# Patient Record
Sex: Male | Born: 1940 | Race: White | Hispanic: No | Marital: Married | Smoking: Never smoker
Health system: Southern US, Community
[De-identification: ages and names within clinical notes are randomized; demographics above are authoritative.]

## PROBLEM LIST (undated history)

## (undated) DIAGNOSIS — R079 Chest pain, unspecified: Secondary | ICD-10-CM

## (undated) DIAGNOSIS — N183 Chronic kidney disease, stage 3 unspecified: Secondary | ICD-10-CM

## (undated) DIAGNOSIS — J986 Disorders of diaphragm: Secondary | ICD-10-CM

## (undated) DIAGNOSIS — M25559 Pain in unspecified hip: Secondary | ICD-10-CM

## (undated) DIAGNOSIS — M4800 Spinal stenosis, site unspecified: Secondary | ICD-10-CM

## (undated) DIAGNOSIS — I219 Acute myocardial infarction, unspecified: Secondary | ICD-10-CM

## (undated) DIAGNOSIS — I340 Nonrheumatic mitral (valve) insufficiency: Secondary | ICD-10-CM

## (undated) DIAGNOSIS — Z7952 Long term (current) use of systemic steroids: Secondary | ICD-10-CM

## (undated) DIAGNOSIS — E119 Type 2 diabetes mellitus without complications: Secondary | ICD-10-CM

## (undated) DIAGNOSIS — M069 Rheumatoid arthritis, unspecified: Secondary | ICD-10-CM

## (undated) DIAGNOSIS — G8929 Other chronic pain: Secondary | ICD-10-CM

## (undated) DIAGNOSIS — I2510 Atherosclerotic heart disease of native coronary artery without angina pectoris: Secondary | ICD-10-CM

## (undated) DIAGNOSIS — I482 Chronic atrial fibrillation, unspecified: Secondary | ICD-10-CM

## (undated) DIAGNOSIS — J984 Other disorders of lung: Secondary | ICD-10-CM

## (undated) DIAGNOSIS — I25810 Atherosclerosis of coronary artery bypass graft(s) without angina pectoris: Secondary | ICD-10-CM

## (undated) DIAGNOSIS — T460X1A Poisoning by cardiac-stimulant glycosides and drugs of similar action, accidental (unintentional), initial encounter: Secondary | ICD-10-CM

## (undated) DIAGNOSIS — E039 Hypothyroidism, unspecified: Secondary | ICD-10-CM

## (undated) DIAGNOSIS — I447 Left bundle-branch block, unspecified: Secondary | ICD-10-CM

## (undated) DIAGNOSIS — I255 Ischemic cardiomyopathy: Secondary | ICD-10-CM

## (undated) DIAGNOSIS — I5042 Chronic combined systolic (congestive) and diastolic (congestive) heart failure: Secondary | ICD-10-CM

## (undated) DIAGNOSIS — E785 Hyperlipidemia, unspecified: Secondary | ICD-10-CM

## (undated) DIAGNOSIS — E875 Hyperkalemia: Secondary | ICD-10-CM

## (undated) DIAGNOSIS — J869 Pyothorax without fistula: Secondary | ICD-10-CM

## (undated) DIAGNOSIS — K219 Gastro-esophageal reflux disease without esophagitis: Secondary | ICD-10-CM

## (undated) HISTORY — DX: Atherosclerotic heart disease of native coronary artery without angina pectoris: I25.10

## (undated) HISTORY — DX: Other disorders of lung: J98.4

## (undated) HISTORY — DX: Disorders of diaphragm: J98.6

## (undated) HISTORY — DX: Hyperlipidemia, unspecified: E78.5

## (undated) HISTORY — DX: Chronic combined systolic (congestive) and diastolic (congestive) heart failure: I50.42

## (undated) HISTORY — PX: KNEE ARTHROSCOPY: SHX127

## (undated) HISTORY — DX: Nonrheumatic mitral (valve) insufficiency: I34.0

## (undated) HISTORY — DX: Pyothorax without fistula: J86.9

## (undated) HISTORY — DX: Poisoning by cardiac-stimulant glycosides and drugs of similar action, accidental (unintentional), initial encounter: T46.0X1A

## (undated) HISTORY — PX: PATELLA FRACTURE SURGERY: SHX735

## (undated) HISTORY — DX: Atherosclerosis of coronary artery bypass graft(s) without angina pectoris: I25.810

---

## 1989-08-14 HISTORY — PX: CATARACT EXTRACTION W/ INTRAOCULAR LENS  IMPLANT, BILATERAL: SHX1307

## 1989-08-14 HISTORY — PX: CARPAL TUNNEL RELEASE: SHX101

## 1992-12-14 DIAGNOSIS — J984 Other disorders of lung: Secondary | ICD-10-CM

## 1992-12-14 HISTORY — DX: Other disorders of lung: J98.4

## 1992-12-14 HISTORY — PX: LUNG SURGERY: SHX703

## 1994-07-23 ENCOUNTER — Encounter: Payer: Self-pay | Admitting: Pulmonary Disease

## 1996-12-14 DIAGNOSIS — I255 Ischemic cardiomyopathy: Secondary | ICD-10-CM

## 1996-12-14 HISTORY — DX: Ischemic cardiomyopathy: I25.5

## 1997-05-14 HISTORY — PX: CORONARY ARTERY BYPASS GRAFT: SHX141

## 1998-09-16 ENCOUNTER — Observation Stay (HOSPITAL_COMMUNITY): Admission: AD | Admit: 1998-09-16 | Discharge: 1998-09-17 | Payer: Self-pay | Admitting: Cardiology

## 1998-10-09 ENCOUNTER — Ambulatory Visit (HOSPITAL_COMMUNITY): Admission: RE | Admit: 1998-10-09 | Discharge: 1998-10-10 | Payer: Self-pay | Admitting: Cardiology

## 1999-02-28 ENCOUNTER — Ambulatory Visit (HOSPITAL_COMMUNITY): Admission: RE | Admit: 1999-02-28 | Discharge: 1999-02-28 | Payer: Self-pay | Admitting: Cardiology

## 1999-08-15 HISTORY — PX: TOTAL KNEE ARTHROPLASTY: SHX125

## 1999-10-29 ENCOUNTER — Ambulatory Visit (HOSPITAL_COMMUNITY): Admission: RE | Admit: 1999-10-29 | Discharge: 1999-10-29 | Payer: Self-pay

## 1999-11-03 ENCOUNTER — Ambulatory Visit (HOSPITAL_COMMUNITY): Admission: RE | Admit: 1999-11-03 | Discharge: 1999-11-03 | Payer: Self-pay

## 2001-12-26 ENCOUNTER — Encounter: Payer: Self-pay | Admitting: Specialist

## 2001-12-26 ENCOUNTER — Ambulatory Visit (HOSPITAL_COMMUNITY): Admission: RE | Admit: 2001-12-26 | Discharge: 2001-12-27 | Payer: Self-pay | Admitting: Specialist

## 2002-01-10 ENCOUNTER — Inpatient Hospital Stay (HOSPITAL_COMMUNITY): Admission: RE | Admit: 2002-01-10 | Discharge: 2002-01-14 | Payer: Self-pay | Admitting: Specialist

## 2003-06-07 ENCOUNTER — Encounter (INDEPENDENT_AMBULATORY_CARE_PROVIDER_SITE_OTHER): Payer: Self-pay | Admitting: *Deleted

## 2003-06-07 ENCOUNTER — Ambulatory Visit (HOSPITAL_COMMUNITY): Admission: RE | Admit: 2003-06-07 | Discharge: 2003-06-07 | Payer: Self-pay | Admitting: *Deleted

## 2004-03-18 ENCOUNTER — Ambulatory Visit (HOSPITAL_COMMUNITY): Admission: RE | Admit: 2004-03-18 | Discharge: 2004-03-18 | Payer: Self-pay | Admitting: Cardiology

## 2004-12-04 ENCOUNTER — Ambulatory Visit (HOSPITAL_COMMUNITY): Admission: RE | Admit: 2004-12-04 | Discharge: 2004-12-04 | Payer: Self-pay | Admitting: Cardiology

## 2007-07-27 ENCOUNTER — Ambulatory Visit (HOSPITAL_COMMUNITY): Admission: RE | Admit: 2007-07-27 | Discharge: 2007-07-27 | Payer: Self-pay | Admitting: *Deleted

## 2007-08-03 ENCOUNTER — Ambulatory Visit (HOSPITAL_COMMUNITY): Admission: RE | Admit: 2007-08-03 | Discharge: 2007-08-04 | Payer: Self-pay | Admitting: *Deleted

## 2008-12-03 ENCOUNTER — Inpatient Hospital Stay (HOSPITAL_COMMUNITY): Admission: EM | Admit: 2008-12-03 | Discharge: 2008-12-06 | Payer: Self-pay | Admitting: Emergency Medicine

## 2008-12-05 HISTORY — PX: CARDIAC CATHETERIZATION: SHX172

## 2009-04-26 ENCOUNTER — Encounter: Admission: RE | Admit: 2009-04-26 | Discharge: 2009-04-26 | Payer: Self-pay | Admitting: Cardiovascular Disease

## 2009-04-30 ENCOUNTER — Ambulatory Visit (HOSPITAL_COMMUNITY): Admission: RE | Admit: 2009-04-30 | Discharge: 2009-04-30 | Payer: Self-pay | Admitting: Cardiovascular Disease

## 2009-04-30 HISTORY — PX: CARDIAC CATHETERIZATION: SHX172

## 2009-07-08 ENCOUNTER — Encounter: Payer: Self-pay | Admitting: Pulmonary Disease

## 2009-08-21 ENCOUNTER — Inpatient Hospital Stay (HOSPITAL_COMMUNITY): Admission: AD | Admit: 2009-08-21 | Discharge: 2009-08-24 | Payer: Self-pay | Admitting: Cardiovascular Disease

## 2010-01-21 ENCOUNTER — Encounter: Admission: RE | Admit: 2010-01-21 | Discharge: 2010-01-21 | Payer: Self-pay | Admitting: Cardiovascular Disease

## 2010-01-27 ENCOUNTER — Encounter: Payer: Self-pay | Admitting: Pulmonary Disease

## 2010-01-30 ENCOUNTER — Ambulatory Visit (HOSPITAL_COMMUNITY): Admission: RE | Admit: 2010-01-30 | Discharge: 2010-01-30 | Payer: Self-pay | Admitting: Cardiovascular Disease

## 2010-01-30 ENCOUNTER — Encounter: Payer: Self-pay | Admitting: Pulmonary Disease

## 2010-02-11 ENCOUNTER — Encounter: Payer: Self-pay | Admitting: Pulmonary Disease

## 2010-02-19 DIAGNOSIS — I25719 Atherosclerosis of autologous vein coronary artery bypass graft(s) with unspecified angina pectoris: Secondary | ICD-10-CM | POA: Insufficient documentation

## 2010-02-19 DIAGNOSIS — I4821 Permanent atrial fibrillation: Secondary | ICD-10-CM

## 2010-02-19 DIAGNOSIS — I10 Essential (primary) hypertension: Secondary | ICD-10-CM

## 2010-02-19 DIAGNOSIS — E785 Hyperlipidemia, unspecified: Secondary | ICD-10-CM

## 2010-02-20 ENCOUNTER — Ambulatory Visit: Payer: Self-pay | Admitting: Pulmonary Disease

## 2010-02-20 DIAGNOSIS — R0602 Shortness of breath: Secondary | ICD-10-CM

## 2010-02-20 DIAGNOSIS — I219 Acute myocardial infarction, unspecified: Secondary | ICD-10-CM | POA: Insufficient documentation

## 2010-02-20 LAB — CONVERTED CEMR LAB: Pro B Natriuretic peptide (BNP): 455 pg/mL — ABNORMAL HIGH (ref 0.0–100.0)

## 2010-02-21 ENCOUNTER — Ambulatory Visit: Payer: Self-pay | Admitting: Cardiology

## 2010-02-27 ENCOUNTER — Encounter: Payer: Self-pay | Admitting: Pulmonary Disease

## 2010-03-14 ENCOUNTER — Telehealth (INDEPENDENT_AMBULATORY_CARE_PROVIDER_SITE_OTHER): Payer: Self-pay | Admitting: *Deleted

## 2010-03-28 ENCOUNTER — Inpatient Hospital Stay (HOSPITAL_COMMUNITY): Admission: RE | Admit: 2010-03-28 | Discharge: 2010-04-02 | Payer: Self-pay | Admitting: Cardiovascular Disease

## 2010-03-28 HISTORY — PX: CARDIAC CATHETERIZATION: SHX172

## 2010-04-01 HISTORY — PX: CARDIAC CATHETERIZATION: SHX172

## 2010-04-11 ENCOUNTER — Ambulatory Visit: Payer: Self-pay | Admitting: Pulmonary Disease

## 2010-05-28 ENCOUNTER — Inpatient Hospital Stay (HOSPITAL_COMMUNITY): Admission: EM | Admit: 2010-05-28 | Discharge: 2010-05-30 | Payer: Self-pay | Admitting: Emergency Medicine

## 2010-09-08 IMAGING — CR DG CHEST 1V PORT
1 series · 1 of 1 positions shown · non-contrast
Comparison: 08/03/2007.

CLINICAL DATA: Chest pain.  Shortness of breath.

PORTABLE CHEST - 1 VIEW

[AP]
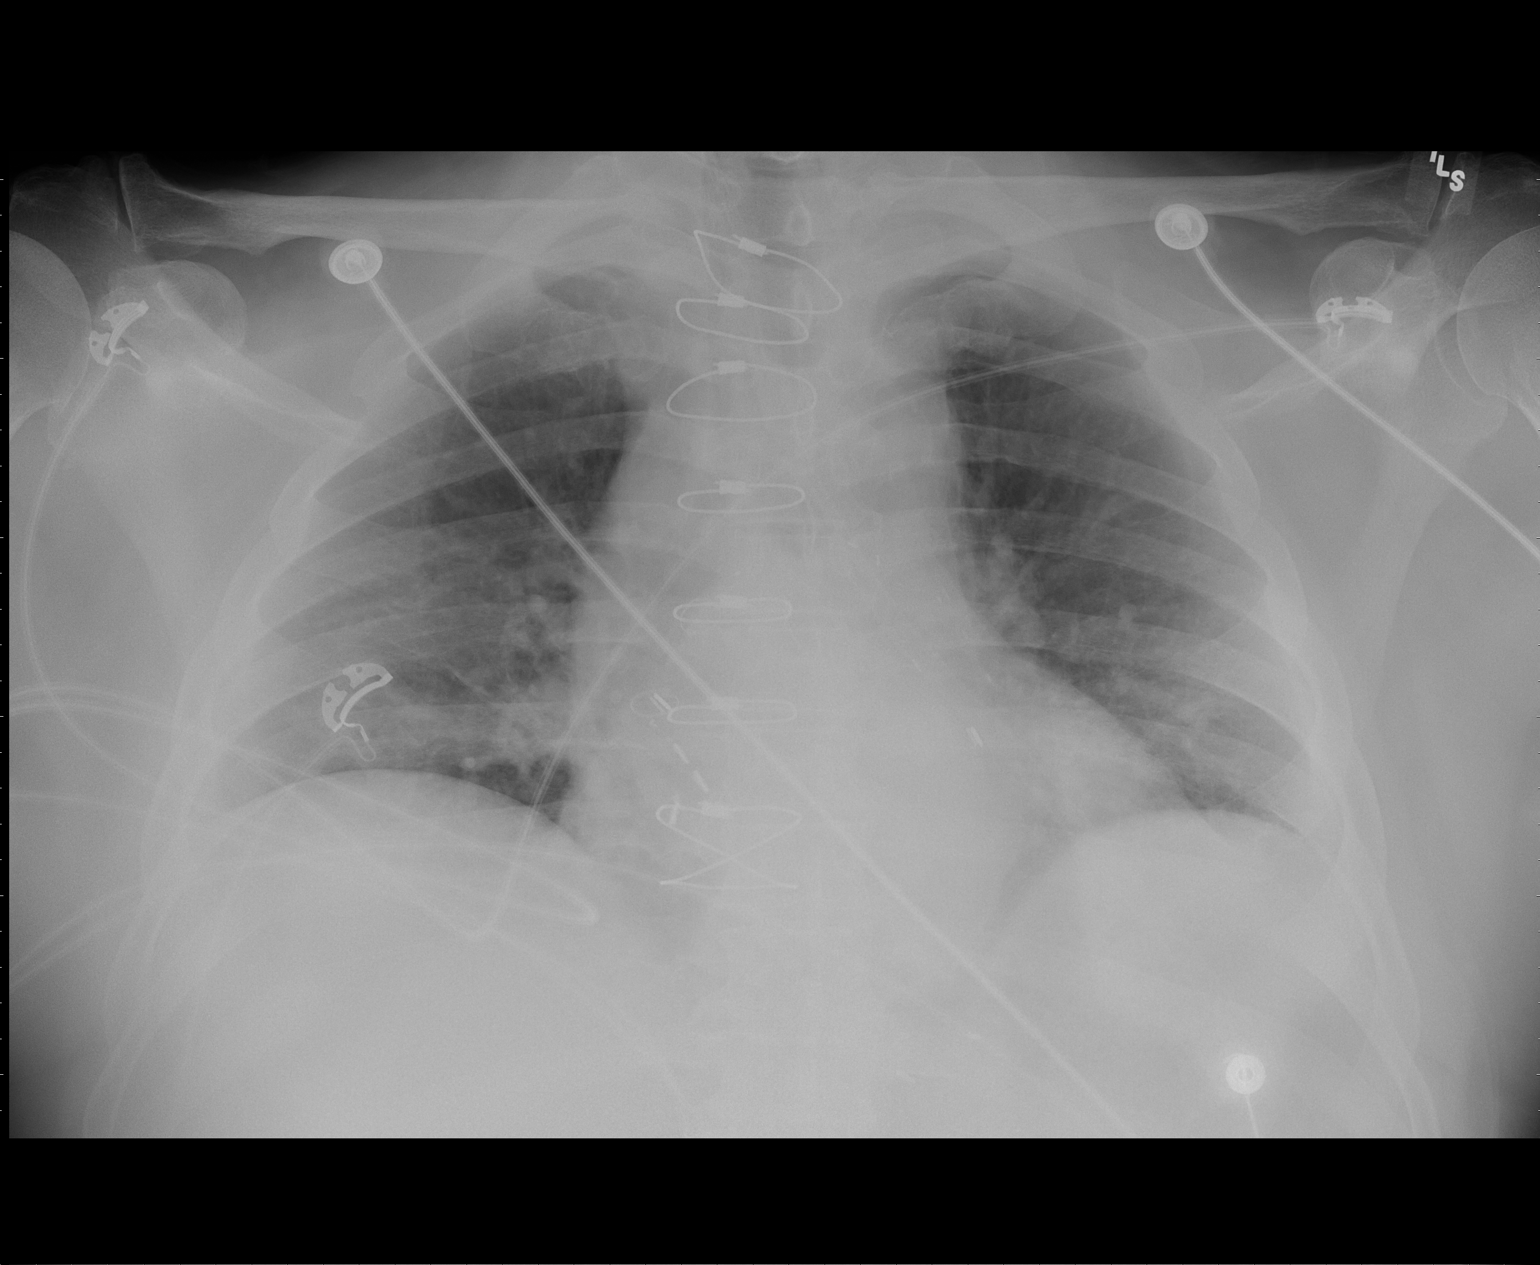

[1 of 1 positions shown; findings below may reference images not displayed]

FINDINGS: Lung volumes are low.  This exaggerates the heart size.
Mild cardiomegaly is present.  The patient is status post median
sternotomy for CABG.  Mild pulmonary vascular congestion is seen
without definite edema.  There is mild bibasilar atelectasis.
IMPRESSION: 1.  Low lung volumes.
2.  Cardiomegaly and mild bibasilar atelectasis.
3.  Pulmonary vascular congestion without frank edema.

## 2010-11-21 ENCOUNTER — Encounter: Payer: Self-pay | Admitting: Internal Medicine

## 2010-12-03 ENCOUNTER — Ambulatory Visit: Payer: Self-pay | Admitting: Internal Medicine

## 2010-12-03 ENCOUNTER — Encounter: Payer: Self-pay | Admitting: Internal Medicine

## 2011-01-15 NOTE — Progress Notes (Signed)
Summary: talk to nurse  Phone Note Call from Patient   Caller: Patient Call For: clance Summary of Call: dr clance have not called to discuss action that  he will be taking since test have been completed.  Initial call taken by: Rickard Patience,  March 14, 2010 9:44 AM  Follow-up for Phone Call        pt recently saw [facility] 02-20-2010.  Pt instructions state: 1)  will check your oxygen level overnight 2)  will check scan of your chest looking for "scar tissue" 3)  will check bloodwork today 4)  I will arrange followup once results are available.  ONO was done and is scanned into pt's chart. pt aware of results of ONO.  Pt also had CT chest 02-27-2010 and KC documented.... "have spoken with pt about ct.  no evidence for isld or pf he has tiny 3mm nodule in LUL, but is low risk will speak with Dr. [name] about possible right heart cath."  pt calling wanting to know what KC's next plans are.  Please advise.  Thanks. Aundra Millet Reynolds LPN  March 14, 1609 10:00 AM   Additional Follow-up for Phone Call Additional follow up Details #1::        i have been trying to get in touch with Dr. [name], and have not been successful.  will try again, and if no success will discuss with another cardiologist in that office Additional Follow-up by: Barbaraann Share MD,  March 14, 2010 5:31 PM    Additional Follow-up for Phone Call Additional follow up Details #2::    Spoke with pt and made aware of KC's response.  Pt verbalized understanding. Follow-up by: Vernie Murders,  March 14, 2010 5:39 PM   Appended Document: talk to nurse spoke with dr. [name].  He will call pt to arrange right heart cath. discussed with pt.

## 2011-01-15 NOTE — Miscellaneous (Signed)
Summary: ONO ok  Clinical Lists Changes  ONO on room air shows low sat of 85%, and he spent less than 2 min  less than or equal to sat of 88%. therefore, does not need nocturnal oxygen.  Appended Document: ONO ok please let pt know that oxygen level at night is ok.  Appended Document: ONO ok called and spoke with pt.  pt aware of ONO results.

## 2011-01-15 NOTE — Assessment & Plan Note (Signed)
Summary: consult for dyspnea   Copy to:  Nanetta Batty Primary Provider/Referring Provider:  Phylliss Bob  CC:  Pulmonary Consult.  History of Present Illness: The pt is a 70y/o male who I have been asked to see for dyspnea.  He has a h/o nonspecific pleuritis in 1994 with nondiagnostic vats biopsy, and was treated for possible autoimmune disease.  He is still taking methotrexate at this time.  He has been referred for evaluation of sob that occurs at exertion or sob, and has worsened over the past one year.  He describes a one block or less doe at a moderate pace, and will get winded bringing groceries in from the car.  He feels that he cannot get a deep breath, but denies any pleuritic chest pain.  He denies any cough or congestion.  His recent cxr shows poor depth inspiration, with chronically elevated left hemidiaphragm.  HIs pfts last month show no signficant obstruction, severe restriction, and a mild decrease in dlco.  He has had an echo last month with normal EF, mildly dilated LA, mild MR, and RV estimated pressure of 30-38mm.  His cath last year showed stable chronic CAD.  He has been on amiodarone in the past, and his medication was stopped the beginning of this month.  He is unable to tell when his rhythm is normal or abnormal.  He has noted worsening LE edema over the past 2 weeks, but his weight is neutral for over one year.    Preventive Screening-Counseling & Management  Alcohol-Tobacco     Smoking Status: never  Current Medications (verified): 1)  Prednisone 5 Mg Tabs (Prednisone) .... Take 2 Tabs By Mouth Daily 2)  Furosemide 40 Mg Tabs (Furosemide) .... Take 1 Tablet By Mouth Once A Day 3)  Klor-Con M20 20 Meq Cr-Tabs (Potassium Chloride Crys Cr) .... Take 1 Tablet By Mouth Once A Day 4)  Isosorbide Mononitrate Cr 60 Mg Xr24h-Tab (Isosorbide Mononitrate) .... Take 1 Tablet By Mouth Two Times A Day 5)  Methotrexate 2.5 Mg Tabs (Methotrexate Sodium) .... Take 6 Tabs By Mouth Each  Week 6)  Synthroid 150 Mcg Tabs (Levothyroxine Sodium) .... Take 1 Tablet By Mouth Once A Day 7)  Plavix 75 Mg Tabs (Clopidogrel Bisulfate) .... Take 1 Tablet By Mouth Once A Day 8)  Simvastatin 20 Mg Tabs (Simvastatin) .... Take 1 Tablet By Mouth Once A Day 9)  Metoprolol Tartrate 25 Mg Tabs (Metoprolol Tartrate) .... Take 1 Tablet By Mouth Two Times A Day 10)  Cyclobenzaprine Hcl 10 Mg Tabs (Cyclobenzaprine Hcl) .... Take 1 Tablet By Mouth Once A Day 11)  Aspirin Low Dose 81 Mg Tabs (Aspirin) .... Take 1 Tablet By Mouth Once A Day 12)  Folic Acid 400 Mcg Tabs (Folic Acid) .... Take 1 Tablet By Mouth Once A Day 13)  Fish Oil 1000 Mg Caps (Omega-3 Fatty Acids) .... Take 6 Tabs By Mouth Daily 14)  Ranexa 500 Mg Xr12h-Tab (Ranolazine) .... Take 1 Tablet By Mouth Two Times A Day 15)  Coumadin 10 Mg Tabs (Warfarin Sodium) .... Take As Directed 16)  Diltiazem Hcl Er Beads 180 Mg Xr24h-Cap (Diltiazem Hcl Er Beads) .... Take 1 Tablet By Mouth Once A Day 17)  Calcium 600 Mg Tabs (Calcium) .... Take 1 Tablet By Mouth Once A Day  Allergies (verified): 1)  ! Lisinopril 2)  ! Biaxin  Past History:  Past Medical History: H/o nonspecific pleuritis with negative vats biopsy  MYOCARDIAL INFARCTION (ICD-410.90) HYPERTENSION (ICD-401.9) DYSLIPIDEMIA (ICD-272.4)  CORONARY ARTERY DISEASE (ICD-414.00) ATRIAL FIBRILLATION, HX OF (ICD-V12.59) ISCHEMIC HEART DISEASE (ICD-414.9)  ?RA--sees [facility].  Past Surgical History: bypass grafting 1998 lung surgery 1994 B knee surgery  B carpal tunnel surgery B cataract removal   Family History: Reviewed history and no changes required. heart disease: father (CHF), mother, brother, sister rheumatism: son, mother, brother cancer: brother (unsure what kind), son (skin)   Social History: Reviewed history from 02/19/2010 and no changes required. pt is married. Patient never smoked.  pt is a retired Curator.  Smoking Status:  never  Vital Signs:  Patient  profile:   70 year old male Height:      72 inches Weight:      232.50 pounds BMI:     31.65 O2 Sat:      96 % on Room air Temp:     97.5 degrees F oral Pulse rate:   95 / minute BP sitting:   124 / 72  (right arm) Cuff size:   large  Vitals Entered By: Arman Filter LPN (February 20, 2010 10:47 AM)  O2 Flow:  Room air  Serial Vital Signs/Assessments:  Comments: 11:41 AM Ambulatory Pulse Oximetry  Resting; HR__70___    02 Sat__94%ra___  Lap1 (185 feet)   HR_123____   02 Sat___95%ra__ Lap2 (185 feet)   HR__126___   02 Sat___94%ra__    Lap3 (185 feet)   HR_____   02 Sat_____  ___Test Completed without Difficulty _X__Test Stopped due to: pt c/o increased sob.   Aundra Millet Reynolds LPN  February 20, 2010 11:59 AM    By: Arman Filter LPN   CC: Pulmonary Consult Comments Medications reviewed with patient Arman Filter LPN  February 20, 2010 10:48 AM    Physical Exam  General:  ow male in nad Eyes:  PERRLA and EOMI.   Nose:  patent without discharge Mouth:  mild elongation of soft palate and uvula Neck:  no jvd, tmg, LN Lungs:  decreased bs left base, bibasilar crackles. no wheezing or rhonchi Heart:  rrr, no mrg Abdomen:  soft and nontender, bs+ Extremities:  minimal ankle edema, no cyanosis pulses intact distally Neurologic:  alert and oriented, moves all 4.   Impression & Recommendations:  Problem # 1:  DYSPNEA (ICD-786.05) The pt has multiple possible etiologies for his dypsnea.  He has known CAD and afib, he has been on amiodarone recently, he has known autoimmune disease that may be causing inflammatory lung disease, he has evidence for elevated pulmonary artery pressures that may be related to autoimmune disease or from left sided pressure overload, he is overweight and deconditioned, and finally has underlying thyroid disease with control issues in the past.  His cxr recently shows no obvious ISLD, but I think he needs a HRCT to evaluate for occult fibrosis and  alveolitis.  He does not desaturate today with brisk walking, but does get tachycardic.  His pfts show primarily restriction, and his cxr has always shown small lung volumes/shallow depth of inspiration (some of which is postop in the case of the left hemidiaphragm).  Will check sed rate, BNP, ct chest, and overnight oximetry.  He may need right heart cath is nothing is found to evaluate right and left sided pressures.  Medications Added to Medication List This Visit: 1)  Furosemide 40 Mg Tabs (Furosemide) .... Take 1 tablet by mouth once a day 2)  Calcium 600 Mg Tabs (Calcium) .... Take 1 tablet by mouth once a day  Other Orders: Consultation Level IV (65784) DME  Referral (DME) Radiology Referral (Radiology) TLB-BNP (B-Natriuretic Peptide) (83880-BNPR) TLB-Sedimentation Rate (ESR) (85652-ESR)  Patient Instructions: 1)  will check your oxygen level overnight 2)  will check scan of your chest looking for "scar tissue" 3)  will check bloodwork today 4)  I will arrange followup once results are available.

## 2011-01-15 NOTE — Letter (Signed)
Summary: [facility]   Imported By: Lester [place] 03/06/2010 10:53:18  _____________________________________________________________________  External Attachment:    Type:   Image     Comment:   External Document

## 2011-01-15 NOTE — Assessment & Plan Note (Signed)
Summary: ov to discuss test results/mg   Copy to:  Nanetta Batty Primary Provider/Referring Provider:  Shary Decamp [facility])  CC:  Pt is here to discuss test results.  .  History of Present Illness: The pt comes in today for discussion of his various test results, as part of a w/u for dyspnea.  He has known chronic left hemidiaphragm elevation that I suspect is due to paralysis from prior surgeries, with restriction on his pfts.  He has had right heart cath which showed PCWP 18, very mild pulmonary hypertension with PVR less than one wood unit (therefore pulmonary venous rather than arterial), and a left heart cath which showed a decline in the pt's EF in one year from 50% to 35%.  The pt has also had a HRCT with no evidence for ISLD or other abnormality.  I have gone over the above studies in detail with the pt and his wife.  Current Medications (verified): 1)  Prednisone 5 Mg Tabs (Prednisone) .... Take 2 Tabs By Mouth Daily 2)  Furosemide 20 Mg Tabs (Furosemide) .... Take 1 Tablet By Mouth Once A Day 3)  Isosorbide Mononitrate Cr 60 Mg Xr24h-Tab (Isosorbide Mononitrate) .... Take 1 Tablet By Mouth Once A Day 4)  Methotrexate 2.5 Mg Tabs (Methotrexate Sodium) .... Take 6 Tabs By Mouth Each Week 5)  Synthroid 150 Mcg Tabs (Levothyroxine Sodium) .... Take 1 Tablet By Mouth Once A Day 6)  Plavix 75 Mg Tabs (Clopidogrel Bisulfate) .... Take 1 Tablet By Mouth Once A Day 7)  Simvastatin 20 Mg Tabs (Simvastatin) .... Take 1 Tablet By Mouth Once A Day 8)  Metoprolol Tartrate 25 Mg Tabs (Metoprolol Tartrate) .... Take 1 Tablet By Mouth Two Times A Day 9)  Cyclobenzaprine Hcl 10 Mg Tabs (Cyclobenzaprine Hcl) .... Take 1 Tablet By Mouth Once A Day 10)  Aspirin Low Dose 81 Mg Tabs (Aspirin) .... Take 1 Tablet By Mouth Once A Day 11)  Folic Acid 400 Mcg Tabs (Folic Acid) .... Take 1 Tablet By Mouth Once A Day 12)  Fish Oil 1000 Mg Caps (Omega-3 Fatty Acids) .... Take 3  Tabs By Mouth  Daily 13)  Ranexa 500 Mg Xr12h-Tab (Ranolazine) .... Take 1 Tablet By Mouth Two Times A Day 14)  Coumadin 10 Mg Tabs (Warfarin Sodium) .... Take As Directed 15)  Diltiazem Hcl Er Beads 180 Mg Xr24h-Cap (Diltiazem Hcl Er Beads) .... Take 1 Tablet By Mouth Once A Day 16)  Calcium 600 Mg Tabs (Calcium) .... Take 1 Tablet By Mouth Once A Day  Allergies (verified): 1)  ! Lisinopril 2)  ! Biaxin  Vital Signs:  Patient profile:   70 year old male Height:      72 inches Weight:      232.38 pounds BMI:     31.63 O2 Sat:      97 % on Room air Pulse rate:   81 / minute BP sitting:   106 / 62  (left arm) Cuff size:   regular  Vitals Entered By: Arman Filter LPN (April 11, 2010 11:47 AM)  O2 Flow:  Room air CC: Pt is here to discuss test results.   Comments Medications reviewed with patient Arman Filter LPN  April 11, 2010 11:47 AM    Physical Exam  General:  30 male in nad   Impression & Recommendations:  Problem # 1:  DYSPNEA (ICD-786.05)  I think the pt's dyspnea is due to his obesity, deconditioning, dysfunctional left hemidiaphragm, new cardiomyopathy,  and mildly elevated left sided filling pressures.  He does not have any evidence of airways disease or intrinsic lung disease.  He is also concerned about his thyroid medications, and thinks this is contributing as well.  He will need to discuss all of this with his primary md.  I have nothing to offer him for treatment from a pulmonary standpoint.  Time spent with pt today was .  Medications Added to Medication List This Visit: 1)  Furosemide 20 Mg Tabs (Furosemide) .... Take 1 tablet by mouth once a day 2)  Isosorbide Mononitrate Cr 60 Mg Xr24h-tab (Isosorbide mononitrate) .... Take 1 tablet by mouth once a day 3)  Fish Oil 1000 Mg Caps (Omega-3 fatty acids) .... Take 3  tabs by mouth daily  Other Orders: Est. Patient Level IV (40981)  Patient Instructions: 1)  will send a note to your primary md and Dr. [name]  outlining my findings. 2)  work on weight loss and conditioning. 3)  would get an apptm with your primary md to discuss your thyroid medicine, and cardiac issues.

## 2011-01-15 NOTE — Procedures (Signed)
Summary: [facility]  [facility]   Imported By: Lester [place] 03/04/2010 10:37:48  _____________________________________________________________________  External Attachment:    Type:   Image     Comment:   External Document

## 2011-01-15 NOTE — Letter (Signed)
Summary: [facility]  [facility]   Imported By: Lester [place] 03/06/2010 10:55:57  _____________________________________________________________________  External Attachment:    Type:   Image     Comment:   External Document

## 2011-01-15 NOTE — Assessment & Plan Note (Signed)
Summary: nep/afib/eval for ablation   Visit Type:  Initial Consult Referring Provider:  Nanetta Batty Primary Provider:  Shary Decamp [facility])   History of Present Illness: [name] is a pleasant 70 yo WM with a h/o CAD s/p CABG, rheumatoid arthritis/ chronic lung disease, and persistent atrial fibrillation and atrial flutter who presents today for EP consultation.  He reports initially being diagnosed with atrial fibrillation 2009 after presenting with afib and RVR.  He was placed on amiodarone but continued to have atrial fibrillation.  He has not been previously cardioverted.  The patient has difficulty telling when he is in sinus or afib but does feel that he has a "harder time breathing" when in afib. He has a longtime history of chronic lung disease.   He has a h/o nonspecific pleuritis in 1994 with nondiagnostic vats biopsy, and was treated for possible autoimmune disease.  He is still taking methotrexate and prednisone at this time.  He has been followed by Dr Shelle Iron.  He has chronic diaphragmatic paralysis.  His SOB is not felt to be related to amiodarone therapy.  He describes a one block or less doe at a moderate pace, and will get winded bringing groceries in from the car.  He feels that he cannot get a deep breath, but denies any pleuritic chest pain.  He denies any cough or congestion.  His recent cxr shows poor depth inspiration, with chronically elevated left hemidiaphragm.  HIs pfts last month show no signficant obstruction, severe restriction, and a mild decrease in dlco.  He has had an echo last month with normal EF, mildly dilated LA, mild Daniel, and RV estimated pressure of 30-54mm.  His cath last year showed stable chronic CAD.     Current Medications (verified): 1)  Prednisone 5 Mg Tabs (Prednisone) .... Take 2 Tabs By Mouth Daily 2)  Furosemide 20 Mg Tabs (Furosemide) .... Take 1 Tablet By Mouth Once A Day 3)  Isosorbide Mononitrate Cr 60 Mg Xr24h-Tab (Isosorbide  Mononitrate) .... Take 1 Tablet By Mouth Once A Day 4)  Methotrexate 2.5 Mg Tabs (Methotrexate Sodium) .... Take 6 Tabs By Mouth Each Week 5)  Levothroid 175 Mcg Tabs (Levothyroxine Sodium) .... Once Daily 6)  Plavix 75 Mg Tabs (Clopidogrel Bisulfate) .... Take 1 Tablet By Mouth Once A Day 7)  Simvastatin 20 Mg Tabs (Simvastatin) .... Take 1 Tablet By Mouth Once A Day 8)  Metoprolol Tartrate 25 Mg Tabs (Metoprolol Tartrate) .... Take 1 Tablet By Mouth Two Times A Day 9)  Cyclobenzaprine Hcl 10 Mg Tabs (Cyclobenzaprine Hcl) .... Take 1 Tablet By Mouth Once A Day 10)  Aspirin Low Dose 81 Mg Tabs (Aspirin) .... Take 1 Tablet By Mouth Once A Day 11)  Folic Acid 400 Mcg Tabs (Folic Acid) .... Take 1 Tablet By Mouth Once A Day 12)  Fish Oil 1000 Mg Caps (Omega-3 Fatty Acids) .... Take 3  Tabs By Mouth Daily 13)  Ranexa 500 Mg Xr12h-Tab (Ranolazine) .... Take 1 Tablet By Mouth Two Times A Day 14)  Coumadin 10 Mg Tabs (Warfarin Sodium) .... Take As Directed 15)  Diltiazem Hcl Er Beads 240 Mg Xr24h-Cap (Diltiazem Hcl Er Beads) .... Take One Capsule By Mouth Daily 16)  Calcium 600 Mg Tabs (Calcium) .... Take 1 Tablet By Mouth Once A Day 17)  Lipitor 20 Mg Tabs (Atorvastatin Calcium) .... Take 1/2  Tablet By Mouth Daily. 18)  Amiodarone Hcl 200 Mg Tabs (Amiodarone Hcl) .... Take One Tablet By Mouth  Daily 19)  Pantoprazole Sodium 40 Mg Tbec (Pantoprazole Sodium) .... Once Daily 20)  Nitrostat 0.4 Mg Subl (Nitroglycerin) .[name].. 1 Tablet Under Tongue At Onset of Chest Pain; You May Repeat Every 5 Minutes For Up To 3 Doses.  Allergies: 1)  ! Lisinopril 2)  ! 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 heart disease: father (CHF), mother, brother, sister rheumatism: son, mother, brother cancer: brother (unsure what kind), son (skin)   Social History: pt is married and lives in [place]. Patient never smoked.  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    Review of Systems       All systems are reviewed and negative except as listed in the HPI.   Vital Signs:  Patient profile:   70 year old male Height:      72 inches Weight:      232 pounds BMI:     31.58 O2 Sat:      93 % Pulse rate:   85 / minute BP sitting:   120 / 70  (left arm)  Vitals Entered By: Laurance Flatten CMA (December 03, 2010 8:39 AM)  Physical Exam  General:  elderly, NAD Head:  normocephalic and atraumatic Eyes:  PERRLA/EOM intact; conjunctiva and lids normal. Mouth:  Teeth, gums and palate normal. Oral mucosa normal. Neck:  supple, JVP 8cm Lungs:  decreased bs left base, bibasilar crackles. no wheezing or rhonchi Heart:  iRRR, no m/r/g Abdomen:  Bowel sounds positive; abdomen soft and non-tender without masses, organomegaly, or hernias noted. No hepatosplenomegaly. Msk:  walks slowly with a cane Extremities:  No clubbing or cyanosis.  +1 BLE edema Neurologic:  Alert and oriented x 3. Skin:  Intact without lesions or rashes. Psych:  Normal affect.   Echocardiogram  Procedure date:  01/27/2010  Findings:      Interpretation Summary:  A complete two dementional transthoracic echo was performed. There was mild concentric left ventricular hypertrophy. The LA is mildly Dilated. There is mild mitrial annular calcification. There is mild mitrial regurgitation. There is mild tricuspid regurgitation. Right ventricular  systolic pressure is elevated a 30-87mmHg. Trace aortic regurgitation. There is aortic root sclerosis/calcification. Mild aortic root dilatation. Pericardial fat pad noted  Left ventricular systolic function is borderline reduced. EF=50-55%.  Judene Companion, MD, [facility]  Nuclear Study  Procedure date:  04/24/2009  Findings:      There is amoderate perfusion defect due to infarct/scarwith mild to moderate periinfarct ischemia seen in the basal inferolateral, Mild inferolateral and Apical lateral region(s). Abnormal pharmacologic nuclear stress test. Abnormal myocardial  perfusion imaging.  Wall motion abnormalities: Global left ventricular systolic function is moderately reduced. The post-stress ejection fraction is 44%. EKG is negative for ischemia. No ECG changes. Abnormal Myocardial Perfusion Study. Compared to the previous study, there is no significant change. Abnormal myocardial perfusion imaging. This is  a low risk scan. Clinical correlation recommended.  Ritta Slot, MD  Cardiac Cath  Procedure date:  04/02/2010  Findings:      Impression: Daniel. [name] anatomy is  essentially unchanged from his cath a year ago. I do not think it explains his progressive dyspnea nor do his  right pressures performed at the end of last week. plans will be for continued medical therapy.  Nanetta Batty, MD      EKG  Procedure date:  12/03/2010  Findings:      afib, V rate 78, LAD LVH  Impression & Recommendations:  Problem # 1:  ATRIAL FIBRILLATION, HX OF (ICD-V12.59) The patient has persistent atrial fibrillation refractory to medical therapy with amiodarone.  The cause for his dypsnea is chronic and unclear but not likely afib.  He is unaware that he is in afib today.  He is appropriately anticoagulated with coumadin. Therapeutic strategies for afib including medicine and ablation were discussed in detail with the patient today. Risk, benefits, and alternatives to EP study and  radiofrequency ablation for afib were also discussed in detail today. Given his underlying lung disease and chronic steroid use which are likely his triggers for afib, his anticipated success rate from ablation is likely 50-60%.  The patient is very clear that he wishes to decline ablation at this time. I would therefore recommend rate control longterm. I have increased metoprolol to 50mg  two times a day today.  Continue coumadin longterm. I would consider stopping amiodarone given that he does not appear to be receiving benefit from this medicine.  I will defer this decision to Dr [name].  Problem # 2:  DYSPNEA (ICD-786.05) multifactoral I would discontinue amiodarone longterm  Patient Instructions: 1)  Your physician recommends that you schedule a follow-up appointment as needed 2)  Your physician has recommended you make the following change in your medication: increase Metoprolol to 25mg  two times a day

## 2011-01-21 NOTE — Letter (Signed)
Summary: [facility] 2010-2011  [facility] 2010-2011   Imported By: Marylou Mccoy 01/13/2011 11:42:43  _____________________________________________________________________  External Attachment:    Type:   Image     Comment:   External Document

## 2011-03-01 LAB — CARDIAC PANEL(CRET KIN+CKTOT+MB+TROPI)
CK, MB: 7 ng/mL (ref 0.3–4.0)
Total CK: 62 U/L (ref 7–232)
Troponin I: 0.66 ng/mL (ref 0.00–0.06)
Troponin I: 0.88 ng/mL (ref 0.00–0.06)

## 2011-03-01 LAB — COMPREHENSIVE METABOLIC PANEL
Alkaline Phosphatase: 47 U/L (ref 39–117)
BUN: 14 mg/dL (ref 6–23)
GFR calc non Af Amer: >60 mL/min (ref >60)
Glucose, Bld: 217 mg/dL — ABNORMAL HIGH (ref 70–99)
Potassium: 4.1 mEq/L (ref 3.5–5.1)
Total Protein: 6 g/dL (ref 6.0–8.3)

## 2011-03-01 LAB — DIFFERENTIAL
Basophils Absolute: 0 10*3/uL (ref 0.0–0.1)
Basophils Relative: 0 % (ref 0–1)
Monocytes Relative: 7 % (ref 3–12)
Neutro Abs: 8.2 10*3/uL — ABNORMAL HIGH (ref 1.7–7.7)
Neutrophils Relative %: 83 % — ABNORMAL HIGH (ref 43–77)

## 2011-03-01 LAB — LIPID PANEL
Cholesterol: 100 mg/dL (ref 0–200)
HDL: 29 mg/dL — ABNORMAL LOW (ref >39)
LDL Cholesterol: 26 mg/dL (ref 0–99)
Total CHOL/HDL Ratio: 3.2 RATIO
VLDL: 38 mg/dL (ref 0–40)

## 2011-03-01 LAB — HEMOGLOBIN A1C
Hgb A1c MFr Bld: 7.6 % — ABNORMAL HIGH (ref <5.7)
Mean Plasma Glucose: 171 mg/dL — ABNORMAL HIGH (ref <117)

## 2011-03-01 LAB — BASIC METABOLIC PANEL
BUN: 9 mg/dL (ref 6–23)
Chloride: 104 mEq/L (ref 96–112)
Chloride: 107 mEq/L (ref 96–112)
Creatinine, Ser: 1.04 mg/dL (ref 0.4–1.5)
GFR calc Af Amer: >60 mL/min (ref >60)
GFR calc non Af Amer: >60 mL/min (ref >60)
GFR calc non Af Amer: >60 mL/min (ref >60)
Potassium: 4.9 mEq/L (ref 3.5–5.1)
Sodium: 139 mEq/L (ref 135–145)

## 2011-03-01 LAB — CBC
HCT: 42.6 % (ref 39.0–52.0)
HCT: 42.7 % (ref 39.0–52.0)
Hemoglobin: 14.2 g/dL (ref 13.0–17.0)
Hemoglobin: 14.3 g/dL (ref 13.0–17.0)
MCHC: 33.7 g/dL (ref 30.0–36.0)
MCV: 106.5 fL — ABNORMAL HIGH (ref 78.0–100.0)
MCV: 106.7 fL — ABNORMAL HIGH (ref 78.0–100.0)
Platelets: 130 10*3/uL — ABNORMAL LOW (ref 150–400)
Platelets: 145 10*3/uL — ABNORMAL LOW (ref 150–400)
RBC: 4 MIL/uL — ABNORMAL LOW (ref 4.22–5.81)
RDW: 17.5 % — ABNORMAL HIGH (ref 11.5–15.5)
RDW: 17.7 % — ABNORMAL HIGH (ref 11.5–15.5)
WBC: 10.5 10*3/uL (ref 4.0–10.5)
WBC: 8.6 10*3/uL (ref 4.0–10.5)

## 2011-03-01 LAB — T4, FREE: Free T4: 1.43 ng/dL (ref 0.80–1.80)

## 2011-03-01 LAB — CK TOTAL AND CKMB (NOT AT ARMC)
Relative Index: INVALID (ref 0.0–2.5)
Total CK: 42 U/L (ref 7–232)

## 2011-03-01 LAB — GLUCOSE, CAPILLARY
Glucose-Capillary: 166 mg/dL — ABNORMAL HIGH (ref 70–99)
Glucose-Capillary: 168 mg/dL — ABNORMAL HIGH (ref 70–99)

## 2011-03-01 LAB — PROTIME-INR
INR: 2.58 — ABNORMAL HIGH (ref 0.00–1.49)
INR: 3.17 — ABNORMAL HIGH (ref 0.00–1.49)
Prothrombin Time: 27.5 seconds — ABNORMAL HIGH (ref 11.6–15.2)
Prothrombin Time: 28.6 seconds — ABNORMAL HIGH (ref 11.6–15.2)
Prothrombin Time: 32.3 seconds — ABNORMAL HIGH (ref 11.6–15.2)

## 2011-03-01 LAB — TROPONIN I: Troponin I: 0.03 ng/mL (ref 0.00–0.06)

## 2011-03-01 LAB — POCT CARDIAC MARKERS
CKMB, poc: <1 ng/mL — ABNORMAL LOW (ref 1.0–8.0)
Troponin i, poc: <0.05 ng/mL (ref 0.00–0.09)

## 2011-03-01 LAB — MAGNESIUM: Magnesium: 2.2 mg/dL (ref 1.5–2.5)

## 2011-03-01 LAB — BRAIN NATRIURETIC PEPTIDE: Pro B Natriuretic peptide (BNP): 379 pg/mL — ABNORMAL HIGH (ref 0.0–100.0)

## 2011-03-03 LAB — GLUCOSE, CAPILLARY
Glucose-Capillary: 141 mg/dL — ABNORMAL HIGH (ref 70–99)
Glucose-Capillary: 153 mg/dL — ABNORMAL HIGH (ref 70–99)
Glucose-Capillary: 163 mg/dL — ABNORMAL HIGH (ref 70–99)
Glucose-Capillary: 176 mg/dL — ABNORMAL HIGH (ref 70–99)
Glucose-Capillary: 179 mg/dL — ABNORMAL HIGH (ref 70–99)
Glucose-Capillary: 180 mg/dL — ABNORMAL HIGH (ref 70–99)
Glucose-Capillary: 187 mg/dL — ABNORMAL HIGH (ref 70–99)
Glucose-Capillary: 188 mg/dL — ABNORMAL HIGH (ref 70–99)
Glucose-Capillary: 198 mg/dL — ABNORMAL HIGH (ref 70–99)
Glucose-Capillary: 198 mg/dL — ABNORMAL HIGH (ref 70–99)

## 2011-03-03 LAB — HEPARIN LEVEL (UNFRACTIONATED)
Heparin Unfractionated: 0.24 IU/mL — ABNORMAL LOW (ref 0.30–0.70)
Heparin Unfractionated: 0.35 IU/mL (ref 0.30–0.70)
Heparin Unfractionated: 0.4 IU/mL (ref 0.30–0.70)

## 2011-03-03 LAB — PROTIME-INR
INR: 1.07 (ref 0.00–1.49)
INR: 1.07 (ref 0.00–1.49)
INR: 1.16 (ref 0.00–1.49)
Prothrombin Time: 13.8 seconds (ref 11.6–15.2)
Prothrombin Time: 13.8 seconds (ref 11.6–15.2)
Prothrombin Time: 14.7 seconds (ref 11.6–15.2)

## 2011-03-03 LAB — BASIC METABOLIC PANEL
BUN: 15 mg/dL (ref 6–23)
CO2: 25 mEq/L (ref 19–32)
CO2: 27 mEq/L (ref 19–32)
Calcium: 8.7 mg/dL (ref 8.4–10.5)
Chloride: 101 mEq/L (ref 96–112)
Chloride: 106 mEq/L (ref 96–112)
Creatinine, Ser: 1.03 mg/dL (ref 0.4–1.5)
Creatinine, Ser: 1.16 mg/dL (ref 0.4–1.5)
GFR calc Af Amer: >60 mL/min (ref >60)
GFR calc Af Amer: >60 mL/min (ref >60)
GFR calc non Af Amer: 58 mL/min — ABNORMAL LOW (ref >60)
GFR calc non Af Amer: >60 mL/min (ref >60)
Glucose, Bld: 148 mg/dL — ABNORMAL HIGH (ref 70–99)
Glucose, Bld: 160 mg/dL — ABNORMAL HIGH (ref 70–99)
Potassium: 4.1 mEq/L (ref 3.5–5.1)
Potassium: 4.6 mEq/L (ref 3.5–5.1)
Potassium: 5 mEq/L (ref 3.5–5.1)
Sodium: 135 mEq/L (ref 135–145)
Sodium: 136 mEq/L (ref 135–145)
Sodium: 138 mEq/L (ref 135–145)

## 2011-03-03 LAB — CBC
HCT: 37.1 % — ABNORMAL LOW (ref 39.0–52.0)
HCT: 38.3 % — ABNORMAL LOW (ref 39.0–52.0)
HCT: 39.8 % (ref 39.0–52.0)
Hemoglobin: 12.8 g/dL — ABNORMAL LOW (ref 13.0–17.0)
Hemoglobin: 13.5 g/dL (ref 13.0–17.0)
Hemoglobin: 13.8 g/dL (ref 13.0–17.0)
Hemoglobin: 13.9 g/dL (ref 13.0–17.0)
MCHC: 34.5 g/dL (ref 30.0–36.0)
MCHC: 34.5 g/dL (ref 30.0–36.0)
MCHC: 34.6 g/dL (ref 30.0–36.0)
MCV: 107.9 fL — ABNORMAL HIGH (ref 78.0–100.0)
Platelets: 144 10*3/uL — ABNORMAL LOW (ref 150–400)
RBC: 3.56 MIL/uL — ABNORMAL LOW (ref 4.22–5.81)
RBC: 3.66 MIL/uL — ABNORMAL LOW (ref 4.22–5.81)
RDW: 17.9 % — ABNORMAL HIGH (ref 11.5–15.5)
RDW: 18 % — ABNORMAL HIGH (ref 11.5–15.5)
RDW: 18.8 % — ABNORMAL HIGH (ref 11.5–15.5)
WBC: 10.8 10*3/uL — ABNORMAL HIGH (ref 4.0–10.5)
WBC: 7.4 10*3/uL (ref 4.0–10.5)

## 2011-03-03 LAB — POCT I-STAT 3, VENOUS BLOOD GAS (G3P V)
Acid-Base Excess: 1 mmol/L (ref 0.0–2.0)
Acid-Base Excess: 3 mmol/L — ABNORMAL HIGH (ref 0.0–2.0)
Bicarbonate: 27.4 mEq/L — ABNORMAL HIGH (ref 20.0–24.0)
Bicarbonate: 28.6 mEq/L — ABNORMAL HIGH (ref 20.0–24.0)

## 2011-03-03 LAB — BRAIN NATRIURETIC PEPTIDE: Pro B Natriuretic peptide (BNP): 106 pg/mL — ABNORMAL HIGH (ref 0.0–100.0)

## 2011-03-03 LAB — HEMOGLOBIN A1C
Hgb A1c MFr Bld: 8.5 % — ABNORMAL HIGH (ref <5.7)
Mean Plasma Glucose: 197 mg/dL — ABNORMAL HIGH (ref <117)

## 2011-03-20 LAB — PROTIME-INR
INR: 2 — ABNORMAL HIGH (ref 0.00–1.49)
INR: 3.4 — ABNORMAL HIGH (ref 0.00–1.49)
Prothrombin Time: 19 seconds — ABNORMAL HIGH (ref 11.6–15.2)
Prothrombin Time: 22.9 seconds — ABNORMAL HIGH (ref 11.6–15.2)

## 2011-03-20 LAB — DIFFERENTIAL
Basophils Relative: 0 % (ref 0–1)
Eosinophils Absolute: 0 10*3/uL (ref 0.0–0.7)
Eosinophils Absolute: 0.1 10*3/uL (ref 0.0–0.7)
Eosinophils Relative: 0 % (ref 0–5)
Eosinophils Relative: 1 % (ref 0–5)
Lymphocytes Relative: 8 % — ABNORMAL LOW (ref 12–46)
Lymphs Abs: 0.8 10*3/uL (ref 0.7–4.0)
Lymphs Abs: 0.9 10*3/uL (ref 0.7–4.0)
Monocytes Absolute: 0.8 10*3/uL (ref 0.1–1.0)
Neutrophils Relative %: 80 % — ABNORMAL HIGH (ref 43–77)

## 2011-03-20 LAB — CBC
HCT: 42.8 % (ref 39.0–52.0)
HCT: 45 % (ref 39.0–52.0)
HCT: 46.4 % (ref 39.0–52.0)
Hemoglobin: 14.3 g/dL (ref 13.0–17.0)
Hemoglobin: 15.1 g/dL (ref 13.0–17.0)
MCHC: 33.3 g/dL (ref 30.0–36.0)
MCHC: 33.5 g/dL (ref 30.0–36.0)
MCHC: 33.6 g/dL (ref 30.0–36.0)
MCHC: 33.6 g/dL (ref 30.0–36.0)
MCV: 106.3 fL — ABNORMAL HIGH (ref 78.0–100.0)
MCV: 107.1 fL — ABNORMAL HIGH (ref 78.0–100.0)
Platelets: 183 10*3/uL (ref 150–400)
Platelets: 183 10*3/uL (ref 150–400)
Platelets: 194 10*3/uL (ref 150–400)
RBC: 4.23 MIL/uL (ref 4.22–5.81)
RBC: 4.38 MIL/uL (ref 4.22–5.81)
RDW: 16.3 % — ABNORMAL HIGH (ref 11.5–15.5)
RDW: 16.6 % — ABNORMAL HIGH (ref 11.5–15.5)
RDW: 16.8 % — ABNORMAL HIGH (ref 11.5–15.5)
WBC: 10.2 10*3/uL (ref 4.0–10.5)
WBC: 8.2 10*3/uL (ref 4.0–10.5)

## 2011-03-20 LAB — COMPREHENSIVE METABOLIC PANEL
ALT: 28 U/L (ref 0–53)
AST: 23 U/L (ref 0–37)
Albumin: 4.2 g/dL (ref 3.5–5.2)
CO2: 29 mEq/L (ref 19–32)
Calcium: 9.2 mg/dL (ref 8.4–10.5)
Chloride: 98 mEq/L (ref 96–112)
Creatinine, Ser: 1.5 mg/dL (ref 0.4–1.5)
GFR calc Af Amer: 56 mL/min — ABNORMAL LOW (ref >60)
Sodium: 137 mEq/L (ref 135–145)

## 2011-03-20 LAB — BASIC METABOLIC PANEL
BUN: 18 mg/dL (ref 6–23)
CO2: 27 mEq/L (ref 19–32)
CO2: 32 mEq/L (ref 19–32)
Chloride: 98 mEq/L (ref 96–112)
Creatinine, Ser: 1.37 mg/dL (ref 0.4–1.5)
GFR calc Af Amer: >60 mL/min (ref >60)
Glucose, Bld: 167 mg/dL — ABNORMAL HIGH (ref 70–99)
Glucose, Bld: 172 mg/dL — ABNORMAL HIGH (ref 70–99)
Potassium: 4.2 mEq/L (ref 3.5–5.1)
Potassium: 4.8 mEq/L (ref 3.5–5.1)
Sodium: 138 mEq/L (ref 135–145)

## 2011-03-20 LAB — CARDIAC PANEL(CRET KIN+CKTOT+MB+TROPI)
CK, MB: 1.8 ng/mL (ref 0.3–4.0)
CK, MB: 2.3 ng/mL (ref 0.3–4.0)
Relative Index: INVALID (ref 0.0–2.5)
Relative Index: INVALID (ref 0.0–2.5)
Relative Index: INVALID (ref 0.0–2.5)
Total CK: 49 U/L (ref 7–232)
Total CK: 62 U/L (ref 7–232)
Total CK: 70 U/L (ref 7–232)
Troponin I: 0.05 ng/mL (ref 0.00–0.06)
Troponin I: 0.07 ng/mL — ABNORMAL HIGH (ref 0.00–0.06)

## 2011-03-20 LAB — URINALYSIS, MICROSCOPIC ONLY
Bilirubin Urine: NEGATIVE
Hgb urine dipstick: NEGATIVE
Nitrite: NEGATIVE
Protein, ur: NEGATIVE mg/dL
Specific Gravity, Urine: 1.007 (ref 1.005–1.030)
Urobilinogen, UA: 0.2 mg/dL (ref 0.0–1.0)

## 2011-03-20 LAB — GLUCOSE, CAPILLARY
Glucose-Capillary: 134 mg/dL — ABNORMAL HIGH (ref 70–99)
Glucose-Capillary: 151 mg/dL — ABNORMAL HIGH (ref 70–99)
Glucose-Capillary: 175 mg/dL — ABNORMAL HIGH (ref 70–99)
Glucose-Capillary: 188 mg/dL — ABNORMAL HIGH (ref 70–99)
Glucose-Capillary: 188 mg/dL — ABNORMAL HIGH (ref 70–99)

## 2011-03-20 LAB — HEPARIN LEVEL (UNFRACTIONATED): Heparin Unfractionated: 0.52 IU/mL (ref 0.30–0.70)

## 2011-03-24 LAB — GLUCOSE, CAPILLARY

## 2011-03-31 ENCOUNTER — Other Ambulatory Visit: Payer: Self-pay | Admitting: Cardiovascular Disease

## 2011-03-31 ENCOUNTER — Ambulatory Visit
Admission: RE | Admit: 2011-03-31 | Discharge: 2011-03-31 | Disposition: A | Discharge status: Home / Self Care | Payer: Medicare Other | Source: Ambulatory Visit | Attending: Cardiovascular Disease | Admitting: Cardiovascular Disease

## 2011-03-31 DIAGNOSIS — R0602 Shortness of breath: Secondary | ICD-10-CM

## 2011-04-03 ENCOUNTER — Observation Stay (HOSPITAL_COMMUNITY)
Admission: RE | Admit: 2011-04-03 | Discharge: 2011-04-04 | Disposition: A | Discharge status: Home / Self Care | Payer: Medicare Other | Source: Ambulatory Visit | Attending: Cardiovascular Disease | Admitting: Cardiovascular Disease

## 2011-04-03 DIAGNOSIS — Z79899 Other long term (current) drug therapy: Secondary | ICD-10-CM | POA: Insufficient documentation

## 2011-04-03 DIAGNOSIS — R079 Chest pain, unspecified: Principal | ICD-10-CM | POA: Insufficient documentation

## 2011-04-03 DIAGNOSIS — I2510 Atherosclerotic heart disease of native coronary artery without angina pectoris: Secondary | ICD-10-CM | POA: Insufficient documentation

## 2011-04-03 DIAGNOSIS — Z01812 Encounter for preprocedural laboratory examination: Secondary | ICD-10-CM | POA: Insufficient documentation

## 2011-04-03 DIAGNOSIS — I25810 Atherosclerosis of coronary artery bypass graft(s) without angina pectoris: Secondary | ICD-10-CM | POA: Insufficient documentation

## 2011-04-03 HISTORY — PX: CARDIAC CATHETERIZATION: SHX172

## 2011-04-03 LAB — PROTIME-INR
INR: 1.2 (ref 0.00–1.49)
Prothrombin Time: 15.4 seconds — ABNORMAL HIGH (ref 11.6–15.2)

## 2011-04-03 LAB — GLUCOSE, CAPILLARY: Glucose-Capillary: 164 mg/dL — ABNORMAL HIGH (ref 70–99)

## 2011-04-04 LAB — BASIC METABOLIC PANEL
Chloride: 103 mEq/L (ref 96–112)
Creatinine, Ser: 1.18 mg/dL (ref 0.4–1.5)
GFR calc Af Amer: >60 mL/min (ref >60)
Potassium: 4.9 mEq/L (ref 3.5–5.1)
Sodium: 137 mEq/L (ref 135–145)

## 2011-04-04 LAB — CBC
HCT: 38.1 % — ABNORMAL LOW (ref 39.0–52.0)
Hemoglobin: 12.5 g/dL — ABNORMAL LOW (ref 13.0–17.0)
MCH: 34.2 pg — ABNORMAL HIGH (ref 26.0–34.0)
MCV: 104.1 fL — ABNORMAL HIGH (ref 78.0–100.0)
RBC: 3.66 MIL/uL — ABNORMAL LOW (ref 4.22–5.81)
WBC: 8.4 10*3/uL (ref 4.0–10.5)

## 2011-04-04 LAB — PROTIME-INR: INR: 1.12 (ref 0.00–1.49)

## 2011-04-04 LAB — GLUCOSE, CAPILLARY: Glucose-Capillary: 141 mg/dL — ABNORMAL HIGH (ref 70–99)

## 2011-04-08 NOTE — Cardiovascular Report (Signed)
NAME:  [name], [name] NO.:  [id number]  MEDICAL RECORD NO.:  [id number]           PATIENT TYPE:  O  LOCATION:  6525                         FACILITY:  [facility]  PHYSICIAN:  Nanetta Batty, M.D.   DATE OF BIRTH:  November 05, 1941  DATE OF PROCEDURE:  04/03/2011 DATE OF DISCHARGE:                           CARDIAC CATHETERIZATION   [name] is a 70 year old mildly overweight married Caucasian male, father of 3, grandfather of 6 who I last saw in the office on March 31, 2011.  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  He had LIMA to his LAD and diagonal branch sequentially, vein to the first second marginal branches and sequential vein to acute marginal and PDA.  He has mild LV dysfunction with AFib, which is chronic.  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 Marcelyn Bruins follows.  He was last cathed a year ago and found to have patent LIMA, patent vein to an OM, high-grade mid AV groove circumflex and small vessel, and a patent vein to the PDA with high-grade insertion stenosis in the mid PDA lesion beyond graft insertion.  Over the last 6-8 weeks, he has felt fairly substernal chest pain and in the morning he was thought to be relieved with nitroglycerin radiating to his left arm.  An echo performed in the office on March 02, 2011, showed an EF of 45% to 50% without focal wall motion abnormalities.  A stress test showed inferolateral scar without ischemia.  He presents now for cardiac catheterization and rule out ischemic etiology, that is unchanged, he will be referred to EP for consideration of atrial fibrillation to restore sinus rhythm.  DESCRIPTION OF PROCEDURE:  The patient was brought to the Second Floor [facility] Cardiac Cath Lab in a postabsorptive state.  He was premedicated with p.o.  Valium.  His right groin was prepped and shaved in usual sterile fashion.  Xylocaine 1% was used for local anesthesia. A 5-French sheath was inserted into the right femoral artery using standard Seldinger technique.  5-French right and left Judkins diagnostic catheter as well as 5-French pigtail catheter and right bypass graft catheter were used for selective coronary angiography, left ventriculography, selective vein graft angiography, selective IMA angiography.  Visipaque dye was used for the entirety of the case. Retrograde aortic and left ventricular pullback pressures were recorded.  HEMODYNAMICS: 1. Aortic systolic pressure 107, diastolic pressure 63. 2. Left ventricular systolic pressure 109 and diastolic pressure 20.  SELECTIVE CORONARY ANGIOGRAPHY: 1. Left main normal. 2. LAD; occluded after the first diagonal branch, which itself had 99%     ostial stenosis. 3. Left circumflex; 95% stenosis in the midportion of the mid circ     with small continuation thereafter.  There is retrograde fill up     the OM graft with only mild disease in the OM prior to graft     insertion. 4. Right coronary artery; dominant with a 50% proximal eccentric     stenosis.  The  PDA had minimal flow and was subtotally occluded.     The continuation to the PLA branch is widely patent. 5. Vein to the PDA 50% to 60% proximal/ostial stenosis, 75% insertion     stenosis with 95% stenosis in the PDA beyond graft insertion. 6. Left ventriculography; RAO left ventriculogram was performed using     25 mL of Visipaque dye at 12 mL per second.  The overall LVEF was     estimated approximately 20% to 25% with moderate-to-severe global     hypokinesia, more prominent in the anterior wall.  IMPRESSION:  [name] anatomy is essentially unchanged from his cath exactly 1 year ago.  His LV function is somewhat depressed compared to his previous cath.  Continued medical therapy will be recommended.  ExoSeal was  used to seal his right groin and sheath was removed. Pressure was held in the groin to achieve hemostasis.  The patient left the lab in stable condition.  Heparin will be restarted in 6 hours and Coumadin per pharmacy.  He will discharged home once his INR is greater than 2.  I will see him back in the office.  At that time, I may refer him to electrophysiologist to consider potential AFib ablation.  Left lab in stable condition.     Nanetta Batty, M.D.     JB/MEDQ  D:  04/03/2011  T:  04/04/2011  Job:  [id number]  cc:   Second Floor [facility]  Electronically Signed by Nanetta Batty M.D. on 04/08/2011 04:13:27 PM

## 2011-04-13 ENCOUNTER — Other Ambulatory Visit: Payer: Self-pay | Admitting: Internal Medicine

## 2011-04-14 ENCOUNTER — Ambulatory Visit
Admission: RE | Admit: 2011-04-14 | Discharge: 2011-04-14 | Disposition: A | Discharge status: Home / Self Care | Payer: Medicare Other | Source: Ambulatory Visit | Attending: Internal Medicine | Admitting: Internal Medicine

## 2011-04-21 NOTE — Discharge Summary (Signed)
  [name], [name] NO.:  [id number]  MEDICAL RECORD NO.:  [id number]           PATIENT TYPE:  O  LOCATION:  6525                         FACILITY:  [facility]  PHYSICIAN:  [name], M.D.   DATE OF BIRTH:  July 09, 1941  DATE OF ADMISSION:  04/03/2011 DATE OF DISCHARGE:  04/04/2011                              DISCHARGE SUMMARY   DISCHARGE DIAGNOSES: 1. Unstable angina with outpatient Myoview showing scar without     ischemia, catheterization this admission revealing distal saphenous     vein graft disease with distal posterior descending artery disease     to be treated medically. 2. Cardiomyopathy with an ejection fraction of 20-25%. 3. Known coronary disease with coronary artery bypass grafting in     1998. 4. 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 5. Chronic atrial fibrillation.  HOSPITAL COURSE:  The patient is a 70 year old male followed by Dr. [name] who had bypass grafting in 1998.  He has had multiple catheterizations since.  He has moderate to severe LV dysfunction with chronic atrial fibrillation on Coumadin.  He saw Dr. [name] in the office for chest pain worrisome for angina.  Myoview was positive for scar.  He was set up for catheterization.  He was crossed from Coumadin to Lovenox as an outpatient.  Catheterization done April 03, 2011 revealed patent LIMA to the LAD and diagonal, patent SVG to the OM with a 95% unprotected circumflex, 50-60% proximal narrowing in the SVG to the PDA with 75% stenosis at the insertion of the SVG to the PDA and distal PDA disease treated medically.  He tolerated procedure well and will follow up with Dr. [name] as an outpatient.  Please see med rec for complete discharge.  He will continue his Lovenox to Coumadin crossover as an outpatient as previously instructed.  LABORATORY DATA:  White count 8.4, hemoglobin 12.5, hematocrit 38.1, platelets 186,000,  INR 1.12.  Sodium 137, potassium 4.9, BUN 12, creatinine 1.18.  DISPOSITION:  The patient discharged in stable condition.     Abelino Derrick, P.A.   ______________________________ [name], M.D.   Lenard Lance  D:  04/04/2011  T:  04/04/2011  Job:  [id number]  cc:   [name], M.D.  Electronically Signed by Corine Shelter P.A. on 04/17/2011 05:31:02 PM Electronically Signed by [name] M.D. on 04/21/2011 07:38:59 AM

## 2011-04-28 NOTE — Cardiovascular Report (Signed)
NAME:  CHA, Daniel NO.:  [id number]   MEDICAL RECORD NO.:  [id number]          PATIENT TYPE:  OIB   LOCATION:  2899                         FACILITY:  [facility]   PHYSICIAN:  Nanetta Batty, M.D.   DATE OF BIRTH:  1941/08/12   DATE OF PROCEDURE:  DATE OF DISCHARGE:  04/30/2009                            CARDIAC CATHETERIZATION   Mr. Shatto is a 70 year old mildly overweight, married white male with  history of CAD, status post bypass grafting in June 1998.  Cathed in  December 2009 revealing the occluded vein graft from OM-1 to OM-2 and  high-grade lesion in the native right which I stented.  His other  problems include diabetes and hyperlipidemia.  He has had progressive  unstable angina and presents now for diagnostic coronary arteriography  to define his anatomy and rule out progression of disease.   DESCRIPTION OF PROCEDURE:  The patient brought to the second floor [facility] Cardiac Cath Lab in postabsorptive state.  He was premedicated with  p.o. Valium.  His right groin was prepped and shaved in the usual  sterile fashion.  Xylocaine 1% was used for local anesthesia.  A 5-  French sheath was inserted into the right femoral artery using standard  Seldinger technique.  A 5-French right-left Judkins diagnostic catheter  as well as a 5-French pigtail catheter and IM catheter were used for  selective IMA angiography and left ventriculography.  Visipaque dye was  used for entirety of the case.  Retrograde aortic, left ventricular, and  pullback pressures were recorded.   HEMODYNAMICS:  1. Aortic systolic pressure 117, diastolic pressure 77.  2. Left ventricular systolic 118, end-diastolic pressure 10.   SELECTIVE CORONARY ANGIOGRAPHY:  1. Left main normal.  2. LAD; LAD had 95% stenosis in its midportion going into the first      diagonal branch.  3. Left circumflex; left circumflex gave off a small first marginal      branch, moderate size second marginal  branch with competitive flow      and an occluded third marginal branch.  4. Right coronary artery; 40% proximal stenosis followed by patent      stents in the distal vessel with mild competitive flow with the      PDA.  5. Vein graft to the acute marginal and PDA, patent with a long 60%      tubular stenosis in the aorta down approximately 30 mm.  A 60%      insertion stenosis into the PDA.  PDA itself had a 95% stenosis in      its distal third.  6. Vein graft to OM-2 widely patent with an occlusion of the      continuation to OM-3.  7. LIMA to LAD diagonal branch sequentially widely patent.   LEFT VENTRICULOGRAPHY:  RAO left ventriculogram was performed using 25  mL of Visipaque dye at 12 mL per second.  The overall LVEF estimated  approximately 50% with mild global hypokinesia.   IMPRESSION:  Mr. Spicher has anatomy pretty much unchanged from his  previous cath 6 months ago.  I am  not sure the etiology of acceleration  of symptoms.  It is certainly possible that it could be the acute  marginal PDA vein  graft to the aorta since there was some damping.  At this point, I would  prefer to continue to treat him medically.  It is also given the fact  that he has a distal PDA lesion which would be difficult to intervene  upon.  He will be discharged to home later today as an outpatient.  I  will see him back in the office in 1 week for followup.      Nanetta Batty, M.D.  Electronically Signed     JB/MEDQ  D:  04/30/2009  T:  05/01/2009  Job:  [id number]   cc:   [facility]  Gloriajean Dell. Andrey Campanile, M.D.

## 2011-04-28 NOTE — Cardiovascular Report (Signed)
NAME:  [name], [name] NO.:  [id number]   MEDICAL RECORD NO.:  [id number]          PATIENT TYPE:  INP   LOCATION:  2903                         FACILITY:  [facility]   PHYSICIAN:  Nanetta Batty, M.D.   DATE OF BIRTH:  08/06/1941   DATE OF PROCEDURE:  DATE OF DISCHARGE:                            CARDIAC CATHETERIZATION   Mr. Fonder is a 70 year old white male with history of CAD status post  remote coronary artery bypass grafting approximately 11 years ago.  His  other problems include hypertension, hyperlipidemia, mixed connective  tissue disease, hypothyroidism.  He had an Endeavor drug-eluting stent  placed by Dr. Lenise Herald in his distal right crossing his PDA on  August 2008.  He was started on Coumadin a month ago by Dr. Aleen Campi  because of PAF.  He has had unstable angina over the last few months,  worse the day of admission where he presented to the ER and sinus  tachycardia with chest pain.  He was bolus with IV heparin and his pain  ultimately improved.  He had minimal inferior ST-segment elevation.  He  received 5 mg of IV Lopressor with decrease in his heart rate and  improvement in his EKG.  He was also given 5 mg of Vitamin K and his INR  drifted down to 1.6.  He ruled out for myocardial infarction.  He  presents now for diagnostic coronary arteriography to define his anatomy  rule out ischemic etiology.   Dr. Garen Lah was the primary operator for the diagnostic portion of the  case and Dr. [name] assistant/proctor.   The patient was brought to the Second Floor [facility] Cardiac Cath Lab  in the postabsorptive state.  He was premedicated with p.o. Valium, IV  Versed, and fentanyl.  His right groin was prepped and shaved in the  usual sterile fashion.  Xylocaine 1% was used for local anesthesia.  A 6-  French sheath was inserted into the right femoral artery using standard  Seldinger technique.  A 6-French right and left Judkins diagnostic  catheter as well as 6-French pigtail catheter were used for selective  cholangiography, left ventriculography, selective vein graft  angiography, selective IMA angiography.  Visipaque dye was used for the  entirety of the case.  Retrograde aortic, left ventricular blood  pressures were recorded.   HEMODYNAMIC RESULTS:  Aortic systolic pressure 111 and diastolic  pressure 66.  Left ventricle systolic pressure 112 and diastolic pressure 18.   SELECTIVE CORONARY ANGIOGRAPHY:  1. Left main; left main had a 30% distal stenosis.  2. LAD; LAD had a 99% stenosis in the proximal third with competitive      flow distally.  3. Left circumflex; left circumflex gave off a first OM branch and had      a 90% stenosis proximally and competitive flow beyond that.  The      second OM branch appeared to be totally occluded.  4. Right coronary artery; it is a large dominant vessel with a patent      stent in the distal right straddling the PDA.  There was an 80%  ulcerated lesion just proximal to this.  5. Vein graft to the PDA; has had a 50-60% segmental ostial/proximal      stenosis.  The anastomosis of the hemilateral branch was intact.      The distal anastomosis had a 90% stenosis unchanged from prior      angiogram a year go.  The PDA beyond the graft had a 95% fairly      focal stenosis.  The PDA was a approximately 2 mm vessel.  6. Vein graft to the OM; widely patent.  The sequential vein graft to      the distal circ was occluded.  7. LIMA to LAD diagonal branch; the diagonal and LIMA and anastomoses      were widely patent.  The LAD had a 50% stenosis just beyond IMA      insertion.  8. Left ventriculography; RAO left angiogram was performed using 25 mL      of Visipaque dye at 12 mL per second.  The overall LVEF was      estimated approximately 45% with moderate-to-severe inferior      hypokinesia.   IMPRESSION:  Mr. Beaumier has high-grade distal right coronary artery  stenosis with an  ulcerated plaque just proximal to the previously-placed  stent.  We will proceed with percutaneous coronary intervention and  stenting using Angiomax and drug-eluting stent.   The 6-French sheath in the right femoral artery was exchanged over wire  for 7-French sheath.  The patient did receive four baby aspirin, 600 mg  of p.o. Plavix, 20 mg of Pepcid I.V.  The ACT was 421.   Using a 7-French JR4 guide catheter along with 0.014 x 190 Asahi soft  wire, 2.5 x 12 apex, predilatation was performed after the wire was  placed in the distal PLA branch through the previously-placed stent and  across the lesion.  A 3.0 x 23 Abbott XIENCE V drug-eluting stent was  then carefully positioned across the lesion and just overlapping the  previously-placed stent.  It was deployed at 16 atmospheres and  postdilated with a 3.25 x 20 Anton Ruiz Voyager at 15 atmospheres (3.3 mm)  resulting reduction of 80% ulcerated distal RCA stenosis to 0% residual  with excellent TIMI III flow, and no dissection.  Final completion,  angiogram did reveal that there was no retrograde fill-up the PDA graft,  which was not visible prior to intervention.   IMPRESSION:  Successful percutaneous coronary intervention stenting of  ulcerated distal right coronary artery lesion using a XIENCE V drug-  eluting stent with an Angiomax.  The patient tolerated the procedure  well.  There were no hemodynamic or electrocardiographic sequelae.  The  guidewire and catheter were removed.  The sheath was sewn securely in  place.  The patient left the lab in stable condition.  He will be gently  hydrated and his labs will be checked in the morning.  Sheath will be  removed in approximately 3 hours.  Heparin will be restarted in 4 hours.  The patient will receive his first dose of Coumadin tomorrow for his  paroxysmal atrial fibrillation.      Nanetta Batty, M.D.  Electronically Signed     JB/MEDQ  D:  12/05/2008  T:  12/06/2008  Job:   [id number]   cc:   Second Floor [place] Cardiac Cath Lab  [facility]  Antionette Char, MD

## 2011-04-28 NOTE — Discharge Summary (Signed)
[name], [name] NO.:  [id number]   MEDICAL RECORD NO.:  [id number]          PATIENT TYPE:  OIB   LOCATION:  6525                         FACILITY:  [facility]   PHYSICIAN:  Daniel Priestly, MD  DATE OF BIRTH:  January 01, 1941   DATE OF ADMISSION:  08/03/2007  DATE OF DISCHARGE:  08/04/2007                               DISCHARGE SUMMARY   DISCHARGE DIAGNOSES:  1. Recurrent angina.  2. Abnormal Myoview.  3. The patient underwent diagnostic cath July 27, 2007.  4. Due to increased stenosis of the saphenous vein graft to the right      coronary artery at the insertion, the patient was brought back      August 03, 2007, and underwent percutaneous transluminal coronary      angioplasty and stent to the saphenous vein graft to the right      coronary artery with an Endeavor non drug eluting stent.  5. Coronary disease with history of bypass grafting.  6. Diabetes mellitus type 2, diet controlled.  7. Hypertensive post cardiac cath.  Hold Lasix and Lisinopril for 24      hours.  Mild fluids were given with resolution.  8. Mixed connective tissue disorder.  On prednisone, followed by Dr.      Phylliss [name].  9. Hypothyroidism.  On replacement.   DISCHARGE CONDITION:  Improved.   PROCEDURE:  August 03, 2007, PTCA and stent deployment with a non drug  eluting stent to the saphenous vein graft to the distal RCA.   DISCHARGE MEDICATIONS:  1. Enteric-coated aspirin 81 mg daily.  2. Detrol 4 mg daily.  3. Folic acid 400 mg daily.  4. Fish oil 1000 mg six daily.  5. Tricor 145 mg daily.  6. Prednisone 5 mg two daily.  7. Gabapentin 300 mg six daily as before.  8. Lasix 40 mg one daily.  Hold on August 04, 2007.  Resume on August 05, 2007.  9. KCl 20 mEq daily.  10.Imdur 60 mg two daily.  11.Methotrexate 2.5 mg six weekly as before.  12.Synthroid 0.175 mg one daily.  13.Plavix 75 mg daily.  Continue, do not stop.  14.Vicodin 5/500 three daily as before.  15.Simvastatin 40 mg daily as before.  16.Lisinopril 10 mg daily, but hold on August 04, 2007, resume August 05, 2007.  17.Atenolol 25.  We actually have placed him on Toprol XL 25, that is      the only change in his medication list.   DISCHARGE INSTRUCTIONS:  1. Low sodium heart healthy diabetic diet.  2. Wash right groin cath site with soap and water.  Call with any      redness, swelling, or drainage.  3. Increase activity slowly.  4. May shower or bathe.  5. No lifting for 2 days.  6. No driving for 2 days.  7. Follow up with Dr. Aleen [name] or his nurse practitioner, [name], on August 23, 2007 at 9:30 a.m.  8. Stop atenolol.  Changed to Toprol XL.  9. Please walk  and exercise per instructions from cardiac rehab.  From      a heart standpoint, you need to walk to prevent further problems.   HISTORY OF PRESENT ILLNESS:  A 70 year old male patient who was seen in  Dr. Adelene [name] office secondary to chest pain, relieved with  nitroglycerin.  Occurs after meals or with exertion.  He underwent a  stress test and was positive for possible ischemia.  He then went to  diagnostic cath which was positive for disease with stenosis in the  saphenous vein graft to the RCA at the insertion site into the PDA.   He had other residual disease, and is currently on Imdur 120 mg daily.   The patient came in and had a stent placed to that as described  previously.  He only had one issue after the cath.  His pressure dropped  down into the 80s systolic.  He was given a fluid bolus and his  Lisinopril and Lasix were held the next day.  Otherwise, the patient was  stable, ambulated in the hall with cardiac rehab on August 04, 2007, and  right groin was stable.  He will be discharged home.  He will follow up  with Dr. Adelene [name] office.  They can adjust his Imdur depending on how  he feels post procedure.      [name]      Daniel Priestly, MD   Electronically Signed    LRI/MEDQ  D:  08/04/2007  T:  08/04/2007  Job:  [id number]   cc:   [name], M.D.  Daniel Char, MD  [name], M.D.

## 2011-04-28 NOTE — Cardiovascular Report (Signed)
[name], VANDERLOOP NO.:  [id number]   MEDICAL RECORD NO.:  [id number]          PATIENT TYPE:  OIB   LOCATION:  2899                         FACILITY:  [facility]   PHYSICIAN:  Richard A. Alanda Amass, M.D.DATE OF BIRTH:  [birth year]   DATE OF PROCEDURE:  07/27/2007  DATE OF DISCHARGE:  07/27/2007                            CARDIAC CATHETERIZATION   PROCEDURE:  Retrograde central aortic catheterization, selective  coronary angiography via Judkins' technique, saphenous vein graft  angiography, selective; selective LIMA angiogram, sub-selective left  subclavian angiogram, LV angiogram RAO, LAO projection, abdominal aortic  angiogram midstream PA projection.   PROCEDURE PERFORMED:  The patient was brought to the second floor CP Lab  room #2 in the postabsorptive state, after 5 mg Valium p.o. pre-  medication.  He was given 2 mg of IV Versed for sedation.  Heparin was  on hold.  The patient had been given aspirin prior to the procedure.  His Plavix had been held for 5 days prior to admission.  The right groin  was prepped, draped in the usual manner.  One percent Xylocaine was used  for local anesthesia, and the RCFA was entered with a single anterior  puncture, using an 18 thin-walled needle.  A 6-French short sidearm  sheath was inserted, without difficulty.  Catheter exchange with guide  wire was used throughout the procedure, and a 6-French 4-cm taper  Cordis, left and right coronary catheters, were used for selective  coronary angiography in multiple orthogonal projections.  Saphenous vein  graft angiography of the SVG to the right and the SVG to this CX were  done with a right coronary catheter.  Sub-selective left subclavian  injection was done by hand, and selective LIMA injection was done with a  6-French SciMed LIMA catheter.  A pigtail catheter 6-French was used for  LV angiogram in the RAO and LAO projection at 23 mL, 14 mL per second  and 20 mL, 12 mL per  second.  Pullback pressure CA was performed showing  no grade across the aortic valve.  Catheter was brought down above the  level of the renal arteries in the abdominal aorta, and abdominal aortic  angiogram was done at 20 mL, 20 mL per second.  Catheter with runoff to  the SFA profunda junctions bilaterally.  Catheter was removed.  Side-arm  sheath was flushed.  Cine angiograms were reviewed with Dr. Jenne Campus.  The patient was transferred to holding area for sheath removal and  pressure hemostasis.  He tolerated the procedure well and had good right  lower extremity pulses.   PRESSURES:  LV:  95/0; LVEDP 16-18 mmHg.   CA:  95/60 mmHg.   There is no gradient across the aortic valve on catheter pullback.   LV angiogram in the RAO projection demonstrated mild hypokinesis in the  mid-anterolateral wall and hypo-akinesis in the mid-inferior wall.  There was hypokinesis of the posterior apical segment.  There was no  significant MR present.  Estimated EF was approximately 45% to 50%.   The left subclavian had some irregularities but was widely patent.  The  left vertebral  was tortuous but widely patent with antegrade flow.   FLUOROSCOPY:  Fluoroscopy revealed 2+ calcification of the main left  proximal LAD circumflex and native right coronary artery.  The main left  coronary artery was large and had about 40% narrowing with good residual  lumen in the distal third.   The left anterior descending artery was totally occluded in the junction  of the proximal third, after two small diagonal branches and a moderate-  size septal perforator branch.  There was a lesion of about 80% to 90%  at the LAD, at the origin of the [facility] septal perforator  branch.  There was no antegrade flow to the LAD.   Circumflex artery gave a moderately small OM1.  There was then a large  OM-2 that bifurcated distally.  This was seen antegrade, and there was  retrograde filling of the graft to the  proximal third of the OM-2,  almost up to it's origin.  Beyond the OM-2 in the AV groove circ, there  were tandem 50% lesions, then an area of 80% eccentric stenosis in the  distal third before small distal branch.  This may have been the area of  the patient's remote PTA.   The native right coronary was a large vessel that was dominant.  There  was 40% irregularity and narrowing in the proximal third, 60% eccentric  narrowing at the junction of the proximal third, 65% narrowing before  the acute margin, and 40% narrowing after the acute margin.  There was a  complex tandem 75 to 80 and 80% or greater stenosis of the distal RCA,  before the PDA and PLA branch. Angiographically, I could not of  visualize any stents in the PLA or distal RCA.  There are two small PDA  branches, and there was some minimal antegrade filling of the small PDA  that was grafted.   SVG to the RCA was a thin graft with irregularities and about 40%  narrowing just after the ostia, but it filled quite well on selective  injection.  The graft itself had irregularities without high-grade  stenosis.  There was, however, a 95% stenosis just before the insertion  to the PDA.  The PDA was relatively smaller, though it did give off some  branches.  There was slow flow to the PDA.   There was a sequential branch to an RV marginal branch from the  midportion of the graft that was patent.   A sequential branch to a distal marginal - CX from the initial grafting  was not visualized.   The saphenous vein graft to the sequential saphenous vein graft to the  OM-2 was widely patent with an excellent anastomosis.  There were  irregularities of the OM-2, which is a large vessel but no significant  stenosis, and good flow antegrade and retrograde as outlined.  The  distal sequential branch from that graft was not visualized.   The LIMA was widely patent, tortuous with an excellent anastomosis to  the LAD, filling both the  diagonal and the LAD.  The diagonal trifurcate  was moderately large, and the LAD was a fairly large vessel that coursed  the apex of the __________ .  There were some irregularities but no high-  grade stenosis present.   Abdominal aortic angiogram and midstream PA projection revealed single  widely patent renal arteries bilaterally.  There was no infrarenal AAA  present, and there were tortuous iliacs, but they were widely patent  with no  significant iliac or hypogastric disease, and the SFA profunda  junctions, appeared normal bilaterally.   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  He has had remote  bilateral knee replacements.  He is status post remote CABG by Dr.  Andrey Campanile June 1998.  His last catheterizations, according to available  records, show a PTA to the OM branch of the circumflex and the distal  RCA on September 16, 1998 for recurrent angina.  He was re-capped early on  October 09, 1998 with restenosis of the PLA lesion, and this was stented  with a bare metal stent and a large vessel, with a good result.  He had  an occluded distal leg to the OM and an occlusion of the PLA branch of  the SVG to the RCA.   The patient has done quite well long-term from a cardiac standpoint  without angina, until the last 1-2 month, when he had developed typical  recurrent substernal chest pain, with minimal exertion and with  postprandial.  It has been relieved by nitroglycerin.  He did have a  prolonged episode about a week ago, requiring four nitroglycerin for  relief.  He had been on chronic Plavix therapy .  He has not been on  aspirin, presumably because of prednisone therapy.  He takes  methotrexate and prednisone for his MCTD, which is under fairly good  control, but his activity is limited.  He has obesity and hypothyroidism  on  therapy and hyperlipidemia.  Apparently, medical therapy been limited  by relative bradycardia and hypotension with beta blockers.   It appears that this patient's culprit lesion may be the complex,  somewhat disrupted-appearing the lesion without thrombus in the distal  RCA before the large PLA branch, which is widely patent.  It is  difficult to say where his angina is from, but this may be the culprit  area.  He does have a high-grade stenosis at the insertion of the SVG to  the PDA, but it is a thin PDA, and there is a marked bend and loop in  this area, and it does not appear to be very amendable to attempts at  PCI.  He has an excellent vein graft to the OM-2, which is a large  vessel and excellent sequential vein graft to the diagonal and LAD, with  fairly well-preserved LV function.    I have reviewed these angiograms with Dr. Jenne Campus.  We feel that the  patient should be started back on Plavix, continued on aspirin, treated  medically at present, then we would make plans to bring him back for PCI  in his distal native RCA.   CATHETERIZATION DIAGNOSES:  1. Arteriosclerotic heart disease  - recurrent angina, 1 to 2 months      with inferolateral ischemia on recent Cardiolite March 21, 2007.  2. Remote coronary artery bypass graft, Dr. Andrey Campanile June 1998.      a.     Patent LIMA sequential diagonal - LAD, excellent graft.      b.     Patent SVG to OM II with occluded distal sequential leg.      c.     Patent SVG to PDA with occluded leg of PLA and 95% stenosis       near insertion.      d.  LV dysfunction, mild EF 45% to 50% with wall motion       abnormalities.      e.     Adult onset diabetes mellitus, no medical therapy at       present.   DIAGNOSES:  Within the last year:  1. Exogenous obesity.  2. Mixed connective tissue disease in the care of Dr. Phylliss Bob, on      prednisone therapy chronic, and methotrexate.  3. Hypothyroidism on supplement.  4. Bilateral total knee  replacement.  5. Exogenous obesity.  6. Nonsmoker.   INDICATIONS:      Richard A. Alanda Amass, M.D.  Electronically Signed     RAW/MEDQ  D:  07/27/2007  T:  07/28/2007  Job:  [id number]   cc:   Record Room  Antionette Char, MD  Areatha Keas, M.D.  Darlin Priestly, MD  [facility] Lab  Richard A. Alanda Amass, M.D.

## 2011-04-28 NOTE — Cardiovascular Report (Signed)
[name], Daniel NO.:  [id number]   MEDICAL RECORD NO.:  [id number]          PATIENT TYPE:  OIB   LOCATION:  6525                         FACILITY:  [facility]   PHYSICIAN:  Darlin Priestly, MD  DATE OF BIRTH:  1941/08/11   DATE OF PROCEDURE:  08/03/2007  DATE OF DISCHARGE:                            CARDIAC CATHETERIZATION   PROCEDURES:  1. Coronary angiography.  2. Right coronary artery -- distal placement of intracoronary stent.   COMPLICATIONS:  None.   INDICATIONS:  [name] is a 70 year old male patient of Dr. Charolette Child with a history of known CAD, status post coronary artery bypass  surgery consisting of a LIMA sequentially to the LAD and diagonal,  sequential vein graft to OM-2, as well as a patent sequential vein graft  to obtuse marginal and PLA.  The patient also has a history of diabetes  as well as mixed connective tissue disease, on chronic methotrexate and  prednisone.  He recently underwent a stress Cardiolite suggesting  inferolateral wall ischemia with subsequent cardiac catheterization by  Dr. Alanda Amass revealing a high-grade distal RCA lesion.  He is now  brought for percutaneous intervention of the RCA.   DESCRIPTION OF OPERATION:  After giving informed written consent, the  patient was brought to the cardiac cath lab where the right groin was  shaved, prepped and draped in a sterile fashion.  EKG monitoring was  established.  Using a modified Seldinger technique, a #6-French arterial  sheath was inserted in the right femoral artery.  A 6-French JR-4  guiding catheter with side holes was coaxially engaged in the right  coronary ostium and angiogram was then performed.  Next, a 0.014 marker  wire was advanced down the guiding catheter and positioned in the distal  PLA without difficulty.  Measurements were made across the stenotic  lesion with the marker wire.  Next, an Endeavor 3 x 18-mm stent was then  positioned across the  distal RCA stenotic lesion.  This stent was then  deployed to 9 atmospheres for a total of approximately 15-20 seconds.  A  second inflation to 14 atmospheres was then performed for a total of 10  seconds.  Followup angiogram revealed excellent luminal gain with no  evidence of dissection or thrombus and TIMI-3 flow to the distal vessel.  IV Angiomax was used throughout the case.   Final orthogonal angiograms revealed less than 10% residual stenosis in  the distal RCA stenotic lesion with TIMI-3 flow to the distal vessel.  At this point, we elected to conclude the procedure.  All balloons,  wires and catheters were removed.  Hemostatic sheaths were sewn in  place.  The patient transferred back to the ward in stable condition.   CONCLUSION:  1. Successful placement of an Endeavor 3 x 18-mm stent in the distal      right coronary artery stenotic lesion.  2. Adjuvant use of Angiomax infusion.      Darlin Priestly, MD  Electronically Signed     RHM/MEDQ  D:  08/03/2007  T:  08/04/2007  Job:  [id number]  cc:   Antionette Char, MD  Areatha Keas, M.D.

## 2011-04-28 NOTE — Discharge Summary (Signed)
[name], [name] NO.:  [id number]   MEDICAL RECORD NO.:  [id number]          PATIENT TYPE:  INP   LOCATION:  2903                         FACILITY:  [facility]   PHYSICIAN:  Antionette Char, MD    DATE OF BIRTH:  08-Feb-1941   DATE OF ADMISSION:  12/03/2008  DATE OF DISCHARGE:  12/06/2008                               DISCHARGE SUMMARY   FINAL DIAGNOSES:  1. Unstable angina.  2. Coronary artery disease.  3. Hypertension.  4. Hypercholesterolemia.  5. Paroxysmal atrial fibrillation.   OPERATIONS AND PROCEDURES:  Cardiac cath on December 05, 2008, with  angioplasty of his distal right coronary artery.   REASON FOR ADMISSION:  This 71 year old male with a history of 3-vessel  coronary artery disease and is status post coronary artery bypass graft  surgery.  He has a history of progression of disease in his distal right  coronary artery requiring stenting approximately 18 months ago.  He now  returns with multiple episodes of chest pain relieved by nitroglycerin  and his enzymes were negative on admission.  Approximately 1 month ago,  he had an episode of sustained atrial fibrillation and was started on  Coumadin at that time.  He was presently on Coumadin which was stopped  at time of admission.   HOSPITAL COURSE:  After noting negative enzymes and no change on his  electrocardiogram and being in sinus rhythm, he was admitted to the  intensive care unit because of the severity in frequency of his episodes  of chest pain.  After his prothrombin time reached a safe level, he was  then scheduled for cardiac cath which was performed on December 05, 2008.  A cath, his previous stent in his distal right coronary artery  was patent, however, just proximal to the stent, there was a new lesion  of 80% stenosis which had an ulcerated appearance.  A new stent was then  deployed in this new site, and a successful opening was obtained.  He is  now stable post angioplasty  procedure and ready for discharge.  He has  remained in sinus rhythm throughout the hospitalization without further  evidence of atrial fibrillation.  Therefore, we will keep him off of  Coumadin and continue on the Plavix and aspirin.   DISPOSITION ON DISCHARGE:  ACTIVITY:  No heavy lifting for a week,  increase activity slowly otherwise.  DIET:  Heart healthy.   MEDICATIONS:  He is to discontinue the Coumadin.  1. Plavix 75 mg daily.  2. Aspirin 81 mg daily.  3. Prednisone 5 mg b.i.d.  4. Neurontin 600 mg t.i.d.  5. Lasix 40 mg daily.  6. Potassium chloride 20 mEq daily.  7. Isosorbide mononitrate 60 mg b.i.d.  8. Synthroid 175 mcg daily.  9. Simvastatin 40 mg nightly.  10.Lisinopril 10 mg daily.  11.Folic acid daily.  12.TriCor 145 mg daily.  13.Cyclobenzaprine 20 mg nightly.  14.Oxybutynin 10 mg nightly.  15.Metoprolol 25 mg daily.   Followup appointment in 1 week with Dr. Jason Fila in our office to check his  fasting blood sugar and in 2  weeks with Dr. Aleen Campi for an office  visit.   CONDITION ON DISCHARGE:  Improved and stable.      Antionette Char, MD  Electronically Signed     JRT/MEDQ  D:  12/06/2008  T:  12/06/2008  Job:  [id number]   cc:   [facility]

## 2011-05-01 NOTE — Discharge Summary (Signed)
[facility]  Patient:    SHMIEL, MORTON Visit Number: [id number] MRN: [id number]          Service Type: SUR Location: [id number] Attending Physician:  Erasmo Leventhal Dictated by:   Ralene Bathe, P.A. Admit Date:  01/10/2002 Discharge Date: 01/14/2002                             Discharge Summary  ADMISSION DIAGNOSES: 1. End-stage osteoarthritis of the left knee. 2. Rheumatoid arthritis. 3. Atherosclerotic coronary artery disease. 4. Hypothyroidism. 5. Questionable history of connective tissue disease in the lung.  DISCHARGE DIAGNOSES: 1. End-stage osteoarthritis of the left knee. 2. Rheumatoid arthritis. 3. Atherosclerotic coronary artery disease. 4. Hypothyroidism. 5. Questionable history of connective tissue disease in the lung. 6. Status post left total knee arthroplasty.  OPERATION: Left total knee arthroplasty.  SURGEON:  R. Valma Cava, M.D.  ASSISTANTJill Side P. Mahar, P.A.  BRIEF HISTORY:  Mr. Spiewak is a 70 year old man who has known end-stage osteoarthritis of the left knee and has failed conservative measures.  He is having significant pain effecting his activities of daily living and at this time, total knee arthroplasty is indicated.  The risks and benefits are discussed with the patient.  The patient wishes to proceed.  He was actually scheduled earlier, however his Plavix was not stopped in time, and therefore his surgery was rescheduled to this date.  HOSPITAL COURSE:  The patient was admitted and underwent the above-named procedure and tolerated this well.  All appropriate IV antibiotics and analgesics were utilized.  Postoperatively, the patient was placed back on subcutaneous Lovenox and Coumadin until therapeutic per Dr. Star Age recommendations.  The patient did extremely well.  Hemovac was left one additional day due to output.  However, he remained hemodynamically stable with only a mild  hemoglobin loss down to 10.  He was asymptomatic.  All in all, the patient began working with therapy.  A rehab consult was ordered.  He was weightbearing as tolerated on total knee replacement protocol, however was doing too well to go to inpatient rehab.  Therefore, outpatient physical therapy was arranged.  On day January 14, 2002, he had met all discharge goals, he was ambulating distances of over 200 feet.  He was doing well with range of motion and his independent exercises.  He was on Coumadin postoperatively and this is being followed by the pharmacy.  He was afebrile, his vital signs were stable, and his incision was clean and dry.  No hemarthrosis was noted and he was neurovascularly intact.  At this time, he was stable for discharge to home to follow up on an outpatient basis.  LABORATORY DATA: 1. Admission hemoglobin 14.6, postoperatively at 12.4, 10.2, and 9.9. 2. Protimes and INR was found in the chart on admission within normal limits.    Postoperatively followed on Coumadin. 3. Chemistries on admission were within normal limits.  Postoperatively, mild    elevated glucose at 144 and 194.  These were not fasting.  Blood type    O positive. 4. Chest x-ray from December 26, 2001 shows post-CABG, no active disease. 5. EKG shows old inferolateral infarct with no changes.  This is from an    outpatient reading from [facility].  CONDITION ON DISCHARGE:  Stable and improved.  DISCHARGE MEDICATIONS AND PLANS:  1. The patient has been discharged to home.  2. Prescriptions were written for Percocet  5/325, #40, 1-2 q.4-6h p.r.n.     pain.  3. Robaxin 500 mg, #30, 1 q.8h p.r.n. spasm, 1 refill.  4. Coumadin per protocol.  5. Trinsicon 1 b.i.d.  6. May be up as instructed with therapy.  Weightbearing as tolerated by     knee or exercises.  7. Daily dressing change p.r.n.  8. May shower at this time.  9. Home health PT R.N. arranged. 10. Resume home medications and home  diet, other than Plavix. 11. Follow up in two weeks.  Call for time. Dictated by:   Ralene Bathe, P.A. Attending Physician:  Erasmo Leventhal DD:  03/01/02 TD:  03/02/02 Job: [id number]

## 2011-05-01 NOTE — Op Note (Signed)
[place]. [facility]  Patient:    [name], [name] Visit Number: [id number] MRN: [id number]          Service Type: SUR Location: 5000 [id number] Attending Physician:  Erasmo Leventhal Dictated by:   Elvera Lennox Valma Cava, M.D. Proc. Date: 01/10/02 Admit Date:  01/10/2002   CC:         Aram Candela. Aleen Campi, M.D.   Operative Report  PREOPERATIVE DIAGNOSIS: Left knee end-stage osteoarthritis.  POSTOPERATIVE DIAGNOSIS: Left knee end-stage osteoarthritis.  OPERATION/PROCEDURE: Left total knee arthroplasty.  SURGEON: R. Valma Cava, M.D.  ASSISTANTJill Side P. Mahar, P.A.  ANESTHESIA: General, with postoperative femoral nerve block.  ESTIMATED BLOOD LOSS: Less than 50 cc.  DRAINS: Two medium Hemovac.  COMPLICATIONS: None.  TOURNIQUET TIME: One hour and 57 minutes at 400 mm Hg.  DISPOSITION: To PACU, stable.  OPERATIVE IMPLANTS: Osteonics components, all cemented; size 11 femur, size 11 tibia, 10 mm polyethylene tibial insert, and 28 mm polyethylene patella. Posterior stabilized.  DESCRIPTION OF PROCEDURE: The patient was counseled in the holding area. Correct size was identified.  IV was started and antibiotics were given. Taken to the operating room and placed in the supine position.  General anesthesia.  The left knee was examined.  There was 5 degree flexion contracture, flexing to 120 degrees.  A Foley catheter was placed utilizing sterile technique by the OR circulating nurse.  The left lower extremity was elevated, prepped with DuraPrep and draped in sterile fashion.  Exsanguinated with an Esmarch, tourniquet inflated to 400 mm Hg.  He had an old previous traumatic injury to her knee as a kid and there was a laceration scar distally, and I tried to avoid this as much as possible.  A longitudinal incision was made through the skin and subcutaneous tissue. Small skin flaps were developed and hemostasis was obtained.   Medial parapatellar arteriotomy was performed.  Medial soft tissue release down proximal and medial tibia at the appropriate level.  Patella was everted and the knee was then flexed.  He had marked end-stage osteoarthritic changes throughout the entire knee.  Cruciate ligaments were resected.  A starting hole was made in the distal femur and the canal was irrigated.  Intramedullary rod was gently placed.  A chosen 5 degree area was cut off the distal femur and a 10 mm cut off the distal femur.  The distal femur was cut appropriately. It was then found to be a size #11.  Rotational marks were made and the cutting block was applied to fit a size #11.  Medial and lateral menisci were removed under direct visualization and vessels were coagulated.  Tibial ends were resected.  Again, the posterior cruciate ligaments had been resected already.  Proximal tibia found to be a size #11.  A starting hole was made and step image utilized.  Canal was irrigated until effluent was clear. Intramedullary rod was gently placed.  Typical 10 mm cut based upon the lateral side.  This was done with 5 degree posterior slope after establishing correct rotation.  Posteromedial and posterolateral femoral osteophytes were removed under direct visualization.  These were quite large in size.  The femoral tract was prepared in standard fashion.  At this point with size 11 femur, size 11 tibia, with a 10 mm polyethylene insert, we had excellent range of motion with soft tissue balance and alignment.  In addition, tracking was excellent.  Rotation mark was made in the proximal tibia and then Keel performed in  standard fashion.  The patella was found to be a size #28 and it was reamed to a depth of 10 mm. Locking holes were made and excess bone was removed.  At this point utilizing Moder cement technique all components were cemented into place.  Size #11 femur, size #11 tibia.  A 10 mm polyethylene insert and 28 mm  polyethylene patella.  After cement had cured, put through range of motion and 10 mm polyethylene insert with excellent range of motion, soft tissue balance in flexion and extension.  Tower was removed.  Excess cement was removed and 10 mm insert placed.  Patellofemoral tracking was still too lateral but not subluxed and therefore a partial lateral release was performed to make sure of it, with nice hemostasis.  The knee was copiously irrigated. ______ was placed on exposed bony surfaces.  The knee was well balanced. Irrigated thoroughly.  Two Hemovac drains were placed.  Sequential closure of layers was done in synovium with Vicryl, arteriotomy Vicryl, subcu Vicryl. Due to the fact that this patient had been Plavix before surgery Dr. Aleen Campi wanted him on Lovenox and Coumadin as concerned about postoperative bleeding. Therefore a subcuticular suture was not utilized.  The skin was closed with staples.  Further anesthesia, 20 cc 0.50% Marcaine with epinephrine was placed into the knee joint.  A sterile compressive dressing was applied to the knee along with Ace wraps, ice pack and knee immobilizer.  After applying the dressing to the knee the tourniquet was deflated.  Had normal pulse and circulation in foot and ankle at the end of the case.  Given 1 g Ancef intravenously prior to tourniquet being inflated.  No complications.  Following this he had a femoral nerve block placed per anesthesia.  There were no complications.  Sponge and needle counts were correct.  He was gently awakened and taken from the operating room to the PACU in stable condition.  Due to increased surgical time and to help with entire surgical procedure Ms. Mahars assistance was needed throughout this case. Dictated by:   R. Valma Cava, M.D. Attending Physician:  Erasmo Leventhal DD:  01/10/02 TD:  01/11/02 Job: [id number]

## 2011-05-01 NOTE — Op Note (Signed)
   NAME:  RANDIE, TALLARICO                        ACCOUNT NO.:  [id number]   MEDICAL RECORD NO.:  [id number]                   PATIENT TYPE:  AMB   LOCATION:  ENDO                                 FACILITY:  [facility]   PHYSICIAN:  Georgiana Spinner, M.D.                 DATE OF BIRTH:  [birth year]   DATE OF PROCEDURE:  06/07/2003  DATE OF DISCHARGE:                                 OPERATIVE REPORT   PROCEDURE:  Colonoscopy.   ENDOSCOPIST:  Georgiana Spinner, M.D.   INDICATIONS:  Colon polyps.   ANESTHESIA:  Demerol 10, Versed 2 mg additionally.   DESCRIPTION OF PROCEDURE:  With the patient mildly sedated in the left  lateral decubitus position, the Olympus videoscopic coloscope was inserted  into the rectum, after a normal rectal examination; and passed under direct  vision into the cecum, identified by the ileocecal valve and appendiceal  orifice, both of which were photographed; however, the photographs were lost  due to a technical difficulty.   From this point, the colonoscope was slowly withdrawn taking circumferential  views of the entire colonic mucosa stopping first in the ascending colon,  about 3-4 polyps removed from the ileocecal valve whereupon all was seen,  photographed, and removed using snare cautery removed at a setting of 2200  on the Irby pulse generator.  There was a slight amount of tissue remaining  which had been eradicated using the hot biopsy forceps to burn it.  Tissue  was suctioned into the endoscope.   We then encountered another polyp.  It was small, it was in the descending  colon and it was removed, also using snare cautery technique at the setting  of 2000 blended current from the Irby Argon pulse generator. We then  withdrew all the way to the rectum which appeared normal on direct and  showed hemorrhoids on the retroflexed view.  The endoscope was straightened  and withdrawn.   The patient's vital signs and pulse oximetry remained stable.  The patient  tolerated the procedure well without apparent complications.   FINDINGS:  Polyps of ascending and descending colon await biopsy report.  The patient will call me for results and follow up with me as an outpatient.                                               Georgiana Spinner, M.D.    GMO/MEDQ  D:  06/07/2003  T:  06/07/2003  Job:  [id number]

## 2011-05-01 NOTE — Op Note (Signed)
   NAME:  [name], [name]                        ACCOUNT NO.:  [id number]   MEDICAL RECORD NO.:  [id number]                   PATIENT TYPE:  AMB   LOCATION:  ENDO                                 FACILITY:  [facility]   PHYSICIAN:  Georgiana Spinner, M.D.                 DATE OF BIRTH:  12-23-1940   DATE OF PROCEDURE:  DATE OF DISCHARGE:                                 OPERATIVE REPORT   PROCEDURE:  Upper endoscopy.   ENDOSCOPIST:  Georgiana Spinner, M.D.   INDICATIONS:  GERD.   ANESTHESIA:  Demerol 50, Versed 4 mg.   DESCRIPTION OF PROCEDURE:  With the patient mildly sedated in the left  lateral decubitus position, the Olympus videoscopic endoscope was inserted  into the mouth and passed under direct vision through the esophagus which  appeared normal, into the stomach, fundus, body antrum, duodenal bulb, and  second portion of the duodenum all appeared normal.   From this point the endoscope was slowly withdrawn taking circumferential  views of the duodenal mucosa.  The endoscope was then pulled back into the  stomach and placed in retroflexion to view the stomach from below.  The  endoscope was straightened and withdrawn taking circumferential views of the  remaining gastric and esophageal mucosa.  The patient's vital signs and  pulse oximetry remained stable.  The patient tolerated the procedure well  without apparent complications.   FINDINGS:  Unremarkable examination; proceed to colonoscopy                                               Georgiana Spinner, M.D.    GMO/MEDQ  D:  06/07/2003  T:  06/07/2003  Job:  [id number]

## 2011-05-01 NOTE — H&P (Signed)
[place]. [facility]  Patient:    [name], [name] Visit Number: [id number] MRN: [id number]          Service Type: SUR Location: 5000 [id number] Attending Physician:  Erasmo Leventhal Dictated by:   Ralene Bathe, P.A. Admit Date:  01/10/2002                           History and Physical  DATE OF BIRTH:  December 10, 1941  His surgery was originally scheduled for the 14th and rescheduled to January 10, 2002.  His H&P was performed on December 23, 2001.  CHIEF COMPLAINT:  Left knee pain.  HISTORY OF PRESENT ILLNESS:  Mr. Fancher is a 70 year old male that was referred to our practice by Dr. Lacretia Nicks. [facility]et for evaluation of his knee.  He has had chronic arthritis that has been progressive and he is having significant pain and deformity.  He has also been diagnosed with prominent rheumatoid arthritis.  His knee has been shown to have progressive bone-on-bone deformities, severely tender on the medial and lateral joint lines and painful range of motion.  X-rays have shown neutral alignment with bone-on-bone contact and large osteophyte formation with some loose bodies. With his findings, it is felt that he requires total knee arthroplasty at this time.  Risks and benefits were discussed with patient and he is in agreement and wishes to proceed.  Patient did have a right total knee back in 1998 and has done extremely well with that.  PAST MEDICAL HISTORY:  1. Rheumatoid arthritis.  2. Atherosclerotic coronary artery disease.  3. Hypothyroidism.  4. Questionable history of connective tissue disease in the lung.  PAST SURGICAL HISTORY:  1. Left knee arthroscopy.  2. Right total knee arthroplasty.  3. ORIF of right patella.  4. ORIF of right femur.  5. Bilateral carpal tunnel release.  6. Cataract surgery.  7. Coronary artery bypass grafting in 1996.  8. Two angioplasties since that time.   MEDICATIONS:  1. Folic acid 1 mg a day.  2.  Prednisone 5 mg two q.d.  3. Neurontin 300 mg two t.i.d.  4. Hydrocodone one t.i.d. p.r.n.  5. Synthroid 150 mcg q.d.  6. Methotrexate 2.5 mg one time per week.  7. Plavix 75 mg q.d., which he has stopped preoperatively.  8. Isosorbide 60 mg one b.i.d.  9. K-Dur 20 mEq one q.d. 10. Furosemide 40 mg q.d. 11. Zocor 40 mg q.d.  ALLERGIES:  BIAXIN.  SOCIAL HISTORY:  He lives with his wife in a two-story house with the ability to live downstairs, three steps into the usual entrance.  He is married.  Does not smoke.  Does not use alcohol.  He is currently retired.  FAMILY HISTORY:  Significant for father with CHF.  REVIEW OF SYSTEMS:  Patient denies any recent fevers, chills or night sweats. No bleeding tendencies.  CNS:  No blurred or double vision, seizures, headaches or paralysis.  RESPIRATORY:  No shortness of breath or productive cough.  CARDIOVASCULAR:  He does have occasional chest pain which he uses nitroglycerin for infrequently.  He did have a cardiac clearance preoperatively.  ABDOMEN:  No diarrhea, constipation, melena or bloody stools. GENITOURINARY:  He does have some nocturia, otherwise, no dysuria or hematuria.  MUSCULOSKELETAL:  As pertinent in present illness.  PHYSICAL EXAMINATION:  GENERAL:  Well-developed, well-nourished 70 year old male.  He walks with an antalgic gait.  VITAL SIGNS:  Blood pressure  is 150/108 today on exam.  HEENT:  Normocephalic.  Extraocular motions intact.  NECK:  Supple.  No lymphadenopathy.  No carotid bruits appreciated on exam.  CHEST:  Clear to auscultation bilaterally.  No rales or rhonchi.  HEART:  Regular rate and rhythm.  No murmurs, gallops or rubs.  ABDOMEN:  Positive bowel sounds.  Soft and nontender.  EXTREMITIES:  As above.  No pitting edema is noted.  He is neurovascularly intact in bilateral lower extremities with positive distal pulses.  IMPRESSION:  1. Left knee end-stage osteoarthritis/rheumatoid arthritis.  2.  Rheumatoid arthritis.  3. Atherosclerotic coronary artery disease.  4. Hypothyroidism.  5. Questionable history of connective tissue disease of the lungs.  6. Hyperlipidemia.  PLAN:  Patient is admitted for a left total knee arthroplasty.  He did stop his Plavix seven days preoperatively.  Will proceed as scheduled. Dictated by:   Ralene Bathe, P.A. Attending Physician:  Erasmo Leventhal DD:  01/05/02 TD:  01/05/02 Job: [id number]

## 2011-09-17 LAB — GLUCOSE, CAPILLARY
Glucose-Capillary: 142 mg/dL — ABNORMAL HIGH (ref 70–99)
Glucose-Capillary: 165 mg/dL — ABNORMAL HIGH (ref 70–99)
Glucose-Capillary: 201 mg/dL — ABNORMAL HIGH (ref 70–99)
Glucose-Capillary: 214 mg/dL — ABNORMAL HIGH (ref 70–99)
Glucose-Capillary: 220 mg/dL — ABNORMAL HIGH (ref 70–99)
Glucose-Capillary: 254 mg/dL — ABNORMAL HIGH (ref 70–99)

## 2011-09-17 LAB — PROTIME-INR
INR: 1.6 — ABNORMAL HIGH (ref 0.00–1.49)
INR: 2.8 — ABNORMAL HIGH (ref 0.00–1.49)
Prothrombin Time: 17.2 seconds — ABNORMAL HIGH (ref 11.6–15.2)
Prothrombin Time: 20.1 seconds — ABNORMAL HIGH (ref 11.6–15.2)
Prothrombin Time: 28.1 seconds — ABNORMAL HIGH (ref 11.6–15.2)
Prothrombin Time: 31.8 seconds — ABNORMAL HIGH (ref 11.6–15.2)

## 2011-09-17 LAB — BASIC METABOLIC PANEL
BUN: 12 mg/dL (ref 6–23)
CO2: 28 mEq/L (ref 19–32)
Calcium: 8.5 mg/dL (ref 8.4–10.5)
Calcium: 9.5 mg/dL (ref 8.4–10.5)
Chloride: 105 mEq/L (ref 96–112)
Chloride: 105 mEq/L (ref 96–112)
Creatinine, Ser: 1.36 mg/dL (ref 0.4–1.5)
Creatinine, Ser: 1.49 mg/dL (ref 0.4–1.5)
GFR calc Af Amer: 57 mL/min — ABNORMAL LOW (ref >60)
GFR calc Af Amer: >60 mL/min (ref >60)
GFR calc non Af Amer: 47 mL/min — ABNORMAL LOW (ref >60)
Potassium: 4.2 mEq/L (ref 3.5–5.1)
Sodium: 138 mEq/L (ref 135–145)

## 2011-09-17 LAB — APTT: aPTT: 30 seconds (ref 24–37)

## 2011-09-17 LAB — CBC
HCT: 40.9 % (ref 39.0–52.0)
HCT: 44 % (ref 39.0–52.0)
Hemoglobin: 13.5 g/dL (ref 13.0–17.0)
Hemoglobin: 14.4 g/dL (ref 13.0–17.0)
MCHC: 33 g/dL (ref 30.0–36.0)
MCV: 104.4 fL — ABNORMAL HIGH (ref 78.0–100.0)
MCV: 105.1 fL — ABNORMAL HIGH (ref 78.0–100.0)
Platelets: 133 10*3/uL — ABNORMAL LOW (ref 150–400)
Platelets: 138 10*3/uL — ABNORMAL LOW (ref 150–400)
Platelets: 149 10*3/uL — ABNORMAL LOW (ref 150–400)
Platelets: 176 10*3/uL (ref 150–400)
RBC: 3.98 MIL/uL — ABNORMAL LOW (ref 4.22–5.81)
RBC: 4.22 MIL/uL (ref 4.22–5.81)
RDW: 16.7 % — ABNORMAL HIGH (ref 11.5–15.5)
RDW: 16.8 % — ABNORMAL HIGH (ref 11.5–15.5)
WBC: 6.3 10*3/uL (ref 4.0–10.5)
WBC: 7.8 10*3/uL (ref 4.0–10.5)
WBC: 9.2 10*3/uL (ref 4.0–10.5)

## 2011-09-17 LAB — DIFFERENTIAL
Eosinophils Relative: 1 % (ref 0–5)
Lymphocytes Relative: 9 % — ABNORMAL LOW (ref 12–46)
Lymphs Abs: 0.8 10*3/uL (ref 0.7–4.0)
Monocytes Relative: 9 % (ref 3–12)

## 2011-09-17 LAB — COMPREHENSIVE METABOLIC PANEL
ALT: 38 U/L (ref 0–53)
AST: 22 U/L (ref 0–37)
Albumin: 3.3 g/dL — ABNORMAL LOW (ref 3.5–5.2)
CO2: 27 mEq/L (ref 19–32)
Calcium: 9.1 mg/dL (ref 8.4–10.5)
Chloride: 102 mEq/L (ref 96–112)
GFR calc Af Amer: >60 mL/min (ref >60)
GFR calc non Af Amer: 51 mL/min — ABNORMAL LOW (ref >60)
Sodium: 137 mEq/L (ref 135–145)

## 2011-09-17 LAB — URINALYSIS, ROUTINE W REFLEX MICROSCOPIC
Glucose, UA: >1000 mg/dL — AB
Hgb urine dipstick: NEGATIVE
Leukocytes, UA: NEGATIVE
Protein, ur: NEGATIVE mg/dL
pH: 6 (ref 5.0–8.0)

## 2011-09-17 LAB — POCT CARDIAC MARKERS
Myoglobin, poc: 259 ng/mL (ref 12–200)
Troponin i, poc: <0.05 ng/mL (ref 0.00–0.09)

## 2011-09-17 LAB — CARDIAC PANEL(CRET KIN+CKTOT+MB+TROPI)
CK, MB: 2.1 ng/mL (ref 0.3–4.0)
Relative Index: 3.6 — ABNORMAL HIGH (ref 0.0–2.5)
Relative Index: INVALID (ref 0.0–2.5)
Total CK: 123 U/L (ref 7–232)
Total CK: 82 U/L (ref 7–232)
Troponin I: 0.02 ng/mL (ref 0.00–0.06)
Troponin I: 0.06 ng/mL (ref 0.00–0.06)

## 2011-09-17 LAB — LIPID PANEL
Cholesterol: 105 mg/dL (ref 0–200)
HDL: 20 mg/dL — ABNORMAL LOW (ref >39)
Total CHOL/HDL Ratio: 5.3 RATIO
VLDL: 51 mg/dL — ABNORMAL HIGH (ref 0–40)

## 2011-09-17 LAB — HEMOGLOBIN A1C
Hgb A1c MFr Bld: 8.9 % — ABNORMAL HIGH (ref 4.6–6.1)
Mean Plasma Glucose: 209 mg/dL

## 2011-09-17 LAB — URINE MICROSCOPIC-ADD ON

## 2011-09-17 LAB — TROPONIN I: Troponin I: 0.04 ng/mL (ref 0.00–0.06)

## 2011-09-17 LAB — HEPARIN LEVEL (UNFRACTIONATED): Heparin Unfractionated: 0.21 IU/mL — ABNORMAL LOW (ref 0.30–0.70)

## 2011-09-25 LAB — PROTIME-INR
INR: 1
Prothrombin Time: 13

## 2011-09-25 LAB — BASIC METABOLIC PANEL
BUN: 17
CO2: 25
CO2: 29
Calcium: 9.6
Chloride: 106
Chloride: 106
Creatinine, Ser: 1.37
GFR calc Af Amer: >60
GFR calc Af Amer: >60
Glucose, Bld: 151 — ABNORMAL HIGH
Sodium: 139

## 2011-09-25 LAB — CBC
HCT: 39.5
Hemoglobin: 13.5
MCHC: 33.9
MCHC: 34.2
MCV: 105 — ABNORMAL HIGH
MCV: 105.1 — ABNORMAL HIGH
Platelets: 169
RBC: 3.77 — ABNORMAL LOW
RBC: 4.07 — ABNORMAL LOW
RDW: 15.8 — ABNORMAL HIGH
WBC: 8.5

## 2011-09-25 LAB — CK TOTAL AND CKMB (NOT AT ARMC): Total CK: 65

## 2011-09-28 LAB — CBC
HCT: 42.7
Hemoglobin: 14.5
MCHC: 33.9
RBC: 4.05 — ABNORMAL LOW

## 2011-09-28 LAB — BASIC METABOLIC PANEL
CO2: 26
Calcium: 9.6
Chloride: 102
GFR calc Af Amer: >60
Potassium: 4.2
Sodium: 138

## 2011-09-28 LAB — PROTIME-INR: INR: 1

## 2011-11-27 IMAGING — CT CT CHEST W/O CM
2 of 6 series · 8 of 36 positions shown, 10 images · non-contrast
Comparison: None

CLINICAL DATA: Interstitial lung disease

CT CHEST WITHOUT CONTRAST
TECHNIQUE: Multidetector CT imaging of the chest was performed
following the standard protocol without IV contrast.

[Series 6: high res lung 1x5 · axial · 0.79mm/px · z∈[-272,-62]mm · 5 of 33 slices shown, 7 images]
[im 6/33  mediastinal]
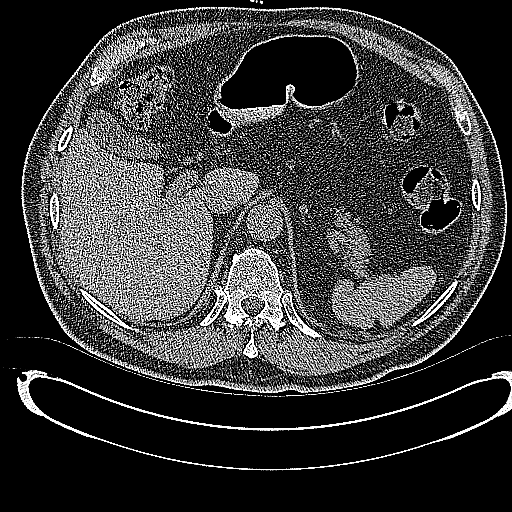
[im 6/33  lung]
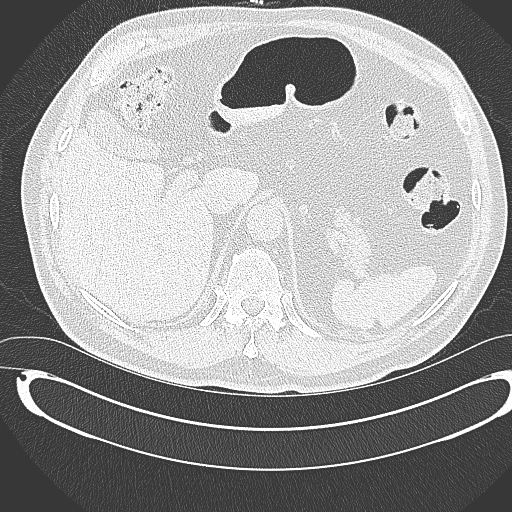
[im 11/33  lung]
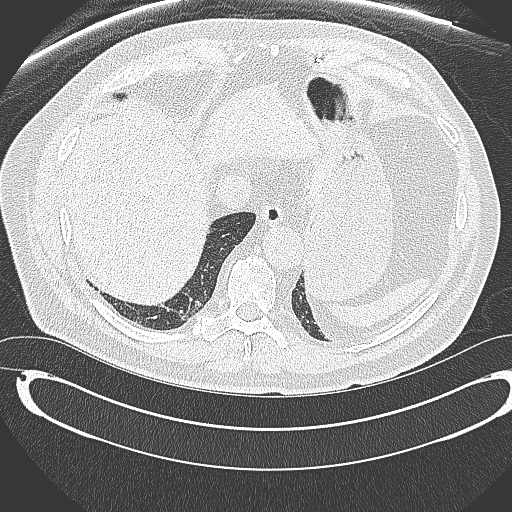
[im 17/33  lung]
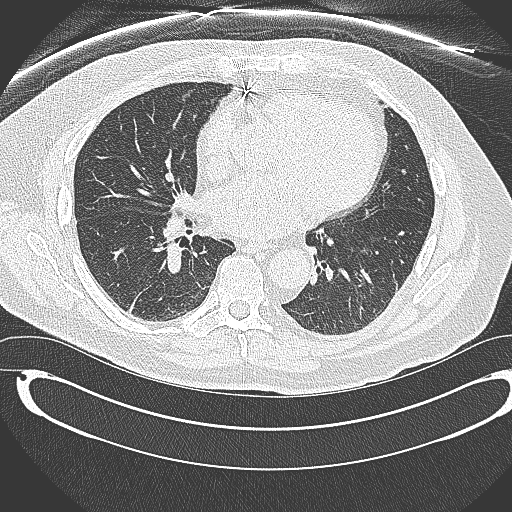
[im 22/33  lung]
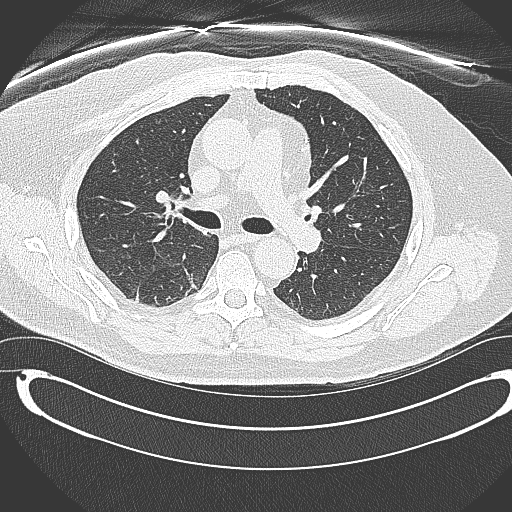
[im 27/33  mediastinal]
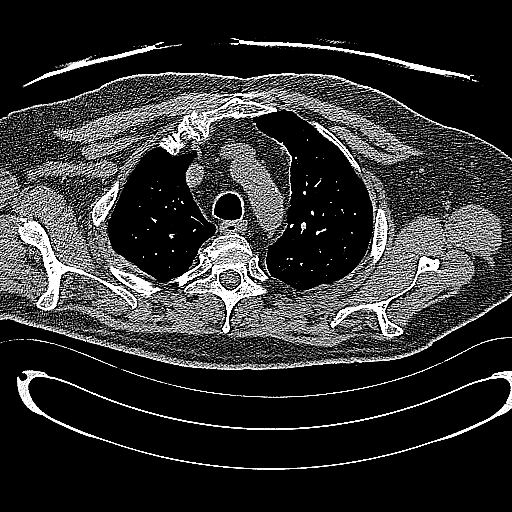
[im 27/33  lung]
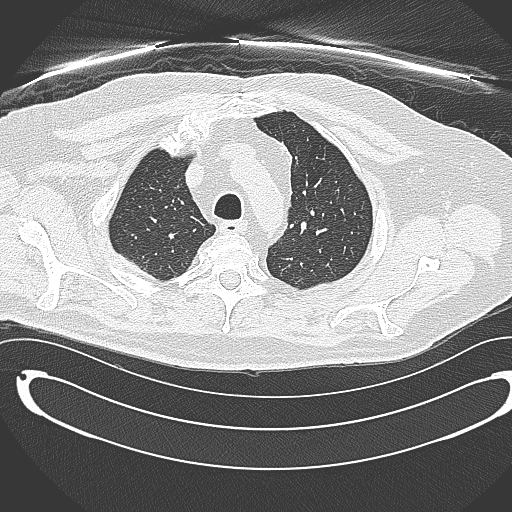

[Series 602: <mpr thick range> · coronal · 0.79mm/px · 3 of 134 slices shown]
[im 27/134  lung]
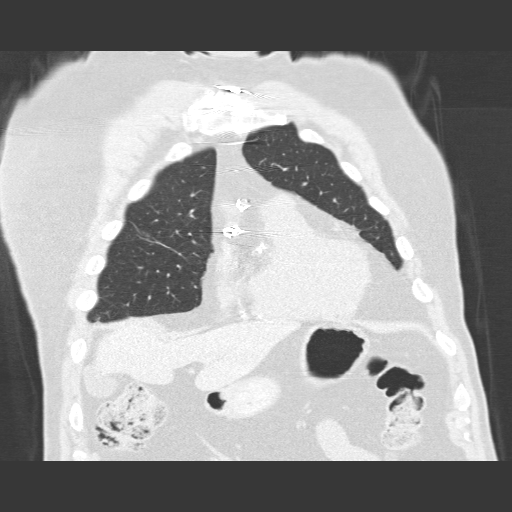
[im 54/134  lung]
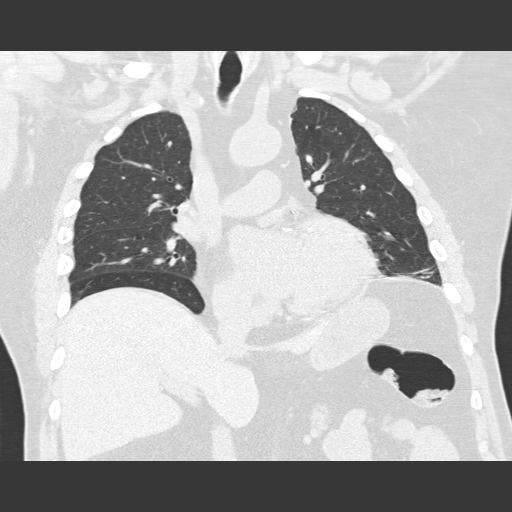
[im 80/134  lung]
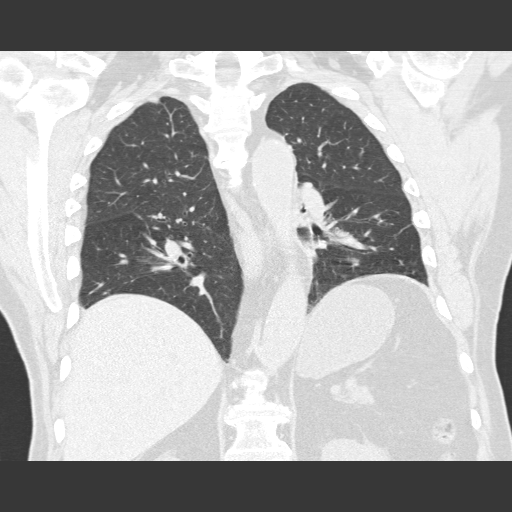

[8 of 36 positions shown; findings below may reference images not displayed]

FINDINGS: No enlarged axillary or supraclavicular lymph nodes.

There is no enlarged mediastinal or hilar lymph nodes.

No pericardial or pleural effusions.

Scar like densities are noted within both lung bases.

Within the left upper lobe there is a small nodule which measures
3.3 mm, image 14.

High resolution images were reviewed.  No bronchiectasis
identified.  There is no honeycombing identified.  On the
expiration views there are no significant areas of air trapping.

Review of the visualized osseous structures shows multilevel
spondylosis.
IMPRESSION: 1.  Bilateral lower lobe scarring.
2.  No evidence for bronchiectasis, honeycombing or air trapping.

## 2012-07-22 ENCOUNTER — Encounter: Payer: Self-pay | Admitting: Internal Medicine

## 2012-07-22 ENCOUNTER — Encounter: Payer: Self-pay | Admitting: *Deleted

## 2012-12-20 ENCOUNTER — Ambulatory Visit (INDEPENDENT_AMBULATORY_CARE_PROVIDER_SITE_OTHER): Payer: Medicare Other | Admitting: Pulmonary Disease

## 2012-12-20 ENCOUNTER — Encounter: Payer: Self-pay | Admitting: Pulmonary Disease

## 2012-12-20 VITALS — BP 108/72 | HR 72 | Temp 98.0°F | Ht 71.0 in | Wt 215.4 lb

## 2012-12-20 DIAGNOSIS — R0602 Shortness of breath: Secondary | ICD-10-CM

## 2012-12-20 NOTE — Assessment & Plan Note (Signed)
The patient has worsening dyspnea on exertion since his last visit with me in 2011.  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  He also had a known cardiomyopathy at that time.  The patient feels that his symptoms are much worse, however his lungs are fairly clear today.  I see mention in the record a history of rheumatoid lung disease, however there was no evidence for this by CT chest in 2011.  He has had a recent chest x-ray, and I will get the results of this to see if he will need a high-resolution CT to evaluate for interstitial lung disease.  If there is no evidence for interstitial disease, I suspect his biggest issue is his severe deconditioning and also his underlying cardiomyopathy.  The patient is extremely weak in the exam room today, and can barely get onto the exam table.  I think he would greatly benefit from cardiac rehabilitation, and perhaps his cardiologist can refer him.  I have discussed this with his wife.

## 2012-12-20 NOTE — Progress Notes (Signed)
  Subjective:    Patient ID: [name], male    DOB: 08/28/1941, 72 y.o.   MRN: [id number]  HPI The patient comes in today for an acute sick visit. He has not been seen since 2012, and at that time he was evaluated for dyspnea on exertion and felt to be multifactorial.  He had restriction on PFTs probably secondary to his paralyzed left hemidiaphragm, but also had a known cardiomyopathy.  He has a history of rheumatoid arthritis, but we have never documented any interstitial lung disease on imaging.  He comes in today where he is seen significant worsening of his breathing since the last visit.  He has had a catheterization in 2012 after the last visit which showed an EF of 20-25%.  He is clearly very deconditioned, and can barely get from his chair onto the exam table today.  He denies any cough, congestion, or purulent mucus.  His oxygen saturations are excellent at rest.  Review of Systems  Constitutional: Negative for fever and unexpected weight change.  HENT: Positive for rhinorrhea. Negative for ear pain, nosebleeds, congestion, sore throat, sneezing, trouble swallowing, dental problem, postnasal drip and sinus pressure.   Eyes: Negative for redness and itching.  Respiratory: Positive for shortness of breath. Negative for cough, chest tightness and wheezing.   Cardiovascular: Positive for palpitations ( A-fib). Negative for leg swelling.  Gastrointestinal: Negative for nausea and vomiting.  Genitourinary: Negative for dysuria.  Musculoskeletal: Negative for joint swelling.  Skin: Negative for rash.  Neurological: Negative for headaches.  Hematological: Does not bruise/bleed easily.  Psychiatric/Behavioral: Negative for dysphoric mood. The patient is not nervous/anxious.        Objective:   Physical Exam Well-developed male who appears very weak.  No acute distress Nose without purulence or discharge noted Oropharynx clear Neck without lymphadenopathy or thyromegaly Chest with  decreased breath sounds in the left base, otherwise totally clear. Cardiac exam with irregular rhythm, controlled ventricular response Lower extremities with mild ankle edema, no cyanosis Alert and oriented, moves all 4 extremities.       Assessment & Plan:

## 2012-12-20 NOTE — Patient Instructions (Addendum)
Will get your recent cxr from your [facility].  If there is a suggestion of scarring, will check scan of chest.  If there is not scarring, would recommend your cardiologist referring you to cardiac rehab at [place].  I will call and let you know after reviewing.

## 2012-12-21 ENCOUNTER — Other Ambulatory Visit (HOSPITAL_COMMUNITY): Payer: Self-pay | Admitting: Cardiovascular Disease

## 2012-12-21 DIAGNOSIS — I2510 Atherosclerotic heart disease of native coronary artery without angina pectoris: Secondary | ICD-10-CM

## 2013-01-11 ENCOUNTER — Emergency Department (HOSPITAL_COMMUNITY): Payer: Medicare Other

## 2013-01-11 ENCOUNTER — Encounter (HOSPITAL_COMMUNITY): Payer: Self-pay

## 2013-01-11 ENCOUNTER — Inpatient Hospital Stay (HOSPITAL_COMMUNITY)
Admission: EM | Admit: 2013-01-11 | Discharge: 2013-01-15 | DRG: 287 | Disposition: A | Discharge status: Home / Self Care | Payer: Medicare Other | Attending: Cardiovascular Disease | Admitting: Cardiovascular Disease

## 2013-01-11 DIAGNOSIS — I214 Non-ST elevation (NSTEMI) myocardial infarction: Secondary | ICD-10-CM | POA: Diagnosis present

## 2013-01-11 DIAGNOSIS — I25719 Atherosclerosis of autologous vein coronary artery bypass graft(s) with unspecified angina pectoris: Secondary | ICD-10-CM | POA: Diagnosis present

## 2013-01-11 DIAGNOSIS — J441 Chronic obstructive pulmonary disease with (acute) exacerbation: Secondary | ICD-10-CM | POA: Diagnosis present

## 2013-01-11 DIAGNOSIS — I2589 Other forms of chronic ischemic heart disease: Secondary | ICD-10-CM | POA: Diagnosis present

## 2013-01-11 DIAGNOSIS — I472 Ventricular tachycardia, unspecified: Secondary | ICD-10-CM | POA: Diagnosis not present

## 2013-01-11 DIAGNOSIS — I4891 Unspecified atrial fibrillation: Secondary | ICD-10-CM | POA: Diagnosis present

## 2013-01-11 DIAGNOSIS — R079 Chest pain, unspecified: Secondary | ICD-10-CM

## 2013-01-11 DIAGNOSIS — Z79899 Other long term (current) drug therapy: Secondary | ICD-10-CM

## 2013-01-11 DIAGNOSIS — T82897A Other specified complication of cardiac prosthetic devices, implants and grafts, initial encounter: Secondary | ICD-10-CM | POA: Diagnosis present

## 2013-01-11 DIAGNOSIS — I4821 Permanent atrial fibrillation: Secondary | ICD-10-CM | POA: Diagnosis present

## 2013-01-11 DIAGNOSIS — I252 Old myocardial infarction: Secondary | ICD-10-CM

## 2013-01-11 DIAGNOSIS — J209 Acute bronchitis, unspecified: Secondary | ICD-10-CM

## 2013-01-11 DIAGNOSIS — I219 Acute myocardial infarction, unspecified: Secondary | ICD-10-CM

## 2013-01-11 DIAGNOSIS — I25810 Atherosclerosis of coronary artery bypass graft(s) without angina pectoris: Secondary | ICD-10-CM | POA: Diagnosis present

## 2013-01-11 DIAGNOSIS — Z7902 Long term (current) use of antithrombotics/antiplatelets: Secondary | ICD-10-CM

## 2013-01-11 DIAGNOSIS — I10 Essential (primary) hypertension: Secondary | ICD-10-CM | POA: Diagnosis present

## 2013-01-11 DIAGNOSIS — Z7901 Long term (current) use of anticoagulants: Secondary | ICD-10-CM

## 2013-01-11 DIAGNOSIS — I2510 Atherosclerotic heart disease of native coronary artery without angina pectoris: Secondary | ICD-10-CM

## 2013-01-11 DIAGNOSIS — I200 Unstable angina: Secondary | ICD-10-CM | POA: Diagnosis present

## 2013-01-11 DIAGNOSIS — I259 Chronic ischemic heart disease, unspecified: Secondary | ICD-10-CM

## 2013-01-11 DIAGNOSIS — E119 Type 2 diabetes mellitus without complications: Secondary | ICD-10-CM | POA: Diagnosis present

## 2013-01-11 DIAGNOSIS — E785 Hyperlipidemia, unspecified: Secondary | ICD-10-CM | POA: Diagnosis present

## 2013-01-11 DIAGNOSIS — Z7982 Long term (current) use of aspirin: Secondary | ICD-10-CM

## 2013-01-11 DIAGNOSIS — IMO0002 Reserved for concepts with insufficient information to code with codable children: Secondary | ICD-10-CM

## 2013-01-11 DIAGNOSIS — Y849 Medical procedure, unspecified as the cause of abnormal reaction of the patient, or of later complication, without mention of misadventure at the time of the procedure: Secondary | ICD-10-CM | POA: Diagnosis present

## 2013-01-11 DIAGNOSIS — I4729 Other ventricular tachycardia: Secondary | ICD-10-CM | POA: Diagnosis not present

## 2013-01-11 DIAGNOSIS — I255 Ischemic cardiomyopathy: Secondary | ICD-10-CM

## 2013-01-11 HISTORY — PX: CARDIAC CATHETERIZATION: SHX172

## 2013-01-11 HISTORY — DX: Ischemic cardiomyopathy: I25.5

## 2013-01-11 LAB — BASIC METABOLIC PANEL
CO2: 23 mEq/L (ref 19–32)
Calcium: 9.4 mg/dL (ref 8.4–10.5)
Glucose, Bld: 313 mg/dL — ABNORMAL HIGH (ref 70–99)
Sodium: 135 mEq/L (ref 135–145)

## 2013-01-11 LAB — CBC
Hemoglobin: 16.5 g/dL (ref 13.0–17.0)
MCH: 34.1 pg — ABNORMAL HIGH (ref 26.0–34.0)
MCV: 99.8 fL (ref 78.0–100.0)
RBC: 4.84 MIL/uL (ref 4.22–5.81)

## 2013-01-11 LAB — URINALYSIS, ROUTINE W REFLEX MICROSCOPIC
Bilirubin Urine: NEGATIVE
Ketones, ur: NEGATIVE mg/dL
Nitrite: NEGATIVE
Protein, ur: NEGATIVE mg/dL
Specific Gravity, Urine: 1.019 (ref 1.005–1.030)
Urobilinogen, UA: 0.2 mg/dL (ref 0.0–1.0)

## 2013-01-11 LAB — APTT: aPTT: 34 seconds (ref 24–37)

## 2013-01-11 LAB — PROTIME-INR
INR: 1.93 — ABNORMAL HIGH (ref 0.00–1.49)
Prothrombin Time: 21.3 seconds — ABNORMAL HIGH (ref 11.6–15.2)

## 2013-01-11 LAB — POCT I-STAT TROPONIN I

## 2013-01-11 MED ORDER — NITROGLYCERIN IN D5W 200-5 MCG/ML-% IV SOLN
5.0000 ug/min | Freq: Once | INTRAVENOUS | Status: AC
  Administered 2013-01-11: 5 ug/min via INTRAVENOUS
  Filled 2013-01-11: qty 250

## 2013-01-11 MED ORDER — MORPHINE SULFATE 2 MG/ML IJ SOLN: 2.0000 mg | Freq: Once | INTRAMUSCULAR | Status: DC

## 2013-01-11 MED ORDER — HEPARIN SODIUM (PORCINE) 5000 UNIT/ML IJ SOLN: 4000.0000 [IU] | Freq: Once | INTRAMUSCULAR | Status: DC

## 2013-01-11 MED ORDER — MORPHINE SULFATE 4 MG/ML IJ SOLN
4.0000 mg | Freq: Once | INTRAMUSCULAR | Status: AC
  Administered 2013-01-11: 4 mg via INTRAVENOUS
  Filled 2013-01-11: qty 1

## 2013-01-11 MED ORDER — METOPROLOL TARTRATE 25 MG PO TABS
25.0000 mg | ORAL_TABLET | Freq: Once | ORAL | Status: AC
  Administered 2013-01-11: 25 mg via ORAL
  Filled 2013-01-11: qty 1

## 2013-01-11 MED ORDER — HEPARIN (PORCINE) IN NACL 100-0.45 UNIT/ML-% IJ SOLN
1500.0000 [IU]/h | INTRAMUSCULAR | Status: DC
  Administered 2013-01-11: 1300 [IU]/h via INTRAVENOUS
  Administered 2013-01-12 – 2013-01-13 (×2): 1500 [IU]/h via INTRAVENOUS
  Filled 2013-01-11 (×5): qty 250

## 2013-01-11 NOTE — ED Provider Notes (Signed)
I saw and evaluated the patient, reviewed the resident's note and I agree with the findings and plan.  Patient seen by me. EKG also reviewed and agree with the resident's findings. Patient with the shortness of breath and chest pain chest pain could be an unstable angina type picture. Discussed with Dr. Tresa Endo he is followed by [facility] heart and vascular. He will be admitted. Treated here for CHF and unstable angina as per cardiology recommendations. Patient started on nitroglycerin drip which will remain at 5 mics disease not currently having chest pain pages when he walks. Patient was going to be admitted to step down to admit to telemetry since a nitro drip we'll not be titrated.   CRITICAL CARE Performed by: Shelda Jakes.   Total critical care time: 30  Critical care time was exclusive of separately billable procedures and treating other patients.  Critical care was necessary to treat or prevent imminent or life-threatening deterioration.  Critical care was time spent personally by me on the following activities: development of treatment plan with patient and/or surrogate as well as nursing, discussions with consultants, evaluation of patient's response to treatment, examination of patient, obtaining history from patient or surrogate, ordering and performing treatments and interventions, ordering and review of laboratory studies, ordering and review of radiographic studies, pulse oximetry and re-evaluation of patient's condition.    Shelda Jakes, MD 01/11/13 [phone]

## 2013-01-11 NOTE — Progress Notes (Signed)
ANTICOAGULATION CONSULT NOTE - Initial Consult  Pharmacy Consult for Heparin Indication: chest pain/ACS  Allergies  Allergen Reactions  . Clarithromycin Other (See Comments)    Unknown, thinks "blisters in mouth"  . Influenza Vaccines Other (See Comments)    Severe coughing for weeks after vaccine; also had flu like symptoms for about 7 to 8 weeks.   . Lisinopril Other (See Comments)    cough    Patient Measurements: Height: 5\' 11"  (180.3 cm) Weight: 213 lb 13.5 oz (97 kg) IBW/kg (Calculated) : 75.3  Heparin Dosing Weight: 95 kg  Vital Signs: Temp: 97.9 F (36.6 C) (01/29 1607) Temp src: Axillary (01/29 1607) BP: 117/78 mmHg (01/29 2000) Pulse Rate: 86  (01/29 2000)  Labs:  Basename 01/11/13 1835 01/11/13 1612  HGB -- 16.5  HCT -- 48.3  PLT -- 169  APTT 34 --  LABPROT 21.3* --  INR 1.93* --  HEPARINUNFRC -- --  CREATININE -- 1.51*  CKTOTAL -- --  CKMB -- --  TROPONINI -- --    Estimated Creatinine Clearance: 51.8 ml/min (by C-G formula based on Cr of 1.51).   Medical History: Past Medical History  Diagnosis Date  . Pleuritis     h/o------ with negative vats biopsy  . Acute myocardial infarction, unspecified site, episode of care unspecified   . Hypertension   . Other and unspecified hyperlipidemia   . Coronary artery disease   . A-fib     persistant  . Chronic ischemic heart disease, unspecified   . Diabetes mellitus   . 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 Male presents with chest pain, SOB. Noted to have significant cardiac history. On coumadin PTA for afib. Baseline INR 1.93. MD order to start heparin for r/o ACS.  Goal of Therapy:  Heparin level 0.3-0.7 units/ml Monitor platelets by anticoagulation protocol: Yes   Plan:  1. Heparin gtt at 1300 units/hr. No bolus 2. Check heparin level and CBC in 8 hours 3. Daily heparin level and  CBC  Christoper Fabian, PharmD, BCPS Clinical pharmacist, pager [phone] 01/11/2013,10:05 PM

## 2013-01-11 NOTE — ED Notes (Signed)
Pt c/o SOB and mid cp since yesterday. Hx of Afib. Denies any other symptoms.

## 2013-01-11 NOTE — H&P (Signed)
Chief Complaint:  cp  HPI: 72 yo male with 2 weeks of waxing/waning sscp with associated sob no radiation that is exertional and better with rest.  Usually relieved with sl ntg at home but over the last week this has really not be as effective.  Saw his cardiologist dr berry about 3 weeks ago and at that time was not having this cp.  cabg in 1998.  No le edema.  No fevers.  No n/v.  Is on ntg gtt here in ED and currently cp free.  Review of Systems:  O/w neg except per HPI  Past Medical History: Past Medical History  Diagnosis Date  . Pleuritis     h/o------ with negative vats biopsy  . Acute myocardial infarction, unspecified site, episode of care unspecified   . Hypertension   . Other and unspecified hyperlipidemia   . Coronary artery disease   . A-fib     persistant  . Chronic ischemic heart disease, unspecified   . Diabetes mellitus   . 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 Bypass graft   . Lung surgery   . Knee surgery   . Carpal tunnel release   . Cataract extraction     Medications: Prior to Admission medications   Medication Sig Start Date End Date Taking? Authorizing Provider  acetaminophen (TYLENOL) 650 MG CR tablet Take 650 mg by mouth every 8 (eight) hours as needed. For pain   Yes Historical Provider, MD  aspirin 81 MG tablet Take 81 mg by mouth daily.   Yes Historical Provider, MD  atorvastatin (LIPITOR) 40 MG tablet Take 40 mg by mouth daily.   Yes Historical Provider, MD  calcium carbonate (OS-CAL) 600 MG TABS Take 600 mg by mouth daily.   Yes Historical Provider, MD  clopidogrel (PLAVIX) 75 MG tablet Take 75 mg by mouth daily.   Yes Historical Provider, MD  diltiazem (DILACOR XR) 240 MG 24 hr capsule Take 240 mg by mouth daily.   Yes Historical Provider, MD  ezetimibe (ZETIA) 10 MG tablet Take 10 mg by mouth daily.   Yes Historical Provider, MD  fluticasone (FLONASE) 50  MCG/ACT nasal spray Place 2 sprays into both nostrils daily.    Yes Historical Provider, MD  furosemide (LASIX) 40 MG tablet Take 40 mg by mouth daily.   Yes Historical Provider, MD  isosorbide mononitrate (IMDUR) 60 MG 24 hr tablet Take 60 mg by mouth daily.   Yes Historical Provider, MD  leflunomide (ARAVA) 20 MG tablet Take 20 mg by mouth daily.   Yes Historical Provider, MD  levothyroxine (SYNTHROID, LEVOTHROID) 175 MCG tablet Take 175 mcg by mouth daily.   Yes Historical Provider, MD  losartan (COZAAR) 25 MG tablet Take 12.5 mg by mouth daily.   Yes Historical Provider, MD  metoprolol tartrate (LOPRESSOR) 25 MG tablet Take 25 mg by mouth 2 (two) times daily.   Yes Historical Provider, MD  nitroGLYCERIN (NITROSTAT) 0.4 MG SL tablet Place 0.4 mg under the tongue every 5 (five) minutes as needed.   Yes Historical Provider, MD  Omega-3 Fatty Acids (FISH OIL) 1000 MG CAPS Take 3,000 mg by mouth every morning.   Yes Historical Provider, MD  Omeprazole (PRILOSEC PO) Take 1 tablet by mouth every morning.   Yes Historical Provider, MD  predniSONE (DELTASONE) 5 MG tablet Take 5 mg by mouth daily.   Yes Historical Provider, MD  Saxagliptin-Metformin (KOMBIGLYZE XR)  2.04-999 MG TB24 Take 1 tablet by mouth 2 (two) times daily.   Yes Historical Provider, MD  warfarin (COUMADIN) 5 MG tablet Take 5 mg by mouth daily. Takes 7.5 mg-Sun,Tues,Wed,Thurs,Sat Takes 5 mg-Mon, Wed   Yes Historical Provider, MD    Allergies:   Allergies  Allergen Reactions  . Clarithromycin Other (See Comments)    Unknown, thinks "blisters in mouth"  . Influenza Vaccines Other (See Comments)    Severe coughing for weeks after vaccine; also had flu like symptoms for about 7 to 8 weeks.   . Lisinopril Other (See Comments)    cough    Social History: Neg x 3  Family History: Family History  Problem Relation Age of Onset  . Heart disease Mother   . Heart disease Father   . Heart disease Brother   . Heart disease Maternal  Aunt   . Cancer      Physical Exam: Filed Vitals:   01/11/13 1815 01/11/13 1930 01/11/13 2000 01/11/13 2100  BP: 119/87 117/79 117/78   Pulse: 53 85 86   Temp:      TempSrc:      Resp: 30 25 20    Height:    5\' 11"  (1.803 m)  Weight:    97 kg (213 lb 13.5 oz)  SpO2: 98% 96% 96%    General appearance: alert, cooperative and no distress Neck: no JVD and supple, symmetrical, trachea midline Lungs: clear to auscultation bilaterally Heart: regular rate and rhythm, S1, S2 normal, no murmur, click, rub or gallop Abdomen: soft, non-tender; bowel sounds normal; no masses,  no organomegaly Extremities: extremities normal, atraumatic, no cyanosis or edema Pulses: 2+ and symmetric Skin: Skin color, texture, turgor normal. No rashes or lesions Neurologic: Grossly normal    Labs on Admission:   [facility] 01/11/13 1612  NA 135  K 5.1  CL 95*  CO2 23  GLUCOSE 313*  BUN 27*  CREATININE 1.51*  CALCIUM 9.4  MG --  PHOS --    Basename 01/11/13 1612  WBC 15.2*  NEUTROABS --  HGB 16.5  HCT 48.3  MCV 99.8  PLT 169    Radiological Exams on Admission: Dg Chest 2 View  01/11/2013  *RADIOLOGY REPORT*  Clinical Data: Chest pain and shortness of breath.  CHEST - 2 VIEW  Comparison: Two-view chest 03/31/2011.  Findings: The heart size is normal.  Mild prominence of pulmonary arteries is stable.  Scarring at the lung bases is similar to the prior exam.  The patient is status post median sternotomy for CABG. The visualized soft tissues and bony thorax are otherwise unremarkable.  IMPRESSION:  1.  No acute cardiopulmonary disease or significant interval change. 2.  Status post CABG. 3.  Stable scarring at the lung bases bilaterally.   Original Report Authenticated By: Marin Roberts, M.D.     Assessment/Plan 72 yo male with [place] h/o cabg/cad  Principal Problem:  *Unstable angina Active Problems:  HYPERTENSION  CORONARY ARTERY DISEASE  ISCHEMIC HEART DISEASE  ATRIAL FIBRILLATION, HX  OF  ekg afib.  Cp free on ntg gtt.  Also on hep gtt.  Continue both.  [facility] already called and will see in am. Cont to serial cardiac enzymes.  No evidence of chf at this time.  Tele.    Loran Fleet A 01/11/2013, 11:40 PM

## 2013-01-11 NOTE — ED Provider Notes (Signed)
History     CSN: [id number]  Arrival date & time 01/11/13  1602   First MD Initiated Contact with Patient 01/11/13 1800      Chief Complaint  Patient presents with  . Chest Pain  . Shortness of Breath    (Consider location/radiation/quality/duration/timing/severity/associated sxs/prior treatment) HPI Comments: 72 y/o male h/o MI (CABG), A. Fib, chronic lung dz(?h/o empyema s/p surgery x2), DM p/w chest pain and SOB. Has intermittent chest pain and SOB at baseline. Worsening past week. Especially today. Central chest pain. Achy. Non-radiating. No diaphoresis. NTG x6 at home with improvement of symptoms. Generalized weakness. Worsening SOB with exertion. No fevers. Chronic unchanged cough.  Patient is a 72 y.o. male presenting with chest pain. The history is provided by the patient.  Chest Pain Episode onset: past few days. Chest pain occurs intermittently. The chest pain is worsening. The pain is associated with exertion. The severity of the pain is severe. The quality of the pain is described as aching. The pain does not radiate. Chest pain is worsened by exertion. Primary symptoms include cough (chronic). Pertinent negatives for primary symptoms include no fever, no shortness of breath, no abdominal pain, no nausea, no vomiting and no dizziness.     Past Medical History  Diagnosis Date  . Pleuritis     h/o------ with negative vats biopsy  . Acute myocardial infarction, unspecified site, episode of care unspecified   . Hypertension   . Other and unspecified hyperlipidemia   . Coronary artery disease   . A-fib     persistant  . Chronic ischemic heart disease, unspecified   . Diabetes mellitus   . 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 Bypass graft   . Lung surgery   . Knee surgery   . Carpal tunnel release   . Cataract extraction     Family History  Problem Relation Age of Onset  . Heart  disease Mother   . Heart disease Father   . Heart disease Brother   . Heart disease Maternal Aunt   . Cancer      History  Substance Use Topics  . Smoking status: Never Smoker   . Smokeless tobacco: Not on file  . Alcohol Use: No      Review of Systems  Constitutional: Negative for fever and chills.  HENT: Negative for congestion and rhinorrhea.   Respiratory: Positive for cough (chronic). Negative for shortness of breath.   Cardiovascular: Positive for chest pain. Negative for leg swelling.  Gastrointestinal: Negative for nausea, vomiting, abdominal pain and diarrhea.  Genitourinary: Negative for flank pain and difficulty urinating.  Musculoskeletal: Negative for back pain.  Skin: Negative for color change and rash.  Neurological: Negative for dizziness and headaches.  All other systems reviewed and are negative.    Allergies  Clarithromycin; Influenza vaccines; and Lisinopril  Home Medications   No current outpatient prescriptions on file.  BP 123/82  Pulse 49  Temp 97.9 F (36.6 C) (Axillary)  Resp 19  Ht 5\' 11"  (1.803 m)  Wt 213 lb 13.5 oz (97 kg)  BMI 29.83 kg/m2  SpO2 96%  Physical Exam  Nursing note and vitals reviewed. Constitutional: He is oriented to person, place, and time. He appears well-developed and well-nourished. No distress.  HENT:  Head: Normocephalic and atraumatic.  Eyes: Conjunctivae normal are normal. Right eye exhibits no discharge. Left eye exhibits no discharge.  Neck: No tracheal  deviation present.  Cardiovascular: Normal rate, normal heart sounds and intact distal pulses.   Pulmonary/Chest: Effort normal. No stridor. No respiratory distress. He has no wheezes. He has rales (bibasilar).  Abdominal: Soft. He exhibits no distension. There is no tenderness. There is no guarding.  Musculoskeletal: He exhibits edema. He exhibits no tenderness.  Neurological: He is alert and oriented to person, place, and time.  Skin: Skin is warm and  dry.  Psychiatric: He has a normal mood and affect. His behavior is normal.    ED Course  Procedures (including critical care time)  Labs Reviewed  CBC - Abnormal; Notable for the following:    WBC 15.2 (*)     MCH 34.1 (*)     RDW 15.8 (*)     All other components within normal limits  BASIC METABOLIC PANEL - Abnormal; Notable for the following:    Chloride 95 (*)     Glucose, Bld 313 (*)     BUN 27 (*)     Creatinine, Ser 1.51 (*)     GFR calc non Af Amer 44 (*)     GFR calc Af Amer 51 (*)     All other components within normal limits  PRO B NATRIURETIC PEPTIDE - Abnormal; Notable for the following:    Pro B Natriuretic peptide (BNP) 1774.0 (*)     All other components within normal limits  PROTIME-INR - Abnormal; Notable for the following:    Prothrombin Time 21.3 (*)     INR 1.93 (*)     All other components within normal limits  URINALYSIS, ROUTINE W REFLEX MICROSCOPIC - Abnormal; Notable for the following:    Glucose, UA 500 (*)     All other components within normal limits  POCT I-STAT TROPONIN I  APTT  HEPARIN LEVEL (UNFRACTIONATED)  TROPONIN I  TROPONIN I  TROPONIN I  CBC  BASIC METABOLIC PANEL  PROTIME-INR   Dg Chest 2 View  01/11/2013  *RADIOLOGY REPORT*  Clinical Data: Chest pain and shortness of breath.  CHEST - 2 VIEW  Comparison: Two-view chest 03/31/2011.  Findings: The heart size is normal.  Mild prominence of pulmonary arteries is stable.  Scarring at the lung bases is similar to the prior exam.  The patient is status post median sternotomy for CABG. The visualized soft tissues and bony thorax are otherwise unremarkable.  IMPRESSION:  1.  No acute cardiopulmonary disease or significant interval change. 2.  Status post CABG. 3.  Stable scarring at the lung bases bilaterally.   Original Report Authenticated By: Marin Roberts, M.D.      1. Chest pain   2. Chronic ischemic heart disease, unspecified   3. Other and unspecified hyperlipidemia   4.  Unspecified essential hypertension   5. Unstable angina      Date: 01/11/2013  Rate: 109  Rhythm: atrial fibrillation  QRS Axis: left  Intervals: a. fib  ST/T Wave abnormalities: nonspecific T wave changes  Conduction Disutrbances:left anterior fascicular block  Narrative Interpretation:   Old EKG Reviewed: none available    MDM   72 y/o male h/o A. Fib, MI, DM, Lung dz p/w chest pain and SOB. Improved with NTG. EKG and troponin largely unremarkable. Concern for possible unstable angina. Discussed with Dr. Tresa Endo (cardiology). Plan for IV NTG, heparin, and lopressor 25mg  po. Admit to hospitalist overnight. Patient CP free after some morphine. Has had NTG x6 today with improvement PTA.  Labs and imaging reviewed by myself and considered in  medical decision making if ordered. Imaging interpreted by radiology.   Discussed case with Dr. Deretha Emory who is in agreement with assessment and plan.         Stevie Kern, MD 01/12/13 [phone]

## 2013-01-12 ENCOUNTER — Encounter (HOSPITAL_COMMUNITY): Payer: Self-pay | Admitting: Cardiology

## 2013-01-12 ENCOUNTER — Telehealth: Payer: Self-pay | Admitting: Pulmonary Disease

## 2013-01-12 DIAGNOSIS — I219 Acute myocardial infarction, unspecified: Secondary | ICD-10-CM

## 2013-01-12 DIAGNOSIS — I2589 Other forms of chronic ischemic heart disease: Secondary | ICD-10-CM

## 2013-01-12 DIAGNOSIS — J40 Bronchitis, not specified as acute or chronic: Secondary | ICD-10-CM | POA: Insufficient documentation

## 2013-01-12 DIAGNOSIS — J209 Acute bronchitis, unspecified: Secondary | ICD-10-CM

## 2013-01-12 DIAGNOSIS — I255 Ischemic cardiomyopathy: Secondary | ICD-10-CM | POA: Diagnosis present

## 2013-01-12 DIAGNOSIS — Z7901 Long term (current) use of anticoagulants: Secondary | ICD-10-CM

## 2013-01-12 DIAGNOSIS — I4891 Unspecified atrial fibrillation: Secondary | ICD-10-CM

## 2013-01-12 LAB — TROPONIN I
Troponin I: <0.3 ng/mL
Troponin I: <0.3 ng/mL

## 2013-01-12 LAB — TSH: TSH: 0.032 u[IU]/mL — ABNORMAL LOW (ref 0.350–4.500)

## 2013-01-12 LAB — CBC
HCT: 46.2 % (ref 39.0–52.0)
Hemoglobin: 15.4 g/dL (ref 13.0–17.0)
MCH: 32.8 pg (ref 26.0–34.0)
RBC: 4.69 MIL/uL (ref 4.22–5.81)

## 2013-01-12 LAB — HEPARIN LEVEL (UNFRACTIONATED)
Heparin Unfractionated: 0.25 [IU]/mL — ABNORMAL LOW (ref 0.30–0.70)
Heparin Unfractionated: 0.33 [IU]/mL (ref 0.30–0.70)

## 2013-01-12 LAB — PROTIME-INR
INR: 1.87 — ABNORMAL HIGH (ref 0.00–1.49)
Prothrombin Time: 20.8 s — ABNORMAL HIGH (ref 11.6–15.2)

## 2013-01-12 LAB — BASIC METABOLIC PANEL
CO2: 30 mEq/L (ref 19–32)
Calcium: 9.2 mg/dL (ref 8.4–10.5)
Glucose, Bld: 85 mg/dL (ref 70–99)
Potassium: 3.4 mEq/L — ABNORMAL LOW (ref 3.5–5.1)
Sodium: 143 mEq/L (ref 135–145)

## 2013-01-12 LAB — GLUCOSE, CAPILLARY: Glucose-Capillary: 127 mg/dL — ABNORMAL HIGH (ref 70–99)

## 2013-01-12 MED ORDER — METOPROLOL TARTRATE 25 MG PO TABS
25.0000 mg | ORAL_TABLET | Freq: Two times a day (BID) | ORAL | Status: DC
  Administered 2013-01-12 – 2013-01-15 (×8): 25 mg via ORAL
  Filled 2013-01-12 (×9): qty 1

## 2013-01-12 MED ORDER — ISOSORBIDE MONONITRATE ER 60 MG PO TB24
60.0000 mg | ORAL_TABLET | Freq: Every day | ORAL | Status: DC
  Administered 2013-01-12 – 2013-01-13 (×2): 60 mg via ORAL
  Filled 2013-01-12 (×3): qty 1

## 2013-01-12 MED ORDER — ASPIRIN 81 MG PO CHEW
324.0000 mg | CHEWABLE_TABLET | ORAL | Status: AC
  Administered 2013-01-12: 324 mg via ORAL
  Filled 2013-01-12: qty 4

## 2013-01-12 MED ORDER — LEVALBUTEROL HCL 0.63 MG/3ML IN NEBU
0.6300 mg | INHALATION_SOLUTION | Freq: Four times a day (QID) | RESPIRATORY_TRACT | Status: DC
  Administered 2013-01-12 (×3): 0.63 mg via RESPIRATORY_TRACT
  Filled 2013-01-12 (×5): qty 3

## 2013-01-12 MED ORDER — SODIUM CHLORIDE 0.9 % IV SOLN
250.0000 mL | INTRAVENOUS | Status: DC | PRN
  Administered 2013-01-13: 250 mL via INTRAVENOUS

## 2013-01-12 MED ORDER — LEFLUNOMIDE 20 MG PO TABS
20.0000 mg | ORAL_TABLET | Freq: Every day | ORAL | Status: DC
  Administered 2013-01-12 – 2013-01-15 (×4): 20 mg via ORAL
  Filled 2013-01-12 (×4): qty 1

## 2013-01-12 MED ORDER — LEVALBUTEROL HCL 0.63 MG/3ML IN NEBU
0.6300 mg | INHALATION_SOLUTION | Freq: Three times a day (TID) | RESPIRATORY_TRACT | Status: DC
  Administered 2013-01-13 – 2013-01-15 (×7): 0.63 mg via RESPIRATORY_TRACT
  Filled 2013-01-12 (×10): qty 3

## 2013-01-12 MED ORDER — WARFARIN - PHYSICIAN DOSING INPATIENT
Freq: Every day | Status: DC
  Administered 2013-01-12: 18:00:00

## 2013-01-12 MED ORDER — DILTIAZEM HCL ER 240 MG PO CP24
240.0000 mg | ORAL_CAPSULE | Freq: Every day | ORAL | Status: DC
  Administered 2013-01-12 – 2013-01-13 (×2): 240 mg via ORAL
  Filled 2013-01-12 (×4): qty 1

## 2013-01-12 MED ORDER — WARFARIN SODIUM 5 MG PO TABS
5.0000 mg | ORAL_TABLET | Freq: Every day | ORAL | Status: DC
  Administered 2013-01-12 – 2013-01-13 (×2): 5 mg via ORAL
  Filled 2013-01-12 (×2): qty 1

## 2013-01-12 MED ORDER — SODIUM CHLORIDE 0.9 % IV SOLN: 250.0000 mL | INTRAVENOUS | Status: DC | PRN

## 2013-01-12 MED ORDER — ASPIRIN 81 MG PO CHEW
324.0000 mg | CHEWABLE_TABLET | ORAL | Status: AC
  Administered 2013-01-13: 324 mg via ORAL
  Filled 2013-01-12: qty 4

## 2013-01-12 MED ORDER — LEVALBUTEROL HCL 0.63 MG/3ML IN NEBU: 0.6300 mg | INHALATION_SOLUTION | Freq: Three times a day (TID) | RESPIRATORY_TRACT | Status: DC

## 2013-01-12 MED ORDER — LEVOTHYROXINE SODIUM 175 MCG PO TABS
175.0000 ug | ORAL_TABLET | Freq: Every day | ORAL | Status: DC
  Administered 2013-01-12 – 2013-01-15 (×4): 175 ug via ORAL
  Filled 2013-01-12 (×4): qty 1

## 2013-01-12 MED ORDER — METHYLPREDNISOLONE SODIUM SUCC 125 MG IJ SOLR
60.0000 mg | Freq: Four times a day (QID) | INTRAMUSCULAR | Status: DC
  Administered 2013-01-12 – 2013-01-13 (×5): 60 mg via INTRAVENOUS
  Filled 2013-01-12: qty 0.96
  Filled 2013-01-12: qty 2
  Filled 2013-01-12 (×8): qty 0.96

## 2013-01-12 MED ORDER — PREDNISONE 5 MG PO TABS
5.0000 mg | ORAL_TABLET | Freq: Every day | ORAL | Status: DC
  Filled 2013-01-12: qty 1

## 2013-01-12 MED ORDER — ACETAMINOPHEN 325 MG PO TABS
650.0000 mg | ORAL_TABLET | ORAL | Status: DC | PRN
  Administered 2013-01-13: 650 mg via ORAL
  Filled 2013-01-12: qty 2

## 2013-01-12 MED ORDER — IPRATROPIUM BROMIDE 0.02 % IN SOLN
0.5000 mg | Freq: Three times a day (TID) | RESPIRATORY_TRACT | Status: DC
  Administered 2013-01-13 – 2013-01-15 (×7): 0.5 mg via RESPIRATORY_TRACT
  Filled 2013-01-12 (×8): qty 2.5

## 2013-01-12 MED ORDER — SODIUM CHLORIDE 0.9 % IV SOLN
1.0000 mL/kg/h | INTRAVENOUS | Status: DC
  Administered 2013-01-13: 1 mL/kg/h via INTRAVENOUS

## 2013-01-12 MED ORDER — SODIUM CHLORIDE 0.9 % IJ SOLN
3.0000 mL | Freq: Two times a day (BID) | INTRAMUSCULAR | Status: DC
  Administered 2013-01-13: 3 mL via INTRAVENOUS

## 2013-01-12 MED ORDER — DIAZEPAM 2 MG PO TABS
2.0000 mg | ORAL_TABLET | ORAL | Status: AC
  Administered 2013-01-13: 2 mg via ORAL
  Filled 2013-01-12: qty 1

## 2013-01-12 MED ORDER — LOSARTAN POTASSIUM 25 MG PO TABS
12.5000 mg | ORAL_TABLET | Freq: Every day | ORAL | Status: DC
  Administered 2013-01-12 – 2013-01-13 (×2): 12.5 mg via ORAL
  Filled 2013-01-12 (×3): qty 0.5

## 2013-01-12 MED ORDER — SODIUM CHLORIDE 0.9 % IJ SOLN: 3.0000 mL | Freq: Two times a day (BID) | INTRAMUSCULAR | Status: DC

## 2013-01-12 MED ORDER — NITROGLYCERIN IN D5W 200-5 MCG/ML-% IV SOLN: 3.0000 ug/min | INTRAVENOUS | Status: DC

## 2013-01-12 MED ORDER — ASPIRIN 300 MG RE SUPP
300.0000 mg | RECTAL | Status: AC
  Filled 2013-01-12: qty 1

## 2013-01-12 MED ORDER — EZETIMIBE 10 MG PO TABS
10.0000 mg | ORAL_TABLET | Freq: Every day | ORAL | Status: DC
  Administered 2013-01-12 – 2013-01-15 (×4): 10 mg via ORAL
  Filled 2013-01-12 (×4): qty 1

## 2013-01-12 MED ORDER — FUROSEMIDE 40 MG PO TABS
40.0000 mg | ORAL_TABLET | Freq: Every day | ORAL | Status: DC
  Administered 2013-01-12 – 2013-01-15 (×4): 40 mg via ORAL
  Filled 2013-01-12 (×4): qty 1

## 2013-01-12 MED ORDER — NITROGLYCERIN 0.4 MG SL SUBL: 0.4000 mg | SUBLINGUAL_TABLET | SUBLINGUAL | Status: DC | PRN

## 2013-01-12 MED ORDER — ATORVASTATIN CALCIUM 40 MG PO TABS
40.0000 mg | ORAL_TABLET | Freq: Every day | ORAL | Status: DC
  Administered 2013-01-12 – 2013-01-15 (×4): 40 mg via ORAL
  Filled 2013-01-12 (×4): qty 1

## 2013-01-12 MED ORDER — SODIUM CHLORIDE 0.9 % IJ SOLN: 3.0000 mL | INTRAMUSCULAR | Status: DC | PRN

## 2013-01-12 MED ORDER — CLOPIDOGREL BISULFATE 75 MG PO TABS
75.0000 mg | ORAL_TABLET | Freq: Every day | ORAL | Status: DC
  Administered 2013-01-12 – 2013-01-15 (×4): 75 mg via ORAL
  Filled 2013-01-12 (×4): qty 1

## 2013-01-12 MED ORDER — LEVALBUTEROL HCL 0.63 MG/3ML IN NEBU
0.6300 mg | INHALATION_SOLUTION | RESPIRATORY_TRACT | Status: DC | PRN
  Administered 2013-01-15: 0.63 mg via RESPIRATORY_TRACT
  Filled 2013-01-12: qty 3

## 2013-01-12 MED ORDER — ONDANSETRON HCL 4 MG/2ML IJ SOLN: 4.0000 mg | Freq: Four times a day (QID) | INTRAMUSCULAR | Status: DC | PRN

## 2013-01-12 MED ORDER — IPRATROPIUM BROMIDE 0.02 % IN SOLN
0.5000 mg | Freq: Four times a day (QID) | RESPIRATORY_TRACT | Status: DC
  Administered 2013-01-12 (×3): 0.5 mg via RESPIRATORY_TRACT
  Filled 2013-01-12 (×3): qty 2.5

## 2013-01-12 MED ORDER — ASPIRIN EC 81 MG PO TBEC
81.0000 mg | DELAYED_RELEASE_TABLET | Freq: Every day | ORAL | Status: DC
  Administered 2013-01-12 – 2013-01-15 (×3): 81 mg via ORAL
  Filled 2013-01-12 (×4): qty 1

## 2013-01-12 MED ORDER — ASPIRIN 81 MG PO CHEW
81.0000 mg | CHEWABLE_TABLET | Freq: Every day | ORAL | Status: DC
  Filled 2013-01-12: qty 1

## 2013-01-12 MED ORDER — IPRATROPIUM BROMIDE 0.02 % IN SOLN: 0.5000 mg | Freq: Four times a day (QID) | RESPIRATORY_TRACT | Status: DC

## 2013-01-12 NOTE — Progress Notes (Signed)
Utilization Review Completed.   Dellene Mcgroarty, RN, BSN Nurse Case Manager  [phone]  

## 2013-01-12 NOTE — Progress Notes (Signed)
ANTICOAGULATION CONSULT NOTE - Follow Up Consult  Pharmacy Consult for Heparin Indication: Afib + ACS  Allergies  Allergen Reactions  . Clarithromycin Other (See Comments)    Unknown, thinks "blisters in mouth"  . Influenza Vaccines Other (See Comments)    Severe coughing for weeks after vaccine; also had flu like symptoms for about 7 to 8 weeks.   . Lisinopril Other (See Comments)    cough    Patient Measurements: Height: 6' (182.9 cm) Weight: 205 lb 0.4 oz (93 kg) (scale c) IBW/kg (Calculated) : 77.6  Heparin Dosing Weight:    Vital Signs: Temp: 97.5 F (36.4 C) (01/30 0447) Temp src: Oral (01/30 0447) BP: 129/88 mmHg (01/30 0447) Pulse Rate: 75  (01/30 0447)  Labs:  Basename 01/12/13 0645 01/12/13 0643 01/12/13 0100 01/11/13 1835 01/11/13 1612  HGB -- -- -- -- 16.5  HCT -- -- -- -- 48.3  PLT -- -- -- -- 169  APTT -- -- -- 34 --  LABPROT 20.8* -- -- 21.3* --  INR 1.87* -- -- 1.93* --  HEPARINUNFRC 0.25* -- -- -- --  CREATININE -- -- -- -- 1.51*  CKTOTAL -- -- -- -- --  CKMB -- -- -- -- --  TROPONINI -- <0.30 <0.30 -- --    Estimated Creatinine Clearance: 47.8 ml/min (by C-G formula based on Cr of 1.51).  Assessment: Chest pain/SOB x 2 weeks  Anticoagulation: Coumadin PTA for afib. Admit INR 1.93 on 5mg  MW and 7.5mg  other days. Heparin to start for ACS. Troponins negative x 2. INR down to 1.87 this am. Heparin level low this am at 0.25.  Infectious Disease: Afebrile. WBC down to 11.1 but still elevated.  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 129/88, HR 75 Meds: ASA 81mg , Lipitor, Plavis, Diltiazem, Zetia, Lasix, Imdur, Losartan, metoprolol  Endocrinology: DM, CBGs 127, levothyroxine  Gastrointestinal / Nutrition:  Neurology: Arava for RA (no mention in PMH)  Nephrology: Scr 1.27 with CrCl 48  Pulmonary: autoimmune lung dz. On chronic prednisone.  Hematology / Oncology: CBC WNL  PTA Medication Issues: PPI , Oscal, Flonase, Lovaza,  Saxagliptin/Metformin not resumed  Best Practices :   Goal of Therapy:  Heparin level 0.3-0.7 units/ml Monitor platelets by anticoagulation protocol: Yes   Plan:  Increase IV heparin to 1500 units/hr. Recheck heparin level in 6 hrs.  Merilynn Finland, [employer] 01/12/2013,8:07 AM

## 2013-01-12 NOTE — Telephone Encounter (Signed)
Please let pt know that I have reviewed his recent cxr, and there is no evidence for significant scarring that may cause his increased shortness of breath.  I think he needs to work on his cardiac status and conditioning.

## 2013-01-12 NOTE — Progress Notes (Signed)
Patient ID: [name]  male  ION:[id number]    DOB: 1941/10/09    DOA: 01/11/2013  PCP: Thayer Headings, MD  Assessment/Plan: Principal Problem:  *Unstable angina: History of  Cardiomyopathy, ischemic EF 25%, CAD s/p CABG, CP better at the time of my exam  - on NTG gtt and heparin gtt,  On ASA, BB, zetia, plavix, CCB, imdur - Patient has been taking NTG S/L at home multiple times, has hx of CABG, negative troponins. - discussed with cardiology for consult, ?needs cardiac cath   Bronchitis, acute - Patient wheezing bilaterally, placed on scheduled nebulizer treatments with Xopenex and Atrovent, Solu-Medrol IV  Active Problems:  HYPERTENSION: stable   Atrial fibrillation, permanent  Chronic anticoagulation, on coumadin - HR controlled   DVT Prophylaxis: on heparin and coumadin  Code Status:FC  Disposition:    Subjective: SOB with wheezing, CP improved  Objective: Weight change:   Intake/Output Summary (Last 24 hours) at 01/12/13 1324 Last data filed at 01/12/13 0805  Gross per 24 hour  Intake      0 ml  Output      0 ml  Net      0 ml   Blood pressure 119/72, pulse 89, temperature 97.5 F (36.4 C), temperature source Oral, resp. rate 18, height 6' (1.829 m), weight 93 kg (205 lb 0.4 oz), SpO2 94.00%.  Physical Exam: General: Alert and awake, oriented x3, not in any acute distress. HEENT: anicteric sclera, pupils reactive to light and accommodation, EOMI CVS: S1-S2 clear, no murmur rubs or gallops Chest: diffuse wheezing bilaterally Abdomen: soft nontender, nondistended, normal bowel sounds, no organomegaly Extremities: no cyanosis, clubbing or edema noted bilaterally Neuro: Cranial nerves II-XII intact, no focal neurological deficits  Lab Results: Basic Metabolic Panel:  Lab 01/12/13 2440 01/11/13 1612  NA 143 135  K 3.4* 5.1  CL 102 95*  CO2 30 23  GLUCOSE 85 313*  BUN 12 27*  CREATININE 1.27 1.51*  CALCIUM 9.2 9.4  MG -- --  PHOS -- --    LCBC:  Lab 01/12/13 0645 01/11/13 1612  WBC 11.1* 15.2*  NEUTROABS -- --  HGB 15.4 16.5  HCT 46.2 48.3  MCV 98.5 99.8  PLT 152 169   Cardiac Enzymes:  Lab 01/12/13 1217 01/12/13 0643 01/12/13 0100  CKTOTAL -- -- --  CKMB -- -- --  CKMBINDEX -- -- --  TROPONINI <0.30 <0.30 <0.30   CBG:  Lab 01/12/13 0052  GLUCAP 127*     Micro Results: No results found for this or any previous visit (from the past 240 hour(s)).  Studies/Results: Dg Chest 2 View  01/11/2013  *RADIOLOGY REPORT*  Clinical Data: Chest pain and shortness of breath.  CHEST - 2 VIEW  Comparison: Two-view chest 03/31/2011.  Findings: The heart size is normal.  Mild prominence of pulmonary arteries is stable.  Scarring at the lung bases is similar to the prior exam.  The patient is status post median sternotomy for CABG. The visualized soft tissues and bony thorax are otherwise unremarkable.  IMPRESSION:  1.  No acute cardiopulmonary disease or significant interval change. 2.  Status post CABG. 3.  Stable scarring at the lung bases bilaterally.   Original Report Authenticated By: Marin Roberts, M.D.     Medications: Scheduled Meds:   . aspirin EC  81 mg Oral Daily  . atorvastatin  40 mg Oral Daily  . clopidogrel  75 mg Oral Daily  . diltiazem  240 mg Oral Daily  . ezetimibe  10 mg Oral Daily  . furosemide  40 mg Oral Daily  . levalbuterol  0.63 mg Nebulization Q6H   And  . ipratropium  0.5 mg Nebulization Q6H  . isosorbide mononitrate  60 mg Oral Daily  . leflunomide  20 mg Oral Daily  . levothyroxine  175 mcg Oral Daily  . losartan  12.5 mg Oral Daily  . methylPREDNISolone (SOLU-MEDROL) injection  60 mg Intravenous Q6H  . metoprolol tartrate  25 mg Oral BID  . sodium chloride  3 mL Intravenous Q12H  . warfarin  5 mg Oral Daily  . Warfarin - Physician Dosing Inpatient   Does not apply q1800      LOS: 1 day   [name] M.D. [facility] 01/12/2013, 1:24 PM Pager:  [phone]  If 7PM-7AM, please contact night-coverage [email or web address] Password [email or web address]

## 2013-01-12 NOTE — Progress Notes (Signed)
  Echocardiogram 2D Echocardiogram has been performed.  [name] A 01/12/2013, 3:19 PM

## 2013-01-12 NOTE — Consult Note (Signed)
Reason for Consult: unstable angina  Referring Physician: Dr. Onalee Hua   Cardiologist:  Dr. Kathe Mariner [name] is an 72 y.o. male.    Chief Complaint: admitted 01/11/13 at 11:42 pm   HPI: 72 yo male with 2 weeks of waxing/waning sscp with associated sob no radiation that is exertional and better with rest. Usually relieved with sl ntg at home but over the last week this has really not be as effective. Saw his cardiologist Dr [name] about 3 weeks ago and at that time was not having this cp. cabg in 1998. No le edema. No fevers. No n/v. Was placed on ntg gtt in ED and was cp free on IM exam. Chest pain increased over the last 2 days.  With rest and exertion.   Currently no further chest pain on the NTG drip.  + wheezes on exam.  Pt's prednisone had been increased to 20 mg daily, up from 5 mg.         Last cardiac cath 04/03/2011 SELECTIVE CORONARY ANGIOGRAPHY:  1. Left main normal.  2. LAD; occluded after the first diagonal branch, which itself had 99%  ostial stenosis.  3. Left circumflex; 95% stenosis in the midportion of the mid circ  with small continuation thereafter. There is retrograde fill up  the OM graft with only mild disease in the OM prior to graft  insertion.  4. Right coronary artery; dominant with a 50% proximal eccentric  stenosis. The PDA had minimal flow and was subtotally occluded.  The continuation to the PLA branch is widely patent.  5. Vein to the PDA 50% to 60% proximal/ostial stenosis, 75% insertion  stenosis with 95% stenosis in the PDA beyond graft insertion.  6. Left ventriculography; RAO left ventriculogram was performed using  25 mL of Visipaque dye at 12 mL per second. The overall LVEF was  estimated approximately 20% to 25% with moderate-to-severe global  hypokinesia, more prominent in the anterior wall.  ECHO prior to this cath EF was 45%.  He has permanent Atrial fib on coumadin and it is rate controlled.  He has seen Dr. Johney Frame for possible atrial fib.  Ablation but it was decided he was not a good candidate for ablation.    He was to have a myoview stress test in March.     Past Medical History  Diagnosis Date  . Pleuritis     h/o------ with negative vats biopsy  . Acute myocardial infarction, unspecified site, episode of care unspecified   . Hypertension   . Other and unspecified hyperlipidemia   . Coronary artery disease   . A-fib     persistant  . Chronic ischemic heart disease, unspecified   . Diabetes mellitus   . 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 Chronic anticoagulation, on coumadin 01/12/2013  . Cardiomyopathy, ischemic EF 25% 01/12/2013    Past Surgical History  Procedure Date  . Bypass graft   . Lung surgery   . Knee surgery   . Carpal tunnel release   . Cataract extraction   . Cardiac catheterization   . Coronary artery bypass graft     Family History  Problem Relation Age of Onset  . Heart disease Mother   . Heart disease Father   . Heart disease Brother   . Heart disease Maternal Aunt   . Cancer     Social History:  reports that he has never smoked. He does not have any smokeless tobacco history  on file. He reports that he does not drink alcohol. His drug history not on file. married., 3 children, 6 grandchildren.  Allergies:  Allergies  Allergen Reactions  . Clarithromycin Other (See Comments)    Unknown, thinks "blisters in mouth"  . Influenza Vaccines Other (See Comments)    Severe coughing for weeks after vaccine; also had flu like symptoms for about 7 to 8 weeks.   . Lisinopril Other (See Comments)    cough  . Ranexa (Ranolazine Er)     Medications Prior to Admission  Medication Sig Dispense Refill  . acetaminophen (TYLENOL) 650 MG CR tablet Take 650 mg by mouth every 8 (eight) hours as needed. For pain      . aspirin 81 MG tablet Take 81 mg by mouth daily.      [name] atorvastatin (LIPITOR) 40 MG tablet Take 40 mg by mouth daily.      . calcium  carbonate (OS-CAL) 600 MG TABS Take 600 mg by mouth daily.      . clopidogrel (PLAVIX) 75 MG tablet Take 75 mg by mouth daily.      [name] diltiazem (DILACOR XR) 240 MG 24 hr capsule Take 240 mg by mouth daily.      [name] ezetimibe (ZETIA) 10 MG tablet Take 10 mg by mouth daily.      . fluticasone (FLONASE) 50 MCG/ACT nasal spray Place 2 sprays into both nostrils daily.       . furosemide (LASIX) 40 MG tablet Take 40 mg by mouth daily.      . isosorbide mononitrate (IMDUR) 60 MG 24 hr tablet Take 60 mg by mouth daily.      [name] leflunomide (ARAVA) 20 MG tablet Take 20 mg by mouth daily.      [name] levothyroxine (SYNTHROID, LEVOTHROID) 175 MCG tablet Take 175 mcg by mouth daily.      [name] losartan (COZAAR) 25 MG tablet Take 12.5 mg by mouth daily.      . metoprolol tartrate (LOPRESSOR) 25 MG tablet Take 25 mg by mouth 2 (two) times daily.      . nitroGLYCERIN (NITROSTAT) 0.4 MG SL tablet Place 0.4 mg under the tongue every 5 (five) minutes as needed.      . Omega-3 Fatty Acids (FISH OIL) 1000 MG CAPS Take 3,000 mg by mouth every morning.      . Omeprazole (PRILOSEC PO) Take 1 tablet by mouth every morning.      . predniSONE (DELTASONE) 5 MG tablet Take 5 mg by mouth daily.      . Saxagliptin-Metformin (KOMBIGLYZE XR) 2.04-999 MG TB24 Take 1 tablet by mouth 2 (two) times daily.      [name] warfarin (COUMADIN) 5 MG tablet Take 5 mg by mouth daily. Takes 7.5 mg-Sun,Tues,Wed,Thurs,Sat Takes 5 mg-Mon, Wed      His prednisone was increased last week to 20 mg daily.   Results for orders placed during the hospital encounter of 01/11/13 (from the past 48 hour(s))  CBC     Status: Abnormal   Collection Time   01/11/13  4:12 PM      Component Value Range Comment   WBC 15.2 (*) 4.0 - 10.5 K/uL    RBC 4.84  4.22 - 5.81 MIL/uL    Hemoglobin 16.5  13.0 - 17.0 g/dL    HCT 45.4  09.8 - 11.9 %    MCV 99.8  78.0 - 100.0 fL    MCH 34.1 (*) 26.0 - 34.0 pg    MCHC  34.2  30.0 - 36.0 g/dL    RDW 16.1 (*) 09.6 - 15.5 %    Platelets  169  150 - 400 K/uL   BASIC METABOLIC PANEL     Status: Abnormal   Collection Time   01/11/13  4:12 PM      Component Value Range Comment   Sodium 135  135 - 145 mEq/L    Potassium 5.1  3.5 - 5.1 mEq/L    Chloride 95 (*) 96 - 112 mEq/L    CO2 23  19 - 32 mEq/L    Glucose, Bld 313 (*) 70 - 99 mg/dL    BUN 27 (*) 6 - 23 mg/dL    Creatinine, Ser 0.45 (*) 0.50 - 1.35 mg/dL    Calcium 9.4  8.4 - 40.9 mg/dL    GFR calc non Af Amer 44 (*) >90 mL/min    GFR calc Af Amer 51 (*) >90 mL/min   POCT I-STAT TROPONIN I     Status: Normal   Collection Time   01/11/13  4:47 PM      Component Value Range Comment   Troponin i, poc 0.02  0.00 - 0.08 ng/mL    Comment 3            PRO B NATRIURETIC PEPTIDE     Status: Abnormal   Collection Time   01/11/13  6:35 PM      Component Value Range Comment   Pro B Natriuretic peptide (BNP) 1774.0 (*) 0 - 125 pg/mL   PROTIME-INR     Status: Abnormal   Collection Time   01/11/13  6:35 PM      Component Value Range Comment   Prothrombin Time 21.3 (*) 11.6 - 15.2 seconds    INR 1.93 (*) 0.00 - 1.49   APTT     Status: Normal   Collection Time   01/11/13  6:35 PM      Component Value Range Comment   aPTT 34  24 - 37 seconds   URINALYSIS, ROUTINE W REFLEX MICROSCOPIC     Status: Abnormal   Collection Time   01/11/13  8:47 PM      Component Value Range Comment   Color, Urine YELLOW  YELLOW    APPearance CLEAR  CLEAR    Specific Gravity, Urine 1.019  1.005 - 1.030    pH 5.0  5.0 - 8.0    Glucose, UA 500 (*) NEGATIVE mg/dL    Hgb urine dipstick NEGATIVE  NEGATIVE    Bilirubin Urine NEGATIVE  NEGATIVE    Ketones, ur NEGATIVE  NEGATIVE mg/dL    Protein, ur NEGATIVE  NEGATIVE mg/dL    Urobilinogen, UA 0.2  0.0 - 1.0 mg/dL    Nitrite NEGATIVE  NEGATIVE    Leukocytes, UA NEGATIVE  NEGATIVE MICROSCOPIC NOT DONE ON URINES WITH NEGATIVE PROTEIN, BLOOD, LEUKOCYTES, NITRITE, OR GLUCOSE <1000 mg/dL.  GLUCOSE, CAPILLARY     Status: Abnormal   Collection Time   01/12/13  12:52 AM      Component Value Range Comment   Glucose-Capillary 127 (*) 70 - 99 mg/dL   TROPONIN I     Status: Normal   Collection Time   01/12/13  1:00 AM      Component Value Range Comment   Troponin I <0.30  <0.30 ng/mL   TROPONIN I     Status: Normal   Collection Time   01/12/13  6:43 AM      Component Value Range Comment  Troponin I <0.30  <0.30 ng/mL   CBC     Status: Abnormal   Collection Time   01/12/13  6:45 AM      Component Value Range Comment   WBC 11.1 (*) 4.0 - 10.5 K/uL    RBC 4.69  4.22 - 5.81 MIL/uL    Hemoglobin 15.4  13.0 - 17.0 g/dL    HCT 96.0  45.4 - 09.8 %    MCV 98.5  78.0 - 100.0 fL    MCH 32.8  26.0 - 34.0 pg    MCHC 33.3  30.0 - 36.0 g/dL    RDW 11.9 (*) 14.7 - 15.5 %    Platelets 152  150 - 400 K/uL   BASIC METABOLIC PANEL     Status: Abnormal   Collection Time   01/12/13  6:45 AM      Component Value Range Comment   Sodium 143  135 - 145 mEq/L    Potassium 3.4 (*) 3.5 - 5.1 mEq/L    Chloride 102  96 - 112 mEq/L    CO2 30  19 - 32 mEq/L    Glucose, Bld 85  70 - 99 mg/dL    BUN 12  6 - 23 mg/dL    Creatinine, Ser 8.29  0.50 - 1.35 mg/dL    Calcium 9.2  8.4 - 56.2 mg/dL    GFR calc non Af Amer 54 (*) >90 mL/min    GFR calc Af Amer 63 (*) >90 mL/min   PROTIME-INR     Status: Abnormal   Collection Time   01/12/13  6:45 AM      Component Value Range Comment   Prothrombin Time 20.8 (*) 11.6 - 15.2 seconds    INR 1.87 (*) 0.00 - 1.49   HEPARIN LEVEL (UNFRACTIONATED)     Status: Abnormal   Collection Time   01/12/13  6:45 AM      Component Value Range Comment   Heparin Unfractionated 0.25 (*) 0.30 - 0.70 IU/mL    Dg Chest 2 View  01/11/2013  *RADIOLOGY REPORT*  Clinical Data: Chest pain and shortness of breath.  CHEST - 2 VIEW  Comparison: Two-view chest 03/31/2011.  Findings: The heart size is normal.  Mild prominence of pulmonary arteries is stable.  Scarring at the lung bases is similar to the prior exam.  The patient is status post median  sternotomy for CABG. The visualized soft tissues and bony thorax are otherwise unremarkable.  IMPRESSION:  1.  No acute cardiopulmonary disease or significant interval change. 2.  Status post CABG. 3.  Stable scarring at the lung bases bilaterally.   Original Report Authenticated By: Marin Roberts, M.D.     ROS: General:+cold several weeks ago. Unsure if he had fever, no weight changes Skin:no rashes or ulcers HEENT:no blurred vision, no congestion CV:see HPI PUL:see HPI GI:no diarrhea constipation or melena, no indigestion GU:no hematuria, no dysuria, occ trouble swallowing, since lung surgery he has urgency with stools, though not diarrhea MS:no joint pain, no claudication Neuro:no syncope, no lightheadedness Endo:+ diabetes, no thyroid disease   Blood pressure 119/72, pulse 89, temperature 97.5 F (36.4 C), temperature source Oral, resp. rate 18, height 6' (1.829 m), weight 93 kg (205 lb 0.4 oz), SpO2 96.00%. PE: General:alert and oriented, MAE, follows commands Skin:warm and clammy, brisk capillary refill  HEENT:normocephalic sclera clear Neck:no JVD, no bruits Heart:irreg irreg Lungs:+ rhonchi in the bases, + exp wheeze Abd:+ BS soft, non tender Ext:no edema Neuro:alert and oriented  X 3 MAE, follows commands    Assessment/Plan Principal Problem:  *Unstable angina Active Problems:  HYPERTENSION  CORONARY ARTERY DISEASE  ISCHEMIC HEART DISEASE  Atrial fibrillation, permanent  Chronic anticoagulation, on coumadin  Cardiomyopathy, ischemic EF 25%  Bronchitis, acute  PLAN: negative troponins, pro bnp elevated at 1774 elevated though comparing to BNP.   Lungs stable.  Dr. Shelle Iron, pt's pulmonologist reviewed xray and does not believe there enough scaring to cause SOB.  He recommended cardiac eval and conditioning.  INR today is 1.87 .  Admitting INR was 1.93. Recheck Echo.  ?cath vs. nuc tomorrow.   Agree with Heparin for now and hold coumadin.   He is now on IV  steroids and nebs.   INGOLD,LAURA R 01/12/2013, 10:11 AM     I have seen and examined the patient along with Nada Boozer, NP.  I have reviewed the chart, notes and new data.  I agree with NP's note.  Key new complaints: No further angina since IV NTG started Key examination changes: severe bilateral wheezing Key new findings / data: INR 1.8, creat 1.27, normal enzymes, no change in ECG  PLAN: Symptoms are strongly suggestive of unstable angina - worsened by rest and relieved temporarily with SL NTG, despite no new ECG repolarization changes and negative enzymes. Known to have moderate to severe native and graft coronary disease.  Numerous comorbid conditions include autoimmune lung disease with recent increase in steroid dose (was this really ischemic worsening of chronic heart failure?), chronic anticoagulation for AF, HTN and recent unexplained 30 lb weight loss. Will check TSH.  Coronary angio is safest approach, but would allow PT/INR to drop a little further to reduce bleeding risk. Schedule for tomorrow.  Thurmon Fair, MD, [facility] [phone] 01/12/2013, 11:28 AM

## 2013-01-12 NOTE — Progress Notes (Signed)
INITIAL NUTRITION ASSESSMENT  DOCUMENTATION CODES Per approved criteria  -Not Applicable   INTERVENTION: 1. RD will continue to monitor and add supplements if appetite does not improve   NUTRITION DIAGNOSIS: Adequate oral intake related to early satiety as evidenced by weight hx .   Goal: PO intake to meet >/=90% estimated nutrition needs  Monitor:  PO intake, weight trends, I/O's  Reason for Assessment: Malnutrition Screening Tool  72 y.o. male  Admitting Dx: Unstable angina  ASSESSMENT: Pt admitted with chest pain.  Pt and wife report a recent 30 lb weight loss. Pt states he has not ben trying to lose weight, has early satiety and decreased po intake.  Usual body weight reported at 230-235 lbs, now 205 lbs. Pt with 12.7% body weight loss in the past 3 months, significant weight loss.  Pt observed eating lunch tray, about 50% completed at time of visit and still working on it.  RD encouraged po intake of high protein foods. RD will continue to follow po intake and add interventions as needed. Now on steroid injection, expect this will help with appetite.   Height: Ht Readings from Last 1 Encounters:  01/12/13 6' (1.829 m)    Weight: Wt Readings from Last 1 Encounters:  01/12/13 205 lb 0.4 oz (93 kg)    Ideal Body Weight: 178 lbs   % Ideal Body Weight: 115%  Wt Readings from Last 10 Encounters:  01/12/13 205 lb 0.4 oz (93 kg)  12/20/12 215 lb 6.4 oz (97.705 kg)  12/03/10 232 lb (105.235 kg)  04/11/10 232 lb 6.1 oz (105.407 kg)  02/20/10 232 lb 8 oz (105.461 kg)    Usual Body Weight: 235 lbs per report  % Usual Body Weight: 87%  BMI:  Body mass index is 27.81 kg/(m^2). Overweight   Estimated Nutritional Needs: Kcal: 2000-2200 Protein: 90-100 gm  Fluid: 2-2.2 L/day  Skin: intact   Diet Order: Carb Control  EDUCATION NEEDS: -No education needs identified at this time   Intake/Output Summary (Last 24 hours) at 01/12/13 1236 Last data filed at  01/12/13 0805  Gross per 24 hour  Intake      0 ml  Output      0 ml  Net      0 ml    Last BM: PTA    Labs:   Lab 01/12/13 0645 01/11/13 1612  NA 143 135  K 3.4* 5.1  CL 102 95*  CO2 30 23  BUN 12 27*  CREATININE 1.27 1.51*  CALCIUM 9.2 9.4  MG -- --  PHOS -- --  GLUCOSE 85 313*    CBG (last 3)   Basename 01/12/13 0052  GLUCAP 127*    Scheduled Meds:   . aspirin EC  81 mg Oral Daily  . atorvastatin  40 mg Oral Daily  . clopidogrel  75 mg Oral Daily  . diltiazem  240 mg Oral Daily  . ezetimibe  10 mg Oral Daily  . furosemide  40 mg Oral Daily  . levalbuterol  0.63 mg Nebulization Q6H   And  . ipratropium  0.5 mg Nebulization Q6H  . isosorbide mononitrate  60 mg Oral Daily  . leflunomide  20 mg Oral Daily  . levothyroxine  175 mcg Oral Daily  . losartan  12.5 mg Oral Daily  . methylPREDNISolone (SOLU-MEDROL) injection  60 mg Intravenous Q6H  . metoprolol tartrate  25 mg Oral BID  . sodium chloride  3 mL Intravenous Q12H  . warfarin  5 mg Oral Daily  . Warfarin - Physician Dosing Inpatient   Does not apply q1800    Continuous Infusions:   . heparin 1,500 Units/hr (01/12/13 0821)  . nitroGLYCERIN      Past Medical History  Diagnosis Date  . Pleuritis     h/o------ with negative vats biopsy  . Acute myocardial infarction, unspecified site, episode of care unspecified   . Hypertension   . Other and unspecified hyperlipidemia   . Coronary artery disease   . A-fib     persistant  . Chronic ischemic heart disease, unspecified   . Diabetes mellitus   . 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 Chronic anticoagulation, on coumadin 01/12/2013  . Cardiomyopathy, ischemic EF 25% 01/12/2013    Past Surgical History  Procedure Date  . Bypass graft   . Lung surgery   . Knee surgery   . Carpal tunnel release   . Cataract extraction   . Cardiac catheterization   . Coronary artery bypass graft     Clarene Duke  RD, LDN Pager [phone] After Hours pager [phone]

## 2013-01-12 NOTE — ED Provider Notes (Signed)
I saw and evaluated the patient, reviewed the resident's note and I agree with the findings and plan.   Shelda Jakes, MD 01/12/13 [phone]

## 2013-01-12 NOTE — Progress Notes (Signed)
ANTICOAGULATION CONSULT NOTE - Follow Up Consult  Pharmacy Consult for Heparin Indication: Afib + ACS  Allergies  Allergen Reactions  . Clarithromycin Other (See Comments)    Unknown, thinks "blisters in mouth"  . Influenza Vaccines Other (See Comments)    Severe coughing for weeks after vaccine; also had flu like symptoms for about 7 to 8 weeks.   . Lisinopril Other (See Comments)    cough  . Ranexa (Ranolazine Er)     Patient Measurements: Height: 6' (182.9 cm) Weight: 205 lb 0.4 oz (93 kg) (scale c) IBW/kg (Calculated) : 77.6  Heparin Dosing Weight:    Vital Signs: Temp: 97 F (36.1 C) (01/30 1454) Temp src: Oral (01/30 1454) BP: 101/69 mmHg (01/30 1454) Pulse Rate: 105  (01/30 1454)  Labs:  Basename 01/12/13 1508 01/12/13 1217 01/12/13 0645 01/12/13 0643 01/12/13 0100 01/11/13 1835 01/11/13 1612  HGB -- -- 15.4 -- -- -- 16.5  HCT -- -- 46.2 -- -- -- 48.3  PLT -- -- 152 -- -- -- 169  APTT -- -- -- -- -- 34 --  LABPROT -- -- 20.8* -- -- 21.3* --  INR -- -- 1.87* -- -- 1.93* --  HEPARINUNFRC 0.33 -- 0.25* -- -- -- --  CREATININE -- -- 1.27 -- -- -- 1.51*  CKTOTAL -- -- -- -- -- -- --  CKMB -- -- -- -- -- -- --  TROPONINI -- <0.30 -- <0.30 <0.30 -- --    Estimated Creatinine Clearance: 56.9 ml/min (by C-G formula based on Cr of 1.27).  Assessment: Chest pain/SOB x 2 weeks  Anticoagulation: Coumadin PTA for afib. Admit INR 1.93 on 5mg  MW and 7.5mg  other days. Heparin to start for ACS. Troponins negative x 2. INR down to 1.87 this am. Heparin level low this am at 0.25 now in goal 0.33.   Goal of Therapy:  Heparin level 0.3-0.7 units/ml Monitor platelets by anticoagulation protocol: Yes   Plan:  Continue heparin at 1500 units/hr.   [name], [name] 01/12/2013,3:48 PM

## 2013-01-12 NOTE — Telephone Encounter (Signed)
[id number] x1 

## 2013-01-13 ENCOUNTER — Ambulatory Visit (HOSPITAL_COMMUNITY): Admit: 2013-01-13 | Payer: Self-pay | Admitting: Cardiology

## 2013-01-13 ENCOUNTER — Encounter (HOSPITAL_COMMUNITY)
Admission: EM | Disposition: A | Discharge status: Home / Self Care | Payer: Self-pay | Source: Home / Self Care | Attending: Internal Medicine

## 2013-01-13 HISTORY — PX: LEFT HEART CATHETERIZATION WITH CORONARY/GRAFT ANGIOGRAM: SHX5450

## 2013-01-13 LAB — CBC
Platelets: 141 10*3/uL — ABNORMAL LOW (ref 150–400)
RDW: 15.3 % (ref 11.5–15.5)
WBC: 13 10*3/uL — ABNORMAL HIGH (ref 4.0–10.5)

## 2013-01-13 LAB — BASIC METABOLIC PANEL
Calcium: 8.9 mg/dL (ref 8.4–10.5)
Creatinine, Ser: 1.03 mg/dL (ref 0.50–1.35)
GFR calc Af Amer: 81 mL/min — ABNORMAL LOW (ref >90)
Sodium: 134 mEq/L — ABNORMAL LOW (ref 135–145)

## 2013-01-13 LAB — HEPARIN LEVEL (UNFRACTIONATED): Heparin Unfractionated: 0.49 IU/mL (ref 0.30–0.70)

## 2013-01-13 LAB — PLATELET INHIBITION P2Y12: Platelet Function  P2Y12: 207 [PRU] (ref 194–418)

## 2013-01-13 LAB — GLUCOSE, CAPILLARY

## 2013-01-13 LAB — PROTIME-INR: INR: 1.65 — ABNORMAL HIGH (ref 0.00–1.49)

## 2013-01-13 SURGERY — LEFT HEART CATHETERIZATION WITH CORONARY/GRAFT ANGIOGRAM
Anesthesia: LOCAL

## 2013-01-13 MED ORDER — NITROGLYCERIN 1 MG/10 ML FOR IR/CATH LAB
INTRA_ARTERIAL | Status: AC
  Filled 2013-01-13: qty 10

## 2013-01-13 MED ORDER — MIDAZOLAM HCL 2 MG/2ML IJ SOLN
INTRAMUSCULAR | Status: AC
  Filled 2013-01-13: qty 2

## 2013-01-13 MED ORDER — SODIUM CHLORIDE 0.9 % IV SOLN: 1.0000 mL/kg/h | INTRAVENOUS | Status: AC

## 2013-01-13 MED ORDER — LIDOCAINE HCL (PF) 1 % IJ SOLN
INTRAMUSCULAR | Status: AC
  Filled 2013-01-13: qty 30

## 2013-01-13 MED ORDER — HEPARIN (PORCINE) IN NACL 2-0.9 UNIT/ML-% IJ SOLN
INTRAMUSCULAR | Status: AC
  Filled 2013-01-13: qty 1000

## 2013-01-13 MED ORDER — WARFARIN SODIUM 5 MG PO TABS
5.0000 mg | ORAL_TABLET | Freq: Once | ORAL | Status: AC
  Administered 2013-01-13: 5 mg via ORAL
  Filled 2013-01-13 (×2): qty 1

## 2013-01-13 MED ORDER — FENTANYL CITRATE 0.05 MG/ML IJ SOLN
INTRAMUSCULAR | Status: AC
  Filled 2013-01-13: qty 2

## 2013-01-13 MED ORDER — WARFARIN - PHARMACIST DOSING INPATIENT: Freq: Every day | Status: DC

## 2013-01-13 MED ORDER — SODIUM CHLORIDE 0.9 % IJ SOLN
3.0000 mL | Freq: Two times a day (BID) | INTRAMUSCULAR | Status: DC
  Administered 2013-01-14 (×4): 3 mL via INTRAVENOUS

## 2013-01-13 MED ORDER — SODIUM CHLORIDE 0.9 % IJ SOLN: 3.0000 mL | INTRAMUSCULAR | Status: DC | PRN

## 2013-01-13 MED ORDER — SODIUM CHLORIDE 0.9 % IV SOLN: 250.0000 mL | INTRAVENOUS | Status: DC | PRN

## 2013-01-13 MED ORDER — METHYLPREDNISOLONE SODIUM SUCC 40 MG IJ SOLR
40.0000 mg | Freq: Three times a day (TID) | INTRAMUSCULAR | Status: DC
  Administered 2013-01-13 – 2013-01-14 (×2): 40 mg via INTRAVENOUS
  Filled 2013-01-13 (×5): qty 1

## 2013-01-13 NOTE — Progress Notes (Signed)
Patient ID: [name]  male  ZOX:[id number]    DOB: 1941-05-15    DOA: 01/11/2013  PCP: Thayer Headings, MD  Assessment/Plan: Principal Problem:  *Unstable angina: History of  Cardiomyopathy, ischemic EF 25%, CAD s/p CABG, CP better at the time of my exam  - on NTG gtt and heparin gtt,  On ASA, BB, zetia, plavix, CCB, imdur - cath today   Bronchitis, acute: improving -  on scheduled nebulizer treatments with Xopenex and Atrovent, Solu-Medrol IV, will taper - He is on maintenance dose of prednisone 5 mg daily, will place on prednisone oral tomorrow with taper, he will follow with Dr. Marcelyn Bruins (pulmonology) after DC  Active Problems:  HYPERTENSION: stable   Atrial fibrillation, permanent  Chronic anticoagulation, on coumadin - HR controlled   DVT Prophylaxis: on heparin and coumadin  Code Status:FC  Disposition:    Subjective: Chest pain resolved, and wheezing and shortness of breath improving  Objective: Weight change: -5.147 kg (-11 lb 5.5 oz)  Intake/Output Summary (Last 24 hours) at 01/13/13 1211 Last data filed at 01/13/13 1059  Gross per 24 hour  Intake  531.4 ml  Output   2400 ml  Net -1868.6 ml   Blood pressure 122/71, pulse 63, temperature 97.3 F (36.3 C), temperature source Oral, resp. rate 18, height 6' (1.829 m), weight 91.853 kg (202 lb 8 oz), SpO2 95.00%.  Physical Exam: General: Alert and awake, oriented x3, not in any acute distress. CVS: S1-S2 clear, no murmur rubs or gallops Chest: Scattered wheezing bilaterally, improving from yesterday  Abdomen: soft nontender, nondistended, normal bowel sounds Extremities: no cyanosis, clubbing or edema noted bilaterally   Lab Results: Basic Metabolic Panel:  Lab 01/13/13 8119 01/12/13 0645  NA 134* 143  K 4.5 3.4*  CL 98 102  CO2 25 30  GLUCOSE 270* 85  BUN 28* 12  CREATININE 1.03 1.27  CALCIUM 8.9 9.2  MG -- --  PHOS -- --   LCBC:  Lab 01/13/13 0600 01/12/13 0645  WBC 13.0* 11.1*   NEUTROABS -- --  HGB 14.9 15.4  HCT 44.8 46.2  MCV 97.6 98.5  PLT 141* 152   Cardiac Enzymes:  Lab 01/12/13 1217 01/12/13 0643 01/12/13 0100  CKTOTAL -- -- --  CKMB -- -- --  CKMBINDEX -- -- --  TROPONINI <0.30 <0.30 <0.30   CBG:  Lab 01/12/13 2251 01/12/13 0052  GLUCAP 332* 127*     Micro Results: No results found for this or any previous visit (from the past 240 hour(s)).  Studies/Results: Dg Chest 2 View  01/11/2013  *RADIOLOGY REPORT*  Clinical Data: Chest pain and shortness of breath.  CHEST - 2 VIEW  Comparison: Two-view chest 03/31/2011.  Findings: The heart size is normal.  Mild prominence of pulmonary arteries is stable.  Scarring at the lung bases is similar to the prior exam.  The patient is status post median sternotomy for CABG. The visualized soft tissues and bony thorax are otherwise unremarkable.  IMPRESSION:  1.  No acute cardiopulmonary disease or significant interval change. 2.  Status post CABG. 3.  Stable scarring at the lung bases bilaterally.   Original Report Authenticated By: Marin Roberts, M.D.     Medications: Scheduled Meds:    . aspirin EC  81 mg Oral Daily  . atorvastatin  40 mg Oral Daily  . clopidogrel  75 mg Oral Daily  . diazepam  2 mg Oral On Call  . diltiazem  240 mg Oral Daily  .  ezetimibe  10 mg Oral Daily  . furosemide  40 mg Oral Daily  . ipratropium  0.5 mg Nebulization TID   And  . levalbuterol  0.63 mg Nebulization TID  . isosorbide mononitrate  60 mg Oral Daily  . leflunomide  20 mg Oral Daily  . levothyroxine  175 mcg Oral Daily  . losartan  12.5 mg Oral Daily  . methylPREDNISolone (SOLU-MEDROL) injection  60 mg Intravenous Q6H  . metoprolol tartrate  25 mg Oral BID  . sodium chloride  3 mL Intravenous Q12H  . sodium chloride  3 mL Intravenous Q12H  . warfarin  5 mg Oral Daily  . Warfarin - Physician Dosing Inpatient   Does not apply q1800      LOS: 2 days   [name] M.D. [facility] 01/13/2013, 12:11 PM Pager: [phone]  If 7PM-7AM, please contact night-coverage [email or web address] Password [email or web address]

## 2013-01-13 NOTE — Progress Notes (Signed)
Patient has returned to unit from Cath Lab, patient is stable and VS are stable; will continue to monitor patient. Lorretta Harp RN

## 2013-01-13 NOTE — Progress Notes (Signed)
ANTICOAGULATION CONSULT NOTE - Follow Up Consult  Pharmacy Consult for Heparin + ACS Indication: atrial fibrillation  Allergies  Allergen Reactions  . Clarithromycin Other (See Comments)    Unknown, thinks "blisters in mouth"  . Influenza Vaccines Other (See Comments)    Severe coughing for weeks after vaccine; also had flu like symptoms for about 7 to 8 weeks.   . Lisinopril Other (See Comments)    cough  . Ranexa (Ranolazine Er)     Patient Measurements: Height: 6' (182.9 cm) Weight: 202 lb 8 oz (91.853 kg) (c scale) IBW/kg (Calculated) : 77.6  Heparin Dosing Weight:    Vital Signs: Temp: 97.3 F (36.3 C) (01/31 1053) Temp src: Oral (01/31 1053) BP: 122/71 mmHg (01/31 1053) Pulse Rate: 63  (01/31 1053)  Labs:  Basename 01/13/13 0600 01/12/13 1508 01/12/13 1217 01/12/13 0645 01/12/13 0643 01/12/13 0100 01/11/13 1835 01/11/13 1612  HGB 14.9 -- -- 15.4 -- -- -- --  HCT 44.8 -- -- 46.2 -- -- -- 48.3  PLT 141* -- -- 152 -- -- -- 169  APTT -- -- -- -- -- -- 34 --  LABPROT 18.6* -- -- 20.8* -- -- 21.3* --  INR 1.61* -- -- 1.87* -- -- 1.93* --  HEPARINUNFRC 0.49 0.33 -- 0.25* -- -- -- --  CREATININE 1.03 -- -- 1.27 -- -- -- 1.51*  CKTOTAL -- -- -- -- -- -- -- --  CKMB -- -- -- -- -- -- -- --  TROPONINI -- -- <0.30 -- <0.30 <0.30 -- --    Estimated Creatinine Clearance: 70.1 ml/min (by C-G formula based on Cr of 1.03).  Assessment: Chest pain/SOB x 2 weeks  Anticoagulation: Coumadin PTA for afib. Admit INR 1.93 on 5mg  MW and 7.5mg  other days. Heparin to start for ACS. Troponins negative x 2. INR down to 1.61 this am. Heparin level 0.49 in goal. Plan cath later this pm.  Infectious Disease: Afebrile. WBC back up to 13. but still elevated.  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  Hematology / Oncology: CBC WNL  PTA Medication Issues: PPI , Oscal, Flonase, Lovaza, Saxagliptin/Metformin not resumed  Best Practices :   Goal of Therapy:  INR 2-3 Heparin level 0.3-0.7 units/ml Monitor platelets by anticoagulation protocol: Yes   Plan:  Continue heparin at 1500 units/hr Will f/u after cath for resumption of home Coumadin  Misty Stanley Stillinger 01/13/2013,11:20 AM

## 2013-01-13 NOTE — Progress Notes (Signed)
ANTICOAGULATION CONSULT NOTE - Follow Up Consult  Pharmacy Consult for Warfarin Indication: atrial fibrillation  Allergies  Allergen Reactions  . Clarithromycin Other (See Comments)    Unknown, thinks "blisters in mouth"  . Influenza Vaccines Other (See Comments)    Severe coughing for weeks after vaccine; also had flu like symptoms for about 7 to 8 weeks.   . Lisinopril Other (See Comments)    cough  . Ranexa (Ranolazine Er)    Patient Measurements: Height: 6' (182.9 cm) Weight: 202 lb 8 oz (91.853 kg) (c scale) IBW/kg (Calculated) : 77.6   Vital Signs: Temp: 97.4 F (36.3 C) (01/31 1444) Temp src: Oral (01/31 1444) BP: 118/75 mmHg (01/31 1444) Pulse Rate: 97  (01/31 1546)  Labs:  Basename 01/13/13 0600 01/12/13 1508 01/12/13 1217 01/12/13 0645 01/12/13 0643 01/12/13 0100 01/11/13 1835 01/11/13 1612  HGB 14.9 -- -- 15.4 -- -- -- --  HCT 44.8 -- -- 46.2 -- -- -- 48.3  PLT 141* -- -- 152 -- -- -- 169  APTT -- -- -- -- -- -- 34 --  LABPROT 18.6* -- -- 20.8* -- -- 21.3* --  INR 1.61* -- -- 1.87* -- -- 1.93* --  HEPARINUNFRC 0.49 0.33 -- 0.25* -- -- -- --  CREATININE 1.03 -- -- 1.27 -- -- -- 1.51*  CKTOTAL -- -- -- -- -- -- -- --  CKMB -- -- -- -- -- -- -- --  TROPONINI -- -- <0.30 -- <0.30 <0.30 -- --   Estimated Creatinine Clearance: 70.1 ml/min (by C-G formula based on Cr of 1.03).  Assessment: Chest pain/SOB x 2 weeks  Anticoagulation: Coumadin PTA for afib. Admit INR 1.93 on 5mg  MW and 7.5mg  other days.  Pt. Is now s/p cath and Heparin has been discontinued and plan is for Warfarin to be resumed.    Goal of Therapy:  INR 2.0-3.0 Monitor platelets by anticoagulation protocol: Yes   Plan:  - Will restart Warfarin with 5mg  x 1 this evening.  - F/U AM PT/INR and adjust.  Nadara Mustard, PharmD., MS Clinical Pharmacist Pager:  [phone] Thank you for allowing pharmacy to be part of this patients care team. 01/13/2013,5:13 PM

## 2013-01-13 NOTE — Progress Notes (Signed)
Subjective:  No angina overnight. He has had some Rt flank pain that sounds M/S. No hematuria or dysuria.  Objective:  Vital Signs in the last 24 hours: Temp:  [97 F (36.1 C)-97.3 F (36.3 C)] 97 F (36.1 C) (01/31 0558) Pulse Rate:  [67-105] 67  (01/31 0558) Resp:  [18-20] 20  (01/31 0558) BP: (101-124)/(63-84) 120/84 mmHg (01/31 0558) SpO2:  [91 %-96 %] 94 % (01/31 0815) Weight:  [91.853 kg (202 lb 8 oz)] 91.853 kg (202 lb 8 oz) (01/31 0558)  Intake/Output from previous day:  Intake/Output Summary (Last 24 hours) at 01/13/13 0942 Last data filed at 01/13/13 0717  Gross per 24 hour  Intake  408.4 ml  Output   2400 ml  Net -1991.6 ml    Physical Exam: General appearance: alert, cooperative and no distress Lungs: cracles, rhonchi Rt base Heart: irregular rate and rhythm   Rate: 68  Rhythm: normal sinus rhythm and short runs, 2-5bts- NSWCT  Lab Results:  Basename 01/13/13 0600 01/12/13 0645  WBC 13.0* 11.1*  HGB 14.9 15.4  PLT 141* 152    Basename 01/13/13 0600 01/12/13 0645  NA 134* 143  K 4.5 3.4*  CL 98 102  CO2 25 30  GLUCOSE 270* 85  BUN 28* 12  CREATININE 1.03 1.27    Basename 01/12/13 1217 01/12/13 0643  TROPONINI <0.30 <0.30   Hepatic Function Panel No results found for this basename: PROT,ALBUMIN,AST,ALT,ALKPHOS,BILITOT,BILIDIR,IBILI in the last 72 hours No results found for this basename: CHOL in the last 72 hours  Basename 01/13/13 0600  INR 1.61*    Imaging: Imaging results have been reviewed  Cardiac Studies:  Assessment/Plan:   Principal Problem:  *Unstable angina  Active Problems:  CAD, CABG '98.  Atrial fibrillation, permanent  Chronic anticoagulation, on coumadin  Cardiomyopathy, ischemic EF 25%  Bronchitis, acute  DYSLIPIDEMIA  HYPERTENSION  Plan- Should be OK for cath this pm. Steroids and nebs per primary service bronchitis.    Corine Shelter PA-C 01/13/2013, 9:42 AM    I have seen and examined the patient along  with Corine Shelter PA-C.  I have reviewed the chart, notes and new data.  I agree with PA's note.  Key new complaints: no further angina Key examination changes: still has rhonchi and wheezes, but substantially better from yesterday Key new findings / data: negative enzymes and ECG  PLAN: Cardiac cath and possible PCI today This procedure has been fully reviewed with the patient and written informed consent has been obtained.   Thurmon Fair, MD, [facility] [phone] 01/13/2013, 10:01 AM

## 2013-01-13 NOTE — Progress Notes (Signed)
Inpatient Diabetes Program Recommendations  AACE/ADA: New Consensus Statement on Inpatient Glycemic Control (2013)  Target Ranges:  Prepandial:   less than 140 mg/dL      Peak postprandial:   less than 180 mg/dL (1-2 hours)      Critically ill patients:  140 - 180 mg/dL   Results for DARYL, BEEHLER (MRN [id number]) as of 01/13/2013 12:14  Ref. Range 01/12/2013 00:52 01/12/2013 22:51  Glucose-Capillary Latest Range: 70-99 mg/dL 409 (H) 811 (H)  Results for KARSTEN, HOWRY (MRN [id number]) as of 01/13/2013 12:14  Ref. Range 01/11/2013 16:12 01/12/2013 06:45 01/13/2013 06:00  Glucose Latest Range: 70-99 mg/dL 213 (H) 85 086 (H)    Inpatient Diabetes Program Recommendations Correction (SSI): Please consider checking CBGs ACHS with Novolog correction scale.  HgbA1C: Please consider ordering an A1C.  Last A1C in the chart was 7.6% on 05/28/2010.  Note: Patient has a history of diabetes and takes Saxagliptin-Metformin 2.5mg -1000mg  BID at home for diabetes management.  Currently patient is not ordered any inpatient medications for glycemic control.  Patient is ordered Solumedrol 40mg  Q8H which is impacting glycemic control.  Lab glucose this morning was 270 mg/dl.  Please consider checking CBGs ACHS with Novolog correction scale if needed and ordering an A1C to determine glycemic control over the last 2-3 months.  Will continue to follow.  Thanks, Orlando Penner, RN, BSN, CCRN Diabetes Coordinator Inpatient Diabetes Program [phone]

## 2013-01-14 DIAGNOSIS — E119 Type 2 diabetes mellitus without complications: Secondary | ICD-10-CM | POA: Diagnosis present

## 2013-01-14 DIAGNOSIS — Z7901 Long term (current) use of anticoagulants: Secondary | ICD-10-CM

## 2013-01-14 DIAGNOSIS — I2510 Atherosclerotic heart disease of native coronary artery without angina pectoris: Secondary | ICD-10-CM

## 2013-01-14 LAB — BASIC METABOLIC PANEL
CO2: 23 mEq/L (ref 19–32)
Calcium: 8.5 mg/dL (ref 8.4–10.5)
Glucose, Bld: 247 mg/dL — ABNORMAL HIGH (ref 70–99)
Potassium: 4.3 mEq/L (ref 3.5–5.1)
Sodium: 136 mEq/L (ref 135–145)

## 2013-01-14 LAB — CBC
Hemoglobin: 14.4 g/dL (ref 13.0–17.0)
MCV: 98.4 fL (ref 78.0–100.0)
Platelets: 167 10*3/uL (ref 150–400)
RBC: 4.3 MIL/uL (ref 4.22–5.81)
WBC: 17.2 10*3/uL — ABNORMAL HIGH (ref 4.0–10.5)

## 2013-01-14 LAB — GLUCOSE, CAPILLARY
Glucose-Capillary: 249 mg/dL — ABNORMAL HIGH (ref 70–99)
Glucose-Capillary: 234 mg/dL — ABNORMAL HIGH (ref 70–99)
Glucose-Capillary: 305 mg/dL — ABNORMAL HIGH (ref 70–99)
Glucose-Capillary: 342 mg/dL — ABNORMAL HIGH (ref 70–99)
Glucose-Capillary: 318 mg/dL — ABNORMAL HIGH (ref 70–99)

## 2013-01-14 LAB — HEMOGLOBIN A1C
Hgb A1c MFr Bld: 7.2 % — ABNORMAL HIGH (ref <5.7)
Mean Plasma Glucose: 160 mg/dL — ABNORMAL HIGH (ref <117)

## 2013-01-14 LAB — TSH: TSH: 0.022 u[IU]/mL — ABNORMAL LOW (ref 0.350–4.500)

## 2013-01-14 MED ORDER — INSULIN ASPART 100 UNIT/ML ~~LOC~~ SOLN
0.0000 [IU] | Freq: Three times a day (TID) | SUBCUTANEOUS | Status: DC
  Administered 2013-01-14 (×2): 11 [IU] via SUBCUTANEOUS
  Administered 2013-01-15: 3 [IU] via SUBCUTANEOUS
  Administered 2013-01-15: 8 [IU] via SUBCUTANEOUS

## 2013-01-14 MED ORDER — ZOLPIDEM TARTRATE 5 MG PO TABS: 5.0000 mg | ORAL_TABLET | Freq: Every evening | ORAL | Status: DC | PRN

## 2013-01-14 MED ORDER — ALPRAZOLAM 0.25 MG PO TABS: 0.2500 mg | ORAL_TABLET | Freq: Three times a day (TID) | ORAL | Status: DC | PRN

## 2013-01-14 MED ORDER — TRAMADOL HCL 50 MG PO TABS
50.0000 mg | ORAL_TABLET | Freq: Four times a day (QID) | ORAL | Status: DC | PRN
  Filled 2013-01-14: qty 1

## 2013-01-14 MED ORDER — INSULIN ASPART 100 UNIT/ML ~~LOC~~ SOLN
0.0000 [IU] | Freq: Every day | SUBCUTANEOUS | Status: DC
  Administered 2013-01-14: 4 [IU] via SUBCUTANEOUS

## 2013-01-14 MED ORDER — WARFARIN SODIUM 7.5 MG PO TABS
7.5000 mg | ORAL_TABLET | Freq: Once | ORAL | Status: AC
  Administered 2013-01-14: 7.5 mg via ORAL
  Filled 2013-01-14: qty 1

## 2013-01-14 MED ORDER — PREDNISONE 20 MG PO TABS
40.0000 mg | ORAL_TABLET | Freq: Every day | ORAL | Status: DC
  Administered 2013-01-14 – 2013-01-15 (×2): 40 mg via ORAL
  Filled 2013-01-14 (×4): qty 2

## 2013-01-14 MED ORDER — ISOSORBIDE MONONITRATE ER 60 MG PO TB24
60.0000 mg | ORAL_TABLET | Freq: Every day | ORAL | Status: DC
  Administered 2013-01-14: 60 mg via ORAL
  Filled 2013-01-14 (×2): qty 1

## 2013-01-14 MED ORDER — MORPHINE SULFATE 2 MG/ML IJ SOLN: 2.0000 mg | INTRAMUSCULAR | Status: DC | PRN

## 2013-01-14 MED ORDER — ISOSORBIDE MONONITRATE ER 60 MG PO TB24
120.0000 mg | ORAL_TABLET | Freq: Every day | ORAL | Status: DC
  Administered 2013-01-14 – 2013-01-15 (×2): 120 mg via ORAL
  Filled 2013-01-14 (×2): qty 2

## 2013-01-14 MED ORDER — DILTIAZEM HCL ER 180 MG PO CP24
360.0000 mg | ORAL_CAPSULE | Freq: Every day | ORAL | Status: DC
  Administered 2013-01-14 – 2013-01-15 (×2): 360 mg via ORAL
  Filled 2013-01-14 (×2): qty 2

## 2013-01-14 NOTE — Progress Notes (Signed)
Patient ID: [name]  male  ZOX:[id number]    DOB: Jan 17, 1941    DOA: 01/11/2013  PCP: Thayer Headings, MD  Assessment/Plan: Principal Problem:  *Unstable angina: History of  Cardiomyopathy, ischemic EF 25%, CAD s/p CABG, CP better at the time of my exam  - off NTG gtt and heparin gtt,  On ASA, BB, zetia, plavix, CCB, imdur - cath done yesterday focal 95% stenosis in the midsegment of PDA, severe native coronary artery disease. Discussed with Dr Royann Shivers, further management by cardiology   Bronchitis, acute: improved  -  on scheduled nebulizer treatments with Xopenex and Atrovent, transitioned to oral prednisone. DC when ready on prednisone taper 40mg x3 days, 30mg  x3days, 20mg  x 3 days, 10mg  x 3days then continue maintenance dose of 5 mg - He is on maintenance dose of prednisone 5 mg daily out-pt, follow up with Dr. Shelle Iron  Active Problems:  HYPERTENSION: stable   Atrial fibrillation, permanent  Chronic anticoagulation, on coumadin - HR controlled, on Coumadin   DVT Prophylaxis: coumadin  Code Status:FC  Disposition: Discussed with the [facility] cardiology,Dr Croitoru, will assume care for further management. I will sign-off.   Subjective: Chest pain resolved, wheezing and shortness of breath improved  Objective: Weight change: 1.134 kg (2 lb 8 oz)  Intake/Output Summary (Last 24 hours) at 01/14/13 0951 Last data filed at 01/14/13 0700  Gross per 24 hour  Intake 1240.87 ml  Output   2350 ml  Net -1109.13 ml   Blood pressure 116/84, pulse 75, temperature 97.1 F (36.2 C), temperature source Oral, resp. rate 18, height 6' (1.829 m), weight 92.987 kg (205 lb), SpO2 93.00%.  Physical Exam: General: Alert and awake, oriented x3, NAD. CVS: ireg, S1S2 Chest: CTA bilaterally  Abdomen: soft nontender, nondistended, normal bowel sounds Extremities: no cyanosis, clubbing or edema noted bilaterally   Lab Results: Basic Metabolic Panel:  Lab 01/14/13 8119 01/13/13  0600  NA 136 134*  K 4.3 4.5  CL 99 98  CO2 23 25  GLUCOSE 247* 270*  BUN 32* 28*  CREATININE 1.13 1.03  CALCIUM 8.5 8.9  MG -- --  PHOS -- --   LCBC:  Lab 01/14/13 0630 01/13/13 0600  WBC 17.2* 13.0*  NEUTROABS -- --  HGB 14.4 14.9  HCT 42.3 44.8  MCV 98.4 97.6  PLT 167 141*   Cardiac Enzymes:  Lab 01/12/13 1217 01/12/13 0643 01/12/13 0100  CKTOTAL -- -- --  CKMB -- -- --  CKMBINDEX -- -- --  TROPONINI <0.30 <0.30 <0.30   CBG:  Lab 01/14/13 0607 01/14/13 0306 01/13/13 2100 01/13/13 1114 01/12/13 2251  GLUCAP 234* 249* 347* 222* 332*     Micro Results: No results found for this or any previous visit (from the past 240 hour(s)).  Studies/Results: Dg Chest 2 View  01/11/2013  *RADIOLOGY REPORT*  Clinical Data: Chest pain and shortness of breath.  CHEST - 2 VIEW  Comparison: Two-view chest 03/31/2011.  Findings: The heart size is normal.  Mild prominence of pulmonary arteries is stable.  Scarring at the lung bases is similar to the prior exam.  The patient is status post median sternotomy for CABG. The visualized soft tissues and bony thorax are otherwise unremarkable.  IMPRESSION:  1.  No acute cardiopulmonary disease or significant interval change. 2.  Status post CABG. 3.  Stable scarring at the lung bases bilaterally.   Original Report Authenticated By: Marin Roberts, M.D.     Medications: Scheduled Meds:    . aspirin EC  81 mg Oral Daily  . atorvastatin  40 mg Oral Daily  . clopidogrel  75 mg Oral Daily  . diltiazem  360 mg Oral Daily  . ezetimibe  10 mg Oral Daily  . furosemide  40 mg Oral Daily  . ipratropium  0.5 mg Nebulization TID   And  . levalbuterol  0.63 mg Nebulization TID  . isosorbide mononitrate  120 mg Oral Daily  . isosorbide mononitrate  60 mg Oral Q1500  . leflunomide  20 mg Oral Daily  . levothyroxine  175 mcg Oral Daily  . methylPREDNISolone (SOLU-MEDROL) injection  40 mg Intravenous Q8H  . metoprolol tartrate  25 mg Oral BID   . sodium chloride  3 mL Intravenous Q12H  . Warfarin - Pharmacist Dosing Inpatient   Does not apply q1800      LOS: 3 days   RAI,RIPUDEEP M.D. [facility] 01/14/2013, 9:51 AM Pager: [phone]  If 7PM-7AM, please contact night-coverage [email or web address] Password [email or web address]

## 2013-01-14 NOTE — Progress Notes (Signed)
ANTICOAGULATION CONSULT NOTE - Follow Up Consult  Pharmacy Consult for Warfarin Indication: atrial fibrillation  Allergies  Allergen Reactions  . Clarithromycin Other (See Comments)    Unknown, thinks "blisters in mouth"  . Influenza Vaccines Other (See Comments)    Severe coughing for weeks after vaccine; also had flu like symptoms for about 7 to 8 weeks.   . Lisinopril Other (See Comments)    cough  . Ranexa (Ranolazine Er)     Patient Measurements: Height: 6' (182.9 cm) Weight: 205 lb (92.987 kg) IBW/kg (Calculated) : 77.6   Vital Signs: Temp: 98.7 F (37.1 C) (02/01 1018) Temp src: Oral (02/01 1018) BP: 119/80 mmHg (02/01 1018) Pulse Rate: 95  (02/01 1018)  Labs:  Basename 01/14/13 0630 01/13/13 1745 01/13/13 0600 01/12/13 1508 01/12/13 1217 01/12/13 0645 01/12/13 0643 01/12/13 0100 01/11/13 1835  HGB 14.4 -- 14.9 -- -- -- -- -- --  HCT 42.3 -- 44.8 -- -- 46.2 -- -- --  PLT 167 -- 141* -- -- 152 -- -- --  APTT -- -- -- -- -- -- -- -- 34  LABPROT -- 19.0* 18.6* -- -- 20.8* -- -- --  INR -- 1.65* 1.61* -- -- 1.87* -- -- --  HEPARINUNFRC -- -- 0.49 0.33 -- 0.25* -- -- --  CREATININE 1.13 -- 1.03 -- -- 1.27 -- -- --  CKTOTAL -- -- -- -- -- -- -- -- --  CKMB -- -- -- -- -- -- -- -- --  TROPONINI -- -- -- -- <0.30 -- <0.30 <0.30 --    Estimated Creatinine Clearance: 63.9 ml/min (by C-G formula based on Cr of 1.13).   Medications:  Scheduled:    . [COMPLETED] aspirin  324 mg Oral Pre-Cath  . aspirin EC  81 mg Oral Daily  . atorvastatin  40 mg Oral Daily  . clopidogrel  75 mg Oral Daily  . [COMPLETED] diazepam  2 mg Oral On Call  . diltiazem  360 mg Oral Daily  . ezetimibe  10 mg Oral Daily  . [COMPLETED] fentaNYL      . furosemide  40 mg Oral Daily  . [COMPLETED] heparin      . insulin aspart  0-15 Units Subcutaneous TID WC  . insulin aspart  0-5 Units Subcutaneous QHS  . ipratropium  0.5 mg Nebulization TID   And  . levalbuterol  0.63 mg Nebulization TID   . isosorbide mononitrate  120 mg Oral Daily  . isosorbide mononitrate  60 mg Oral Q1500  . leflunomide  20 mg Oral Daily  . levothyroxine  175 mcg Oral Daily  . [COMPLETED] lidocaine      . metoprolol tartrate  25 mg Oral BID  . [COMPLETED] midazolam      . [COMPLETED] nitroGLYCERIN      . predniSONE  40 mg Oral Q breakfast  . sodium chloride  3 mL Intravenous Q12H  . [COMPLETED] warfarin  5 mg Oral ONCE-1800  . Warfarin - Pharmacist Dosing Inpatient   Does not apply q1800  . [DISCONTINUED] diltiazem  240 mg Oral Daily  . [DISCONTINUED] isosorbide mononitrate  60 mg Oral Daily  . [DISCONTINUED] losartan  12.5 mg Oral Daily  . [DISCONTINUED] methylPREDNISolone (SOLU-MEDROL) injection  60 mg Intravenous Q6H  . [DISCONTINUED] methylPREDNISolone (SOLU-MEDROL) injection  40 mg Intravenous Q8H  . [DISCONTINUED] sodium chloride  3 mL Intravenous Q12H  . [DISCONTINUED] sodium chloride  3 mL Intravenous Q12H  . [DISCONTINUED] warfarin  5 mg Oral Daily  . [  DISCONTINUED] Warfarin - Physician Dosing Inpatient   Does not apply q1800    Assessment: 72 y/o M who takes warfarin PTA for Afib. Pt is s/p cath from 1/31 and off heparin drip. INR is 1.65 after two dose of 5mg  of warfarin on 1/31 (not sure why two doses?). PTA dose of warfarin is 5mg  on Mon/Wed and 7.5mg  all other days. CBC is stable. Renal function stable. No bleeding reported.  Goal of Therapy:  INR 2-3 Monitor platelets by anticoagulation protocol: Yes   Plan:  - Warfarin 7.5mg  x 1 tonight at 1800 - Daily PT/INR - Monitor for bleeding  Thank you for the consult,   Abran Duke, PharmD Clinical Pharmacist Phone: [phone] Pager: [phone] 01/14/2013 10:26 AM

## 2013-01-14 NOTE — Progress Notes (Signed)
[facility]  DAILY PROGRESS NOTE   Subjective:  No recurrence of angina (at rest since cath) despite discontinuation of IV NTG. Dyspnea at baseline.  Objective:  Temp:  [97.1 F (36.2 C)-97.9 F (36.6 C)] 97.1 F (36.2 C) (02/01 0535) Pulse Rate:  [63-97] 75  (02/01 0535) Resp:  [18-20] 18  (02/01 0535) BP: (104-122)/(70-84) 116/84 mmHg (02/01 0535) SpO2:  [92 %-97 %] 93 % (02/01 0811) Weight:  [92.987 kg (205 lb)] 92.987 kg (205 lb) (02/01 0535) Weight change: 1.134 kg (2 lb 8 oz)  Intake/Output from previous day: 01/31 0701 - 02/01 0700 In: 1240.9 [P.O.:440; I.V.:800.9] Out: 2550 [Urine:2550]  Intake/Output from this shift:    Medications: Current Facility-Administered Medications  Medication Dose Route Frequency Provider Last Rate Last Dose  . 0.9 %  sodium chloride infusion  250 mL Intravenous PRN Tarry Kos, MD   250 mL at 01/13/13 1252  . 0.9 %  sodium chloride infusion  250 mL Intravenous PRN Marykay Lex, MD      . acetaminophen (TYLENOL) tablet 650 mg  650 mg Oral Q4H PRN Tarry Kos, MD   650 mg at 01/13/13 0704  . aspirin EC tablet 81 mg  81 mg Oral Daily Tarry Kos, MD   81 mg at 01/12/13 1004  . atorvastatin (LIPITOR) tablet 40 mg  40 mg Oral Daily Tarry Kos, MD   40 mg at 01/13/13 1057  . clopidogrel (PLAVIX) tablet 75 mg  75 mg Oral Daily Tarry Kos, MD   75 mg at 01/13/13 1057  . diltiazem (DILACOR XR) 24 hr capsule 360 mg  360 mg Oral Daily Dheeraj Hail, MD      . ezetimibe (ZETIA) tablet 10 mg  10 mg Oral Daily Tarry Kos, MD   10 mg at 01/13/13 1058  . furosemide (LASIX) tablet 40 mg  40 mg Oral Daily Tarry Kos, MD   40 mg at 01/13/13 1057  . ipratropium (ATROVENT) nebulizer solution 0.5 mg  0.5 mg Nebulization TID Ripudeep K Rai, MD   0.5 mg at 01/14/13 0800   And  . levalbuterol (XOPENEX) nebulizer solution 0.63 mg  0.63 mg Nebulization TID Ripudeep K Rai, MD   0.63 mg at 01/14/13 0800  . isosorbide  mononitrate (IMDUR) 24 hr tablet 120 mg  120 mg Oral Daily Trevaughn Schear, MD      . isosorbide mononitrate (IMDUR) 24 hr tablet 60 mg  60 mg Oral Q1500 Alandra Sando, MD      . leflunomide (ARAVA) tablet 20 mg  20 mg Oral Daily Tarry Kos, MD   20 mg at 01/13/13 1058  . levalbuterol (XOPENEX) nebulizer solution 0.63 mg  0.63 mg Nebulization Q2H PRN Ripudeep K Rai, MD      . levothyroxine (SYNTHROID, LEVOTHROID) tablet 175 mcg  175 mcg Oral Daily Tarry Kos, MD   175 mcg at 01/13/13 1058  . methylPREDNISolone sodium succinate (SOLU-MEDROL) 40 mg/mL injection 40 mg  40 mg Intravenous Q8H Ripudeep K Rai, MD   40 mg at 01/14/13 0315  . metoprolol tartrate (LOPRESSOR) tablet 25 mg  25 mg Oral BID Tarry Kos, MD   25 mg at 01/13/13 2121  . nitroGLYCERIN (NITROSTAT) SL tablet 0.4 mg  0.4 mg Sublingual Q5 Min x 3 PRN Tarry Kos, MD      . ondansetron [facility]) injection 4 mg  4 mg Intravenous Q6H PRN Tarry Kos, MD      . sodium chloride 0.9 % injection  3 mL  3 mL Intravenous Q12H Marykay Lex, MD   3 mL at 01/14/13 0316  . sodium chloride 0.9 % injection 3 mL  3 mL Intravenous PRN Marykay Lex, MD      . Warfarin - Pharmacist Dosing Inpatient   Does not apply q1800 Chinita Greenland, [facility]        Physical Exam: General appearance: alert, cooperative and no distress Neck: no adenopathy, no carotid bruit, no JVD, supple, symmetrical, trachea midline and thyroid not enlarged, symmetric, no tenderness/mass/nodules Lungs: bilateral crackles and very rare wheezes Heart: regular rate and rhythm and S1, S2 normal Extremities: extremities normal, atraumatic, no cyanosis or edema and no hematoma at femoral access site Neurologic: Grossly normal  Lab Results: Results for orders placed during the hospital encounter of 01/11/13 (from the past 48 hour(s))  TROPONIN I     Status: Normal   Collection Time   01/12/13 12:17 PM      Component Value Range Comment   Troponin I <0.30  <0.30 ng/mL    HEPARIN LEVEL (UNFRACTIONATED)     Status: Normal   Collection Time   01/12/13  3:08 PM      Component Value Range Comment   Heparin Unfractionated 0.33  0.30 - 0.70 IU/mL   TSH     Status: Abnormal   Collection Time   01/12/13  3:08 PM      Component Value Range Comment   TSH 0.032 (*) 0.350 - 4.500 uIU/mL   GLUCOSE, CAPILLARY     Status: Abnormal   Collection Time   01/12/13 10:51 PM      Component Value Range Comment   Glucose-Capillary 332 (*) 70 - 99 mg/dL   HEPARIN LEVEL (UNFRACTIONATED)     Status: Normal   Collection Time   01/13/13  6:00 AM      Component Value Range Comment   Heparin Unfractionated 0.49  0.30 - 0.70 IU/mL   BASIC METABOLIC PANEL     Status: Abnormal   Collection Time   01/13/13  6:00 AM      Component Value Range Comment   Sodium 134 (*) 135 - 145 mEq/L DELTA CHECK NOTED   Potassium 4.5  3.5 - 5.1 mEq/L DELTA CHECK NOTED   Chloride 98  96 - 112 mEq/L    CO2 25  19 - 32 mEq/L    Glucose, Bld 270 (*) 70 - 99 mg/dL    BUN 28 (*) 6 - 23 mg/dL DELTA CHECK NOTED   Creatinine, Ser 1.03  0.50 - 1.35 mg/dL    Calcium 8.9  8.4 - 40.9 mg/dL    GFR calc non Af Amer 70 (*) >90 mL/min    GFR calc Af Amer 81 (*) >90 mL/min   CBC     Status: Abnormal   Collection Time   01/13/13  6:00 AM      Component Value Range Comment   WBC 13.0 (*) 4.0 - 10.5 K/uL    RBC 4.59  4.22 - 5.81 MIL/uL    Hemoglobin 14.9  13.0 - 17.0 g/dL    HCT 81.1  91.4 - 78.2 %    MCV 97.6  78.0 - 100.0 fL    MCH 32.5  26.0 - 34.0 pg    MCHC 33.3  30.0 - 36.0 g/dL    RDW 95.6  21.3 - 08.6 %    Platelets 141 (*) 150 - 400 K/uL   PROTIME-INR     Status: Abnormal  Collection Time   01/13/13  6:00 AM      Component Value Range Comment   Prothrombin Time 18.6 (*) 11.6 - 15.2 seconds    INR 1.61 (*) 0.00 - 1.49   PLATELET INHIBITION P2Y12     Status: Normal   Collection Time   01/13/13  6:00 AM      Component Value Range Comment   Platelet Function  P2Y12 207  194 - 418 PRU   GLUCOSE,  CAPILLARY     Status: Abnormal   Collection Time   01/13/13 11:14 AM      Component Value Range Comment   Glucose-Capillary 222 (*) 70 - 99 mg/dL    Comment 1 Documented in Chart     PROTIME-INR     Status: Abnormal   Collection Time   01/13/13  5:45 PM      Component Value Range Comment   Prothrombin Time 19.0 (*) 11.6 - 15.2 seconds    INR 1.65 (*) 0.00 - 1.49   GLUCOSE, CAPILLARY     Status: Abnormal   Collection Time   01/13/13  9:00 PM      Component Value Range Comment   Glucose-Capillary 347 (*) 70 - 99 mg/dL    Comment 1 Notify RN      Comment 2 Documented in Chart     GLUCOSE, CAPILLARY     Status: Abnormal   Collection Time   01/14/13  3:06 AM      Component Value Range Comment   Glucose-Capillary 249 (*) 70 - 99 mg/dL   GLUCOSE, CAPILLARY     Status: Abnormal   Collection Time   01/14/13  6:07 AM      Component Value Range Comment   Glucose-Capillary 234 (*) 70 - 99 mg/dL    Comment 1 Notify RN     BASIC METABOLIC PANEL     Status: Abnormal   Collection Time   01/14/13  6:30 AM      Component Value Range Comment   Sodium 136  135 - 145 mEq/L    Potassium 4.3  3.5 - 5.1 mEq/L    Chloride 99  96 - 112 mEq/L    CO2 23  19 - 32 mEq/L    Glucose, Bld 247 (*) 70 - 99 mg/dL    BUN 32 (*) 6 - 23 mg/dL    Creatinine, Ser 1.19  0.50 - 1.35 mg/dL    Calcium 8.5  8.4 - 14.7 mg/dL    GFR calc non Af Amer 63 (*) >90 mL/min    GFR calc Af Amer 73 (*) >90 mL/min   CBC     Status: Abnormal   Collection Time   01/14/13  6:30 AM      Component Value Range Comment   WBC 17.2 (*) 4.0 - 10.5 K/uL    RBC 4.30  4.22 - 5.81 MIL/uL    Hemoglobin 14.4  13.0 - 17.0 g/dL    HCT 82.9  56.2 - 13.0 %    MCV 98.4  78.0 - 100.0 fL    MCH 33.5  26.0 - 34.0 pg    MCHC 34.0  30.0 - 36.0 g/dL    RDW 86.5  78.4 - 69.6 %    Platelets 167  150 - 400 K/uL     Imaging: No results found.  Assessment:  1. Principal Problem: 2.  *Unstable angina 3. Active Problems: 4.  DYSLIPIDEMIA 5.   HYPERTENSION 6.  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 7.  Atrial fibrillation, permanent 8.  Chronic anticoagulation, on coumadin 9.  Cardiomyopathy, ischemic EF 25% 10.  Bronchitis, acute 11.   Plan:  Unfortunately, his culprit lesion in the inferior wall circulation cannot be safely treated with PCI without very high risk of distal embolization and very high risk of precipitating no reflow/infarction.  Will try to boost antianginal therapy as much as BP allows. DC losartan. Increase isosorbide mono to 120 mg AM + 60 mg PM. Increase diltiazem to 360 mg a day.  He reports that Ranexa was not stopped due to adverse effects in the past, but due to apparent ineffectiveness. We might try again, but to use full 1000 mg bid dose would have to replace diltiazem with amlodipine due to CYP3A4 metabolism interaction.  May discharge this afternoon if his BP tolerates these changes and he is free of angina during ambulation.  Time Spent Directly with Patient:  30 minutes  Length of Stay:  LOS: 3 days    Cortana Vanderford 01/14/2013, 9:48 AM

## 2013-01-14 NOTE — CV Procedure (Addendum)
[facility]     CARDIAC CATHETERIZATION REPORT  NAME: [name]   MRN: [id number] DATE OF BIRTH: [birth year]   ADMIT DATE:  01/11/2013   Performing Cardiologist: Marykay Lex Primary Physician: Thayer Headings, MD Primary Cardiologist:  [name], MD  Procedures Performed:  Left Heart Catheterization via 5 Fr Right Common Femoral access  Left Ventriculography, (RAO/LAO) 12 ml/sec for 25 ml total contrast  Native Coronary Angiography  Left Internal Mammary Graft - D2-LAD Angiography  Saphenous Vein Graft Angiography  Indication(s): Progressively worsening dyspnea and exertional Angina (Sub-sternal chest pain that is suggestive of angina)  Known CAD with CABG   History: 72 y.o. male with a long-standing history of CAD with CABG, who is a patient of Dr. [name].  He was admitted on afternoon of 01/12/2013 with complaints of 2 weeks of waxing/waning sscp with associated sob no radiation that is exertional and better with rest. Usually relieved with sl ntg at home but over the last week this has really not be as effective. Saw his cardiologist Dr [name] about 3 weeks ago and at that time was not having this cp. cabg in 1998. No le edema. No fevers. No n/v. Was placed on ntg gtt in ED and was cp free on IM exam. Chest pain increased over the last 2 days. With rest and exertion.  He was admitted following consultation by Dr. [name]. Currently no further chest pain on the NTG drip. + wheezes on exam. Pt's prednisone had been increased to 20 mg daily, up from 5 mg.  Due to the nature of his chest pain, Dr. [name] referred him for cardiac catheterization.  Consent: The procedure with Risks/Benefits/Alternatives and Indications was reviewed with the patient (and family).  All questions were answered.   Risks / Complications include, but not limited to: Death, MI, CVA/TIA, VF/VT (with defibrillation), Bradycardia (need for temporary pacer placement),  contrast induced nephropathy, bleeding / bruising / hematoma / pseudoaneurysm, vascular or coronary injury (with possible emergent CT or Vascular Surgery), adverse medication reactions, infection.    The patient (and family) voices understanding and agree to proceed.    Consent for signed by MD and patient with RN witness -- placed on chart.  Procedure: The patient was brought to the 2nd Floor [facility] in the fasting state and prepped and draped in the usual sterile fashion for Right groin access. Sterile technique was used including antiseptics, cap, gloves, gown, hand hygiene, mask and sheet.  Skin prep: Chlorhexidine.  Time Out: Verified patient identification, verified procedure, site/side was marked, verified correct patient position, special equipment/implants available, medications/allergies/relevent history reviewed, required imaging and test results available.  Performed  The right femoral head was identified using tactile and fluoroscopic technique.  The right groin was anesthetized with 1% subcutaneous Lidocaine.  The right Common Femoral Artery was accessed using the Modified Seldinger Technique with placement of a antimicrobial bonded/coated single lumen (5 Fr) sheath.  The sheath was aspirated and flushed.    A 5 Fr JL4 Catheter was advanced of over a Standard J wire into the ascending Aorta, and used to engage the native Left Coronary Artery for Multiple cineangiographic views.  This catheter was then exchanged over the J-wire for a 5 Fr JR4 catheter for Right Coronary Artery as well as SVG-rPDA and SVG-OM cineangiography.  The catheter was "re-shaped" in the aortic root to allow SVG-OM engagement.  The catheter was then redirected into the Left Subclavian Artery, and echanged  for an IMA catheter for LIMA-D2-LAD graft angiography.  This catheter was then exchanged over the J wire for an angled Pigtail catheter that was advanced across the Aortic Valve.  LV  hemodynamics were measured left Ventriculography was performed.  LV hemodynamics were then re-sampled, and the catheter was pulled back across the Aortic Valve for measurement of "pull-back" gradient.  The catheter and the wire were removed completely out of the body.  After a brief common Right Femoral angiogram demonstrated excellent sheath positioning for Mynx closure, the arteriotomy was closed with a 5 Fr Mynx closure device (and 3-5 min of manual pressure), with excellent hemostasis.  The patient was transported to the PACU holding area in a chest pain free, hemodynamically stable condition.   The patient  was stable before, during and following the procedure.   Patient did tolerate procedure well. There were not complications. EBL: < 20ml  Medications:  Premedication: 5 mg  Valium  Sedation:  1 mg IV Versed, 25 IV mcg Fentanyl  Omnipaque Contrast: 130 ml    Hemodynamics:  Central Aortic Pressure / Mean Aortic Pressure: 100/64 mm Hg; 69 mmHg  LV Pressure / LV End diastolic Pressure:  89/9 mmHg; 19 mmHg  Left Ventriculography:  EF:  40-45%  Wall Motion: basal inferolateral and lateral hypokinesis.  Coronary Angiographic Data:  Left Main:  Large caliber vessel with notable calcification, but no significant stenosis. Left Anterior Descending (LAD):  Likely large caliber vessel with diffuse proximal disease, then occluded just after the First Diagonal (D1), which is also essentially occluded. Circumflex (LCx): ~90-95% mid-vessel stenosis with to-fro-flow noted in the major lateral OM.  The distal vessel continues as a small caliber, diffusely diseased Left posterolateral branch.  Right Coronary Artery: dominant with a 50% proximal eccentric stenosis. The PDA had minimal flow and was subtotally occluded. The continuation to the significant PLA branch is widely patent.  posterior descending artery: there is an occluded stent at what appears to be the ostium.  posterior lateral  branch:  Fills both antegrade from the native RCA rettrograde (as well as PDA-PL collaterals) from the SVG-PDA.  Grafts  LIMA - Diag - LAD: Widely patent with excellent antegrade flow down both the diagonal and the LAD.    SVG - OM: Widely patent to a large lateral OM.  Retrograde flow fills back to the AV Groove Circumflex and fills a smaller OM  and a LPL branch.  SVG - RCA (RPDA/RPL): patent graft with extensive / diffuse ostial to proximal stenosis ranging from 50-70 % followed by an ectatic segment in the mid-graft -- there has been notable progression of disease since his most recent cath.  The distal insertion site (with notable size mismatch) has a ~70% stenosis that is similar to the prior cath.    There is a focal ~95% stenosis in the mid-[segment of the small caliber (< 2mm) PDA that may potentially be the "culprit lesion".  Not likely a PCI target based upon size and location -- downstream from the stenosed insertion site of a diffusely diseased vein graft.  Impression:  Severe native CAD with notable progression of disease in the SVG-RPDA followed by a focal severe stenosis in the grafted vessel.  This lesion is likely not safely "reached" for PCI, due to the occlusion of the native PDA requiring advancing PTCA equipment through the grossly diseased SVG.  Moderate Ischemic CM with no notable change in EF.  Besides the PDA lesion and SVG-RCA disease, there is no notably change  in the remaining angiographic imaging.  Plan:  Post catheterization care.  Would plan to optimize medical therapy for anginal relief -- may considering Ranexa.  Will need to restart oral anticoagulation.  The case and results was discussed with the patient (and family). The case and results was not discussed with the patient's PCP. The case and results was discussed with the patient's Cardiologist.  Time Spend Directly with Patient:  45 minutes  HARDING,DAVID W, M.D., M.S. [facility] [address] [place], [place]  [address]  [phone]  01/13/2013 1620

## 2013-01-14 NOTE — Plan of Care (Signed)
Problem: Phase I Progression Outcomes Goal: Post Cath/PCI return to appropriate Path Outcome: Completed/Met Date Met:  01/14/13 Pt had R groin cath done 01/13/13, site level 0, goal met  Problem: Phase II Progression Outcomes Goal: Tolerates diet Outcome: Completed/Met Date Met:  01/14/13 Pt on carb modified diet, tolerates well, ISS ordered for bld sugars, goal met Goal: Activity appropriate for discharge plan Outcome: Completed/Met Date Met:  01/14/13 Pt oob with supervision, no c/o SOB, goal met Goal: Vascular site scale level 0 - I Vascular Site Scale Level 0: No bruising/bleeding/hematoma Level I (Mild): Bruising/Ecchymosis, minimal bleeding/ooozing, palpable hematoma < 3 cm Level II (Moderate): Bleeding not affecting hemodynamic parameters, pseudoaneurysm, palpable hematoma > 3 cm  Outcome: Completed/Met Date Met:  01/14/13 Vascular site level 0, goal met Goal: Other Discharge Outcomes/Goals Outcome: Progressing Pt ambulated in hall, BP checked before and after walk, BP WNL will monitor

## 2013-01-15 DIAGNOSIS — I472 Ventricular tachycardia: Secondary | ICD-10-CM | POA: Diagnosis not present

## 2013-01-15 DIAGNOSIS — J441 Chronic obstructive pulmonary disease with (acute) exacerbation: Secondary | ICD-10-CM | POA: Diagnosis present

## 2013-01-15 LAB — BASIC METABOLIC PANEL
Chloride: 100 mEq/L (ref 96–112)
Creatinine, Ser: 1.09 mg/dL (ref 0.50–1.35)
GFR calc Af Amer: 76 mL/min — ABNORMAL LOW (ref >90)
GFR calc non Af Amer: 65 mL/min — ABNORMAL LOW (ref >90)
Potassium: 4.2 mEq/L (ref 3.5–5.1)

## 2013-01-15 LAB — CBC
HCT: 37.1 % — ABNORMAL LOW (ref 39.0–52.0)
MCHC: 33.7 g/dL (ref 30.0–36.0)
RDW: 15.2 % (ref 11.5–15.5)
WBC: 12.2 10*3/uL — ABNORMAL HIGH (ref 4.0–10.5)

## 2013-01-15 LAB — GLUCOSE, CAPILLARY: Glucose-Capillary: 200 mg/dL — ABNORMAL HIGH (ref 70–99)

## 2013-01-15 MED ORDER — DILTIAZEM HCL ER 180 MG PO CP24: 360.0000 mg | ORAL_CAPSULE | Freq: Every day | ORAL | Status: DC

## 2013-01-15 MED ORDER — ISOSORBIDE MONONITRATE ER 60 MG PO TB24: 120.0000 mg | ORAL_TABLET | Freq: Every day | ORAL | Status: DC

## 2013-01-15 MED ORDER — PREDNISONE 5 MG PO TABS: ORAL_TABLET | ORAL | Status: DC

## 2013-01-15 MED ORDER — WARFARIN SODIUM 2.5 MG PO TABS
2.5000 mg | ORAL_TABLET | Freq: Once | ORAL | Status: DC
  Filled 2013-01-15: qty 1

## 2013-01-15 NOTE — Plan of Care (Signed)
Problem: Discharge Progression Outcomes Goal: No anginal pain Outcome: Completed/Met Date Met:  01/15/13 Pt has not had any c/o anginal pain, goal met Goal: Hemodynamically stable Outcome: Completed/Met Date Met:  01/15/13 Pt stable for d/c Goal: Discharge plan in place and appropriate Outcome: Completed/Met Date Met:  01/15/13 Pt being d/c home with wife, pt given d/c instructions, follow up appointments, and medications have been reviewed, pt verbalized understanding, pt leaving via wheelchair with family, pt stable upon d/c Goal: Vascular site scale level 0 - I Vascular Site Scale Level 0: No bruising/bleeding/hematoma Level I (Mild): Bruising/Ecchymosis, minimal bleeding/ooozing, palpable hematoma < 3 cm Level II (Moderate): Bleeding not affecting hemodynamic parameters, pseudoaneurysm, palpable hematoma > 3 cm  Outcome: Completed/Met Date Met:  01/15/13 Right groin site level 0 Goal: Tolerates diet Outcome: Completed/Met Date Met:  01/15/13 Pt on carb modified diet, tolerating well, no n/v Goal: Activity appropriate for discharge plan Outcome: Completed/Met Date Met:  01/15/13 Pt ambulated in hall way without any c/o anginal pain, pt BP taken before and after walk, WNL, goal met

## 2013-01-15 NOTE — Progress Notes (Signed)
ANTICOAGULATION CONSULT NOTE - Follow Up Consult  Pharmacy Consult for Warfarin Indication: atrial fibrillation  Allergies  Allergen Reactions  . Clarithromycin Other (See Comments)    Unknown, thinks "blisters in mouth"  . Influenza Vaccines Other (See Comments)    Severe coughing for weeks after vaccine; also had flu like symptoms for about 7 to 8 weeks.   . Lisinopril Other (See Comments)    cough  . Ranexa (Ranolazine Er)     Patient Measurements: Height: 6' (182.9 cm) Weight: 207 lb 3.7 oz (94 kg) (scale c) IBW/kg (Calculated) : 77.6   Vital Signs: Temp: 95.9 F (35.5 C) (02/02 0945) Temp src: Oral (02/02 0945) BP: 139/90 mmHg (02/02 1127) Pulse Rate: 99  (02/02 1127)  Labs:  Basename 01/15/13 1009 01/15/13 0505 01/14/13 0630 01/13/13 1745 01/13/13 0600 01/12/13 1508 01/12/13 1217  HGB -- 12.5* 14.4 -- -- -- --  HCT -- 37.1* 42.3 -- 44.8 -- --  PLT -- 131* 167 -- 141* -- --  APTT -- -- -- -- -- -- --  LABPROT 29.0* -- -- 19.0* 18.6* -- --  INR 2.92* -- -- 1.65* 1.61* -- --  HEPARINUNFRC -- -- -- -- 0.49 0.33 --  CREATININE -- 1.09 1.13 -- 1.03 -- --  CKTOTAL -- -- -- -- -- -- --  CKMB -- -- -- -- -- -- --  TROPONINI -- -- -- -- -- -- <0.30    Estimated Creatinine Clearance: 71.9 ml/min (by C-G formula based on Cr of 1.09).   Medications:  Scheduled:     . aspirin EC  81 mg Oral Daily  . atorvastatin  40 mg Oral Daily  . clopidogrel  75 mg Oral Daily  . diltiazem  360 mg Oral Daily  . ezetimibe  10 mg Oral Daily  . furosemide  40 mg Oral Daily  . insulin aspart  0-15 Units Subcutaneous TID WC  . insulin aspart  0-5 Units Subcutaneous QHS  . ipratropium  0.5 mg Nebulization TID   And  . levalbuterol  0.63 mg Nebulization TID  . isosorbide mononitrate  120 mg Oral Daily  . isosorbide mononitrate  60 mg Oral Q1500  . leflunomide  20 mg Oral Daily  . levothyroxine  175 mcg Oral Daily  . metoprolol tartrate  25 mg Oral BID  . predniSONE  40 mg Oral Q  breakfast  . sodium chloride  3 mL Intravenous Q12H  . [COMPLETED] warfarin  7.5 mg Oral ONCE-1800  . Warfarin - Pharmacist Dosing Inpatient   Does not apply q1800    Assessment: 72 y/o M who takes warfarin PTA for Afib. Pt is s/p cath from 1/31 and off heparin drip. INR is 2.92<1.65 after two dose of 5mg  of warfarin on 1/31 (not sure why two doses?) and 7.5mg  on 2/1. PTA dose of warfarin is 5mg  on Mon/Wed and 7.5mg  all other days. CBC is stable. Renal function stable. No bleeding reported.  Goal of Therapy:  INR 2-3 Monitor platelets by anticoagulation protocol: Yes   Plan:  - Warfarin 2.5 mg x 1 tonight at 1800 (will give lower dose since patient got two doses on 1/31) - Daily PT/INR - Monitor for bleeding  Thank you for the consult,   Abran Duke, PharmD Clinical Pharmacist Phone: [phone] Pager: [phone] 01/15/2013 11:36 AM

## 2013-01-15 NOTE — Progress Notes (Signed)
Patient had a 5-beat run of V-Tach.  Patient asymptomatic.  Answering service stated that they paged MD.

## 2013-01-15 NOTE — Progress Notes (Signed)
Subjective:  NMo chest pain  Objective:  Vital Signs in the last 24 hours: Temp:  [95.9 F (35.5 C)-98 F (36.7 C)] 95.9 F (35.5 C) (02/02 0945) Pulse Rate:  [70-101] 88  (02/02 0945) Resp:  [18-22] 18  (02/02 0945) BP: (109-120)/(63-81) 120/70 mmHg (02/02 0945) SpO2:  [93 %-97 %] 95 % (02/02 0945) Weight:  [94 kg (207 lb 3.7 oz)] 94 kg (207 lb 3.7 oz) (02/02 0330)  Intake/Output from previous day:  Intake/Output Summary (Last 24 hours) at 01/15/13 1018 Last data filed at 01/15/13 7253  Gross per 24 hour  Intake   1039 ml  Output    826 ml  Net    213 ml    Physical Exam: General appearance: alert, cooperative and no distress Lungs: basilar crackles on Rt Heart: irregularly irregular rhythm   Rate: 86  Rhythm: atrial fibrillation and 5 bt NSVT  Lab Results:  Basename 01/15/13 0505 01/14/13 0630  WBC 12.2* 17.2*  HGB 12.5* 14.4  PLT 131* 167    Basename 01/15/13 0505 01/14/13 0630  NA 134* 136  K 4.2 4.3  CL 100 99  CO2 25 23  GLUCOSE 218* 247*  BUN 29* 32*  CREATININE 1.09 1.13    Basename 01/12/13 1217  TROPONINI <0.30   Hepatic Function Panel No results found for this basename: PROT,ALBUMIN,AST,ALT,ALKPHOS,BILITOT,BILIDIR,IBILI in the last 72 hours No results found for this basename: CHOL in the last 72 hours  Basename 01/13/13 1745  INR 1.65*    Imaging: Imaging results have been reviewed  Cardiac Studies:  Assessment/Plan:   Principal Problem:  *Unstable angina Active Problems:  CAD, 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  Atrial fibrillation, permanent  Chronic anticoagulation, on coumadin  Cardiomyopathy, ischemic EF 25%  Bronchitis, acute  DYSLIPIDEMIA  HYPERTENSION  NSVT (nonsustained ventricular tachycardia)  Diabetes mellitus   Plan- Discharge this am if he can ambulate without angina. He will need follow up INR and OV in one week. Rapid steroid taper.   Corine Shelter PA-C 01/15/2013, 10:18 AM   I have seen and examined the patient along with Corine Shelter PA-C.  I have reviewed the chart, notes and new data.  I agree with PA's note.  Feels well. No further angina since early yesterday. DC home if asymptomatic with ambulation. If he has angina again, add ranolazine 500 mg BID.  Thurmon Fair, MD, [facility] [phone] 01/15/2013, 10:45 AM

## 2013-01-15 NOTE — Discharge Summary (Signed)
Patient ID: RODDERICK HOLTZER,  MRN: [id number], DOB/AGE: Oct 10, 1941 72 y.o.  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 He had a LIMA to his LAD and diagonal branch sequentially, a vein to the 1st and 2nd marginal branches sequentially and to the acute marginal branch and PDA. He has moderate LV dysfunction, chronic atrial fibrillation, rate controlled on Coumadin anticoagulation.  He was referred  to Dr. Hillis Range for consideration of atrial fibrillation ablation but he thought he was not a good candidate for this. His major complaints are of chronic shortness of breath and chest pain which is nitro-responsive. 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 He does have a chronic dyspnea and a known paralyzed left hemidiaphragm.  He was last cathed  April 03, 2011 revealing a patent vein to an OM, a patent sequential LIMA to LAD and D1, and a vein to the PDA which had a 75% insertion stenosis and a 95% stenosis in the PDA beyond vein graft insertion. He also had a 95% AV groove stenosis which supplied a small posterolateral branch. His cath EF  was 20-25%, although by 2D one month prior it was 45%. He has taken  Ranexa in the past but says it didn't help his angina.  He was admitted 01/11/13  with 2 weeks of waxing/waning sscp with associated sob no radiation that was exertional and better with rest. His symptoms had been  relieved with sl ntg at home but over the last week that had really not be as effective. He saw his cardiologist Dr [name] about 3 weeks ago and at that time was not having this cp. He was admitted and started on IV Nitrates. He ruled out for an MI. We did feel he was having a COPD exacerbation and steroids were increased. He underwent diagnostic cath 01/14/13  Which revealed progression in SVG-PDA with disease in the native vessel after the graft. It was felt this was not amenable to PCI and the plan is for medical Rx. His medications were adjusted and Dr Royann Shivers suggest we try Ranexa 500mg  daily if he has recurrent symptoms. His EF was noted to be depressed but in reviewing his recorxds its noted that his EF has been labile- from 55%-25%. He has severe lung disease and Dr Royann Shivers did not feel he was a candidate for an ICD. He will need follow up echo in a few months as an OP. He will follow up in one week in the office with an extender visit and an INR check.    MEDICATIONS:    Discharge Vitals:  Blood pressure 152/91, pulse 89, temperature 95.9 F (35.5 C), temperature source Oral, resp. rate 18, height 6' (1.829 m), weight 94 kg (207  lb 3.7 oz), SpO2 98.00%.    Labs: Results for orders placed during the hospital encounter of 01/11/13 (from the past 48 hour(s))  PROTIME-INR     Status: Abnormal   Collection Time   01/13/13  5:45 PM      Component Value Range Comment   Prothrombin Time 19.0 (*) 11.6 - 15.2 seconds    INR 1.65 (*) 0.00 - 1.49   GLUCOSE, CAPILLARY     Status: Abnormal   Collection Time   01/13/13  9:00 PM      Component Value Range Comment   Glucose-Capillary 347 (*) 70 - 99 mg/dL    Comment 1 Notify  RN      Comment 2 Documented in Chart     GLUCOSE, CAPILLARY     Status: Abnormal   Collection Time   01/14/13  3:06 AM      Component Value Range Comment   Glucose-Capillary 249 (*) 70 - 99 mg/dL   GLUCOSE, CAPILLARY     Status: Abnormal   Collection Time   01/14/13  6:07 AM      Component Value Range Comment   Glucose-Capillary 234 (*) 70 - 99 mg/dL    Comment 1 Notify RN     BASIC METABOLIC PANEL     Status: Abnormal   Collection Time   01/14/13  6:30 AM      Component Value Range Comment   Sodium 136  135 - 145 mEq/L    Potassium 4.3  3.5 - 5.1 mEq/L    Chloride 99  96 - 112 mEq/L    CO2 23  19 - 32 mEq/L    Glucose, Bld 247 (*) 70 - 99 mg/dL    BUN 32 (*) 6 - 23 mg/dL    Creatinine, Ser 9.62  0.50 - 1.35 mg/dL    Calcium 8.5  8.4 - 95.2 mg/dL    GFR calc non Af Amer 63 (*) >90 mL/min    GFR calc Af Amer 73 (*) >90 mL/min   CBC     Status: Abnormal   Collection Time   01/14/13  6:30 AM      Component Value Range Comment   WBC 17.2 (*) 4.0 - 10.5 K/uL    RBC 4.30  4.22 - 5.81 MIL/uL    Hemoglobin 14.4  13.0 - 17.0 g/dL    HCT 84.1  32.4 - 40.1 %    MCV 98.4  78.0 - 100.0 fL    MCH 33.5  26.0 - 34.0 pg    MCHC 34.0  30.0 - 36.0 g/dL    RDW 02.7  25.3 - 66.4 %    Platelets 167  150 - 400 K/uL   TSH     Status: Abnormal   Collection Time   01/14/13  6:30 AM      Component Value Range Comment   TSH 0.022 (*) 0.350 - 4.500 uIU/mL   T4, FREE     Status: Normal   Collection Time   01/14/13  6:30 AM      Component Value Range Comment   Free T4 1.17  0.80 - 1.80 ng/dL   HEMOGLOBIN Q0H     Status: Abnormal   Collection Time   01/14/13 10:13 AM      Component Value Range Comment   Hemoglobin A1C 7.2 (*) <5.7 %    Mean Plasma Glucose 160 (*) <117 mg/dL   GLUCOSE, CAPILLARY     Status: Abnormal   Collection Time  01/14/13 11:00 AM      Component Value Range Comment   Glucose-Capillary 305 (*) 70 - 99 mg/dL   GLUCOSE, CAPILLARY     Status: Abnormal   Collection Time   01/14/13   3:45 PM      Component Value Range Comment   Glucose-Capillary 342 (*) 70 - 99 mg/dL   GLUCOSE, CAPILLARY     Status: Abnormal   Collection Time   01/14/13  9:13 PM      Component Value Range Comment   Glucose-Capillary 318 (*) 70 - 99 mg/dL   BASIC METABOLIC PANEL     Status: Abnormal   Collection Time   01/15/13  5:05 AM      Component Value Range Comment   Sodium 134 (*) 135 - 145 mEq/L    Potassium 4.2  3.5 - 5.1 mEq/L    Chloride 100  96 - 112 mEq/L    CO2 25  19 - 32 mEq/L    Glucose, Bld 218 (*) 70 - 99 mg/dL    BUN 29 (*) 6 - 23 mg/dL    Creatinine, Ser 1.61  0.50 - 1.35 mg/dL    Calcium 7.9 (*) 8.4 - 10.5 mg/dL    GFR calc non Af Amer 65 (*) >90 mL/min    GFR calc Af Amer 76 (*) >90 mL/min   CBC     Status: Abnormal   Collection Time   01/15/13  5:05 AM      Component Value Range Comment   WBC 12.2 (*) 4.0 - 10.5 K/uL    RBC 3.84 (*) 4.22 - 5.81 MIL/uL    Hemoglobin 12.5 (*) 13.0 - 17.0 g/dL    HCT 09.6 (*) 04.5 - 52.0 %    MCV 96.6  78.0 - 100.0 fL    MCH 32.6  26.0 - 34.0 pg    MCHC 33.7  30.0 - 36.0 g/dL    RDW 40.9  81.1 - 91.4 %    Platelets 131 (*) 150 - 400 K/uL   GLUCOSE, CAPILLARY     Status: Abnormal   Collection Time   01/15/13  6:13 AM      Component Value Range Comment   Glucose-Capillary 200 (*) 70 - 99 mg/dL    Comment 1 Notify RN     PROTIME-INR     Status: Abnormal   Collection Time   01/15/13 10:09 AM      Component Value Range Comment   Prothrombin Time 29.0 (*) 11.6 - 15.2 seconds    INR 2.92 (*) 0.00 - 1.49     Disposition:  Follow-up Information    Follow up with Abelino Derrick, PA. (office will call you)    Contact information:   [address] [place] [address] [phone]       Follow up with Barbaraann Share, MD. (call office for appointment)    Contact information:   [address] [place] [address] [phone]          Discharge Medications:    Medication List     As of 01/15/2013 11:45 AM    STOP  taking these medications         losartan 25 MG tablet   Commonly known as: COZAAR      TAKE these medications         acetaminophen 650 MG CR tablet   Commonly known as: TYLENOL   Take 650 mg by mouth every 8 (eight) hours as  needed. For pain      aspirin 81 MG tablet   Take 81 mg by mouth daily.      atorvastatin 40 MG tablet   Commonly known as: LIPITOR   Take 40 mg by mouth daily.      calcium carbonate 600 MG Tabs   Commonly known as: OS-CAL   Take 600 mg by mouth daily.      clopidogrel 75 MG tablet   Commonly known as: PLAVIX   Take 75 mg by mouth daily.      diltiazem 180 MG 24 hr capsule   Commonly known as: DILACOR XR   Take 2 capsules (360 mg total) by mouth daily.      ezetimibe 10 MG tablet   Commonly known as: ZETIA   Take 10 mg by mouth daily.      Fish Oil 1000 MG Caps   Take 3,000 mg by mouth every morning.      fluticasone 50 MCG/ACT nasal spray   Commonly known as: FLONASE   Place 2 sprays into both nostrils daily.      furosemide 40 MG tablet   Commonly known as: LASIX   Take 40 mg by mouth daily.      isosorbide mononitrate 60 MG 24 hr tablet   Commonly known as: IMDUR   Take 2 tablets (120 mg total) by mouth daily.      KOMBIGLYZE XR 2.04-999 MG Tb24   Generic drug: Saxagliptin-Metformin   Take 1 tablet by mouth 2 (two) times daily.      leflunomide 20 MG tablet   Commonly known as: ARAVA   Take 20 mg by mouth daily.      levothyroxine 175 MCG tablet   Commonly known as: SYNTHROID, LEVOTHROID   Take 175 mcg by mouth daily.      metoprolol tartrate 25 MG tablet   Commonly known as: LOPRESSOR   Take 25 mg by mouth 2 (two) times daily.      nitroGLYCERIN 0.4 MG SL tablet   Commonly known as: NITROSTAT   Place 0.4 mg under the tongue every 5 (five) minutes as needed.      predniSONE 5 MG tablet   Commonly known as: DELTASONE   Take 40mg  a day X 2 days starting Monday - then 30mg  a day X 3 days, then 20mg  a day X 3 days, then  10mg  a day X 3 days, then resume your usual dose of 5mg  daily.      PRILOSEC PO   Take 1 tablet by mouth every morning.      warfarin 5 MG tablet   Commonly known as: COUMADIN   Take 5 mg by mouth daily. Takes 7.5 mg-Sun,Tues,Wed,Thurs,Sat  Takes 5 mg-Mon, Wed         Duration of Discharge Encounter: Greater than 30 minutes including physician time.  Jolene Provost PA-C 01/15/2013 11:45 AM

## 2013-01-16 LAB — GLUCOSE, CAPILLARY: Glucose-Capillary: 292 mg/dL — ABNORMAL HIGH (ref 70–99)

## 2013-01-24 ENCOUNTER — Other Ambulatory Visit: Payer: Self-pay | Admitting: Internal Medicine

## 2013-01-30 ENCOUNTER — Ambulatory Visit
Admission: RE | Admit: 2013-01-30 | Discharge: 2013-01-30 | Disposition: A | Discharge status: Home / Self Care | Payer: Medicare Other | Source: Ambulatory Visit | Attending: Internal Medicine | Admitting: Internal Medicine

## 2013-01-30 ENCOUNTER — Other Ambulatory Visit: Payer: Self-pay | Admitting: Internal Medicine

## 2013-02-16 ENCOUNTER — Encounter (HOSPITAL_COMMUNITY): Payer: Medicare Other

## 2013-02-27 ENCOUNTER — Ambulatory Visit: Payer: Self-pay | Admitting: Cardiovascular Disease

## 2013-02-27 DIAGNOSIS — I4821 Permanent atrial fibrillation: Secondary | ICD-10-CM

## 2013-02-27 DIAGNOSIS — Z7901 Long term (current) use of anticoagulants: Secondary | ICD-10-CM

## 2013-03-01 ENCOUNTER — Ambulatory Visit: Payer: Medicare Other | Admitting: Pulmonary Disease

## 2013-03-01 NOTE — Telephone Encounter (Signed)
Spoke with patient...aware of cxr results.

## 2013-03-08 ENCOUNTER — Ambulatory Visit: Payer: Medicare Other | Admitting: Adult Health

## 2013-03-08 ENCOUNTER — Encounter (HOSPITAL_COMMUNITY): Payer: Self-pay

## 2013-03-08 ENCOUNTER — Inpatient Hospital Stay (HOSPITAL_COMMUNITY)
Admission: EM | Admit: 2013-03-08 | Discharge: 2013-03-14 | DRG: 281 | Disposition: A | Discharge status: Home / Self Care | Payer: Medicare Other | Attending: Cardiology | Admitting: Cardiology

## 2013-03-08 ENCOUNTER — Emergency Department (HOSPITAL_COMMUNITY): Payer: Medicare Other

## 2013-03-08 DIAGNOSIS — I10 Essential (primary) hypertension: Secondary | ICD-10-CM

## 2013-03-08 DIAGNOSIS — Z8249 Family history of ischemic heart disease and other diseases of the circulatory system: Secondary | ICD-10-CM

## 2013-03-08 DIAGNOSIS — Z7902 Long term (current) use of antithrombotics/antiplatelets: Secondary | ICD-10-CM

## 2013-03-08 DIAGNOSIS — I4821 Permanent atrial fibrillation: Secondary | ICD-10-CM

## 2013-03-08 DIAGNOSIS — I2510 Atherosclerotic heart disease of native coronary artery without angina pectoris: Secondary | ICD-10-CM | POA: Diagnosis present

## 2013-03-08 DIAGNOSIS — I2589 Other forms of chronic ischemic heart disease: Secondary | ICD-10-CM | POA: Diagnosis present

## 2013-03-08 DIAGNOSIS — Z7982 Long term (current) use of aspirin: Secondary | ICD-10-CM

## 2013-03-08 DIAGNOSIS — Z91199 Patient's noncompliance with other medical treatment and regimen due to unspecified reason: Secondary | ICD-10-CM

## 2013-03-08 DIAGNOSIS — I509 Heart failure, unspecified: Secondary | ICD-10-CM

## 2013-03-08 DIAGNOSIS — Z951 Presence of aortocoronary bypass graft: Secondary | ICD-10-CM

## 2013-03-08 DIAGNOSIS — I255 Ischemic cardiomyopathy: Secondary | ICD-10-CM | POA: Diagnosis present

## 2013-03-08 DIAGNOSIS — Z7901 Long term (current) use of anticoagulants: Secondary | ICD-10-CM

## 2013-03-08 DIAGNOSIS — I4891 Unspecified atrial fibrillation: Secondary | ICD-10-CM

## 2013-03-08 DIAGNOSIS — Z9119 Patient's noncompliance with other medical treatment and regimen: Secondary | ICD-10-CM

## 2013-03-08 DIAGNOSIS — E119 Type 2 diabetes mellitus without complications: Secondary | ICD-10-CM | POA: Diagnosis present

## 2013-03-08 DIAGNOSIS — B028 Zoster with other complications: Secondary | ICD-10-CM | POA: Diagnosis present

## 2013-03-08 DIAGNOSIS — Z887 Allergy status to serum and vaccine status: Secondary | ICD-10-CM

## 2013-03-08 DIAGNOSIS — E785 Hyperlipidemia, unspecified: Secondary | ICD-10-CM | POA: Diagnosis present

## 2013-03-08 DIAGNOSIS — I214 Non-ST elevation (NSTEMI) myocardial infarction: Secondary | ICD-10-CM

## 2013-03-08 DIAGNOSIS — I25719 Atherosclerosis of autologous vein coronary artery bypass graft(s) with unspecified angina pectoris: Secondary | ICD-10-CM | POA: Diagnosis present

## 2013-03-08 DIAGNOSIS — Z79899 Other long term (current) drug therapy: Secondary | ICD-10-CM

## 2013-03-08 DIAGNOSIS — E871 Hypo-osmolality and hyponatremia: Secondary | ICD-10-CM | POA: Diagnosis not present

## 2013-03-08 DIAGNOSIS — I5043 Acute on chronic combined systolic (congestive) and diastolic (congestive) heart failure: Principal | ICD-10-CM | POA: Diagnosis present

## 2013-03-08 DIAGNOSIS — IMO0002 Reserved for concepts with insufficient information to code with codable children: Secondary | ICD-10-CM

## 2013-03-08 DIAGNOSIS — E663 Overweight: Secondary | ICD-10-CM | POA: Diagnosis present

## 2013-03-08 HISTORY — DX: Gastro-esophageal reflux disease without esophagitis: K21.9

## 2013-03-08 HISTORY — DX: Hypothyroidism, unspecified: E03.9

## 2013-03-08 LAB — BASIC METABOLIC PANEL
BUN: 23 mg/dL (ref 6–23)
CO2: 18 mEq/L — ABNORMAL LOW (ref 19–32)
Calcium: 8.8 mg/dL (ref 8.4–10.5)
Chloride: 101 mEq/L (ref 96–112)
Creatinine, Ser: 1.13 mg/dL (ref 0.50–1.35)
GFR calc Af Amer: 77 mL/min — ABNORMAL LOW (ref >90)
GFR calc non Af Amer: 63 mL/min — ABNORMAL LOW (ref >90)
Glucose, Bld: 239 mg/dL — ABNORMAL HIGH (ref 70–99)
Glucose, Bld: 218 mg/dL — ABNORMAL HIGH (ref 70–99)
Potassium: 5.1 mEq/L (ref 3.5–5.1)
Sodium: 136 mEq/L (ref 135–145)

## 2013-03-08 LAB — CBC
HCT: 44.3 % (ref 39.0–52.0)
Hemoglobin: 15.2 g/dL (ref 13.0–17.0)
MCH: 34.1 pg — ABNORMAL HIGH (ref 26.0–34.0)
MCV: 99.3 fL (ref 78.0–100.0)
Platelets: 194 10*3/uL (ref 150–400)
RBC: 4.46 MIL/uL (ref 4.22–5.81)
WBC: 17.3 10*3/uL — ABNORMAL HIGH (ref 4.0–10.5)

## 2013-03-08 LAB — URINALYSIS, ROUTINE W REFLEX MICROSCOPIC
Glucose, UA: NEGATIVE mg/dL
Hgb urine dipstick: NEGATIVE
Specific Gravity, Urine: 1.01 (ref 1.005–1.030)

## 2013-03-08 LAB — HEPATIC FUNCTION PANEL
ALT: 244 U/L — ABNORMAL HIGH (ref 0–53)
Albumin: 3.6 g/dL (ref 3.5–5.2)
Alkaline Phosphatase: 109 U/L (ref 39–117)
Total Protein: 6.9 g/dL (ref 6.0–8.3)

## 2013-03-08 LAB — CK TOTAL AND CKMB (NOT AT ARMC)
Relative Index: INVALID (ref 0.0–2.5)
Total CK: 38 U/L (ref 7–232)

## 2013-03-08 LAB — POCT I-STAT TROPONIN I: Troponin i, poc: 0.04 ng/mL (ref 0.00–0.08)

## 2013-03-08 MED ORDER — ASPIRIN 81 MG PO TABS: 81.0000 mg | ORAL_TABLET | Freq: Every day | ORAL | Status: DC

## 2013-03-08 MED ORDER — CALCIUM CARBONATE 600 MG PO TABS
600.0000 mg | ORAL_TABLET | Freq: Every day | ORAL | Status: DC
  Filled 2013-03-08: qty 1

## 2013-03-08 MED ORDER — NITROGLYCERIN 0.4 MG SL SUBL: 0.4000 mg | SUBLINGUAL_TABLET | SUBLINGUAL | Status: DC | PRN

## 2013-03-08 MED ORDER — CLOPIDOGREL BISULFATE 75 MG PO TABS
75.0000 mg | ORAL_TABLET | Freq: Every day | ORAL | Status: DC
  Administered 2013-03-08 – 2013-03-14 (×7): 75 mg via ORAL
  Filled 2013-03-08 (×7): qty 1

## 2013-03-08 MED ORDER — ZOLPIDEM TARTRATE 5 MG PO TABS: 5.0000 mg | ORAL_TABLET | Freq: Every evening | ORAL | Status: DC | PRN

## 2013-03-08 MED ORDER — DILTIAZEM HCL 100 MG IV SOLR
10.0000 mg/h | INTRAVENOUS | Status: DC
  Administered 2013-03-08: 15 mg/h via INTRAVENOUS
  Administered 2013-03-09: 10 mg/h via INTRAVENOUS
  Filled 2013-03-08 (×3): qty 100

## 2013-03-08 MED ORDER — ONDANSETRON HCL 4 MG/2ML IJ SOLN: 4.0000 mg | Freq: Four times a day (QID) | INTRAMUSCULAR | Status: DC | PRN

## 2013-03-08 MED ORDER — PANTOPRAZOLE SODIUM 40 MG PO TBEC
40.0000 mg | DELAYED_RELEASE_TABLET | Freq: Every day | ORAL | Status: DC
  Administered 2013-03-08 – 2013-03-14 (×7): 40 mg via ORAL
  Filled 2013-03-08 (×6): qty 1

## 2013-03-08 MED ORDER — DILTIAZEM HCL 25 MG/5ML IV SOLN
20.0000 mg | Freq: Once | INTRAVENOUS | Status: AC
  Administered 2013-03-08: 20 mg via INTRAVENOUS

## 2013-03-08 MED ORDER — POTASSIUM CHLORIDE CRYS ER 20 MEQ PO TBCR
20.0000 meq | EXTENDED_RELEASE_TABLET | Freq: Two times a day (BID) | ORAL | Status: DC
  Administered 2013-03-08 – 2013-03-12 (×10): 20 meq via ORAL
  Filled 2013-03-08 (×13): qty 1

## 2013-03-08 MED ORDER — SODIUM CHLORIDE 0.9 % IV SOLN
INTRAVENOUS | Status: DC
  Administered 2013-03-09: 09:00:00 via INTRAVENOUS
  Administered 2013-03-12: 250 mL via INTRAVENOUS

## 2013-03-08 MED ORDER — CALCIUM CARBONATE 1250 (500 CA) MG PO TABS
1.0000 | ORAL_TABLET | Freq: Every day | ORAL | Status: DC
  Administered 2013-03-08 – 2013-03-14 (×7): 500 mg via ORAL
  Filled 2013-03-08 (×11): qty 1

## 2013-03-08 MED ORDER — FUROSEMIDE 10 MG/ML IJ SOLN
80.0000 mg | Freq: Once | INTRAMUSCULAR | Status: AC
  Administered 2013-03-08: 80 mg via INTRAVENOUS
  Filled 2013-03-08: qty 8

## 2013-03-08 MED ORDER — LEVOTHYROXINE SODIUM 150 MCG PO TABS
150.0000 ug | ORAL_TABLET | Freq: Every day | ORAL | Status: DC
  Administered 2013-03-08 – 2013-03-14 (×7): 150 ug via ORAL
  Filled 2013-03-08 (×9): qty 1

## 2013-03-08 MED ORDER — FUROSEMIDE 10 MG/ML IJ SOLN
80.0000 mg | Freq: Two times a day (BID) | INTRAMUSCULAR | Status: DC
  Administered 2013-03-08 – 2013-03-09 (×2): 80 mg via INTRAVENOUS
  Filled 2013-03-08 (×2): qty 8

## 2013-03-08 MED ORDER — INSULIN ASPART 100 UNIT/ML ~~LOC~~ SOLN
0.0000 [IU] | Freq: Three times a day (TID) | SUBCUTANEOUS | Status: DC
  Administered 2013-03-08: 2 [IU] via SUBCUTANEOUS
  Administered 2013-03-08: 5 [IU] via SUBCUTANEOUS
  Administered 2013-03-09: 3 [IU] via SUBCUTANEOUS
  Administered 2013-03-09 – 2013-03-10 (×3): 1 [IU] via SUBCUTANEOUS
  Administered 2013-03-10: 3 [IU] via SUBCUTANEOUS
  Administered 2013-03-10: 1 [IU] via SUBCUTANEOUS
  Administered 2013-03-11 (×2): 2 [IU] via SUBCUTANEOUS
  Administered 2013-03-11: 1 [IU] via SUBCUTANEOUS
  Administered 2013-03-12 (×2): 2 [IU] via SUBCUTANEOUS
  Administered 2013-03-12 – 2013-03-13 (×2): 1 [IU] via SUBCUTANEOUS
  Administered 2013-03-13: 2 [IU] via SUBCUTANEOUS
  Administered 2013-03-13 – 2013-03-14 (×2): 1 [IU] via SUBCUTANEOUS

## 2013-03-08 MED ORDER — DILTIAZEM HCL 25 MG/5ML IV SOLN
15.0000 mg | Freq: Once | INTRAVENOUS | Status: AC
  Administered 2013-03-08: 15 mg via INTRAVENOUS

## 2013-03-08 MED ORDER — LEVALBUTEROL HCL 1.25 MG/0.5ML IN NEBU
1.2500 mg | INHALATION_SOLUTION | Freq: Once | RESPIRATORY_TRACT | Status: AC
  Administered 2013-03-08: 1.25 mg via RESPIRATORY_TRACT
  Filled 2013-03-08: qty 0.5

## 2013-03-08 MED ORDER — ACETAMINOPHEN ER 650 MG PO TBCR: 650.0000 mg | EXTENDED_RELEASE_TABLET | Freq: Three times a day (TID) | ORAL | Status: DC | PRN

## 2013-03-08 MED ORDER — PREDNISONE 5 MG PO TABS
5.0000 mg | ORAL_TABLET | Freq: Every day | ORAL | Status: DC
  Filled 2013-03-08: qty 1

## 2013-03-08 MED ORDER — ALPRAZOLAM 0.25 MG PO TABS: 0.2500 mg | ORAL_TABLET | Freq: Two times a day (BID) | ORAL | Status: DC | PRN

## 2013-03-08 MED ORDER — HEPARIN (PORCINE) IN NACL 100-0.45 UNIT/ML-% IJ SOLN
1500.0000 [IU]/h | INTRAMUSCULAR | Status: DC
  Administered 2013-03-08: 1300 [IU]/h via INTRAVENOUS
  Administered 2013-03-09 (×2): 1450 [IU]/h via INTRAVENOUS
  Administered 2013-03-09: 1300 [IU]/h via INTRAVENOUS
  Administered 2013-03-10 – 2013-03-11 (×2): 1650 [IU]/h via INTRAVENOUS
  Filled 2013-03-08 (×9): qty 250

## 2013-03-08 MED ORDER — HEPARIN BOLUS VIA INFUSION
2500.0000 [IU] | Freq: Once | INTRAVENOUS | Status: AC
  Administered 2013-03-08: 2500 [IU] via INTRAVENOUS

## 2013-03-08 MED ORDER — METOPROLOL TARTRATE 25 MG PO TABS
25.0000 mg | ORAL_TABLET | Freq: Two times a day (BID) | ORAL | Status: DC
  Administered 2013-03-08 – 2013-03-09 (×3): 25 mg via ORAL
  Filled 2013-03-08 (×4): qty 1

## 2013-03-08 MED ORDER — PREDNISONE 5 MG PO TABS
5.0000 mg | ORAL_TABLET | Freq: Every day | ORAL | Status: DC
  Administered 2013-03-08 – 2013-03-14 (×7): 5 mg via ORAL
  Filled 2013-03-08 (×8): qty 1

## 2013-03-08 MED ORDER — ASPIRIN EC 81 MG PO TBEC
81.0000 mg | DELAYED_RELEASE_TABLET | Freq: Every day | ORAL | Status: DC
  Administered 2013-03-08 – 2013-03-14 (×7): 81 mg via ORAL
  Filled 2013-03-08 (×7): qty 1

## 2013-03-08 MED ORDER — INSULIN ASPART 100 UNIT/ML ~~LOC~~ SOLN: 0.0000 [IU] | Freq: Every day | SUBCUTANEOUS | Status: DC

## 2013-03-08 MED ORDER — FUROSEMIDE 10 MG/ML IJ SOLN: 80.0000 mg | Freq: Two times a day (BID) | INTRAMUSCULAR | Status: DC

## 2013-03-08 MED ORDER — ASPIRIN EC 81 MG PO TBEC: 81.0000 mg | DELAYED_RELEASE_TABLET | Freq: Every day | ORAL | Status: DC

## 2013-03-08 MED ORDER — DILTIAZEM HCL 100 MG IV SOLR
5.0000 mg/h | Freq: Once | INTRAVENOUS | Status: AC
  Administered 2013-03-08: 5 mg/h via INTRAVENOUS

## 2013-03-08 MED ORDER — OMEGA-3-ACID ETHYL ESTERS 1 G PO CAPS
1.0000 g | ORAL_CAPSULE | Freq: Two times a day (BID) | ORAL | Status: DC
  Administered 2013-03-08 – 2013-03-14 (×13): 1 g via ORAL
  Filled 2013-03-08 (×15): qty 1

## 2013-03-08 MED ORDER — ACETAMINOPHEN 325 MG PO TABS: 650.0000 mg | ORAL_TABLET | ORAL | Status: DC | PRN

## 2013-03-08 MED ORDER — ISOSORBIDE MONONITRATE ER 60 MG PO TB24
60.0000 mg | ORAL_TABLET | Freq: Every day | ORAL | Status: DC
  Administered 2013-03-08 – 2013-03-14 (×7): 60 mg via ORAL
  Filled 2013-03-08 (×7): qty 1

## 2013-03-08 MED ORDER — DILTIAZEM HCL 100 MG IV SOLR
10.0000 mg/h | Freq: Once | INTRAVENOUS | Status: AC
  Administered 2013-03-08: 10 mg/h via INTRAVENOUS
  Filled 2013-03-08: qty 100

## 2013-03-08 MED ORDER — ACETAMINOPHEN 325 MG PO TABS: 650.0000 mg | ORAL_TABLET | Freq: Three times a day (TID) | ORAL | Status: DC | PRN

## 2013-03-08 NOTE — Progress Notes (Signed)
ANTICOAGULATION CONSULT NOTE - Initial Consult  Pharmacy Consult for Heparin Indication: chest pain/ACS and atrial fibrillation  Allergies  Allergen Reactions  . Clarithromycin Other (See Comments)    Unknown, thinks "blisters in mouth"  . Influenza Vaccines Other (See Comments)    Severe coughing for weeks after vaccine; also had flu like symptoms for about 7 to 8 weeks.   . Lisinopril Other (See Comments)    cough  . Ranexa (Ranolazine Er)   . Statins     Patient Measurements:   Heparin Dosing Weight: 94 kg  Vital Signs: Temp: 97.9 F (36.6 C) (03/26 0537) Temp src: Oral (03/26 0537) BP: 135/77 mmHg (03/26 0900) Pulse Rate: 95 (03/26 0703)  Labs:  Recent Labs  03/08/13 0529 03/08/13 0821  HGB 15.2  --   HCT 44.3  --   PLT 194  --   LABPROT 18.1*  --   INR 1.55*  --   CREATININE 1.07  --   CKTOTAL  --  38  CKMB  --  3.0  TROPONINI  --  <0.30    The CrCl is unknown because both a height and weight (above a minimum accepted value) are required for this calculation.   Medical History: Past Medical History  Diagnosis Date  . Pleuritis     h/o------ with negative vats biopsy  . Acute myocardial infarction, unspecified site, episode of care unspecified   . Hypertension   . Other and unspecified hyperlipidemia   . Coronary artery disease   . A-fib     persistant  . Chronic ischemic heart disease, unspecified   . Diabetes mellitus   . 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 Chronic anticoagulation, on coumadin 01/12/2013  . Cardiomyopathy, ischemic EF 25% 01/12/2013    now improved to 40-45% 01/2013  . Anginal pain   . Hypothyroidism     Medications:  Takes Coumadin 7.5mg  Tue-Thurs-Sat-Sun; 5mg  Mon-Wed-Fri. Last dose taken 3/25.  Assessment: 72 year old male admitted with SOB and CP in the setting of known CAD, pulmonary issues, and atrial fibrillation.  He is on chronic anticoagulation with Coumadin and his INR is  subtherapeutic.  To begin Heparin bridging for r/o ACS, atrial fibrillation.  Goal of Therapy:  Heparin level 0.3-0.7 units/ml Monitor platelets by anticoagulation protocol: Yes   Plan:  Heparin 2500 units IV bolus x 1 Start Heparin infusion at 1300 units/hr (~14 units/kg/hr) Check Heparin level in 8 hours Daily Heparin level and CBC  Estella Husk, Pharm.D., BCPS Clinical Pharmacist Phone: [phone] or [phone] Pager: [phone] 03/08/2013, 9:55 AM

## 2013-03-08 NOTE — ED Notes (Signed)
Heparin verified by Dahlia Byes RN

## 2013-03-08 NOTE — Progress Notes (Signed)
Condom cath applied per tech Kulm.

## 2013-03-08 NOTE — ED Notes (Signed)
Patient presents with c/o shortness of breath and left sided chest pain. Onset yesterday afternoon and has progressively gotten worse. No fevers, sweats, chills, headaches, dizziness,N/V or abdominal pain. Patient noted to be in A-fib with HR 140s. Has a hx of the same and is on coumadin

## 2013-03-08 NOTE — ED Provider Notes (Addendum)
History     CSN: [id number]  Arrival date & time 03/08/13  8295   First MD Initiated Contact with Patient 03/08/13 [phone]      Chief Complaint  Patient presents with  . Chest Pain    (Consider location/radiation/quality/duration/timing/severity/associated sxs/prior treatment) Patient is a 72 y.o. male presenting with chest pain. The history is provided by the patient.  Chest Pain Pain location:  Substernal area and L chest Pain quality: aching   Pain radiates to:  Does not radiate Pain radiates to the back: no   Pain severity:  Mild Onset quality:  Gradual Duration:  4 hours Timing:  Constant Progression:  Worsening Chronicity:  Recurrent Context: breathing   Relieved by:  Nothing Worsened by:  Nothing tried Ineffective treatments:  None tried Associated symptoms: shortness of breath   Risk factors: coronary artery disease and hypertension     Past Medical History  Diagnosis Date  . Pleuritis     h/o------ with negative vats biopsy  . Acute myocardial infarction, unspecified site, episode of care unspecified   . Hypertension   . Other and unspecified hyperlipidemia   . Coronary artery disease   . A-fib     persistant  . Chronic ischemic heart disease, unspecified   . Diabetes mellitus   . 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 Chronic anticoagulation, on coumadin 01/12/2013  . Cardiomyopathy, ischemic EF 25% 01/12/2013    Past Surgical History  Procedure Laterality Date  . Bypass graft    . Lung surgery    . Knee surgery    . Carpal tunnel release    . Cataract extraction    . Cardiac catheterization    . Coronary artery bypass graft      Family History  Problem Relation Age of Onset  . Heart disease Mother   . Heart disease Father   . Heart disease Brother   . Heart disease Maternal Aunt   . Cancer      History  Substance Use Topics  . Smoking status: Never Smoker   . Smokeless tobacco: Never Used  .  Alcohol Use: No      Review of Systems  Respiratory: Positive for shortness of breath.   Cardiovascular: Positive for chest pain.  All other systems reviewed and are negative.    Allergies  Clarithromycin; Influenza vaccines; Lisinopril; and Ranexa  Home Medications   Current Outpatient Rx  Name  Route  Sig  Dispense  Refill  . acetaminophen (TYLENOL) 650 MG CR tablet   Oral   Take 650 mg by mouth every 8 (eight) hours as needed. For pain         . aspirin 81 MG tablet   Oral   Take 81 mg by mouth daily.         [name] atorvastatin (LIPITOR) 40 MG tablet   Oral   Take 40 mg by mouth daily.         . calcium carbonate (OS-CAL) 600 MG TABS   Oral   Take 600 mg by mouth daily.         . clopidogrel (PLAVIX) 75 MG tablet   Oral   Take 75 mg by mouth daily.         [name] diltiazem (DILACOR XR) 180 MG 24 hr capsule   Oral   Take 2 capsules (360 mg total) by mouth daily.   30 capsule   5   . ezetimibe (ZETIA) 10 MG tablet  Oral   Take 10 mg by mouth daily.         . fluticasone (FLONASE) 50 MCG/ACT nasal spray   Each Nare   Place 2 sprays into both nostrils daily.          . furosemide (LASIX) 40 MG tablet   Oral   Take 40 mg by mouth daily.         . isosorbide mononitrate (IMDUR) 60 MG 24 hr tablet   Oral   Take 2 tablets (120 mg total) by mouth daily.   60 tablet   5   . leflunomide (ARAVA) 20 MG tablet   Oral   Take 20 mg by mouth daily.         [name] levothyroxine (SYNTHROID, LEVOTHROID) 175 MCG tablet   Oral   Take 175 mcg by mouth daily.         . metoprolol tartrate (LOPRESSOR) 25 MG tablet   Oral   Take 25 mg by mouth 2 (two) times daily.         . nitroGLYCERIN (NITROSTAT) 0.4 MG SL tablet   Sublingual   Place 0.4 mg under the tongue every 5 (five) minutes as needed.         . Omega-3 Fatty Acids (FISH OIL) 1000 MG CAPS   Oral   Take 3,000 mg by mouth every morning.         . Omeprazole (PRILOSEC PO)   Oral   Take 1  tablet by mouth every morning.         . predniSONE (DELTASONE) 5 MG tablet      Take 40mg  a day X 2 days starting Monday - then 30mg  a day X 3 days, then 20mg  a day X 3 days, then 10mg  a day X 3 days, then resume your usual dose of 5mg  daily.   52 tablet   0   . Saxagliptin-Metformin (KOMBIGLYZE XR) 2.04-999 MG TB24   Oral   Take 1 tablet by mouth 2 (two) times daily.         [name] warfarin (COUMADIN) 5 MG tablet   Oral   Take 5 mg by mouth daily. Takes 7.5 mg-Sun,Tues,Wed,Thurs,Sat Takes 5 mg-Mon, Wed           There were no vitals taken for this visit.  Physical Exam  Constitutional: He is oriented to person, place, and time. He appears well-developed and well-nourished.  HENT:  Head: Normocephalic and atraumatic.  Eyes: Conjunctivae are normal. Pupils are equal, round, and reactive to light.  Neck: Normal range of motion. Neck supple.  Cardiovascular: Normal heart sounds and intact distal pulses.  An irregularly irregular rhythm present. Tachycardia present.   Pulmonary/Chest: Effort normal and breath sounds normal.  Abdominal: Soft. Bowel sounds are normal.  Neurological: He is alert and oriented to person, place, and time.  Skin: Skin is warm and dry.  Psychiatric: He has a normal mood and affect. His behavior is normal. Judgment and thought content normal.    ED Course  Procedures (including critical care time)  Labs Reviewed  CBC  BASIC METABOLIC PANEL  PRO B NATRIURETIC PEPTIDE   No results found.   No diagnosis found.   Date: 03/08/2013  Rate: 170  Rhythm: atrial fibrillation  QRS Axis: left  Intervals: normal  ST/T Wave abnormalities: nonspecific ST changes  Conduction Disutrbances:left bundle branch block  Narrative Interpretation:   Old EKG Reviewed: changes noted CRITICAL CARE Performed by: Rosanne Ashing   Total  critical care time:  Critical care time was exclusive of separately billable procedures and treating other  patients.  Critical care was necessary to treat or prevent imminent or life-threatening deterioration.  Critical care was time spent personally by me on the following activities: development of treatment plan with patient and/or surrogate as well as nursing, discussions with consultants, evaluation of patient's response to treatment, examination of patient, obtaining history from patient or surrogate, ordering and performing treatments and interventions, ordering and review of laboratory studies, ordering and review of radiographic studies, pulse oximetry and re-evaluation of patient's condition.  MDM  + afib with rvr,  Dyspnea,  Hx of copd, cad.  Will chemically rate control vs cardiversion.  Remains normotensive and alert, reassess  Improved.  Placed on cardizem drip.  Discussed with [place] on for dr Fredric Mare.  Will admit      Rosanne Ashing, MD 03/08/13 1610  Rosanne Ashing, MD 03/08/13 0800

## 2013-03-08 NOTE — Progress Notes (Signed)
Utilization Review Completed. 03/08/2013  

## 2013-03-08 NOTE — ED Notes (Signed)
Patient asleep at this time. No acute distress noted, resp are even and unlabored. Family members at bedside. Call light at bedside. Will continue to monitor.

## 2013-03-08 NOTE — Progress Notes (Signed)
CRITICAL VALUE ALERT  Critical value received: troponin 0.5  Date of notification:  03/08/13  Time of notification:  1415  Critical value read back:yes  Nurse who received alert:Treysen Sudbeck Cliffton Asters, RN  MD notified (1st page):  Nada Boozer, NP  Time of first page:  1417  MD notified (2nd page) Nada Boozer, NP  Time of second page:1455  Responding JX:BJYNW Annie Paras, NP  Time MD responded: [phone]

## 2013-03-08 NOTE — ED Notes (Signed)
EDP updated on pts HR and BP, 20mg  bolus to be given

## 2013-03-08 NOTE — ED Notes (Signed)
Pharmacy in room  

## 2013-03-08 NOTE — Progress Notes (Signed)
ANTICOAGULATION CONSULT NOTE - Follow Up Consult  Pharmacy Consult for Heparin Indication: chest pain/ACS and atrial fibrillation  Allergies  Allergen Reactions  . Clarithromycin Other (See Comments)    Unknown, thinks "blisters in mouth"  . Influenza Vaccines Other (See Comments)    Severe coughing for weeks after vaccine; also had flu like symptoms for about 7 to 8 weeks.   . Lisinopril Other (See Comments)    cough  . Ranexa (Ranolazine Er)   . Statins     Patient Measurements: Height: 6' (182.9 cm) Weight: 203 lb 7.8 oz (92.3 kg) IBW/kg (Calculated) : 77.6 Heparin Dosing Weight: 92 Kg  Vital Signs: Temp: 97.9 F (36.6 C) (03/26 1335) Temp src: Oral (03/26 1335) BP: 126/91 mmHg (03/26 1200) Pulse Rate: 101 (03/26 1200)  Labs:  Recent Labs  03/08/13 0529 03/08/13 0821 03/08/13 1324 03/08/13 1841  HGB 15.2  --   --   --   HCT 44.3  --   --   --   PLT 194  --   --   --   LABPROT 18.1*  --   --   --   INR 1.55*  --   --   --   HEPARINUNFRC  --   --   --  0.39  CREATININE 1.07  --  1.13  --   CKTOTAL  --  38  --   --   CKMB  --  3.0  --   --   TROPONINI  --  <0.30 0.52*  --     Estimated Creatinine Clearance: 63.9 ml/min (by C-G formula based on Cr of 1.13).  Assessment: Mr. Larouche continues on heparin infusion for CP in the setting of known CAD, pulmonary issues, and atrial fibrillation. Heparin level is therapeutic, no complications reported. Noted troponin earlier today was elevated.  Goal of Therapy:  Heparin level 0.3-0.7 units/ml Monitor platelets by anticoagulation protocol: Yes   Plan:  - Continue heparin drip at 1300 units/hr - Will recheck heparin level at 0500 3/27 to confirm still therapeutic  Thanks, Capitola Ladson K. Allena Katz, PharmD, BCPS.  Clinical Pharmacist Pager [phone]. 03/08/2013 7:26 PM

## 2013-03-08 NOTE — Progress Notes (Signed)
Advised Nada Boozer of trop 0.5. No new orders given.

## 2013-03-08 NOTE — H&P (Signed)
[name] is an 72 y.o. male.    Chief Complaint: chest pain, SOB  HPI:  72 year old mildly overweight married Caucasian male father of 3, grandfather to 6 grandchildren. 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 He had a LIMA to his LAD and diagonal branch sequentially, a vein to the 1st and 2nd marginal branches sequentially and to the acute marginal branch and PDA. He has moderate LV dysfunction, chronic atrial fibrillation, rate controlled on Coumadin anticoagulation. He was referred to Dr. Hillis Range for consideration of atrial fibrillation ablation but he thought he was not a good candidate for this. His major complaints are of chronic shortness of breath and chest pain which is nitro-responsive. 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 He does have a chronic dyspnea and a known paralyzed left hemidiaphragm. He was cathed April 03, 2011 revealing a patent vein to an OM, a patent sequential LIMA to LAD and D1, and a vein to the PDA which had a 75% insertion stenosis and a 95% stenosis in the PDA beyond vein graft insertion. He also had a 95% AV groove stenosis which supplied a small posterolateral branch. His cath EF was 20-25%, although by 2D one month prior it was 45%. He has taken Ranexa in the past but says it didn't help his angina.  In Feb. 2014 he was admitted with chest pain and underwent cardiac cath revealing notable progression in SVG-PDA with disease in the native vessel after the graft. It was felt this was not amenable to PCI and the plan is for medical Rx. His medications were adjusted and Dr Royann Shivers suggested we try Ranexa 500mg  daily- if he has recurrent symptoms. His EF was noted to be depressed by cath 40-45%, by echo 45-50%-  but in reviewing his records its noted that his EF has been labile- from  55%-25%. He has severe lung disease and Dr Royann Shivers did not feel he was a candidate for an ICD.   He now presents with chest pain, SOB and his chronic A. Fib now with RVR.  Pt had been doing really well since last office visit, ambulating, though he has had 2 falls recently for weakness and unsteadiness.  Yesterday AM he developed acute SOB at 0100 AM, and waited it out with improvement during the day.  Then this AM again awakened from sleep around 0100 with acute SOB, then developed significant chest pain.  He took total of 3 NTG sl and after 15-20 min. the pain and SOB improved somewhat.    But still present so his family brought him to the ER.  His HR was 170's and CXR reveals CHF.  He has rec'd 15 mg IV cardizem, now on drip at 10 with HR of 108 unless he is active to void then still climbs to 150.  He has also rec'd 80 mg IV lasix.  Pt's lasix was stopped 01/30/13 due to hypotension and weakness.  BP at that time was in the 80's.  His family reports that his wt. Has been stable though he has continued to loose weight with poor appetite.    Past Medical History  Diagnosis Date  . Pleuritis     h/o------ with negative vats biopsy  . Acute myocardial infarction, unspecified site, episode of care unspecified   . Hypertension   . Other  and unspecified hyperlipidemia   . Coronary artery disease   . A-fib     persistant  . Chronic ischemic heart disease, unspecified   . Diabetes mellitus   . 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 Chronic anticoagulation, on coumadin 01/12/2013  . Cardiomyopathy, ischemic EF 25% 01/12/2013    Past Surgical History  Procedure Laterality Date  . Bypass graft    . Lung surgery    . Knee surgery    . Carpal tunnel release    . Cataract extraction    . Cardiac catheterization    . Coronary artery bypass graft      Family History  Problem Relation Age of Onset  . Heart disease Mother   . Heart disease Father   .  Heart disease Brother   . Heart disease Maternal Aunt   . Cancer     Social History:  reports that he has never smoked. He has never used smokeless tobacco. He reports that he does not drink alcohol or use illicit drugs.  Allergies:  Allergies  Allergen Reactions  . Clarithromycin Other (See Comments)    Unknown, thinks "blisters in mouth"  . Influenza Vaccines Other (See Comments)    Severe coughing for weeks after vaccine; also had flu like symptoms for about 7 to 8 weeks.   . Lisinopril Other (See Comments)    cough  . Ranexa (Ranolazine Er)     OUTPATIENT MEDICATIONS: Fish oil 3 grams daily Imdur 60 mg daily ASA 81 mg daily Coumadin dose just decreased due to INR of 4, on 5 mg MWF, 7.5 mg other days. Diltiazem 180 mg BID Levothyroxine daily Plavix 75 mg daily Prednisone 5 mg daily Konbiglyze 2.5/1 gram BID Calcium daily Loratadine 10 mg daily Metoprolol tartrate 25 mg BID Omeprazole 20 mg daily   Results for orders placed during the hospital encounter of 03/08/13 (from the past 48 hour(s))  CBC     Status: Abnormal   Collection Time    03/08/13  5:29 AM      Result Value Range   WBC 17.3 (*) 4.0 - 10.5 K/uL   RBC 4.46  4.22 - 5.81 MIL/uL   Hemoglobin 15.2  13.0 - 17.0 g/dL   HCT 16.1  09.6 - 04.5 %   MCV 99.3  78.0 - 100.0 fL   MCH 34.1 (*) 26.0 - 34.0 pg   MCHC 34.3  30.0 - 36.0 g/dL   RDW 40.9 (*) 81.1 - 91.4 %   Platelets 194  150 - 400 K/uL  BASIC METABOLIC PANEL     Status: Abnormal   Collection Time    03/08/13  5:29 AM      Result Value Range   Sodium 133 (*) 135 - 145 mEq/L   Potassium 5.1  3.5 - 5.1 mEq/L   Chloride 101  96 - 112 mEq/L   CO2 18 (*) 19 - 32 mEq/L   Glucose, Bld 239 (*) 70 - 99 mg/dL   BUN 23  6 - 23 mg/dL   Creatinine, Ser 7.82  0.50 - 1.35 mg/dL   Calcium 8.9  8.4 - 95.6 mg/dL   GFR calc non Af Amer 67 (*) >90 mL/min   GFR calc Af Amer 77 (*) >90 mL/min   Comment:            The eGFR has been calculated     using the  CKD EPI equation.     This  calculation has not been     validated in all clinical     situations.     eGFR's persistently     <90 mL/min signify     possible Chronic Kidney Disease.  PROTIME-INR     Status: Abnormal   Collection Time    03/08/13  5:29 AM      Result Value Range   Prothrombin Time 18.1 (*) 11.6 - 15.2 seconds   INR 1.55 (*) 0.00 - 1.49  PRO B NATRIURETIC PEPTIDE     Status: Abnormal   Collection Time    03/08/13  5:30 AM      Result Value Range   Pro B Natriuretic peptide (BNP) 12639.0 (*) 0 - 125 pg/mL  POCT I-STAT TROPONIN I     Status: None   Collection Time    03/08/13  5:59 AM      Result Value Range   Troponin i, poc 0.04  0.00 - 0.08 ng/mL   Comment 3            Comment: Due to the release kinetics of cTnI,     a negative result within the first hours     of the onset of symptoms does not rule out     myocardial infarction with certainty.     If myocardial infarction is still suspected,     repeat the test at appropriate intervals.   Dg Chest Port 1 View  03/08/2013  *RADIOLOGY REPORT*  Clinical Data: Short of breath.  Chest pain.  PORTABLE CHEST - 1 VIEW  Comparison: 01/11/2013.  Findings: Cardiomegaly.  Low volume chest.  Basilar atelectasis. Interstitial pulmonary edema is suspected, superimposed on the basilar atelectasis.  Median sternotomy, likely for CABG.  No focal consolidation.  IMPRESSION:  1.  Low volume chest. 2.  Cardiomegaly with suspected mild interstitial pulmonary edema. Consider repeat PA and lateral chest radiograph with full inspiration when patient condition permits for better assessment of interstitial pulmonary edema.   Original Report Authenticated By: Andreas Newport, M.D.     ROS: General:no colds or fevers, no weight changes- continues to be stable or to loose Skin:no rashes or ulcers, recent bout of shingles, lt side of chest and down lt leg and into groin, healing, dried lesions HEENT:no blurred vision, no congestion CV:see  HPI PUL:see HPI GI:no diarrhea constipation or melena, no indigestion GU:no hematuria, no dysuria MS:+ hip pain- recent steroid injection, no claudication Neuro:no syncope, no lightheadedness Endo:+ diabetes- increase of glucose after steroid injection, + thyroid disease with recent adjustment of levothyroxine.   Blood pressure 126/74, pulse 95, temperature 97.9 F (36.6 C), temperature source Oral, resp. rate 24, SpO2 95.00%. PE: General: alert, obviously SOB, pain resolved, pleasant affect Skin:warm and dry, lt thigh and groin with drying shingles lesions, abrasion rt. Knee from fall HEENT:glasses in place, normocephalic, sclera clear, moist mucus membranes Neck:Supple, + JVD, no carotid bruits Heart:rapid, irreg irreg, no obvious murmur no gallup Lungs:rales 1/3 up bil. No wheezes, no rhonchi NWG:[id number], + BS, non tender Ext:tr edema, 2 + pedal pulses bil, 2+ radial pulses Bil. Neuro:alert and oriented X3, MAE, follows commands, + facial symmetry.     Assessment/Plan Principal Problem:   Acute on chronic systolic HF (heart failure) Active Problems:   Atrial fibrillation, permanent, now wtih RVR at 170   Unstable angina   DYSLIPIDEMIA   CAD, CABG '98, cath 01/14/13- medical Rx   Chronic anticoagulation, on coumadin   Cardiomyopathy, ischemic EF 25% at one time  most recent cath EF 40-45% 01/2013   Diabetes mellitus  PLAN: Agree with IV cardizem, diuresis, rule out MI, IV heparin for now.  If enzymes + may need repeat cardiac cath.  Not sure if this if Afib with RVR resulting in acute CHF and angina vs. Angina causing CHF and RVR.  No wheezes  Currently.  Was to see Pul. As outpatient today if more pul. Issues may need them to see in hospital.  For now treat CHF.  Also pt did receive steroid injection recently and he is on chronic steroids which may explain elevated WBCs.      INGOLD,LAURA R 03/08/2013, 9:16 AM  ATTENDING ATTESTATION:  I have seen and examined the patient along  with Nada Boozer, NP.  I have reviewed the chart, notes and new data.  I agree with her findings, examination & recommendations as noted above.  Brief Description: 72 year old gentleman well-known to [facility]. He is a patient of Dr. [name]. He has history coronary disease status post CABG as well as chronic atrial fibrillation. His thought not be a good candidate for A. fib ablation locally. He also is history of rheumatoid long wrist receiving chronic steroids. He was last admitted back in January and discharged early February of this year where he underwent cardiac catheterization revealed significant disease in the vein at the PDA with PDA lesion as well. This is not thought to be amenable to PCI due to high-risk features. He actually had a relatively well no active angina since discharge with exception of one episode shortly after discharge. However he had a long bout of a significant amount of shingles the LAD and the significantly debilitated over the last month. He was found to be hypotensive and therefore when seen by the Mr. Diona Fanti at our clinic. He had some medication adjustments including holding his Lasix. Unfortunately although he was feeling very well this past weekend, finally EEG in moving about more as shingles 1 off. Yesterday he started noticing more shortness of breath and then this morning it progressed to being significantly worse with significant short of breath and followed by chest discomfort. He's had been rapid ventricular rate and for heart failure. He responded very well to IV Lasix and started feeling better now. He received IV diltiazem boluses in place with drip his rate is better controlled now in the high 90s to low 100. Currently he is no longer having chest discomfort which I feel is totally related to a ski existing coronary disease with rapid ventricular rate and increased wall stress. His EF by echo was significantly improved beyond 25% was initially  seen during his hospitalization the last almost 40-45% by cath 40-50% by echo.  His BNP is significant elevated and his lungs initially sounded consistent with pulmonary edema. He has a pulsatile liver and elevated JVD consistent with acute on chronic combined systolic/diastolic heart failure exacerbated by A. fib RVR. [place] whether he had A. fib RVR first or increased fluid retention first. Regardless the metastatic disease has had conization of both which exacerbated each.  Certainly the steroid injections and increase dose of steroids could have contributed to increased volume/fluid retention.  PLAN:  We will admit him for IV diuresis and rate control. His potassium was 5.1% sure after Lasix will be lower than that. Once we confirms that in the afternoon of the week would use a digoxin load for additional rate control and a assistance. I suspect he'll need a few days of IV Lasix  and adjustment of his diltiazem dose. Apparently he there was a reason for not being Ranexa which a live-in not having active anginal symptoms I think is reasonable. Ultimately using the medication regimen titrated such that he is probably on standing dose of Lasix to be a small dose with a weight and symptom-based titration scale.  Marykay Lex, M.D., M.S. [facility] [address] [place], [place]  [address]  [phone]  03/08/2013 10:24 AM

## 2013-03-09 LAB — GLUCOSE, CAPILLARY
Glucose-Capillary: 136 mg/dL — ABNORMAL HIGH (ref 70–99)
Glucose-Capillary: 202 mg/dL — ABNORMAL HIGH (ref 70–99)

## 2013-03-09 LAB — BASIC METABOLIC PANEL
BUN: 29 mg/dL — ABNORMAL HIGH (ref 6–23)
CO2: 26 mEq/L (ref 19–32)
Chloride: 97 mEq/L (ref 96–112)
Creatinine, Ser: 1.22 mg/dL (ref 0.50–1.35)
Glucose, Bld: 146 mg/dL — ABNORMAL HIGH (ref 70–99)

## 2013-03-09 LAB — LIPID PANEL
Cholesterol: 197 mg/dL (ref 0–200)
HDL: 42 mg/dL (ref >39)
Triglycerides: 198 mg/dL — ABNORMAL HIGH (ref <150)
VLDL: 40 mg/dL (ref 0–40)

## 2013-03-09 LAB — HEPARIN LEVEL (UNFRACTIONATED): Heparin Unfractionated: 0.28 IU/mL — ABNORMAL LOW (ref 0.30–0.70)

## 2013-03-09 LAB — CBC
HCT: 41 % (ref 39.0–52.0)
Hemoglobin: 14.1 g/dL (ref 13.0–17.0)
MCV: 96 fL (ref 78.0–100.0)
RBC: 4.27 MIL/uL (ref 4.22–5.81)
WBC: 9.6 10*3/uL (ref 4.0–10.5)

## 2013-03-09 MED ORDER — BIOTENE DRY MOUTH MT LIQD
15.0000 mL | Freq: Two times a day (BID) | OROMUCOSAL | Status: DC
  Administered 2013-03-09 – 2013-03-14 (×8): 15 mL via OROMUCOSAL

## 2013-03-09 MED ORDER — WARFARIN SODIUM 7.5 MG PO TABS
7.5000 mg | ORAL_TABLET | Freq: Once | ORAL | Status: AC
  Administered 2013-03-09: 7.5 mg via ORAL
  Filled 2013-03-09: qty 1

## 2013-03-09 MED ORDER — METOPROLOL TARTRATE 25 MG PO TABS
37.5000 mg | ORAL_TABLET | Freq: Two times a day (BID) | ORAL | Status: DC
  Administered 2013-03-09 – 2013-03-13 (×8): 37.5 mg via ORAL
  Filled 2013-03-09 (×10): qty 1

## 2013-03-09 MED ORDER — DILTIAZEM HCL ER COATED BEADS 240 MG PO CP24
240.0000 mg | ORAL_CAPSULE | Freq: Every day | ORAL | Status: DC
  Administered 2013-03-09 – 2013-03-10 (×2): 240 mg via ORAL
  Filled 2013-03-09 (×2): qty 1

## 2013-03-09 MED ORDER — DILTIAZEM HCL ER COATED BEADS 120 MG PO TB24
240.0000 mg | ORAL_TABLET | Freq: Every day | ORAL | Status: DC
  Filled 2013-03-09: qty 2

## 2013-03-09 MED ORDER — FUROSEMIDE 10 MG/ML IJ SOLN
40.0000 mg | Freq: Two times a day (BID) | INTRAMUSCULAR | Status: DC
  Administered 2013-03-09 – 2013-03-10 (×2): 40 mg via INTRAVENOUS
  Filled 2013-03-09 (×4): qty 4

## 2013-03-09 MED ORDER — WARFARIN - PHARMACIST DOSING INPATIENT: Freq: Every day | Status: DC

## 2013-03-09 NOTE — Progress Notes (Addendum)
Pt had a 16 beat run of Vtach at 1205, pt resting in bed talking with family, VVS, asymptomatic. Paged Dr Herbie Baltimore, no response, noted Dr Rennis Golden was rounding MD this am. Dr Rennis Golden paged. Await return call.

## 2013-03-09 NOTE — Progress Notes (Signed)
I agree with above note by Kristopher Glee, PharmD candidate. Celedonio Miyamoto, PharmD, BCPS Clinical Pharmacist Pager [phone]

## 2013-03-09 NOTE — Progress Notes (Signed)
No return call from pages to Dr Rennis Golden earlier. Pt has had another run of Vtach 15 beats at 1411, was observed by [employer] and reported to be asymptomatic.  Paged Corine Shelter, [place] this time at 1435. No answer, paged Bryan Hager,PA at 1440. Call returned and informed of runs of Vtach, asymptomatic with both, current still in afib with a rate of 93. PA made changes in metoprolol. Requested to call MD/PA if >16 beats Vtach and symptomatic.

## 2013-03-09 NOTE — Progress Notes (Signed)
[facility]  DAILY PROGRESS NOTE   Subjective:  Troponin elevated overnight - see last cath report, not a candidate for further intervention. A-fib is better controlled with ventricular response in the 70's today. Labs show a rising creatinine with diuresis. INR is 1.55, he appears subtherapeutic on warfarin.  Troponin <0.3, 0.52, 0.73 -> consistent with small NSTEMI. Leukocytosis present on admission has resolved.  Objective:  Temp:  [97.8 F (36.6 C)-98.4 F (36.9 C)] 98.4 F (36.9 C) (03/27 0755) Pulse Rate:  [65-102] 70 (03/27 0755) Resp:  [14-30] 21 (03/27 0755) BP: (107-142)/(72-91) 130/85 mmHg (03/27 0755) SpO2:  [96 %-100 %] 100 % (03/27 0755) FiO2 (%):  [99 %-100 %] 100 % (03/27 0755) Weight:  [92.3 kg (203 lb 7.8 oz)] 92.3 kg (203 lb 7.8 oz) (03/27 0442) Weight change:   Intake/Output from previous day: 03/26 0701 - 03/27 0700 In: 794.5 [P.O.:120; I.V.:674.5] Out: 5855 [Urine:5855]  Intake/Output from this shift:    Medications: Current Facility-Administered Medications  Medication Dose Route Frequency Provider Last Rate Last Dose  . 0.9 %  sodium chloride infusion   Intravenous Continuous Nada Boozer, NP 10 mL/hr at 03/09/13 [phone]    . acetaminophen (TYLENOL) tablet 650 mg  650 mg Oral Q4H PRN Nada Boozer, NP      . acetaminophen (TYLENOL) tablet 650 mg  650 mg Oral Q8H PRN Marykay Lex, MD      . ALPRAZolam Prudy Feeler) tablet 0.25 mg  0.25 mg Oral BID PRN Nada Boozer, NP      . aspirin EC tablet 81 mg  81 mg Oral Daily Marykay Lex, MD   81 mg at 03/08/13 1405  . calcium carbonate (OS-CAL - dosed in mg of elemental calcium) tablet 500 mg of elemental calcium  1 tablet Oral Q breakfast Marykay Lex, MD   500 mg of elemental calcium at 03/09/13 0821  . clopidogrel (PLAVIX) tablet 75 mg  75 mg Oral Daily Nada Boozer, NP   75 mg at 03/08/13 1348  . diltiazem (CARDIZEM) 100 mg in dextrose 5 % 100 mL infusion  10 mg/hr Intravenous  Titrated Nada Boozer, NP 10 mL/hr at 03/09/13 0700 10 mg/hr at 03/09/13 0700  . furosemide (LASIX) injection 80 mg  80 mg Intravenous BID Marykay Lex, MD   80 mg at 03/09/13 0820  . heparin ADULT infusion 100 units/mL (25000 units/250 mL)  1,300 Units/hr Intravenous Continuous Madolyn Frieze, RPH 13 mL/hr at 03/09/13 0700 1,300 Units/hr at 03/09/13 0700  . insulin aspart (novoLOG) injection 0-5 Units  0-5 Units Subcutaneous QHS Nada Boozer, NP      . insulin aspart (novoLOG) injection 0-9 Units  0-9 Units Subcutaneous TID WC Nada Boozer, NP   2 Units at 03/08/13 1758  . isosorbide mononitrate (IMDUR) 24 hr tablet 60 mg  60 mg Oral Daily Nada Boozer, NP   60 mg at 03/08/13 1345  . levothyroxine (SYNTHROID, LEVOTHROID) tablet 150 mcg  150 mcg Oral QAC breakfast Nada Boozer, NP   150 mcg at 03/09/13 0820  . metoprolol tartrate (LOPRESSOR) tablet 25 mg  25 mg Oral BID Nada Boozer, NP   25 mg at 03/08/13 2319  . nitroGLYCERIN (NITROSTAT) SL tablet 0.4 mg  0.4 mg Sublingual Q5 min PRN Nada Boozer, NP      . nitroGLYCERIN (NITROSTAT) SL tablet 0.4 mg  0.4 mg Sublingual Q5 Min x 3 PRN Nada Boozer, NP      . omega-3 acid  ethyl esters (LOVAZA) capsule 1 g  1 g Oral BID Nada Boozer, NP   1 g at 03/08/13 2319  . ondansetron (ZOFRAN) injection 4 mg  4 mg Intravenous Q6H PRN Nada Boozer, NP      . pantoprazole (PROTONIX) EC tablet 40 mg  40 mg Oral Q1200 Nada Boozer, NP   40 mg at 03/09/13 0820  . potassium chloride SA (K-DUR,KLOR-CON) CR tablet 20 mEq  20 mEq Oral BID Nada Boozer, NP   20 mEq at 03/09/13 4098  . predniSONE (DELTASONE) tablet 5 mg  5 mg Oral Q breakfast Nada Boozer, NP   5 mg at 03/08/13 1345  . zolpidem (AMBIEN) tablet 5 mg  5 mg Oral QHS PRN Nada Boozer, NP        Physical Exam: General appearance: alert and no distress Neck: no adenopathy, no carotid bruit, no JVD, supple, symmetrical, trachea midline and thyroid not enlarged, symmetric, no  tenderness/mass/nodules Lungs: rales LLL Heart: irregularly irregular rhythm Abdomen: soft, non-tender; bowel sounds normal; no masses,  no organomegaly Extremities: extremities normal, atraumatic, no cyanosis or edema Pulses: 2+ and symmetric Skin: pale, warm, dry Neurologic: Grossly normal  Lab Results: Results for orders placed during the hospital encounter of 03/08/13 (from the past 48 hour(s))  CBC     Status: Abnormal   Collection Time    03/08/13  5:29 AM      Result Value Range   WBC 17.3 (*) 4.0 - 10.5 K/uL   RBC 4.46  4.22 - 5.81 MIL/uL   Hemoglobin 15.2  13.0 - 17.0 g/dL   HCT 11.9  14.7 - 82.9 %   MCV 99.3  78.0 - 100.0 fL   MCH 34.1 (*) 26.0 - 34.0 pg   MCHC 34.3  30.0 - 36.0 g/dL   RDW 56.2 (*) 13.0 - 86.5 %   Platelets 194  150 - 400 K/uL  BASIC METABOLIC PANEL     Status: Abnormal   Collection Time    03/08/13  5:29 AM      Result Value Range   Sodium 133 (*) 135 - 145 mEq/L   Potassium 5.1  3.5 - 5.1 mEq/L   Chloride 101  96 - 112 mEq/L   CO2 18 (*) 19 - 32 mEq/L   Glucose, Bld 239 (*) 70 - 99 mg/dL   BUN 23  6 - 23 mg/dL   Creatinine, Ser 7.84  0.50 - 1.35 mg/dL   Calcium 8.9  8.4 - 69.6 mg/dL   GFR calc non Af Amer 67 (*) >90 mL/min   GFR calc Af Amer 77 (*) >90 mL/min   Comment:            The eGFR has been calculated     using the CKD EPI equation.     This calculation has not been     validated in all clinical     situations.     eGFR's persistently     <90 mL/min signify     possible Chronic Kidney Disease.  PROTIME-INR     Status: Abnormal   Collection Time    03/08/13  5:29 AM      Result Value Range   Prothrombin Time 18.1 (*) 11.6 - 15.2 seconds   INR 1.55 (*) 0.00 - 1.49  PRO B NATRIURETIC PEPTIDE     Status: Abnormal   Collection Time    03/08/13  5:30 AM      Result Value Range   Pro B  Natriuretic peptide (BNP) 12639.0 (*) 0 - 125 pg/mL  CULTURE, BLOOD (ROUTINE X 2)     Status: None   Collection Time    03/08/13  5:40 AM       Result Value Range   Specimen Description BLOOD ARM RIGHT     Special Requests BOTTLES DRAWN AEROBIC ONLY 10CC     Culture  Setup Time 03/08/2013 14:24     Culture       Value:        BLOOD CULTURE RECEIVED NO GROWTH TO DATE CULTURE WILL BE HELD FOR 5 DAYS BEFORE ISSUING A FINAL NEGATIVE REPORT   Report Status PENDING    CULTURE, BLOOD (ROUTINE X 2)     Status: None   Collection Time    03/08/13  5:42 AM      Result Value Range   Specimen Description BLOOD HAND RIGHT     Special Requests BOTTLES DRAWN AEROBIC ONLY 10CC     Culture  Setup Time 03/08/2013 14:25     Culture       Value:        BLOOD CULTURE RECEIVED NO GROWTH TO DATE CULTURE WILL BE HELD FOR 5 DAYS BEFORE ISSUING A FINAL NEGATIVE REPORT   Report Status PENDING    POCT I-STAT TROPONIN I     Status: None   Collection Time    03/08/13  5:59 AM      Result Value Range   Troponin i, poc 0.04  0.00 - 0.08 ng/mL   Comment 3            Comment: Due to the release kinetics of cTnI,     a negative result within the first hours     of the onset of symptoms does not rule out     myocardial infarction with certainty.     If myocardial infarction is still suspected,     repeat the test at appropriate intervals.  CK TOTAL AND CKMB     Status: None   Collection Time    03/08/13  8:21 AM      Result Value Range   Total CK 38  7 - 232 U/L   CK, MB 3.0  0.3 - 4.0 ng/mL   Relative Index RELATIVE INDEX IS INVALID  0.0 - 2.5   Comment: WHEN CK < 100 U/L             TROPONIN I     Status: None   Collection Time    03/08/13  8:21 AM      Result Value Range   Troponin I <0.30  <0.30 ng/mL   Comment:            Due to the release kinetics of cTnI,     a negative result within the first hours     of the onset of symptoms does not rule out     myocardial infarction with certainty.     If myocardial infarction is still suspected,     repeat the test at appropriate intervals.  HEPATIC FUNCTION PANEL     Status: Abnormal    Collection Time    03/08/13  8:21 AM      Result Value Range   Total Protein 6.9  6.0 - 8.3 g/dL   Albumin 3.6  3.5 - 5.2 g/dL   AST 84 (*) 0 - 37 U/L   ALT 244 (*) 0 - 53 U/L   Alkaline Phosphatase 109  39 -  117 U/L   Total Bilirubin 1.2  0.3 - 1.2 mg/dL   Bilirubin, Direct 0.5 (*) 0.0 - 0.3 mg/dL   Indirect Bilirubin 0.7  0.3 - 0.9 mg/dL  MAGNESIUM     Status: None   Collection Time    03/08/13  8:21 AM      Result Value Range   Magnesium 1.9  1.5 - 2.5 mg/dL  TSH     Status: None   Collection Time    03/08/13  8:21 AM      Result Value Range   TSH 0.832  0.350 - 4.500 uIU/mL  URINALYSIS, ROUTINE W REFLEX MICROSCOPIC     Status: None   Collection Time    03/08/13  8:55 AM      Result Value Range   Color, Urine YELLOW  YELLOW   APPearance CLEAR  CLEAR   Specific Gravity, Urine 1.010  1.005 - 1.030   pH 6.0  5.0 - 8.0   Glucose, UA NEGATIVE  NEGATIVE mg/dL   Hgb urine dipstick NEGATIVE  NEGATIVE   Bilirubin Urine NEGATIVE  NEGATIVE   Ketones, ur NEGATIVE  NEGATIVE mg/dL   Protein, ur NEGATIVE  NEGATIVE mg/dL   Urobilinogen, UA 0.2  0.0 - 1.0 mg/dL   Nitrite NEGATIVE  NEGATIVE   Leukocytes, UA NEGATIVE  NEGATIVE   Comment: MICROSCOPIC NOT DONE ON URINES WITH NEGATIVE PROTEIN, BLOOD, LEUKOCYTES, NITRITE, OR GLUCOSE <1000 mg/dL.  MRSA PCR SCREENING     Status: None   Collection Time    03/08/13 12:07 PM      Result Value Range   MRSA by PCR NEGATIVE  NEGATIVE   Comment:            The GeneXpert MRSA Assay (FDA     approved for NASAL specimens     only), is one component of a     comprehensive MRSA colonization     surveillance program. It is not     intended to diagnose MRSA     infection nor to guide or     monitor treatment for     MRSA infections.  GLUCOSE, CAPILLARY     Status: Abnormal   Collection Time    03/08/13 12:37 PM      Result Value Range   Glucose-Capillary 241 (*) 70 - 99 mg/dL   Comment 1 Notify RN    TROPONIN I     Status: Abnormal    Collection Time    03/08/13  1:24 PM      Result Value Range   Troponin I 0.52 (*) <0.30 ng/mL   Comment:            Due to the release kinetics of cTnI,     a negative result within the first hours     of the onset of symptoms does not rule out     myocardial infarction with certainty.     If myocardial infarction is still suspected,     repeat the test at appropriate intervals.     CRITICAL RESULT CALLED TO, READ BACK BY AND VERIFIED WITH:     WHITE C RN 03/08/13 1416 COSTELLO B  BASIC METABOLIC PANEL     Status: Abnormal   Collection Time    03/08/13  1:24 PM      Result Value Range   Sodium 136  135 - 145 mEq/L   Potassium 4.3  3.5 - 5.1 mEq/L   Chloride 100  96 - 112 mEq/L  CO2 22  19 - 32 mEq/L   Glucose, Bld 218 (*) 70 - 99 mg/dL   BUN 22  6 - 23 mg/dL   Creatinine, Ser 1.61  0.50 - 1.35 mg/dL   Calcium 8.8  8.4 - 09.6 mg/dL   GFR calc non Af Amer 63 (*) >90 mL/min   GFR calc Af Amer 73 (*) >90 mL/min   Comment:            The eGFR has been calculated     using the CKD EPI equation.     This calculation has not been     validated in all clinical     situations.     eGFR's persistently     <90 mL/min signify     possible Chronic Kidney Disease.  GLUCOSE, CAPILLARY     Status: Abnormal   Collection Time    03/08/13  5:47 PM      Result Value Range   Glucose-Capillary 188 (*) 70 - 99 mg/dL   Comment 1 Notify RN    HEPARIN LEVEL (UNFRACTIONATED)     Status: None   Collection Time    03/08/13  6:41 PM      Result Value Range   Heparin Unfractionated 0.39  0.30 - 0.70 IU/mL   Comment:            IF HEPARIN RESULTS ARE BELOW     EXPECTED VALUES, AND PATIENT     DOSAGE HAS BEEN CONFIRMED,     SUGGEST FOLLOW UP TESTING     OF ANTITHROMBIN III LEVELS.  TROPONIN I     Status: Abnormal   Collection Time    03/08/13  8:02 PM      Result Value Range   Troponin I 0.73 (*) <0.30 ng/mL   Comment:            Due to the release kinetics of cTnI,     a negative result  within the first hours     of the onset of symptoms does not rule out     myocardial infarction with certainty.     If myocardial infarction is still suspected,     repeat the test at appropriate intervals.     CRITICAL VALUE NOTED.  VALUE IS CONSISTENT WITH PREVIOUSLY REPORTED AND CALLED VALUE.  GLUCOSE, CAPILLARY     Status: Abnormal   Collection Time    03/08/13  9:59 PM      Result Value Range   Glucose-Capillary 144 (*) 70 - 99 mg/dL   Comment 1 Notify RN    CBC     Status: Abnormal   Collection Time    03/09/13  4:35 AM      Result Value Range   WBC 9.6  4.0 - 10.5 K/uL   RBC 4.27  4.22 - 5.81 MIL/uL   Hemoglobin 14.1  13.0 - 17.0 g/dL   HCT 04.5  40.9 - 81.1 %   MCV 96.0  78.0 - 100.0 fL   MCH 33.0  26.0 - 34.0 pg   MCHC 34.4  30.0 - 36.0 g/dL   RDW 91.4 (*) 78.2 - 95.6 %   Platelets 136 (*) 150 - 400 K/uL   Comment: REPEATED TO VERIFY     DELTA CHECK NOTED  BASIC METABOLIC PANEL     Status: Abnormal   Collection Time    03/09/13  4:35 AM      Result Value Range   Sodium 134 (*) 135 -  145 mEq/L   Potassium 4.3  3.5 - 5.1 mEq/L   Chloride 97  96 - 112 mEq/L   CO2 26  19 - 32 mEq/L   Glucose, Bld 146 (*) 70 - 99 mg/dL   BUN 29 (*) 6 - 23 mg/dL   Creatinine, Ser 1.61  0.50 - 1.35 mg/dL   Calcium 8.8  8.4 - 09.6 mg/dL   GFR calc non Af Amer 57 (*) >90 mL/min   GFR calc Af Amer 66 (*) >90 mL/min   Comment:            The eGFR has been calculated     using the CKD EPI equation.     This calculation has not been     validated in all clinical     situations.     eGFR's persistently     <90 mL/min signify     possible Chronic Kidney Disease.  HEPARIN LEVEL (UNFRACTIONATED)     Status: Abnormal   Collection Time    03/09/13  4:35 AM      Result Value Range   Heparin Unfractionated 0.28 (*) 0.30 - 0.70 IU/mL   Comment:            IF HEPARIN RESULTS ARE BELOW     EXPECTED VALUES, AND PATIENT     DOSAGE HAS BEEN CONFIRMED,     SUGGEST FOLLOW UP TESTING     OF  ANTITHROMBIN III LEVELS.  LIPID PANEL     Status: Abnormal   Collection Time    03/09/13  4:35 AM      Result Value Range   Cholesterol 197  0 - 200 mg/dL   Triglycerides 045 (*) <150 mg/dL   HDL 42  >40 mg/dL   Total CHOL/HDL Ratio 4.7     VLDL 40  0 - 40 mg/dL   LDL Cholesterol 981 (*) 0 - 99 mg/dL   Comment:            Total Cholesterol/HDL:CHD Risk     Coronary Heart Disease Risk Table                         Men   Women      1/2 Average Risk   3.4   3.3      Average Risk       5.0   4.4      2 X Average Risk   9.6   7.1      3 X Average Risk  23.4   11.0                Use the calculated Patient Ratio     above and the CHD Risk Table     to determine the patient's CHD Risk.                ATP III CLASSIFICATION (LDL):      <100     mg/dL   Optimal      191-478  mg/dL   Near or Above                        Optimal      130-159  mg/dL   Borderline      295-621  mg/dL   High      >308     mg/dL   Very High  GLUCOSE, CAPILLARY     Status: Abnormal  Collection Time    03/09/13  8:00 AM      Result Value Range   Glucose-Capillary 136 (*) 70 - 99 mg/dL    Imaging: Dg Chest Port 1 View  03/08/2013  *RADIOLOGY REPORT*  Clinical Data: Short of breath.  Chest pain.  PORTABLE CHEST - 1 VIEW  Comparison: 01/11/2013.  Findings: Cardiomegaly.  Low volume chest.  Basilar atelectasis. Interstitial pulmonary edema is suspected, superimposed on the basilar atelectasis.  Median sternotomy, likely for CABG.  No focal consolidation.  IMPRESSION:  1.  Low volume chest. 2.  Cardiomegaly with suspected mild interstitial pulmonary edema. Consider repeat PA and lateral chest radiograph with full inspiration when patient condition permits for better assessment of interstitial pulmonary edema.   Original Report Authenticated By: Andreas Newport, M.D.     Assessment:  1. Principal Problem: 2.   Acute on chronic systolic HF (heart failure) 3. Active Problems: 4.   DYSLIPIDEMIA 5.   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   Atrial fibrillation, permanent, now wtih RVR at 170 7.   Unstable angina 8.   Chronic anticoagulation, on coumadin 9.   Cardiomyopathy, ischemic EF 25% at one time most recent cath EF 40-45% 01/2013 10.   Diabetes mellitus 11.   Plan:  1. HR control improved today on diltiazem. Change IV to po cardizem CD 240 mg daily. He diuresed almost -5L negative yesterday, will decrease diuretics today in the face of rising creatinine.  He is on aspirin, plavix and warfarin - may wish to discontinue aspirin when he is again therapeutic on warfarin to minimize bleeding risk, but would continue plavix with his underlying CAD and recent NSTEMI.  Time Spent Directly with Patient:  15 minutes  Length of Stay:  LOS: 1 day   Chrystie Nose, MD, [facility] Attending Cardiologist [facility]  Nai Dasch,Adolf C 03/09/2013, 8:43 AM

## 2013-03-09 NOTE — Progress Notes (Signed)
Paged the PA on call for [facility]. PA made aware of pt elevated troponin. PA stated that the team was aware. No new orders received.

## 2013-03-09 NOTE — Progress Notes (Signed)
ANTICOAGULATION CONSULT NOTE - Follow Up Consult  Pharmacy Consult for Heparin Indication: chest pain/ACS and atrial fibrillation  Allergies  Allergen Reactions  . Clarithromycin Other (See Comments)    Unknown, thinks "blisters in mouth"  . Influenza Vaccines Other (See Comments)    Severe coughing for weeks after vaccine; also had flu like symptoms for about 7 to 8 weeks.   . Lisinopril Other (See Comments)    cough  . Ranexa (Ranolazine Er)   . Statins     Patient Measurements: Height: 6' (182.9 cm) Weight: 203 lb 7.8 oz (92.3 kg) IBW/kg (Calculated) : 77.6 Heparin Dosing Weight: 92kg  Vital Signs: Temp: 98.4 F (36.9 C) (03/27 0755) Temp src: Oral (03/27 0755) BP: 130/85 mmHg (03/27 0755) Pulse Rate: 70 (03/27 0755)  Labs:  Recent Labs  03/08/13 0529 03/08/13 0821 03/08/13 1324 03/08/13 1841 03/08/13 2002 03/09/13 0435  HGB 15.2  --   --   --   --  14.1  HCT 44.3  --   --   --   --  41.0  PLT 194  --   --   --   --  136*  LABPROT 18.1*  --   --   --   --   --   INR 1.55*  --   --   --   --   --   HEPARINUNFRC  --   --   --  0.39  --  0.28*  CREATININE 1.07  --  1.13  --   --  1.22  CKTOTAL  --  38  --   --   --   --   CKMB  --  3.0  --   --   --   --   TROPONINI  --  <0.30 0.52*  --  0.73*  --     Estimated Creatinine Clearance: 59.2 ml/min (by C-G formula based on Cr of 1.22).   Medications:  Scheduled:  . aspirin EC  81 mg Oral Daily  . calcium carbonate  1 tablet Oral Q breakfast  . clopidogrel  75 mg Oral Daily  . diltiazem  240 mg Oral Daily  . [COMPLETED] diltiazem (CARDIZEM) infusion  10 mg/hr Intravenous Once  . furosemide  40 mg Intravenous BID  . [COMPLETED] furosemide  80 mg Intravenous Once  . [COMPLETED] heparin  2,500 Units Intravenous Once  . insulin aspart  0-5 Units Subcutaneous QHS  . insulin aspart  0-9 Units Subcutaneous TID WC  . isosorbide mononitrate  60 mg Oral Daily  . levothyroxine  150 mcg Oral QAC breakfast  .  metoprolol tartrate  25 mg Oral BID  . omega-3 acid ethyl esters  1 g Oral BID  . pantoprazole  40 mg Oral Q1200  . potassium chloride  20 mEq Oral BID  . predniSONE  5 mg Oral Q breakfast  . [DISCONTINUED] aspirin EC  81 mg Oral Daily  . [DISCONTINUED] aspirin  81 mg Oral Daily  . [DISCONTINUED] calcium carbonate  600 mg Oral Q lunch  . [DISCONTINUED] furosemide  80 mg Intravenous Q12H  . [DISCONTINUED] furosemide  80 mg Intravenous BID  . [DISCONTINUED] predniSONE  5 mg Oral Daily    Assessment: 72 yo male on heparin for CP in the setting of known CAD, pulmonary issues and atrial fibrillation to resume Coumadin. Coumadin PTA (home dose: 5mg  MWF and 7.5mg  TTSS) for atrial fibrillation. Coumadin held on admission.  Troponins elevated overnight, not cath candidate at this  time.   INR on admission is subtherapeutic at 1.55 and HL today is subtherapuetic at 0.28. H/H 14.1/41 - stable and plts 136 << 194. Scr 1.22 and CrCl 29ml/min.   Goal of Therapy:  INR 2-3 Heparin level 0.3-0.7 units/ml Monitor platelets by anticoagulation protocol: Yes   Plan:  Increase heparin to 1450 units/hr  Coumadin 7.5mg  x1 tonight  Check HL and PT/INR with morning labs  Daily CBC    Micheline Chapman PharmD Candidate 03/09/2013,9:51 AM

## 2013-03-10 ENCOUNTER — Inpatient Hospital Stay (HOSPITAL_COMMUNITY): Payer: Medicare Other

## 2013-03-10 LAB — BASIC METABOLIC PANEL
CO2: 28 mEq/L (ref 19–32)
Glucose, Bld: 213 mg/dL — ABNORMAL HIGH (ref 70–99)
Potassium: 4.9 mEq/L (ref 3.5–5.1)
Sodium: 131 mEq/L — ABNORMAL LOW (ref 135–145)

## 2013-03-10 LAB — CBC
Hemoglobin: 15.7 g/dL (ref 13.0–17.0)
MCHC: 35.7 g/dL (ref 30.0–36.0)
RDW: 16.8 % — ABNORMAL HIGH (ref 11.5–15.5)

## 2013-03-10 LAB — GLUCOSE, CAPILLARY
Glucose-Capillary: 135 mg/dL — ABNORMAL HIGH (ref 70–99)
Glucose-Capillary: 220 mg/dL — ABNORMAL HIGH (ref 70–99)

## 2013-03-10 LAB — PROTIME-INR
INR: 1.14 (ref 0.00–1.49)
Prothrombin Time: 14.4 seconds (ref 11.6–15.2)

## 2013-03-10 LAB — HEPARIN LEVEL (UNFRACTIONATED): Heparin Unfractionated: 0.25 IU/mL — ABNORMAL LOW (ref 0.30–0.70)

## 2013-03-10 MED ORDER — WARFARIN SODIUM 7.5 MG PO TABS
7.5000 mg | ORAL_TABLET | Freq: Once | ORAL | Status: AC
  Administered 2013-03-10: 7.5 mg via ORAL
  Filled 2013-03-10: qty 1

## 2013-03-10 MED ORDER — DILTIAZEM HCL ER COATED BEADS 120 MG PO CP24
120.0000 mg | ORAL_CAPSULE | Freq: Every day | ORAL | Status: DC
  Administered 2013-03-11 – 2013-03-14 (×4): 120 mg via ORAL
  Filled 2013-03-10 (×5): qty 1

## 2013-03-10 MED ORDER — GERHARDT'S BUTT CREAM
TOPICAL_CREAM | Freq: Two times a day (BID) | CUTANEOUS | Status: DC
  Administered 2013-03-10 – 2013-03-12 (×5): via TOPICAL
  Administered 2013-03-13: 1 via TOPICAL
  Administered 2013-03-13 – 2013-03-14 (×2): via TOPICAL
  Filled 2013-03-10 (×2): qty 1

## 2013-03-10 MED ORDER — DILTIAZEM HCL ER COATED BEADS 240 MG PO CP24
240.0000 mg | ORAL_CAPSULE | Freq: Every day | ORAL | Status: DC
  Administered 2013-03-11 – 2013-03-13 (×3): 240 mg via ORAL
  Filled 2013-03-10 (×4): qty 1

## 2013-03-10 MED ORDER — FUROSEMIDE 40 MG PO TABS
40.0000 mg | ORAL_TABLET | Freq: Every day | ORAL | Status: DC
  Administered 2013-03-11 – 2013-03-13 (×3): 40 mg via ORAL
  Filled 2013-03-10 (×3): qty 1

## 2013-03-10 NOTE — Progress Notes (Signed)
ANTICOAGULATION CONSULT NOTE - Follow Up Consult  Pharmacy Consult for Heparin Indication: chest pain/ACS and atrial fibrillation  Allergies  Allergen Reactions  . Clarithromycin Other (See Comments)    Unknown, thinks "blisters in mouth"  . Influenza Vaccines Other (See Comments)    Severe coughing for weeks after vaccine; also had flu like symptoms for about 7 to 8 weeks.   . Lisinopril Other (See Comments)    cough  . Ranexa (Ranolazine Er)   . Statins     Patient Measurements: Height: 6' (182.9 cm) Weight: 195 lb 15.8 oz (88.9 kg) IBW/kg (Calculated) : 77.6 Heparin Dosing Weight: 92kg  Vital Signs: Temp: 97.5 F (36.4 C) (03/28 0825) Temp src: Oral (03/28 0825) BP: 146/87 mmHg (03/28 0825) Pulse Rate: 88 (03/28 0825)  Labs:  Recent Labs  03/08/13 0529 03/08/13 0821 03/08/13 1324 03/08/13 1841 03/08/13 2002 03/09/13 0435 03/10/13 0635  HGB 15.2  --   --   --   --  14.1 15.7  HCT 44.3  --   --   --   --  41.0 44.0  PLT 194  --   --   --   --  136* 168  LABPROT 18.1*  --   --   --   --   --  14.4  INR 1.55*  --   --   --   --   --  1.14  HEPARINUNFRC  --   --   --  0.39  --  0.28* 0.25*  CREATININE 1.07  --  1.13  --   --  1.22  --   CKTOTAL  --  38  --   --   --   --   --   CKMB  --  3.0  --   --   --   --   --   TROPONINI  --  <0.30 0.52*  --  0.73*  --   --     Estimated Creatinine Clearance: 59.2 ml/min (by C-G formula based on Cr of 1.22).    Assessment: 72 yo male on heparin for CP in the setting of known CAD, pulmonary issues and atrial fibrillation to resume Coumadin. Coumadin PTA (home dose: 5mg  MWF and 7.5mg  TTSS) for atrial fibrillation. Coumadin held on admission.  Troponins elevated overnight, not cath candidate at this time.   INR on admission is subtherapeutic at 1.55 and HL today is subtherapuetic at 0.25 after dosage increase 3/27. CBC stable Goal of Therapy:  INR 2-3 Heparin level 0.3-0.7 units/ml Monitor platelets by anticoagulation  protocol: Yes   Plan:  Increase heparin to 1650 units/hr and check heparin level in 6 hours. Coumadin 7.5mg  x1 tonight  Check HL and PT/INR with morning labs  Daily CBC    Farrel Gobble Garth Bigness PharmD Candidate 03/10/2013,8:31 AM

## 2013-03-10 NOTE — Consult Note (Signed)
WOC consult Note Reason for Consult: healing shingles sites.  Pt and wife explained that pt had significant breakout of shingles at the time of last dc to home.  He was treated with antiviral and "cream". The areas extended along the lower back, into the gluteal skin fold, and into each groin. There is evidence of lesions in the groin that have healed, he still has some lesions that are dry along the lower back.  His gluteal clef is extremely erythematous with peeling of the skin, it is blanchable at the edges.  I feel this is both related to the history of varicella, but also worsened by moisture.   Wound type: recent varicella and MASD Wound [id number] erythema  with some partial thickness skin peeling. Drainage (amount, consistency, odor) none Periwound: intact Dressing procedure/placement/frequency: Gerhardt's butt cream BID, turn from side to side to allow area to dry more.  Re consult if needed, will not follow at this time. Thanks  Oriyah Lamp[employer], CWOCN [phone])

## 2013-03-10 NOTE — Progress Notes (Signed)
Back from xray

## 2013-03-10 NOTE — Progress Notes (Signed)
ANTICOAGULATION CONSULT NOTE - Follow Up Consult  Pharmacy Consult for Heparin Indication: chest pain/ACS and atrial fibrillation  Allergies  Allergen Reactions  . Clarithromycin Other (See Comments)    Unknown, thinks "blisters in mouth"  . Influenza Vaccines Other (See Comments)    Severe coughing for weeks after vaccine; also had flu like symptoms for about 7 to 8 weeks.   . Lisinopril Other (See Comments)    cough  . Ranexa (Ranolazine Er)   . Statins     Patient Measurements: Height: 6' (182.9 cm) Weight: 195 lb 15.8 oz (88.9 kg) IBW/kg (Calculated) : 77.6 Heparin Dosing Weight: 92 kg  Vital Signs: Temp: 98.2 F (36.8 C) (03/28 1229) Temp src: Oral (03/28 1229) BP: 100/76 mmHg (03/28 1229) Pulse Rate: 88 (03/28 1229)  Labs:  Recent Labs  03/08/13 0529 03/08/13 0821 03/08/13 1324  03/08/13 2002 03/09/13 0435 03/10/13 0635 03/10/13 1042 03/10/13 1433  HGB 15.2  --   --   --   --  14.1 15.7  --   --   HCT 44.3  --   --   --   --  41.0 44.0  --   --   PLT 194  --   --   --   --  136* 168  --   --   LABPROT 18.1*  --   --   --   --   --  14.4  --   --   INR 1.55*  --   --   --   --   --  1.14  --   --   HEPARINUNFRC  --   --   --   < >  --  0.28* 0.25*  --  0.53  CREATININE 1.07  --  1.13  --   --  1.22  --  1.23  --   CKTOTAL  --  38  --   --   --   --   --   --   --   CKMB  --  3.0  --   --   --   --   --   --   --   TROPONINI  --  <0.30 0.52*  --  0.73*  --   --   --   --   < > = values in this interval not displayed.  Estimated Creatinine Clearance: 58.7 ml/min (by C-G formula based on Cr of 1.23).  Medications: . sodium chloride 10 mL/hr at 03/09/13 1900  . heparin 1,650 Units/hr (03/10/13 1146)   Assessment: 72 yo male on heparin for CP in the setting of known CAD, pulmonary issues and atrial fibrillation while INR is below goal. Heparin level is therapeutic on 1650 units/hr. No bleeding noted.  Goal of Therapy:  Heparin level 0.3-0.7  units/ml Monitor platelets by anticoagulation protocol: Yes   Plan:  -Continue heparin drip at 1650 units/hr  -Daily heparin level and CBC while on heparin -Monitor for signs/symptoms of bleeding  [facility], [place].D., BCPS Clinical Pharmacist Pager: [phone] 03/10/2013 3:38 PM

## 2013-03-10 NOTE — Progress Notes (Signed)
Report called to floor and given to Nino Glow, RN IV.

## 2013-03-10 NOTE — Progress Notes (Signed)
Subjective: Feels much better, has condom cath on, was very tired from having to sit on side of bed to void.  Concern for wound on buttocks from previous shingles.  Objective: Vital signs in last 24 hours: Temp:  [97.1 F (36.2 C)-98.4 F (36.9 C)] 97.5 F (36.4 C) (03/28 0825) Pulse Rate:  [70-108] 88 (03/28 0825) Resp:  [10-23] 23 (03/28 0825) BP: (104-146)/(64-95) 146/87 mmHg (03/28 0825) SpO2:  [93 %-100 %] 98 % (03/28 0825) Weight:  [88.9 kg (195 lb 15.8 oz)] 88.9 kg (195 lb 15.8 oz) (03/28 0500) Weight change: -3.4 kg (-7 lb 7.9 oz) Last BM Date: 03/09/13 Intake/Output from previous day: -2510  Wt 88.9 down from 92.3 on admit. Pt's wt when he left hospital in Jan was 94 Kg (207 lbs) and 206 pounds prior to admit.   03/27 0701 - 03/28 0700 In: 1048.4 [P.O.:580; I.V.:468.4] Out: 3850 [Urine:3850] Intake/Output this shift:    PE: General:alert and oriented, pleasant affect, MAE, follows commands Skin: area of irritation on Lt buttock Neck: no JVD at 30 degrees Heart:irreg irreg A fib with most HRs in the 70's currently 100 though. Lungs: decreased breath sounds bibasilar, no rales, improved from admit. WUJ:WJXB, obese, non tender + BS Ext:no edema, pedal pulses present     Lab Results:  Recent Labs  03/09/13 0435 03/10/13 0635  WBC 9.6 11.8*  HGB 14.1 15.7  HCT 41.0 44.0  PLT 136* 168   BMET  Recent Labs  03/08/13 1324 03/09/13 0435  NA 136 134*  K 4.3 4.3  CL 100 97  CO2 22 26  GLUCOSE 218* 146*  BUN 22 29*  CREATININE 1.13 1.22  CALCIUM 8.8 8.8    Recent Labs  03/08/13 1324 03/08/13 2002  TROPONINI 0.52* 0.73*    Lab Results  Component Value Date   CHOL 197 03/09/2013   HDL 42 03/09/2013   LDLCALC 115* 03/09/2013   TRIG 198* 03/09/2013   CHOLHDL 4.7 03/09/2013   Lab Results  Component Value Date   HGBA1C 7.2* 01/14/2013     Lab Results  Component Value Date   TSH 0.832 03/08/2013    Hepatic Function Panel  Recent Labs  03/08/13 0821   PROT 6.9  ALBUMIN 3.6  AST 84*  ALT 244*  ALKPHOS 109  BILITOT 1.2  BILIDIR 0.5*  IBILI 0.7    Recent Labs  03/09/13 0435  CHOL 197     Studies/Results: No results found.  Medications: I have reviewed the patient's current medications. Scheduled Meds: . antiseptic oral rinse  15 mL Mouth Rinse BID  . aspirin EC  81 mg Oral Daily  . calcium carbonate  1 tablet Oral Q breakfast  . clopidogrel  75 mg Oral Daily  . diltiazem  240 mg Oral Daily  . furosemide  40 mg Intravenous BID  . insulin aspart  0-5 Units Subcutaneous QHS  . insulin aspart  0-9 Units Subcutaneous TID WC  . isosorbide mononitrate  60 mg Oral Daily  . levothyroxine  150 mcg Oral QAC breakfast  . metoprolol tartrate  37.5 mg Oral BID  . omega-3 acid ethyl esters  1 g Oral BID  . pantoprazole  40 mg Oral Q1200  . potassium chloride  20 mEq Oral BID  . predniSONE  5 mg Oral Q breakfast  . warfarin  7.5 mg Oral ONCE-1800  . Warfarin - Pharmacist Dosing Inpatient   Does not apply q1800   Continuous Infusions: . sodium chloride 10 mL/hr at  03/09/13 1900  . 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  Wt. Improving, now lower than last discharge wt.     His A fib is controlled with po cardizem, BP stable as outpatient meds adjusted due to hypotension.  He was on higher dose of cardizem  as outpatient with add 120 mg po at hs.  INR sub therapeutic on IV Heparin as well until anticoagulated with coumadin  NSTEMI, see last cath report, treating medically.  ? Change Lasix to po (total -9041 this admit.) will check labs today including pro bnp.  Will ask wound care nurse to check area on buttock.  Ambulate. ? Transfer to 4700. Home when stable and INR >2.0  LOS: 2 days   Time spent with pt. And chart : 30 minutes. INGOLD,LAURA R 03/10/2013, 9:01 AM   I have seen and examined the patient along with [facility] R, NP.  I have reviewed the chart, notes and new data.  I agree with NP's note.  Key new complaints: fatigue, abdominal fullness; no orthopnea Key examination changes: he looks tired and has mild periodic breathing; no JVD, but he has prompt hepatojugular reflux Key new findings / data: drastic improvement in proBNP (12000 on admission, now 2400, baseline 1200)  PLAN: Weight is much lower than in January - he has clearly lost a lot of "real" weight and we need to reassess his "dry" weight. He is still mildly hypervolemic, but approaching euvolemia. Overall,the clinical impression is that CHF exacerbation caused small subendocardial MI rather than a true new acute coronary event.  OK for transition to PO loop diuretics and transfer to telemetry. Not ready for discharge yet.  Thurmon Fair, MD, [facility] [phone] 03/10/2013, 1:51 PM

## 2013-03-10 NOTE — Progress Notes (Signed)
Patient evaluated for long-term disease management services with [facility] Care Management Program as a benefit of his [facility] Medicare. Spoke with patient and family at bedside to explain services. Patient's wife reports Mr Leggette does not need [facility] Care Management at this time. Appreciative of visit. Left [facility] Care Management brochure with wife and asked that they call if they should change their mind in the future.   Raiford Noble MSN-Ed, RN,BSN, [facility], [phone]

## 2013-03-10 NOTE — Progress Notes (Signed)
Pt arrived to unit from 2600.  Pt has been placed on telemetry and is currently Afib in the 80's.  Vitals have been taken and are stable at this time.  Pt has been oriented to unit and has call bell within reach.  Wife is present as well at the bedside.  Pt resting comfortably in bed and appears in no acute distress.  Pt transferred orders have been reviewed and relayed to night shift RN.  Reported off to receiving RN who denies any questions or concerns at this time. Nino Glow RN

## 2013-03-10 NOTE — Progress Notes (Signed)
CARDIAC REHAB PHASE I   PRE:  Rate/Rhythm: 101 afib    BP: sitting 124/88    SaO2: 96 RA  MODE:  Ambulation: 100 ft   POST:  Rate/Rhythm: 144 afib    BP: sitting 144/88     SaO2: 98 RA  Pt to BSC before walk with large BM. Used RW and assist x2 to walk short distance. Pt became fatigued when HR began increasing, highest was 144 afib. DOE. Exhausted after walk, to recliner. Still able to eat right after walk in recliner. Left off O2 in recliner. Will f/u. [phone]   [name] CES, ACSM 03/10/2013 2:13 PM

## 2013-03-11 LAB — BASIC METABOLIC PANEL
CO2: 21 mEq/L (ref 19–32)
Calcium: 9 mg/dL (ref 8.4–10.5)
Creatinine, Ser: 1.12 mg/dL (ref 0.50–1.35)
Glucose, Bld: 136 mg/dL — ABNORMAL HIGH (ref 70–99)

## 2013-03-11 LAB — HEPARIN LEVEL (UNFRACTIONATED)
Heparin Unfractionated: 0.91 IU/mL — ABNORMAL HIGH (ref 0.30–0.70)
Heparin Unfractionated: 0.92 IU/mL — ABNORMAL HIGH (ref 0.30–0.70)

## 2013-03-11 LAB — GLUCOSE, CAPILLARY
Glucose-Capillary: 187 mg/dL — ABNORMAL HIGH (ref 70–99)
Glucose-Capillary: 179 mg/dL — ABNORMAL HIGH (ref 70–99)

## 2013-03-11 LAB — CBC
Hemoglobin: 15 g/dL (ref 13.0–17.0)
RBC: 4.51 MIL/uL (ref 4.22–5.81)

## 2013-03-11 MED ORDER — HEPARIN (PORCINE) IN NACL 100-0.45 UNIT/ML-% IJ SOLN
1400.0000 [IU]/h | INTRAMUSCULAR | Status: DC
  Administered 2013-03-11 – 2013-03-13 (×3): 1400 [IU]/h via INTRAVENOUS
  Filled 2013-03-11 (×8): qty 250

## 2013-03-11 MED ORDER — WARFARIN SODIUM 7.5 MG PO TABS
7.5000 mg | ORAL_TABLET | Freq: Once | ORAL | Status: AC
  Administered 2013-03-11: 7.5 mg via ORAL
  Filled 2013-03-11 (×2): qty 1

## 2013-03-11 MED ORDER — FUROSEMIDE 20 MG PO TABS
20.0000 mg | ORAL_TABLET | Freq: Once | ORAL | Status: AC
  Administered 2013-03-11: 20 mg via ORAL
  Filled 2013-03-11: qty 1

## 2013-03-11 MED ORDER — ISOSORBIDE MONONITRATE ER 30 MG PO TB24
30.0000 mg | ORAL_TABLET | Freq: Every day | ORAL | Status: DC
  Administered 2013-03-11 – 2013-03-14 (×4): 30 mg via ORAL
  Filled 2013-03-11 (×5): qty 1

## 2013-03-11 NOTE — Progress Notes (Signed)
Subjective: During the night he developed chest pain, similar to admit but not as severe.  Then he developed SOB.  Lasted 15 min and resolved without treatment No pain with ambulation yesterday.  Just very weak. Objective: Vital signs in last 24 hours: Temp:  [97.2 F (36.2 C)-98.9 F (37.2 C)] 97.2 F (36.2 C) (03/29 0439) Pulse Rate:  [79-92] 85 (03/29 0439) Resp:  [12-21] 18 (03/29 0439) BP: (100-160)/(72-98) 110/79 mmHg (03/29 0439) SpO2:  [90 %-98 %] 98 % (03/29 0439) Weight:  [88.089 kg (194 lb 3.2 oz)-89.3 kg (196 lb 13.9 oz)] 88.089 kg (194 lb 3.2 oz) (03/29 0439) Weight change: 0.4 kg (14.1 oz) Last BM Date: 03/10/13 Intake/Output from previous day:  -2125  Wt 88kg down from 92.3    -9356 total from admit 03/28 0701 - 03/29 0700 In: 1324.5 [P.O.:600; I.V.:724.5] Out: 3450 [Urine:3450] Intake/Output this shift: Total I/O In: 360 [P.O.:360] Out: -   PE: General:alert and oriented X 3, pleasant affect Heart:irreg irreg but rate controlled  Lungs:clear much improved from admit. Abd:+ BS, soft, non tender Ext:no edema SKIN: drying shingle on lower back.  + redness with cream   Lab Results:  Recent Labs  03/10/13 0635 03/11/13 0500  WBC 11.8* 11.5*  HGB 15.7 15.0  HCT 44.0 43.3  PLT 168 160   BMET  Recent Labs  03/10/13 1042 03/11/13 0500  NA 131* 134*  K 4.9 4.6  CL 94* 97  CO2 28 21  GLUCOSE 213* 136*  BUN 28* 28*  CREATININE 1.23 1.12  CALCIUM 9.3 9.0    Recent Labs  03/08/13 1324 03/08/13 2002  TROPONINI 0.52* 0.73*    Lab Results  Component Value Date   CHOL 197 03/09/2013   HDL 42 03/09/2013   LDLCALC 115* 03/09/2013   TRIG 198* 03/09/2013   CHOLHDL 4.7 03/09/2013   Lab Results  Component Value Date   HGBA1C 7.2* 01/14/2013     Lab Results  Component Value Date   TSH 0.832 03/08/2013     Studies/Results: Dg Chest 2 View  03/10/2013  *RADIOLOGY REPORT*  Clinical Data: Congestive heart failure.  CHEST - 2 VIEW  Comparison: March 08, 2013.  Findings: Stable cardiomegaly.  No pneumothorax is noted. Minimal left pleural effusion is noted.  Sternotomy wires are noted.  No acute pulmonary disease is noted.  IMPRESSION: Minimal left pleural effusion.  No other acute abnormality seen in the chest.   Original Report Authenticated By: Lupita Raider.,  M.D.     Medications: I have reviewed the patient's current medications. Scheduled Meds: . antiseptic oral rinse  15 mL Mouth Rinse BID  . aspirin EC  81 mg Oral Daily  . calcium carbonate  1 tablet Oral Q breakfast  . clopidogrel  75 mg Oral Daily  . diltiazem  120 mg Oral Daily  . diltiazem  240 mg Oral QHS  . furosemide  40 mg Oral Daily  . Gerhardt's butt cream   Topical BID  . insulin aspart  0-5 Units Subcutaneous QHS  . insulin aspart  0-9 Units Subcutaneous TID WC  . isosorbide mononitrate  60 mg Oral Daily  . levothyroxine  150 mcg Oral QAC breakfast  . metoprolol tartrate  37.5 mg Oral BID  . omega-3 acid ethyl esters  1 g Oral BID  . pantoprazole  40 mg Oral Q1200  . potassium chloride  20 mEq Oral BID  . predniSONE  5 mg Oral Q breakfast  . warfarin  7.5 mg Oral ONCE-1800  . Warfarin - Pharmacist Dosing Inpatient   Does not apply q1800   Continuous Infusions: . sodium chloride 10 mL/hr at 03/09/13 1900  . 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 Glucose stable.  Hyponatremia improving.  INR 1.23 on Heparin crossover    HR improved on dilt 240 at pm and 120 in am Still with [place].  Imdur at 60, at previous discharge was on 90.  Will increase 60 in am and 30 at 1500  LOS: 3 days   Time spent with pt. :15 minutes. INGOLD,LAURA R 03/11/2013, 9:41 AM    Agree with note written by Nada Boozer RNP  ISCM, CAF on coumadin A?C (INR sub therapeutic), VR controlled. Meds adjusted. On PO diuretics. I/O slightly neg. BNP decreadsing. Lungs clear on exam. No periph edema. Pt admits to dietary non compliance to salt. Continue to adjust meds. Home when INR therapeutic and euvolemic. CRH.  Runell Gess 03/11/2013 11:14 AM

## 2013-03-11 NOTE — Progress Notes (Signed)
CARDIAC REHAB PHASE I   PRE:  Rate/Rhythm: 93 afib    BP: sitting 100/70    SaO2: 95 2 1/2 L, 92 RA  MODE:  Ambulation: 460 ft   POST:  Rate/Rhythm: 146 afib    BP: sitting 110/80     SaO2: 95 RA  Pt stronger today, less exerted. Used RW, assist x2. HR still high today with activity. SaO2 good. Feels better. To recliner without O2. Will f/u. [phone]  Elissa Lovett [place] CES, ACSM 03/11/2013 12:35 PM

## 2013-03-11 NOTE — Progress Notes (Signed)
ANTICOAGULATION CONSULT NOTE - Follow Up Consult  Pharmacy Consult for Heparin/Coumadin Indication: chest pain/ACS and atrial fibrillation  Allergies  Allergen Reactions  . Clarithromycin Other (See Comments)    Unknown, thinks "blisters in mouth"  . Influenza Vaccines Other (See Comments)    Severe coughing for weeks after vaccine; also had flu like symptoms for about 7 to 8 weeks.   . Lisinopril Other (See Comments)    cough  . Ranexa (Ranolazine Er)   . Statins     Patient Measurements: Height: 6' (182.9 cm) Weight: 194 lb 3.2 oz (88.089 kg) (scale b) IBW/kg (Calculated) : 77.6 Heparin Dosing Weight: 88 kg  Vital Signs: Temp: 97.2 F (36.2 C) (03/29 0439) Temp src: Oral (03/29 0439) BP: 110/79 mmHg (03/29 0439) Pulse Rate: 85 (03/29 0439)  Labs:  Recent Labs  03/08/13 0821 03/08/13 1324  03/08/13 2002  03/09/13 0435 03/10/13 0635 03/10/13 1042 03/10/13 1433 03/11/13 0500  HGB  --   --   --   --   < > 14.1 15.7  --   --  15.0  HCT  --   --   --   --   --  41.0 44.0  --   --  43.3  PLT  --   --   --   --   --  136* 168  --   --  160  LABPROT  --   --   --   --   --   --  14.4  --   --  15.3*  INR  --   --   --   --   --   --  1.14  --   --  1.23  HEPARINUNFRC  --   --   < >  --   --  0.28* 0.25*  --  0.53 0.91*  CREATININE  --  1.13  --   --   --  1.22  --  1.23  --   --   CKTOTAL 38  --   --   --   --   --   --   --   --   --   CKMB 3.0  --   --   --   --   --   --   --   --   --   TROPONINI <0.30 0.52*  --  0.73*  --   --   --   --   --   --   < > = values in this interval not displayed.  Estimated Creatinine Clearance: 58.7 ml/min (by C-G formula based on Cr of 1.23).   Medications:  Scheduled:  . antiseptic oral rinse  15 mL Mouth Rinse BID  . aspirin EC  81 mg Oral Daily  . calcium carbonate  1 tablet Oral Q breakfast  . clopidogrel  75 mg Oral Daily  . diltiazem  120 mg Oral Daily  . diltiazem  240 mg Oral QHS  . furosemide  40 mg Oral Daily   . Gerhardt's butt cream   Topical BID  . insulin aspart  0-5 Units Subcutaneous QHS  . insulin aspart  0-9 Units Subcutaneous TID WC  . isosorbide mononitrate  60 mg Oral Daily  . levothyroxine  150 mcg Oral QAC breakfast  . metoprolol tartrate  37.5 mg Oral BID  . omega-3 acid ethyl esters  1 g Oral BID  . pantoprazole  40 mg Oral Q1200  . potassium  chloride  20 mEq Oral BID  . predniSONE  5 mg Oral Q breakfast  . [COMPLETED] warfarin  7.5 mg Oral ONCE-1800  . Warfarin - Pharmacist Dosing Inpatient   Does not apply q1800  . [DISCONTINUED] diltiazem  240 mg Oral Daily  . [DISCONTINUED] furosemide  40 mg Intravenous BID   Infusions:  . sodium chloride 10 mL/hr at 03/09/13 1900  . heparin 1,650 Units/hr (03/11/13 0402)    Assessment: 72 yo male on heparin for CP in the setting of known CAD, pulmonary issues and atrial fibrillation. Patient's troponins were elevated, but patient not a cath candidate at this time.   Patient was on Coumadin PTA (home dose: 5mg  MWF and 7.5mg  TTSS) for atrial fibrillation. This was held on admission and was resumed on 3/27.   INR today (after 2 doses of Coumadin) is 1.23. HL is elevated at 0.91. CBC and renal function are stable. No bleeding noted.   Goal of Therapy:  INR 2-3 Heparin level 0.3-0.7 units/ml Monitor platelets by anticoagulation protocol: Yes   Plan:   - Decrease heparin infusion to 1500 units/hr - Check heparin level in 8 hours and daily while on heparin - Coumadin 7.5mg  x1 tonight - F/u daily INR - Continue to monitor H&H and platelets  Lillia Pauls, PharmD Clinical Pharmacist Pager: [phone] Phone: [phone] 03/11/2013 8:15 AM

## 2013-03-11 NOTE — Progress Notes (Signed)
ANTICOAGULATION CONSULT NOTE - Follow Up Consult  Pharmacy Consult for Heparin  Indication: chest pain/ACS and atrial fibrillation   Allergies  Allergen Reactions  . Clarithromycin Other (See Comments)    Unknown, thinks "blisters in mouth"  . Influenza Vaccines Other (See Comments)    Severe coughing for weeks after vaccine; also had flu like symptoms for about 7 to 8 weeks.   . Lisinopril Other (See Comments)    cough  . Ranexa (Ranolazine Er)   . Statins     Patient Measurements: Height: 6' (182.9 cm) Weight: 194 lb 3.2 oz (88.089 kg) (scale b) IBW/kg (Calculated) : 77.6 Heparin Dosing Weight: 88kg  Vital Signs: Temp: 97.2 F (36.2 C) (03/29 1347) Temp src: Axillary (03/29 1347) BP: 128/86 mmHg (03/29 1028) Pulse Rate: 124 (03/29 1524)  Labs:  Recent Labs  03/08/13 2002  03/09/13 0435 03/10/13 0635 03/10/13 1042 03/10/13 1433 03/11/13 0500 03/11/13 1610  HGB  --   < > 14.1 15.7  --   --  15.0  --   HCT  --   --  41.0 44.0  --   --  43.3  --   PLT  --   --  136* 168  --   --  160  --   LABPROT  --   --   --  14.4  --   --  15.3*  --   INR  --   --   --  1.14  --   --  1.23  --   HEPARINUNFRC  --   --  0.28* 0.25*  --  0.53 0.91* 0.92*  CREATININE  --   --  1.22  --  1.23  --  1.12  --   TROPONINI 0.73*  --   --   --   --   --   --   --   < > = values in this interval not displayed.  Estimated Creatinine Clearance: 64.5 ml/min (by C-G formula based on Cr of 1.12).   Medications:  Heparin 1500 units/hr  Assessment: 72 yo male on heparin for CP in the setting of known CAD, pulmonary issues and atrial fibrillation. Heparin level (0.92) remains supratherapeutic despite rate decrease. Will plan to hold heparin drip ~30 minutes and restart at lower rate. - h/h and Plts wnl - No significant bleeding reported by RN  Goal of Therapy:  Heparin level 0.3-0.7 units/ml Monitor platelets by anticoagulation protocol: Yes   Plan:  1. Hold heparin drip x 30  minutes, then restart heparin drip 1400 units/hr @ 1845 2. Check heparin level 8 hours after restart   Cleon Dew [phone] 03/11/2013,6:03 PM

## 2013-03-11 NOTE — Evaluation (Signed)
Physical Therapy Evaluation Patient Details Name: [name] MRN: [id number] DOB: Jun 13, 1941 Today's Date: 03/11/2013 Time: 4098-1191 PT Time Calculation (min): 46 min  PT Assessment / Plan / Recommendation Clinical Impression  Patient is a 72 yo male admitted with CHF, chest pain, with A-fib with RVR.  Patient with general weakness, DOE and decreased balance impacting functional mobility.  Will benefit from acute PT to maximize independence prior to returning home with wife.  Recommend HHPT to continue therapy at discharge.    PT Assessment  Patient needs continued PT services    Follow Up Recommendations  Home health PT;Supervision/Assistance - 24 hour    Does the patient have the potential to tolerate intense rehabilitation      Barriers to Discharge None      Equipment Recommendations  None recommended by PT    Recommendations for Other Services     Frequency Min 3X/week    Precautions / Restrictions Precautions Precautions: Fall Precaution Comments: Patient has fallen x2 at home within past 3 weeks. Restrictions Weight Bearing Restrictions: No   Pertinent Vitals/Pain       Mobility  Bed Mobility Bed Mobility: Supine to Sit;Sit to Supine Supine to Sit: 4: Min guard;With rails;HOB elevated Sit to Supine: 4: Min guard;HOB elevated Details for Bed Mobility Assistance: Verbal cues for technique.  Assist for safety. Transfers Transfers: Sit to Stand;Stand to Sit Sit to Stand: 4: Min assist;With upper extremity assist;From bed Stand to Sit: 4: Min guard;With upper extremity assist;To bed Details for Transfer Assistance: Verbal cues for safe hand placement.  Assist to rise to standing and for balance. Ambulation/Gait Ambulation/Gait Assistance: 4: Min assist Ambulation Distance (Feet): 400 Feet Assistive device: Straight cane Ambulation/Gait Assistance Details: Verbal cues for correct use of cane.  Patient with loss of balance to right x 3 during gait,  requiring assist to maintain balance.  Patient aware of each episode.  Verbal cues to slow down - patient taking short fast steps.  Reviewed pursed-lip breathing during gait..  Did require 1 standing rest break. Gait Pattern: Step-through pattern;Decreased step length - right;Decreased step length - left;Lateral trunk lean to right;Lateral trunk lean to left    Exercises     PT Diagnosis: Difficulty walking;Abnormality of gait;Generalized weakness  PT Problem List: Decreased strength;Decreased activity tolerance;Decreased balance;Decreased mobility;Decreased knowledge of use of DME;Cardiopulmonary status limiting activity PT Treatment Interventions: DME instruction;Gait training;Functional mobility training;Patient/family education   PT Goals Acute Rehab PT Goals PT Goal Formulation: With patient/family Time For Goal Achievement: 03/18/13 Potential to Achieve Goals: Good Pt will go Sit to Stand: with supervision;with upper extremity assist PT Goal: Sit to Stand - Progress: Goal set today Pt will go Stand to Sit: with supervision;with upper extremity assist PT Goal: Stand to Sit - Progress: Goal set today Pt will Ambulate: >150 feet;with modified independence;with least restrictive assistive device (without loss of balance) PT Goal: Ambulate - Progress: Goal set today  Visit Information  Last PT Received On: 03/11/13 Assistance Needed: +1    Subjective Data  Subjective: "I stay short of breath all of the time" Patient Stated Goal: To go home.   Prior Functioning  Home Living Lives With: Spouse Available Help at Discharge: Family;Available 24 hours/day Type of Home: House Home Access: Stairs to enter;Ramped entrance Entrance Stairs-Number of Steps: 3 Entrance Stairs-Rails: Right;Left Home Layout: Two level;Able to live on main level with bedroom/bathroom Bathroom Shower/Tub: Engineer, manufacturing systems: Standard Home Adaptive Equipment: Straight cane;Walker - rolling;Bedside  commode/3-in-1;Shower chair without back  Prior Function Level of Independence: Independent with assistive device(s) Able to Take Stairs?: Yes Driving: Yes Vocation: Retired Musician: No difficulties    Copywriter, advertising Overall Cognitive Status: Appears within functional limits for tasks assessed/performed Arousal/Alertness: Awake/alert Orientation Level: Appears intact for tasks assessed Behavior During Session: [facility] for tasks performed    Extremity/Trunk Assessment Right Upper Extremity Assessment RUE ROM/Strength/Tone: WFL for tasks assessed RUE Sensation: WFL - Light Touch Left Upper Extremity Assessment LUE ROM/Strength/Tone: WFL for tasks assessed LUE Sensation: WFL - Light Touch Right Lower Extremity Assessment RLE ROM/Strength/Tone: Deficits RLE ROM/Strength/Tone Deficits: Strength 4/5 RLE Sensation: WFL - Light Touch Left Lower Extremity Assessment LLE ROM/Strength/Tone: Deficits LLE ROM/Strength/Tone Deficits: Strength 4/5 LLE Sensation: WFL - Light Touch   Balance    End of Session PT - End of Session Equipment Utilized During Treatment: Gait belt Activity Tolerance: Patient tolerated treatment well (Dyspnea 2/4 with gait) Patient left: in bed;with call bell/phone within reach;with family/visitor present Nurse Communication: Mobility status  GP     Vena Austria 03/11/2013, 4:53 PM Durenda Hurt. Renaldo Fiddler, [facility] Acute Rehab Services Pager [phone]

## 2013-03-12 LAB — PROTIME-INR
INR: 1.13 (ref 0.00–1.49)
Prothrombin Time: 14.3 seconds (ref 11.6–15.2)

## 2013-03-12 LAB — BASIC METABOLIC PANEL
CO2: 25 mEq/L (ref 19–32)
Calcium: 9.5 mg/dL (ref 8.4–10.5)
Creatinine, Ser: 1.32 mg/dL (ref 0.50–1.35)
GFR calc Af Amer: 60 mL/min — ABNORMAL LOW (ref >90)

## 2013-03-12 LAB — GLUCOSE, CAPILLARY
Glucose-Capillary: 145 mg/dL — ABNORMAL HIGH (ref 70–99)
Glucose-Capillary: 157 mg/dL — ABNORMAL HIGH (ref 70–99)

## 2013-03-12 LAB — CBC
MCHC: 33 g/dL (ref 30.0–36.0)
RDW: 16.4 % — ABNORMAL HIGH (ref 11.5–15.5)

## 2013-03-12 LAB — PRO B NATRIURETIC PEPTIDE: Pro B Natriuretic peptide (BNP): 2340 pg/mL — ABNORMAL HIGH (ref 0–125)

## 2013-03-12 MED ORDER — SAXAGLIPTIN-METFORMIN ER 2.5-1000 MG PO TB24: 1.0000 | ORAL_TABLET | Freq: Two times a day (BID) | ORAL | Status: DC

## 2013-03-12 MED ORDER — DIGOXIN 0.25 MG/ML IJ SOLN
0.5000 mg | Freq: Once | INTRAMUSCULAR | Status: AC
  Administered 2013-03-12: 0.5 mg via INTRAVENOUS
  Filled 2013-03-12: qty 2

## 2013-03-12 MED ORDER — LINAGLIPTIN 5 MG PO TABS
5.0000 mg | ORAL_TABLET | Freq: Every day | ORAL | Status: DC
  Administered 2013-03-12 – 2013-03-14 (×3): 5 mg via ORAL
  Filled 2013-03-12 (×3): qty 1

## 2013-03-12 MED ORDER — DIGOXIN 0.25 MG/ML IJ SOLN: 0.2500 mg | Freq: Every day | INTRAMUSCULAR | Status: DC

## 2013-03-12 MED ORDER — DIGOXIN 250 MCG PO TABS
0.2500 mg | ORAL_TABLET | Freq: Every day | ORAL | Status: DC
  Administered 2013-03-13: 0.25 mg via ORAL
  Filled 2013-03-12: qty 1

## 2013-03-12 MED ORDER — WARFARIN SODIUM 10 MG PO TABS
10.0000 mg | ORAL_TABLET | Freq: Once | ORAL | Status: AC
  Administered 2013-03-12: 10 mg via ORAL
  Filled 2013-03-12: qty 1

## 2013-03-12 MED ORDER — METFORMIN HCL 500 MG PO TABS
1000.0000 mg | ORAL_TABLET | Freq: Two times a day (BID) | ORAL | Status: DC
  Administered 2013-03-12 – 2013-03-14 (×4): 1000 mg via ORAL
  Filled 2013-03-12 (×6): qty 2

## 2013-03-12 NOTE — Progress Notes (Signed)
Subjective: Ambulated yesterday with more strength, PT recommend PT at home.  HR up to 147 last pm, pt SOB did not last long.    Objective: Vital signs in last 24 hours: Temp:  [97 F (36.1 C)-97.3 F (36.3 C)] 97 F (36.1 C) (03/30 0446) Pulse Rate:  [69-124] 69 (03/30 0446) Resp:  [18-22] 18 (03/30 0446) BP: (105-128)/(70-86) 108/84 mmHg (03/30 0446) SpO2:  [95 %-99 %] 99 % (03/30 0446) Weight:  [87.363 kg (192 lb 9.6 oz)] 87.363 kg (192 lb 9.6 oz) (03/30 0446) Weight change: -1.937 kg (-4 lb 4.3 oz) Last BM Date: 03/11/13 Intake/Output from previous day: -1264  Wt 87.3 down a kg. 03/29 0701 - 03/30 0700 In: 1610.3 [P.O.:1080; I.V.:530.3] Out: 2875 [Urine:2875] Intake/Output this shift: Total I/O In: 240 [P.O.:240] Out: -   PE: General:alert and oriented, no complaints Neck:no JVD Heart:irreg irreg Lungs:few crackles in Lt base Abd:+ BS soft, non tender Ext:no edema    Lab Results:  Recent Labs  03/11/13 0500 03/12/13 0420  WBC 11.5* 10.5  HGB 15.0 14.8  HCT 43.3 44.9  PLT 160 177   BMET  Recent Labs  03/11/13 0500 03/12/13 0420  NA 134* 133*  K 4.6 4.8  CL 97 98  CO2 21 25  GLUCOSE 136* 145*  BUN 28* 28*  CREATININE 1.12 1.32  CALCIUM 9.0 9.5   No results found for this basename: TROPONINI, CK, MB,  in the last 72 hours  Lab Results  Component Value Date   CHOL 197 03/09/2013   HDL 42 03/09/2013   LDLCALC 115* 03/09/2013   TRIG 198* 03/09/2013   CHOLHDL 4.7 03/09/2013   Lab Results  Component Value Date   HGBA1C 7.2* 01/14/2013     Lab Results  Component Value Date   TSH 0.832 03/08/2013        Studies/Results: Dg Chest 2 View  03/10/2013  *RADIOLOGY REPORT*  Clinical Data: Congestive heart failure.  CHEST - 2 VIEW  Comparison: March 08, 2013.  Findings: Stable cardiomegaly.  No pneumothorax is noted. Minimal left pleural effusion is noted.  Sternotomy wires are noted.  No acute pulmonary disease is noted.  IMPRESSION: Minimal left  pleural effusion.  No other acute abnormality seen in the chest.   Original Report Authenticated By: Lupita Raider.,  M.D.     Medications: I have reviewed the patient's current medications. [name] antiseptic oral rinse  15 mL Mouth Rinse BID  . aspirin EC  81 mg Oral Daily  . calcium carbonate  1 tablet Oral Q breakfast  . clopidogrel  75 mg Oral Daily  . diltiazem  120 mg Oral Daily  . diltiazem  240 mg Oral QHS  . furosemide  40 mg Oral Daily  . Gerhardt's butt cream   Topical BID  . insulin aspart  0-5 Units Subcutaneous QHS  . insulin aspart  0-9 Units Subcutaneous TID WC  . isosorbide mononitrate  30 mg Oral Q1500  . isosorbide mononitrate  60 mg Oral Daily  . levothyroxine  150 mcg Oral QAC breakfast  . metoprolol tartrate  37.5 mg Oral BID  . omega-3 acid ethyl esters  1 g Oral BID  . pantoprazole  40 mg Oral Q1200  . potassium chloride  20 mEq Oral BID  . predniSONE  5 mg Oral Q breakfast  . 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 Coumadin crossover Pro BNP 2340 today down from 12,639  (last discharge was 1774) Will add dig both for HR and HF, he is weak on the meds, this may help rate without going up on BB or dilt.  Resume home diabetic meds-glucophage.  D/c once INR therapeutic.  LOS: 4 days   Time spent with pt. Including MD time 20 minutes. INGOLD,LAURA R 03/12/2013, 10:09 AM

## 2013-03-12 NOTE — Progress Notes (Signed)
I have seen and evaluated the patient this AM along with Nada Boozer, NP.  I agree with her findings, examination as well as impression recommendations.   Overall doing fairly well.   UOP continues to be brisk -- BNP still in the 2000s, so will continue with diuresis for now. With mild rales, still probably has a few more liters to go.   Had some tachycardia last PM, but no CP with it.  This is reassuring, as he certainly has substrate for exertiona. / tachycardia related angina.  Since he seems to get tired/fatigued with lower BPs, will keep CCB &BB dose as is, but add Digoxin for additional rate control.  We have split his CCB dose in order to avoid precipitous BP drops.  Currently, the rate limiting factor is INR level -- appreciate Pharmacist assistance. Restarting DM medications.   Marykay Lex, M.D., M.S. [facility] [address] [place], [place]  [address]  [phone] Pager # [phone] 03/12/2013 11:18 AM

## 2013-03-12 NOTE — Progress Notes (Addendum)
The patient's HR climbed as high as 147 while lying in bed, but was unsustained.  The patient was short of breath and 2L of O2 via N/C was placed on him at this time. BP was 110/75.  20 mg of po Lasix had been given at 2045.  Scheduled po metoprolol and cardizem were given to the patient.  All of this information was relayed to the MD.  The RN was told to closely monitor the patient.  Approximately 10 minutes later, the patient's HR fell and maintained in upper 90s to low 120s.  At 0400, the patient's HR is in the 80s and 90s.

## 2013-03-12 NOTE — Progress Notes (Signed)
ANTICOAGULATION CONSULT NOTE - Follow Up Consult  Pharmacy Consult for Heparin  Indication: chest pain/ACS and atrial fibrillation   Labs:  Recent Labs  03/09/13 0435 03/10/13 0635 03/10/13 1042  03/11/13 0500 03/11/13 1610 03/12/13 0109  HGB 14.1 15.7  --   --  15.0  --   --   HCT 41.0 44.0  --   --  43.3  --   --   PLT 136* 168  --   --  160  --   --   LABPROT  --  14.4  --   --  15.3*  --   --   INR  --  1.14  --   --  1.23  --   --   HEPARINUNFRC 0.28* 0.25*  --   < > 0.91* 0.92* 0.54  CREATININE 1.22  --  1.23  --  1.12  --   --   < > = values in this interval not displayed.   Medications:  Heparin 1500 units/hr  Assessment: 72 yo male on heparin for CP in the setting of known CAD, pulmonary issues and atrial fibrillation. Heparin level now therapeutic.  Goal of Therapy:  Heparin level 0.3-0.7 units/ml Monitor platelets by anticoagulation protocol: Yes   Plan:  1. Continue heparin drip at 1400 units/hr 2. Check heparin level later today to confirm   Woodfin Ganja [phone] 03/12/2013,2:02 AM

## 2013-03-12 NOTE — Progress Notes (Signed)
ANTICOAGULATION CONSULT NOTE - Follow Up Consult  Pharmacy Consult for heparin/Coumadin Indication: chest pain/ACS and atrial fibrillation  Allergies  Allergen Reactions  . Clarithromycin Other (See Comments)    Unknown, thinks "blisters in mouth"  . Influenza Vaccines Other (See Comments)    Severe coughing for weeks after vaccine; also had flu like symptoms for about 7 to 8 weeks.   . Lisinopril Other (See Comments)    cough  . Ranexa (Ranolazine Er)   . Statins     Patient Measurements: Height: 6' (182.9 cm) Weight: 192 lb 9.6 oz (87.363 kg) (scale b) IBW/kg (Calculated) : 77.6 Heparin Dosing Weight: 87.4 kg  Vital Signs: Temp: 97 F (36.1 C) (03/30 0446) Temp src: Oral (03/30 0446) BP: 108/80 mmHg (03/30 1046) Pulse Rate: 85 (03/30 1044)  Labs:  Recent Labs  03/10/13 0635 03/10/13 1042  03/11/13 0500 03/11/13 1610 03/12/13 0109 03/12/13 0420 03/12/13 0950  HGB 15.7  --   --  15.0  --   --  14.8  --   HCT 44.0  --   --  43.3  --   --  44.9  --   PLT 168  --   --  160  --   --  177  --   LABPROT 14.4  --   --  15.3*  --   --  14.3  --   INR 1.14  --   --  1.23  --   --  1.13  --   HEPARINUNFRC 0.25*  --   < > 0.91* 0.92* 0.54  --  0.33  CREATININE  --  1.23  --  1.12  --   --  1.32  --   < > = values in this interval not displayed.  Estimated Creatinine Clearance: 54.7 ml/min (by C-G formula based on Cr of 1.32).   Medications:  Scheduled:  . antiseptic oral rinse  15 mL Mouth Rinse BID  . aspirin EC  81 mg Oral Daily  . calcium carbonate  1 tablet Oral Q breakfast  . clopidogrel  75 mg Oral Daily  . [START ON 03/13/2013] digoxin  0.25 mg Intravenous Daily  . digoxin  0.5 mg Intravenous Once  . diltiazem  120 mg Oral Daily  . diltiazem  240 mg Oral QHS  . [COMPLETED] furosemide  20 mg Oral Once  . furosemide  40 mg Oral Daily  . Gerhardt's butt cream   Topical BID  . insulin aspart  0-5 Units Subcutaneous QHS  . insulin aspart  0-9 Units  Subcutaneous TID WC  . isosorbide mononitrate  30 mg Oral Q1500  . isosorbide mononitrate  60 mg Oral Daily  . levothyroxine  150 mcg Oral QAC breakfast  . metoprolol tartrate  37.5 mg Oral BID  . omega-3 acid ethyl esters  1 g Oral BID  . pantoprazole  40 mg Oral Q1200  . potassium chloride  20 mEq Oral BID  . predniSONE  5 mg Oral Q breakfast  . Saxagliptin-Metformin  1 tablet Oral BID  . [COMPLETED] warfarin  7.5 mg Oral ONCE-1800  . Warfarin - Pharmacist Dosing Inpatient   Does not apply q1800   Infusions:  . sodium chloride 10 mL/hr at 03/09/13 1900  . heparin 1,400 Units/hr (03/11/13 2338)  . [DISCONTINUED] heparin Stopped (03/11/13 1814)    Assessment: 72 yo male on heparin for CP in the setting of known CAD, pulmonary issues and atrial fibrillation. Patient was on Coumadin PTA (home dose:  5mg  MWF and 7.5mg  TTSS) for atrial fibrillation. This was held on admission and was resumed on 3/27.   INR today remains subtherapeutic. Dose will be increased today. HL is therapeutic. CBC and renal function are stable. No bleeding noted.   Goal of Therapy:  INR 2-3 Heparin level 0.3-0.7 units/ml Monitor platelets by anticoagulation protocol: Yes   Plan:  - Continue heparin infusion at 1400 units/hr  - Follow up heparin level tomorrow morning and daily  - Coumadin 10mg  x1 tonight  - Follow up daily INR  - Continue to monitor H&H and platelets  Lillia Pauls, PharmD Clinical Pharmacist Pager: [phone] Phone: [phone] 03/12/2013 10:49 AM

## 2013-03-13 LAB — PROTIME-INR: INR: 1.11 (ref 0.00–1.49)

## 2013-03-13 LAB — BASIC METABOLIC PANEL
Calcium: 9.6 mg/dL (ref 8.4–10.5)
GFR calc Af Amer: 54 mL/min — ABNORMAL LOW (ref >90)
GFR calc non Af Amer: 46 mL/min — ABNORMAL LOW (ref >90)
Sodium: 136 mEq/L (ref 135–145)

## 2013-03-13 LAB — CBC
HCT: 45.3 % (ref 39.0–52.0)
Platelets: 172 10*3/uL (ref 150–400)
RDW: 16.5 % — ABNORMAL HIGH (ref 11.5–15.5)
WBC: 12.1 10*3/uL — ABNORMAL HIGH (ref 4.0–10.5)

## 2013-03-13 LAB — POTASSIUM: Potassium: 5.3 mEq/L — ABNORMAL HIGH (ref 3.5–5.1)

## 2013-03-13 LAB — GLUCOSE, CAPILLARY

## 2013-03-13 LAB — HEPARIN LEVEL (UNFRACTIONATED): Heparin Unfractionated: 0.42 IU/mL (ref 0.30–0.70)

## 2013-03-13 MED ORDER — METOPROLOL TARTRATE 25 MG PO TABS
25.0000 mg | ORAL_TABLET | Freq: Every day | ORAL | Status: DC
  Administered 2013-03-13: 25 mg via ORAL
  Filled 2013-03-13 (×2): qty 1

## 2013-03-13 MED ORDER — METOPROLOL TARTRATE 25 MG PO TABS
37.5000 mg | ORAL_TABLET | Freq: Every day | ORAL | Status: DC
  Administered 2013-03-13 – 2013-03-14 (×2): 37.5 mg via ORAL
  Filled 2013-03-13 (×2): qty 1

## 2013-03-13 MED ORDER — FUROSEMIDE 40 MG PO TABS: 40.0000 mg | ORAL_TABLET | Freq: Every day | ORAL | Status: DC

## 2013-03-13 MED ORDER — WARFARIN SODIUM 10 MG PO TABS
10.0000 mg | ORAL_TABLET | Freq: Once | ORAL | Status: AC
  Administered 2013-03-13: 10 mg via ORAL
  Filled 2013-03-13 (×2): qty 1

## 2013-03-13 MED ORDER — DIGOXIN 125 MCG PO TABS
0.1250 mg | ORAL_TABLET | Freq: Every day | ORAL | Status: DC
  Administered 2013-03-14: 0.125 mg via ORAL
  Filled 2013-03-13: qty 1

## 2013-03-13 NOTE — Progress Notes (Signed)
Physical Therapy Treatment Patient Details Name: [name] MRN: [id number] DOB: 03-30-1941 Today's Date: 03/13/2013 Time: 4098-1191 PT Time Calculation (min): 24 min  PT Assessment / Plan / Recommendation Comments on Treatment Session  Pt adm with CHF.  Pt making steady progress with mobility.    Follow Up Recommendations  Home health PT;Supervision/Assistance - 24 hour     Does the patient have the potential to tolerate intense rehabilitation     Barriers to Discharge        Equipment Recommendations  None recommended by PT    Recommendations for Other Services    Frequency Min 3X/week   Plan Discharge plan remains appropriate;Frequency remains appropriate    Precautions / Restrictions Precautions Precautions: Fall   Pertinent Vitals/Pain SaO2 95% on RA after amb    Mobility  Bed Mobility Supine to Sit: 5: Supervision;HOB elevated Sit to Supine: 5: Supervision Details for Bed Mobility Assistance: Incr time Transfers Sit to Stand: 4: Min guard;With upper extremity assist;From bed Stand to Sit: 4: Min guard;With upper extremity assist;With armrests;To chair/3-in-1 Ambulation/Gait Ambulation/Gait Assistance: 4: Min guard Ambulation Distance (Feet): 400 Feet Assistive device: Straight cane Ambulation/Gait Assistance Details: Slightly unsteady but no loss of balance.  1 standing rest break. Gait Pattern: Step-through pattern;Decreased step length - right;Decreased step length - left Gait velocity: slow    Exercises     PT Diagnosis:    PT Problem List:   PT Treatment Interventions:     PT Goals Acute Rehab PT Goals PT Goal: Sit to Stand - Progress: Progressing toward goal PT Goal: Stand to Sit - Progress: Progressing toward goal PT Goal: Ambulate - Progress: Progressing toward goal  Visit Information  Last PT Received On: 03/13/13 Assistance Needed: +1    Subjective Data  Subjective: Pt states he feels weak.   Cognition  Cognition Overall  Cognitive Status: Appears within functional limits for tasks assessed/performed Arousal/Alertness: Awake/alert Orientation Level: Appears intact for tasks assessed Behavior During Session: [facility] for tasks performed    Balance  Balance Balance Assessed: Yes Static Standing Balance Static Standing - Balance Support: Right upper extremity supported Static Standing - Level of Assistance: 5: Stand by assistance  End of Session PT - End of Session Equipment Utilized During Treatment: Gait belt Activity Tolerance: Patient tolerated treatment well Patient left: in chair;with call bell/phone within reach;with family/visitor present Nurse Communication: Mobility status   GP     Verlene Glantz 03/13/2013, 1:44 PM  [employer] PT [phone]

## 2013-03-13 NOTE — Progress Notes (Signed)
In to speak with pt. And spouse about changing medication regimen.  Pt. Currently on Coumadin for blood thinner and Dr. Herbie Baltimore would like to change pt. To Xarelto or Eliquis.  This NCM ordered a benefits check for both medications.  Pt. Concerned about financial indications for both medications.  NCM will be able to supply co-pay cards for both medications as well as 30 day free cards.  NCM will await insurance copay amounts.  Pt. Uses [facility] in [place]. Tera Mater, RN, BSN NCM [phone]

## 2013-03-13 NOTE — Progress Notes (Addendum)
Call to Nada Boozer PA notified of K redraw results 5.3 trending down. No orders received.

## 2013-03-13 NOTE — Progress Notes (Signed)
Subjective: No chest pain over night.  No SOB.  Objective: Vital signs in last 24 hours: Temp:  [97.2 F (36.2 C)-97.5 F (36.4 C)] 97.5 F (36.4 C) (03/31 0654) Pulse Rate:  [44-98] 70 (03/31 0954) Resp:  [18-20] 18 (03/31 0654) BP: (105-112)/(68-80) 112/68 mmHg (03/31 1001) SpO2:  [97 %-98 %] 98 % (03/31 0654) Weight:  [87.5 kg (192 lb 14.4 oz)] 87.5 kg (192 lb 14.4 oz) (03/31 0654) Weight change: 0.137 kg (4.8 oz) Last BM Date: 03/12/13 Intake/Output from previous day: -865  Wt 87.5 ; 192 pounds 03/30 0701 - 03/31 0700 In: 1320 [P.O.:1320] Out: 1825 [Urine:1825] Intake/Output this shift:    PE: General:alert and oriented, just back from walk with PT no chest pain Neck:no JVD Heart:irreg irreg, rate with improved control without episodes of tachycardia Lungs:fairly clear, no rales or wheezes.  On lasix 40 po once daily. Abd:+ BS, soft, non tender Ext:no edema     Lab Results:  Recent Labs  03/12/13 0420 03/13/13 0435  WBC 10.5 12.1*  HGB 14.8 15.6  HCT 44.9 45.3  PLT 177 172   BMET  Recent Labs  03/12/13 0420 03/13/13 0435  NA 133* 136  K 4.8 5.6*  CL 98 98  CO2 25 28  GLUCOSE 145* 125*  BUN 28* 28*  CREATININE 1.32 1.45*  CALCIUM 9.5 9.6   No results found for this basename: TROPONINI, CK, MB,  in the last 72 hours  Lab Results  Component Value Date   CHOL 197 03/09/2013   HDL 42 03/09/2013   LDLCALC 115* 03/09/2013   TRIG 198* 03/09/2013   CHOLHDL 4.7 03/09/2013   Lab Results  Component Value Date   HGBA1C 7.2* 01/14/2013     Lab Results  Component Value Date   TSH 0.832 03/08/2013    Hepatic Function Panel   Studies/Results: No results found.  Medications: I have reviewed the patient's current medications. Scheduled Meds: . antiseptic oral rinse  15 mL Mouth Rinse BID  . aspirin EC  81 mg Oral Daily  . calcium carbonate  1 tablet Oral Q breakfast  . clopidogrel  75 mg Oral Daily  . digoxin  0.25 mg Oral Daily  . diltiazem  120 mg  Oral Daily  . diltiazem  240 mg Oral QHS  . furosemide  40 mg Oral Daily  . Gerhardt's butt cream   Topical BID  . insulin aspart  0-5 Units Subcutaneous QHS  . insulin aspart  0-9 Units Subcutaneous TID WC  . isosorbide mononitrate  30 mg Oral Q1500  . isosorbide mononitrate  60 mg Oral Daily  . levothyroxine  150 mcg Oral QAC breakfast  . linagliptin  5 mg Oral Daily  . metFORMIN  1,000 mg Oral BID WC  . metoprolol tartrate  37.5 mg Oral BID  . omega-3 acid ethyl esters  1 g Oral BID  . pantoprazole  40 mg Oral Q1200  . potassium chloride  20 mEq Oral BID  . predniSONE  5 mg Oral Q breakfast  . warfarin  10 mg Oral ONCE-1800  . Warfarin - Pharmacist Dosing Inpatient   Does not apply q1800   Continuous Infusions: . sodium chloride 250 mL (03/12/13 1758)  . 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  K+ level increased and rise in Cr. Will stop K+ for today ? Kayexalate  INR still not therapeutic  On Heparin/coumadin crossover.       Glucose stable  PT ambulating.   LOS: 5 days   Time spent with pt. :15 minutes. [name],LAURA R 03/13/2013, 11:17 AM  I have seen and evaluated the patient this PM along with Nada Boozer, NP. I agree with her findings, examination as well as impression recommendations.  He looks & feels good today.  HR much better, but did have some drops to 40s o/n --> will continue Dig & decrease  PM BB dose to 25mg .  They are all very excited about how well he has been doing ambulating.  Slight upward trend in Cr with K up -- hold K supplement & hold AM dose of Lasix (? If we "overshot some with diuresis") -- > PM K+ 5.3   He has not had any further angina or HF Sx.  Currently the main "hold-up" is lack of movement of INR.  I talked with the patient & family & have asked CM to assist with financial details of either Xarelto or Eliquis as opposed to warfarin.  At any rate, we he may be able to get 1st month covered & co-pay card my help.  If this proves a viable option, we can convert to a NOAC & d/c to home after 2nd dose.  DM - stable    [name], M.D., M.S. [facility] [address] [place], [place]  [address]  [phone] Pager # [phone] 03/13/2013 5:18 PM

## 2013-03-13 NOTE — Progress Notes (Signed)
ANTICOAGULATION CONSULT NOTE - Follow Up Consult  Pharmacy Consult for heparin/Coumadin Indication: chest pain/ACS and atrial fibrillation  Allergies  Allergen Reactions  . Clarithromycin Other (See Comments)    Unknown, thinks "blisters in mouth"  . Influenza Vaccines Other (See Comments)    Severe coughing for weeks after vaccine; also had flu like symptoms for about 7 to 8 weeks.   . Lisinopril Other (See Comments)    cough  . Ranexa (Ranolazine Er)   . Statins    Patient Measurements: Height: 6' (182.9 cm) Weight: 192 lb 14.4 oz (87.5 kg) (scale B) IBW/kg (Calculated) : 77.6 Heparin Dosing Weight: 87.4 kg  Vital Signs: Temp: 97.5 F (36.4 C) (03/31 0654) Temp src: Oral (03/31 0654) BP: 109/80 mmHg (03/31 0654) Pulse Rate: 78 (03/31 0654)  Labs:  Recent Labs  03/11/13 0500  03/12/13 0109 03/12/13 0420 03/12/13 0950 03/13/13 0435  HGB 15.0  --   --  14.8  --  15.6  HCT 43.3  --   --  44.9  --  45.3  PLT 160  --   --  177  --  172  LABPROT 15.3*  --   --  14.3  --  14.2  INR 1.23  --   --  1.13  --  1.11  HEPARINUNFRC 0.91*  < > 0.54  --  0.33 0.42  CREATININE 1.12  --   --  1.32  --  1.45*  < > = values in this interval not displayed.  Estimated Creatinine Clearance: 49.8 ml/min (by C-G formula based on Cr of 1.45).  Medications:  Scheduled:  . antiseptic oral rinse  15 mL Mouth Rinse BID  . aspirin EC  81 mg Oral Daily  . calcium carbonate  1 tablet Oral Q breakfast  . clopidogrel  75 mg Oral Daily  . [COMPLETED] digoxin  0.5 mg Intravenous Once  . digoxin  0.25 mg Oral Daily  . diltiazem  120 mg Oral Daily  . diltiazem  240 mg Oral QHS  . furosemide  40 mg Oral Daily  . Gerhardt's butt cream   Topical BID  . insulin aspart  0-5 Units Subcutaneous QHS  . insulin aspart  0-9 Units Subcutaneous TID WC  . isosorbide mononitrate  30 mg Oral Q1500  . isosorbide mononitrate  60 mg Oral Daily  . levothyroxine  150 mcg Oral QAC breakfast  . linagliptin  5  mg Oral Daily  . metFORMIN  1,000 mg Oral BID WC  . metoprolol tartrate  37.5 mg Oral BID  . omega-3 acid ethyl esters  1 g Oral BID  . pantoprazole  40 mg Oral Q1200  . potassium chloride  20 mEq Oral BID  . predniSONE  5 mg Oral Q breakfast  . [COMPLETED] warfarin  10 mg Oral ONCE-1800  . Warfarin - Pharmacist Dosing Inpatient   Does not apply q1800  . [DISCONTINUED] digoxin  0.25 mg Intravenous Daily  . [DISCONTINUED] Saxagliptin-Metformin  1 tablet Oral BID   Infusions:  . sodium chloride 250 mL (03/12/13 1758)  . heparin 1,400 Units/hr (03/12/13 1757)   Assessment: 72 yo male on heparin for CP in the setting of known CAD, pulmonary issues and atrial fibrillation. Patient was on Coumadin PTA (home dose: 5mg  MWF and 7.5mg  TTSS) for atrial fibrillation. This was held on admission and was resumed on 3/27.   INR today remains subtherapeutic. He is obviously a slow responder to his therapy but would have expected INR to  be trending upward. Doses reviewed and they have been charted as given.   His heparin level is within desired range and CBC,renal function are all stable. No bleeding noted.   Goal of Therapy:  INR 2-3 Heparin level 0.3-0.7 units/ml Monitor platelets by anticoagulation protocol: Yes   Plan:  - Continue heparin infusion at 1400 units/hr  - Follow up heparin level tomorrow morning and daily  - Coumadin 10mg  x1 tonight  - Follow up daily INR  - Continue to monitor H&H and platelets  Nadara Mustard, PharmD., MS Clinical Pharmacist Pager:  [phone] Thank you for allowing pharmacy to be part of this patients care team. 03/13/2013 8:26 AM

## 2013-03-13 NOTE — Progress Notes (Signed)
CARDIAC REHAB PHASE I   PRE:  Rate/Rhythm: 83afib  BP:  Supine: 123/87  Sitting:   Standing:    SaO2: 96%RA  MODE:  Ambulation: 460 ft   POST:  Rate/Rhythm: 117afib  BP:  Supine: 144/91  Sitting:   Standing:    SaO2: 97%RA 1415-1504 Pt walked 460 ft on RA with his cane and asst x 1 with fairly steady gait. Tolerated well except for feeling a little sick after walk while lying in bed. Discussed CRP 2 but pt and family stated not appropriate due to pt's sudden incontinence. Pt at times has no control of bowels. Has MI booklet at home. Would benefit from [facility] PT.   Luetta Nutting, RN BSN  03/13/2013 3:00 PM   83AFIB

## 2013-03-14 LAB — CULTURE, BLOOD (ROUTINE X 2): Culture: NO GROWTH

## 2013-03-14 LAB — DIGOXIN LEVEL: Digoxin Level: 0.8 ng/mL (ref 0.8–2.0)

## 2013-03-14 LAB — PROTIME-INR: INR: 1.16 (ref 0.00–1.49)

## 2013-03-14 LAB — BASIC METABOLIC PANEL
Calcium: 10 mg/dL (ref 8.4–10.5)
Creatinine, Ser: 1.29 mg/dL (ref 0.50–1.35)
GFR calc non Af Amer: 53 mL/min — ABNORMAL LOW (ref >90)
Glucose, Bld: 116 mg/dL — ABNORMAL HIGH (ref 70–99)
Sodium: 134 mEq/L — ABNORMAL LOW (ref 135–145)

## 2013-03-14 LAB — CBC
Platelets: 167 10*3/uL (ref 150–400)
RDW: 16.2 % — ABNORMAL HIGH (ref 11.5–15.5)
WBC: 13.2 10*3/uL — ABNORMAL HIGH (ref 4.0–10.5)

## 2013-03-14 LAB — GLUCOSE, CAPILLARY: Glucose-Capillary: 133 mg/dL — ABNORMAL HIGH (ref 70–99)

## 2013-03-14 LAB — HEPARIN LEVEL (UNFRACTIONATED): Heparin Unfractionated: 0.46 IU/mL (ref 0.30–0.70)

## 2013-03-14 MED ORDER — DIGOXIN 125 MCG PO TABS: 0.1250 mg | ORAL_TABLET | Freq: Every day | ORAL | Status: DC

## 2013-03-14 MED ORDER — DILTIAZEM HCL ER COATED BEADS 240 MG PO CP24: 240.0000 mg | ORAL_CAPSULE | Freq: Every day | ORAL | Status: DC

## 2013-03-14 MED ORDER — RIVAROXABAN 20 MG PO TABS: 20.0000 mg | ORAL_TABLET | Freq: Every day | ORAL | Status: DC

## 2013-03-14 MED ORDER — FUROSEMIDE 40 MG PO TABS: 40.0000 mg | ORAL_TABLET | Freq: Every day | ORAL | Status: DC

## 2013-03-14 MED ORDER — RIVAROXABAN 20 MG PO TABS
20.0000 mg | ORAL_TABLET | Freq: Every day | ORAL | Status: DC
  Administered 2013-03-14: 20 mg via ORAL
  Filled 2013-03-14: qty 1

## 2013-03-14 MED ORDER — WARFARIN SODIUM 2.5 MG PO TABS
12.5000 mg | ORAL_TABLET | Freq: Once | ORAL | Status: DC
  Filled 2013-03-14: qty 1

## 2013-03-14 MED ORDER — METOPROLOL TARTRATE 25 MG PO TABS: 25.0000 mg | ORAL_TABLET | Freq: Every day | ORAL | Status: DC

## 2013-03-14 MED ORDER — DILTIAZEM HCL ER COATED BEADS 120 MG PO CP24: 120.0000 mg | ORAL_CAPSULE | Freq: Every day | ORAL | Status: DC

## 2013-03-14 MED ORDER — ISOSORBIDE MONONITRATE ER 30 MG PO TB24: 30.0000 mg | ORAL_TABLET | Freq: Every day | ORAL | Status: DC

## 2013-03-14 NOTE — Progress Notes (Signed)
Noted benefits check for Xarelto and Eliquis.  Xarelto needs a prior authorization for insurance to pay.  Eliquis will be $95/30 days for any pharmacy, $70/30days for [facility] or [facility].  Will speak with pt. About this new information.   Tera Mater, RN, BSN NCM [phone]

## 2013-03-14 NOTE — Progress Notes (Signed)
ANTICOAGULATION CONSULT NOTE - Follow Up Consult  Pharmacy Consult for heparin/Coumadin Indication: chest pain/ACS and atrial fibrillation  Allergies  Allergen Reactions  . Clarithromycin Other (See Comments)    Unknown, thinks "blisters in mouth"  . Influenza Vaccines Other (See Comments)    Severe coughing for weeks after vaccine; also had flu like symptoms for about 7 to 8 weeks.   . Lisinopril Other (See Comments)    cough  . Ranexa (Ranolazine Er)   . Statins    Patient Measurements: Height: 6' (182.9 cm) Weight: 190 lb 14.7 oz (86.6 kg) IBW/kg (Calculated) : 77.6 Heparin Dosing Weight: 87.4 kg  Vital Signs: Temp: 97.2 F (36.2 C) (04/01 0439) Temp src: Oral (04/01 0439) BP: 113/80 mmHg (04/01 0439) Pulse Rate: 54 (04/01 0439)  Labs:  Recent Labs  03/12/13 0420 03/12/13 0950 03/13/13 0435 03/14/13 0425  HGB 14.8  --  15.6 15.7  HCT 44.9  --  45.3 44.2  PLT 177  --  172 167  LABPROT 14.3  --  14.2 14.6  INR 1.13  --  1.11 1.16  HEPARINUNFRC  --  0.33 0.42 0.46  CREATININE 1.32  --  1.45* 1.29    Estimated Creatinine Clearance: 56 ml/min (by C-G formula based on Cr of 1.29).  Medications:  Scheduled:  . antiseptic oral rinse  15 mL Mouth Rinse BID  . aspirin EC  81 mg Oral Daily  . calcium carbonate  1 tablet Oral Q breakfast  . clopidogrel  75 mg Oral Daily  . digoxin  0.125 mg Oral Daily  . diltiazem  120 mg Oral Daily  . diltiazem  240 mg Oral QHS  . [START ON 03/15/2013] furosemide  40 mg Oral Daily  . Gerhardt's butt cream   Topical BID  . insulin aspart  0-5 Units Subcutaneous QHS  . insulin aspart  0-9 Units Subcutaneous TID WC  . isosorbide mononitrate  30 mg Oral Q1500  . isosorbide mononitrate  60 mg Oral Daily  . levothyroxine  150 mcg Oral QAC breakfast  . linagliptin  5 mg Oral Daily  . metFORMIN  1,000 mg Oral BID WC  . metoprolol tartrate  25 mg Oral QHS  . metoprolol tartrate  37.5 mg Oral Daily  . omega-3 acid ethyl esters  1 g  Oral BID  . pantoprazole  40 mg Oral Q1200  . predniSONE  5 mg Oral Q breakfast  . [COMPLETED] warfarin  10 mg Oral ONCE-1800  . Warfarin - Pharmacist Dosing Inpatient   Does not apply q1800  . [DISCONTINUED] digoxin  0.25 mg Oral Daily  . [DISCONTINUED] furosemide  40 mg Oral Daily  . [DISCONTINUED] metoprolol tartrate  37.5 mg Oral BID  . [DISCONTINUED] potassium chloride  20 mEq Oral BID   Infusions:  . sodium chloride 250 mL (03/12/13 1758)  . heparin 1,400 Units/hr (03/13/13 1342)   Assessment: 72 yo male on heparin for CP in the setting of known CAD, pulmonary issues and atrial fibrillation. Patient was on Coumadin PTA (home dose: 5mg  MWF and 7.5mg  TTSS) for atrial fibrillation. This was held on admission and was resumed on 3/27.   INR today remains subtherapeutic. He is obviously a slow responder to his therapy but would have expected INR to be trending upward. Doses reviewed and they have been charted as given.   His heparin level is within desired range and CBC,renal function are all stable. No bleeding noted. Noted potential plans to change from coumadin to alternative  anticoagulant - will continue to follow.  Goal of Therapy:  INR 2-3 Heparin level 0.3-0.7 units/ml Monitor platelets by anticoagulation protocol: Yes   Plan:  - Continue heparin infusion at 1400 units/hr  - Increase Coumadin to 12.5 mg  X 1 tonight  - F/u daily heparin level, CBC, INR   Raynie Steinhaus L. Illene Bolus, PharmD, BCPS Clinical Pharmacist Pager: [phone] Pharmacy: [phone] 03/14/2013 7:48 AM

## 2013-03-14 NOTE — Progress Notes (Signed)
03/14/13 1430 In to speak with pt. about choice of new anticoagulant medicine. Dr. Herbie Baltimore would like the pt. to start on Xarelto.  This NCM gave pt. a 10day free card and a co-pay card for Xarelto. Explained to pt. about how he would have to call telephone number on card to activate.  Pt. to dc home today. Tera Mater, RN, BSN NCM [phone]

## 2013-03-14 NOTE — Progress Notes (Signed)
Completed HF ed. Encouraged pt to walk as able at home with his cane. All questions answered. Gave HF booklet. Ethelda Chick CES, [place] [phone]

## 2013-03-14 NOTE — Progress Notes (Signed)
Subjective: No chest pain over night.  No SOB.  Objective: Vital signs in last 24 hours: Temp:  [97.2 F (36.2 C)-98 F (36.7 C)] 97.2 F (36.2 C) (04/01 0439) Pulse Rate:  [54-86] 58 (04/01 0931) Resp:  [16-18] 18 (03/31 2159) BP: (108-113)/(71-83) 112/78 mmHg (04/01 0928) SpO2:  [95 %-98 %] 96 % (04/01 0439) Weight:  [86.6 kg (190 lb 14.7 oz)] 86.6 kg (190 lb 14.7 oz) (04/01 0439) Weight change: -0.9 kg (-1 lb 15.8 oz) Last BM Date: 03/13/13 Intake/Output from previous day: -865  Wt 87.5 ; 192 pounds 03/31 0701 - 04/01 0700 In: 480 [P.O.:480] Out: 2350 [Urine:2350] Intake/Output this shift: Total I/O In: 240 [P.O.:240] Out: -   PE: General:alert and oriented, just back from walk with PT no chest pain Neck:no JVD Heart:irreg irreg, rate with improved control without episodes of tachycardia Lungs:fairly clear, no rales or wheezes.  On lasix 40 po once daily. Abd:+ BS, soft, non tender Ext:no edema     Lab Results:  Recent Labs  03/13/13 0435 03/14/13 0425  WBC 12.1* 13.2*  HGB 15.6 15.7  HCT 45.3 44.2  PLT 172 167   BMET  Recent Labs  03/13/13 0435 03/13/13 1250 03/14/13 0425  NA 136  --  134*  K 5.6* 5.3* 4.9  CL 98  --  98  CO2 28  --  25  GLUCOSE 125*  --  116*  BUN 28*  --  29*  CREATININE 1.45*  --  1.29  CALCIUM 9.6  --  10.0   No results found for this basename: TROPONINI, CK, MB,  in the last 72 hours  Lab Results  Component Value Date   CHOL 197 03/09/2013   HDL 42 03/09/2013   LDLCALC 115* 03/09/2013   TRIG 198* 03/09/2013   CHOLHDL 4.7 03/09/2013   Lab Results  Component Value Date   HGBA1C 7.2* 01/14/2013     Lab Results  Component Value Date   TSH 0.832 03/08/2013    Hepatic Function Panel   Studies/Results: No results found.  Medications: I have reviewed the patient's current medications. Scheduled Meds: . antiseptic oral rinse  15 mL Mouth Rinse BID  . aspirin EC  81 mg Oral Daily  . calcium carbonate  1 tablet Oral Q  breakfast  . clopidogrel  75 mg Oral Daily  . digoxin  0.125 mg Oral Daily  . diltiazem  120 mg Oral Daily  . diltiazem  240 mg Oral QHS  . [START ON 03/15/2013] furosemide  40 mg Oral Daily  . Gerhardt's butt cream   Topical BID  . insulin aspart  0-5 Units Subcutaneous QHS  . insulin aspart  0-9 Units Subcutaneous TID WC  . isosorbide mononitrate  30 mg Oral Q1500  . isosorbide mononitrate  60 mg Oral Daily  . levothyroxine  150 mcg Oral QAC breakfast  . linagliptin  5 mg Oral Daily  . metFORMIN  1,000 mg Oral BID WC  . metoprolol tartrate  25 mg Oral QHS  . metoprolol tartrate  37.5 mg Oral Daily  . omega-3 acid ethyl esters  1 g Oral BID  . pantoprazole  40 mg Oral Q1200  . predniSONE  5 mg Oral Q breakfast  . rivaroxaban  20 mg Oral Daily   Continuous Infusions: . sodium chloride 250 mL (03/12/13 1758)   PRN Meds:.acetaminophen, acetaminophen, ALPRAZolam, nitroGLYCERIN, nitroGLYCERIN, ondansetron (ZOFRAN) IV, zolpidem  Assessment/Plan: Principal Problem:   Acute on chronic systolic HF (heart failure) Active  Problems:   DYSLIPIDEMIA   CAD, CABG '98, cath 01/14/13- medical Rx   Atrial fibrillation, permanent, RVR at 170 on admit -now controlled with med adjustments   NSTEMI (non-ST elevated myocardial infarction), 03/08/13   Chronic anticoagulation, on coumadin   Cardiomyopathy, ischemic EF 25% at one time most recent cath EF 40-45% 01/2013   Diabetes mellitus  PLAN:  HR with improved control with dig.  K+ level increased and rise in Cr. Will stop K+ for today ? Kayexalate  INR still not therapeutic  On Heparin/coumadin crossover.       Glucose stable  PT ambulating.   LOS: 6 days   Time spent with pt. :15 minutes. Kino Dunsworth W 03/14/2013, 11:32 AM  I have seen and evaluated the patient this PM along with Nada Boozer, NP. I agree with her findings, examination as well as impression recommendations.  He looks & feels good today.  HR much better, but still with some  drops to 40s o/n --> will drop to 0.125 mg Dig & decrease PM BB dose to 25mg .  They are all very excited about how well he has been doing ambulating.  Slight upward trend in Cr with K up yesterday --> both improved today;    He has not had any further angina or HF Sx. --> change d/c Lasix to 40 mg daily; reviewed need for daily wgt check with Sliding Scale Lasix (if >3lb above dry wgt,  Currently the main "hold-up" is lack of movement of INR.  I have discussed options with our [facility] clinical pharmacist Phylis Bougie); we will d/c Coumadin, dose with Xarelto today, with plan to try Xarelto for 1-2 months & allow time for pt & family to figure out financial ramifications.  If CM can provide 1st month - free coupon & co-pay card, that would be appreciated.  Baxter Hire has some samples of Xarelto as well as Digoxin at our [facility] clinic that the patient's son will pick up. Making this conversion will allow for d/c today.  Has Anticoagulation f/u with Kristen on Thursday, will also add onto Mr. Corine Shelter, PA's schedule that day as well as part of TCM discharge.  DM - stable    Tanija Germani W, M.D., M.S. [facility] [address] [place], [place]  [address]  [phone] Pager # [phone] 03/14/2013 11:32 AM

## 2013-03-14 NOTE — Discharge Summary (Signed)
Physician Discharge Summary  Patient ID: [name] MRN: [id number] DOB/AGE: [birth year] 72 y.o.  Admit date: 03/08/2013 Discharge date: 03/14/2013  Admission Diagnoses:  Acute on Chronic Systolic HF,  Atrial Fib with RVR  Discharge Diagnoses:  Principal Problem:   Acute on chronic systolic HF (heart failure) Active Problems:   DYSLIPIDEMIA   CAD, CABG '98, cath 01/14/13- medical Rx   Atrial fibrillation, permanent, RVR at 170 on admit -now controlled with med adjustments   NSTEMI (non-ST elevated myocardial infarction), 03/08/13   Chronic anticoagulation, on coumadin   Cardiomyopathy, ischemic EF 25% at one time most recent cath EF 40-45% 01/2013   Diabetes mellitus   Discharged Condition: stable  Hospital Course:   72 year old mildly overweight married Caucasian male father of 3, grandfather to 6 grandchildren. 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 He had a LIMA to his LAD and diagonal branch sequentially, a vein to the 1st and 2nd marginal branches sequentially and to the acute marginal branch and PDA. He has moderate LV dysfunction, chronic atrial fibrillation, rate controlled on Coumadin anticoagulation. He was referred to Dr. Hillis Range for consideration of atrial fibrillation ablation but he thought he was not a good candidate for this. His major complaints are of chronic shortness of breath and chest pain which is nitro-responsive. 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 He does have a chronic dyspnea and a known paralyzed left hemidiaphragm. He was cathed April 03, 2011 revealing a patent vein to an OM, a patent sequential LIMA to LAD and D1, and a vein to the PDA which had a 75% insertion stenosis and a 95% stenosis in the PDA beyond vein graft insertion. He also had a 95% AV groove stenosis which supplied  a small posterolateral branch. His cath EF was 20-25%, although by 2D one month prior it was 45%. He has taken Ranexa in the past but says it didn't help his angina.   In Feb. 2014 he was admitted with chest pain and underwent cardiac cath revealing notable progression in SVG-PDA with disease in the native vessel after the graft. It was felt this was not amenable to PCI and the plan is for medical Rx. His medications were adjusted and Dr Royann Shivers suggested we try Ranexa 500mg  daily- if he has recurrent symptoms. His EF was noted to be depressed by cath 40-45%, by echo 45-50%- but in reviewing his records its noted that his EF has been labile- from 55%-25%. He has severe lung disease and Dr Royann Shivers did not feel he was a candidate for an ICD. Pt's lasix was stopped 01/30/13 due to hypotension and weakness. BP at that time was in the 80's. His family report that his wt. Has been stable though he has continued to loose weight with poor appetite.   He presented with chest pain, SOB and his chronic A. Fib with RVR. Pt had been doing really well since last office visit, ambulating, though he had 2 falls recently secondary to weakness and unsteadiness. The day before admission he developed acute SOB at 0100 AM, and waited it out with improvement during the day. Then again was awakened from sleep around 0100 with acute SOB, then developed significant chest pain. He took total of 3 NTG sl and after 15-20 min. the pain and SOB improved somewhat. But still present so  his family brought him to the ER. His HR was 170's and CXR revealed CHF. He rec'd 15 mg IV cardizem, now on drip at 10 with HR of 108. He has also rec'd 80 mg IV lasix.   He was admitted for IV diuresis and rate control.  Digoxin was loaded.  BNP decreased from 12,639 to 2340 with net fluids of negative 13 liters over the course of his hospitalization.  IV diltiazem was converted to PO.  BP meds were titrated down secondary to fatigue.  Diltiazem was split into  two doses to avoid hypotension.  Potassium was held at one point due to hyperkalemia.  Warfarin was restarted, however, it was eventually decided to change to Xarelto.  Samples were supplied from [facility].  CXR showed Minimal left pleural effusion. No other acute abnormality seen in the chest. Follow up was arranged with Corine Shelter at [facility].  The patient was discharged home in stale condition after being seen by Dr. Herbie Baltimore.     Greater than 45 minutes was spent preparing this complicated TCM patient for discharge.    Consults: None  Significant Diagnostic Studies:  CHEST - 2 VIEW  Comparison: March 08, 2013.  Findings: Stable cardiomegaly. No pneumothorax is noted. Minimal  left pleural effusion is noted. Sternotomy wires are noted. No  acute pulmonary disease is noted.  IMPRESSION:  Minimal left pleural effusion. No other acute abnormality seen in  the chest.  CBC    Component Value Date/Time   WBC 13.2* 03/14/2013 0425   RBC 4.66 03/14/2013 0425   HGB 15.7 03/14/2013 0425   HCT 44.2 03/14/2013 0425   PLT 167 03/14/2013 0425   MCV 94.8 03/14/2013 0425   MCH 33.7 03/14/2013 0425   MCHC 35.5 03/14/2013 0425   RDW 16.2* 03/14/2013 0425   LYMPHSABS 0.9 05/28/2010 1000   MONOABS 0.6 05/28/2010 1000   EOSABS 0.1 05/28/2010 1000   BASOSABS 0.0 05/28/2010 1000    BMET    Component Value Date/Time   NA 134* 03/14/2013 0425   K 4.9 03/14/2013 0425   CL 98 03/14/2013 0425   CO2 25 03/14/2013 0425   GLUCOSE 116* 03/14/2013 0425   BUN 29* 03/14/2013 0425   CREATININE 1.29 03/14/2013 0425   CALCIUM 10.0 03/14/2013 0425   GFRNONAA 53* 03/14/2013 0425   GFRAA 62* 03/14/2013 0425    Treatments: Diltiazem, IV lasix, Lopressor, Digoxin.  Discharge Exam: Blood pressure 112/78, pulse 58, temperature 97.2 F (36.2 C), temperature source Oral, resp. rate 18, height 6' (1.829 m), weight 86.6 kg (190 lb 14.7 oz), SpO2 96.00%.   Disposition: 01-Home or Self Care  Discharge Orders   Future Orders Complete By Expires     Diet  - low sodium heart healthy  As directed     Increase activity slowly  As directed         Medication List    STOP taking these medications       diltiazem 180 MG 24 hr capsule  Commonly known as:  DILACOR XR     warfarin 5 MG tablet  Commonly known as:  COUMADIN      TAKE these medications       acetaminophen 650 MG CR tablet  Commonly known as:  TYLENOL  Take 650 mg by mouth every 8 (eight) hours as needed. For pain     aspirin 81 MG tablet  Take 81 mg by mouth daily.     calcium carbonate 600 MG Tabs  Commonly known  as:  OS-CAL  Take 600 mg by mouth daily.     clopidogrel 75 MG tablet  Commonly known as:  PLAVIX  Take 75 mg by mouth daily.     digoxin 0.125 MG tablet  Commonly known as:  LANOXIN  Take 1 tablet (0.125 mg total) by mouth daily.     diltiazem 120 MG 24 hr capsule  Commonly known as:  CARDIZEM CD  Take 1 capsule (120 mg total) by mouth daily.     diltiazem 240 MG 24 hr capsule  Commonly known as:  CARDIZEM CD  Take 1 capsule (240 mg total) by mouth at bedtime.     Fish Oil 1000 MG Caps  Take 3,000 mg by mouth every morning.     fluticasone 50 MCG/ACT nasal spray  Commonly known as:  FLONASE  Place 2 sprays into both nostrils daily.     furosemide 40 MG tablet  Commonly known as:  LASIX  Take 1 tablet (40 mg total) by mouth daily.  Start taking on:  03/15/2013     isosorbide mononitrate 60 MG 24 hr tablet  Commonly known as:  IMDUR  Take 60 mg by mouth daily.     KOMBIGLYZE XR 2.04-999 MG Tb24  Generic drug:  Saxagliptin-Metformin  Take 1 tablet by mouth 2 (two) times daily.     levothyroxine 150 MCG tablet  Commonly known as:  SYNTHROID, LEVOTHROID  Take 150 mcg by mouth daily.     metoprolol tartrate 25 MG tablet  Commonly known as:  LOPRESSOR  Take 1-1.5 tablets (25-37.5 mg total) by mouth daily.     nitroGLYCERIN 0.4 MG SL tablet  Commonly known as:  NITROSTAT  Place 0.4 mg under the tongue every 5 (five) minutes as needed.      omeprazole 20 MG capsule  Commonly known as:  PRILOSEC  Take 20 mg by mouth daily.     predniSONE 5 MG tablet  Commonly known as:  DELTASONE  Take 5 mg by mouth daily.     Rivaroxaban 20 MG Tabs  Commonly known as:  XARELTO  Take 1 tablet (20 mg total) by mouth daily.           Follow-up Information   Follow up with Abelino Derrick, PA-C On 03/16/2013. (11:20)    Contact information:   [address] [place] [address] [phone]       Signed: Wilburt Finlay 03/14/2013, 2:26 PM

## 2013-03-15 NOTE — Discharge Summary (Signed)
I saw & examined the patient on the day of discharge. He was essentially euvolemic with adequate HR control.  Converted to Xarelto for Afib anticoagulation -- this will allow for d/c & close OP f/u.  TCM discharge.  Marykay Lex, M.D., M.S. [facility] [address] [place], [place]  [address]  [phone] Pager # [phone] 03/15/2013 2:03 PM

## 2013-03-16 ENCOUNTER — Encounter: Payer: Self-pay | Admitting: Cardiology

## 2013-04-13 ENCOUNTER — Ambulatory Visit (INDEPENDENT_AMBULATORY_CARE_PROVIDER_SITE_OTHER): Payer: Medicare Other | Admitting: General Surgery

## 2013-04-13 ENCOUNTER — Encounter (INDEPENDENT_AMBULATORY_CARE_PROVIDER_SITE_OTHER): Payer: Self-pay | Admitting: General Surgery

## 2013-04-13 VITALS — BP 120/70 | HR 72 | Temp 97.8°F | Resp 20 | Ht 72.0 in | Wt 200.2 lb

## 2013-04-13 DIAGNOSIS — K649 Unspecified hemorrhoids: Secondary | ICD-10-CM

## 2013-04-13 NOTE — Progress Notes (Signed)
Subjective:     Patient ID: [name], male   DOB: 1941/10/03, 72 y.o.   MRN: [id number]  HPI The patient is a 72 year old male who was referred by Dr. Thea Silversmith secondary to bleeding hemorrhoids. The patient has a complicated medical history including congestive heart failure of anticoagulation. The patient was recently released from the hospital secondary to an MI the beginning of April.  The patient states he has a long history of hemorrhoid issues and pain. The patient is done with some blood in the toilet has blood on his dependence. In speaking with his son and wife the patient may have a component of either incontinence and/or rectal prolapse.  Review of Systems  Constitutional: Negative.   HENT: Negative.   Respiratory: Negative.   Cardiovascular: Negative.   Gastrointestinal: Positive for blood in stool and rectal pain.  Endocrine: Negative.   Musculoskeletal: Negative.   Skin: Negative.   Neurological: Negative.        Objective:   Physical Exam  Constitutional: He is oriented to person, place, and time. He appears well-developed and well-nourished.  HENT:  Head: Normocephalic and atraumatic.  Eyes: Conjunctivae and EOM are normal. Pupils are equal, round, and reactive to light.  Neck: Normal range of motion. Neck supple.  Cardiovascular: Normal rate and normal heart sounds.   Pulmonary/Chest: Effort normal and breath sounds normal.  Abdominal: Soft. Bowel sounds are normal.  Genitourinary: Rectal exam shows external hemorrhoid and internal hemorrhoid.     Musculoskeletal: Normal range of motion.  Neurological: He is alert and oriented to person, place, and time.       Assessment:     The patient is a 72 year old male with hemorrhoids.     Plan:     1. We discussed the fact to increase fiber the patient currently takes fiber once a day. I spoke with the wife and asked her to increase to 2 pills twice a day. I believe the patient's current anticoagulation  and antiplatelet therapy this is contributing to his bloody hemorrhoids. 2. Outpatient follow up with Dr. Maisie Fus for evaluation of possible prolapse, and incontinence issues as well as his hemorrhoids.

## 2013-04-28 ENCOUNTER — Encounter: Payer: Self-pay | Admitting: Cardiovascular Disease

## 2013-04-28 ENCOUNTER — Encounter (INDEPENDENT_AMBULATORY_CARE_PROVIDER_SITE_OTHER): Payer: Self-pay | Admitting: General Surgery

## 2013-04-28 ENCOUNTER — Ambulatory Visit (INDEPENDENT_AMBULATORY_CARE_PROVIDER_SITE_OTHER): Payer: Medicare Other | Admitting: General Surgery

## 2013-04-28 ENCOUNTER — Ambulatory Visit (INDEPENDENT_AMBULATORY_CARE_PROVIDER_SITE_OTHER): Payer: Medicare Other | Admitting: Cardiology

## 2013-04-28 ENCOUNTER — Encounter: Payer: Self-pay | Admitting: Cardiology

## 2013-04-28 ENCOUNTER — Ambulatory Visit (INDEPENDENT_AMBULATORY_CARE_PROVIDER_SITE_OTHER): Payer: Medicare Other | Admitting: Pharmacist Clinician (PhC)/ Clinical Pharmacy Specialist

## 2013-04-28 VITALS — BP 108/72 | HR 64 | Ht 72.0 in | Wt 182.2 lb

## 2013-04-28 VITALS — BP 132/78 | HR 64 | Resp 22 | Ht 72.0 in | Wt 200.8 lb

## 2013-04-28 DIAGNOSIS — Z7901 Long term (current) use of anticoagulants: Secondary | ICD-10-CM

## 2013-04-28 DIAGNOSIS — I259 Chronic ischemic heart disease, unspecified: Secondary | ICD-10-CM

## 2013-04-28 DIAGNOSIS — R630 Anorexia: Secondary | ICD-10-CM

## 2013-04-28 DIAGNOSIS — I4821 Permanent atrial fibrillation: Secondary | ICD-10-CM

## 2013-04-28 DIAGNOSIS — I4891 Unspecified atrial fibrillation: Secondary | ICD-10-CM

## 2013-04-28 DIAGNOSIS — I2510 Atherosclerotic heart disease of native coronary artery without angina pectoris: Secondary | ICD-10-CM

## 2013-04-28 DIAGNOSIS — I2589 Other forms of chronic ischemic heart disease: Secondary | ICD-10-CM

## 2013-04-28 DIAGNOSIS — K644 Residual hemorrhoidal skin tags: Secondary | ICD-10-CM

## 2013-04-28 DIAGNOSIS — I214 Non-ST elevation (NSTEMI) myocardial infarction: Secondary | ICD-10-CM

## 2013-04-28 DIAGNOSIS — E785 Hyperlipidemia, unspecified: Secondary | ICD-10-CM

## 2013-04-28 DIAGNOSIS — K648 Other hemorrhoids: Secondary | ICD-10-CM

## 2013-04-28 DIAGNOSIS — I255 Ischemic cardiomyopathy: Secondary | ICD-10-CM

## 2013-04-28 DIAGNOSIS — J441 Chronic obstructive pulmonary disease with (acute) exacerbation: Secondary | ICD-10-CM

## 2013-04-28 LAB — POCT INR: INR: 1.5

## 2013-04-28 MED ORDER — HYDROCORTISONE 2.5 % RE CREA: TOPICAL_CREAM | Freq: Two times a day (BID) | RECTAL | Status: DC

## 2013-04-28 MED ORDER — FENOFIBRATE 48 MG PO TABS: 48.0000 mg | ORAL_TABLET | Freq: Every day | ORAL | Status: DC

## 2013-04-28 NOTE — Assessment & Plan Note (Signed)
Turned down for RFA- currently rate controlled

## 2013-04-28 NOTE — Progress Notes (Signed)
04/28/2013 [name]   [birth year]  [id number]  Primary Physicia Thayer Headings, MD Primary Cardiologist: Dr [name]  HPI:  The patient is a 72 year old male who is followed by Dr. [name] with multiple medical problems. Please see my office note March 16, 2013, for complete details. He has had bypass grafting in the past and was cath'd last in Feb 2014. Plan is for medical therapy. He has an EF that ranges from 25% to as high as 55%. He has chronic AF and COPD. He was turned down for RFA.  In March he was hypotensive and we had backed off on his medications. He did well for a week or so and then ended up in the hospital in congestive heart failure. He was diuresed and we saw him in followup on March 16, 2013, and he was doing better. It appears that a good weight for him is approximately 191 pounds. We gave is family instructions to give him an extra 1/2 of Lasix if his weight went up 3 pounds. If it was still up 3 pounds the next day they were to give an extra whole Lasix. Since we saw him last his wife has had to do this once. She says his weight came right back down and he has been doing well. He is still weak but he is able to get up and around some now.         The main complaint is anorexia. She says he doesn't eat much and has no energy or stamina. His wgt has been stable.He has not had vomiting or diarrhea. He denies abdominal pain. He recently had labs at his primary care office that showed Triglycerides > 800. He was taken off statins in Jan secondary to elevated LFTs.    Current Outpatient Prescriptions  Medication Sig Dispense Refill  . acetaminophen (TYLENOL) 650 MG CR tablet Take 650 mg by mouth every 8 (eight) hours as needed. For pain      . calcium carbonate (OS-CAL) 600 MG TABS Take 600 mg by mouth daily.      . clopidogrel (PLAVIX) 75 MG tablet Take 75 mg by mouth daily.      . digoxin (LANOXIN) 0.125 MG tablet Take 1 tablet (0.125 mg total) by mouth daily.  30 tablet  5  .  diltiazem (CARDIZEM CD) 120 MG 24 hr capsule Take 1 capsule (120 mg total) by mouth daily.  30 capsule  5  . diltiazem (CARDIZEM CD) 240 MG 24 hr capsule Take 1 capsule (240 mg total) by mouth at bedtime.  30 capsule  5  . fluticasone (FLONASE) 50 MCG/ACT nasal spray Place 2 sprays into both nostrils daily.       . furosemide (LASIX) 40 MG tablet Take 1 tablet (40 mg total) by mouth daily.  30 tablet  5  . isosorbide mononitrate (IMDUR) 30 MG 24 hr tablet Take 1 tablet (30 mg total) by mouth daily at 3 pm.  30 tablet  5  . leflunomide (ARAVA) 20 MG tablet       . levothyroxine (SYNTHROID, LEVOTHROID) 150 MCG tablet Take 150 mcg by mouth daily.      . metoprolol tartrate (LOPRESSOR) 25 MG tablet Take 1-1.5 tablets (25-37.5 mg total) by mouth daily.  105 tablet  5  . nitroGLYCERIN (NITROSTAT) 0.4 MG SL tablet Place 0.4 mg under the tongue every 5 (five) minutes as needed.      . Omega-3 Fatty Acids (FISH OIL) 1000 MG CAPS Take 3,000 mg  by mouth every morning.      [name] omeprazole (PRILOSEC) 20 MG capsule Take 20 mg by mouth daily.      . predniSONE (DELTASONE) 5 MG tablet Take 5 mg by mouth daily.      . Saxagliptin-Metformin (KOMBIGLYZE XR) 2.04-999 MG TB24 Take 1 tablet by mouth 2 (two) times daily.      [name] triamcinolone cream (KENALOG) 0.1 %       . valACYclovir (VALTREX) 1000 MG tablet       . warfarin (COUMADIN) 5 MG tablet Take 5 mg by mouth daily. As directed      . aspirin 81 MG tablet Take 81 mg by mouth daily.      . fenofibrate (TRICOR) 48 MG tablet Take 1 tablet (48 mg total) by mouth daily.  30 tablet  11  . Rivaroxaban (XARELTO) 20 MG TABS Take 1 tablet (20 mg total) by mouth daily.  30 tablet  5   No current facility-administered medications for this visit.    Allergies  Allergen Reactions  . Clarithromycin Other (See Comments)    Unknown, thinks "blisters in mouth"  . Influenza Vaccines Other (See Comments)    Severe coughing for weeks after vaccine; also had flu like symptoms  for about 7 to 8 weeks.   . Lisinopril Other (See Comments)    cough  . Ranexa (Ranolazine Er)   . Statins     History   Social History  . Marital Status: Married    Spouse Name: N/A    Number of Children: N/A  . Years of Education: N/A   Occupational History  . Not on file.   Social History Main Topics  . Smoking status: Never Smoker   . Smokeless tobacco: Never Used  . Alcohol Use: No  . Drug Use: No  . Sexually Active: Not on file   Other Topics Concern  . Not on file   Social History Narrative  . No narrative on file     Review of Systems: General: negative for chills, fever, night sweats or weight changes.  Cardiovascular: negative for chest pain, dyspnea on exertion, edema, orthopnea, palpitations, paroxysmal nocturnal dyspnea or shortness of breath Dermatological: negative for rash Respiratory: negative for cough or wheezing Urologic: negative for hematuria Abdominal: negative for nausea, vomiting, diarrhea, bright red blood per rectum, melena, or hematemesis Neurologic: negative for visual changes, syncope, or dizziness All other systems reviewed and are otherwise negative except as noted above.    Blood pressure 108/72, pulse 64, height 6' (1.829 m), weight 182 lb 3.2 oz (82.645 kg).  General appearance: alert, cooperative and no distress Lungs: clear to auscultation bilaterally Heart: irregularly irregular rhythm Abdomen: soft, non-tender; bowel sounds normal; no masses,  no organomegaly  .  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 Now with triglycerides >800   PLAN  Check amylaze and lipase. No change in cardiac Rx. Keep follow up with Dr  [name] in June.  Lorynn Moeser KPA-C 04/28/2013 2:58 PM

## 2013-04-28 NOTE — Assessment & Plan Note (Signed)
No angina 

## 2013-04-28 NOTE — Progress Notes (Signed)
Chief Complaint  Patient presents with  . Other    Eval int and ext hems    HISTORY: [name] is a 72 y.o. male who presents to the office with rectal pain.  Other symptoms include urgency, bleeding and loose stools.  This had been occurring for months.  He has tried prep H in the past with some success.   It is intermittent in nature.  His bowel habits are irregular and his bowel movements are mostly loose.  His fiber intake is moderate.  His last colonoscopy was 4-5 yrs ago, he thinks.  He thinks he has prolapsing tissue.     Past Medical History  Diagnosis Date  . Pleuritis     h/o------ with negative vats biopsy  . Acute myocardial infarction, unspecified site, episode of care unspecified   . Hypertension   . Other and unspecified hyperlipidemia   . Coronary artery disease   . Chronic ischemic heart disease, unspecified   . Diabetes mellitus   . 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 Chronic anticoagulation, on coumadin 01/12/2013  . Cardiomyopathy, ischemic EF 25% 01/12/2013    now improved to 40-45% 01/2013  . Anginal pain   . Hypothyroidism   . Shortness of breath 01/12/2013    2D Echo - EF 45-50, moderate concentric hypertrophy of the left ventricle  . GERD (gastroesophageal reflux disease)   . Arthritis     all over  . LVF (left ventricular failure) 07/01/2012    2D Echo - EF 35-45, moderate concentric left ventricular hyperthrophy, LA severely dilated  . Atrial fibrillation 03/04/2011    Persistant; Persantine Cardiolite - inferolateral defect likely representing prior LCX infarct, study not gated      Past Surgical History  Procedure Laterality Date  . Bypass graft    . Lung surgery    . Knee surgery    . Carpal tunnel release    . Cataract extraction    . Coronary artery bypass graft    . Cardiac catheterization Left 01/11/2013    Severe CAD with notable progression of disease in the SVG-RPDA followed by severe stenosis  in grafted vessel, region not safely "reachede" for PCI, moderate ischemic CM no notable chage in EF, continue medical therapy  . Cardiac catheterization  04/03/2011    Left circumflex 95% stenosis, essentially unchanged from last cath  . Cardiac catheterization  04/01/2010    LAD 99% proximal stenosis with vague flow, circumflex 80% stenosis, essentially unchanged form last cath  . Cardiac catheterization  03/28/2010    Mild pulmonary hypertension, medical therapy  . Cardiac catheterization  04/30/2009    LAD 95% stenosis, essentially unchanged fomr last cath, medical therapy  . Cardiac catheterization  12/05/2008    2.5x12 apex, predilation in the distal PLA branch, 3x23 XIENCE V durg eluting stent and post dilated with a 3.25x20 [facility] VOYAGER at 15 atmospheres        Current Outpatient Prescriptions  Medication Sig Dispense Refill  . acetaminophen (TYLENOL) 650 MG CR tablet Take 650 mg by mouth every 8 (eight) hours as needed. For pain      . aspirin 81 MG tablet Take 81 mg by mouth daily.      . calcium carbonate (OS-CAL) 600 MG TABS Take 600 mg by mouth daily.      . clopidogrel (PLAVIX) 75 MG tablet Take 75 mg by mouth daily.      . digoxin (LANOXIN) 0.125 MG tablet Take  1 tablet (0.125 mg total) by mouth daily.  30 tablet  5  . diltiazem (CARDIZEM CD) 120 MG 24 hr capsule Take 1 capsule (120 mg total) by mouth daily.  30 capsule  5  . diltiazem (CARDIZEM CD) 240 MG 24 hr capsule Take 1 capsule (240 mg total) by mouth at bedtime.  30 capsule  5  . fenofibrate (TRICOR) 48 MG tablet Take 1 tablet (48 mg total) by mouth daily.  30 tablet  11  . fluticasone (FLONASE) 50 MCG/ACT nasal spray Place 2 sprays into both nostrils daily.       . furosemide (LASIX) 40 MG tablet Take 1 tablet (40 mg total) by mouth daily.  30 tablet  5  . isosorbide mononitrate (IMDUR) 30 MG 24 hr tablet Take 1 tablet (30 mg total) by mouth daily at 3 pm.  30 tablet  5  . leflunomide (ARAVA) 20 MG tablet       .  levothyroxine (SYNTHROID, LEVOTHROID) 150 MCG tablet Take 150 mcg by mouth daily.      . metoprolol tartrate (LOPRESSOR) 25 MG tablet Take 1-1.5 tablets (25-37.5 mg total) by mouth daily.  105 tablet  5  . nitroGLYCERIN (NITROSTAT) 0.4 MG SL tablet Place 0.4 mg under the tongue every 5 (five) minutes as needed.      . Omega-3 Fatty Acids (FISH OIL) 1000 MG CAPS Take 3,000 mg by mouth every morning.      [name] omeprazole (PRILOSEC) 20 MG capsule Take 20 mg by mouth daily.      . predniSONE (DELTASONE) 5 MG tablet Take 5 mg by mouth daily.      . Saxagliptin-Metformin (KOMBIGLYZE XR) 2.04-999 MG TB24 Take 1 tablet by mouth 2 (two) times daily.      [name] triamcinolone cream (KENALOG) 0.1 %       . valACYclovir (VALTREX) 1000 MG tablet       . warfarin (COUMADIN) 5 MG tablet Take 5 mg by mouth daily. As directed      . hydrocortisone (PROCTOZONE-HC) 2.5 % rectal cream Place rectally 2 (two) times daily.  30 g  0  . Rivaroxaban (XARELTO) 20 MG TABS Take 1 tablet (20 mg total) by mouth daily.  30 tablet  5   No current facility-administered medications for this visit.      Allergies  Allergen Reactions  . Clarithromycin Other (See Comments)    Unknown, thinks "blisters in mouth"  . Influenza Vaccines Other (See Comments)    Severe coughing for weeks after vaccine; also had flu like symptoms for about 7 to 8 weeks.   . Lisinopril Other (See Comments)    cough  . Ranexa (Ranolazine Er)   . Statins       Family History  Problem Relation Age of Onset  . Heart disease Mother   . Heart disease Father   . Heart disease Brother   . Heart disease Maternal Aunt   . Cancer      History   Social History  . Marital Status: Married    Spouse Name: N/A    Number of Children: N/A  . Years of Education: N/A   Social History Main Topics  . Smoking status: Never Smoker   . Smokeless tobacco: Never Used  . Alcohol Use: No  . Drug Use: No  . Sexually Active: None   Other Topics Concern  . None    Social History Narrative  . None      REVIEW OF  SYSTEMS - PERTINENT POSITIVES ONLY: Review of Systems - General ROS: negative for - chills, fever or weight loss Hematological and Lymphatic ROS: negative for - bleeding problems, blood clots or bruising Respiratory ROS: no cough, shortness of breath, or wheezing Cardiovascular ROS: no chest pain or dyspnea on exertion Gastrointestinal ROS: positive for - blood in stools and diarrhea negative for - abdominal pain or appetite loss Genito-Urinary ROS: no dysuria, trouble voiding, or hematuria  EXAM: Filed Vitals:   04/28/13 1557  BP: 132/78  Pulse: 64  Resp: 22    General appearance: alert and cooperative Resp: clear to auscultation bilaterally Cardio: regular rate and rhythm GI: soft, non-tender; bowel sounds normal; no masses,  no organomegaly   Procedure: Anoscopy Surgeon: Maisie Fus Diagnosis: anal pain  Assistant: Christella Scheuermann After the risks and benefits were explained, verbal consent was obtained for above procedure  Anesthesia: none Findings: R posterior hemorrhoid, mild inflammation    ASSESSMENT AND PLAN: FERDINANDO LODGE is a 72 y.o. M with fecal urgency and anal pain.  On exam he has one large mildly inflamed R post hemorrhoid.  This is starting to feel better on the increased fiber.  I have recommended that he cont to increase his fiber to 25g a day.  I have given him a hydrocortisone cream for his ana irritation.  I will see him back in 6 weeks.    Vanita Panda, MD Colon and Rectal Surgery / General Surgery [facility]      Visit Diagnoses: 1. Internal and external hemorrhoids without complication     Primary Care Physician: Thayer Headings, MD

## 2013-04-28 NOTE — Patient Instructions (Signed)
Keep apt with Dr [name] in June

## 2013-04-28 NOTE — Assessment & Plan Note (Signed)
Back on Coumadin

## 2013-04-28 NOTE — Assessment & Plan Note (Signed)
Taken off statin in Jan- presumably secondary to elevated LFTs. Now with triglycerides >800

## 2013-04-28 NOTE — Assessment & Plan Note (Signed)
Rheumatoid lung, chronic dyspnea

## 2013-04-28 NOTE — Assessment & Plan Note (Signed)
No CHF on exam 

## 2013-04-28 NOTE — Assessment & Plan Note (Signed)
In setting of rapid AF and acute CHF

## 2013-04-28 NOTE — Patient Instructions (Signed)
Fiber Chart  You should 25-30g of fiber per day and drinking 8 glasses of water to help your bowels move regularly.  In the chart below you can look up how much fiber you are getting in an average day.  If you are not getting enough fiber, you should add a fiber supplement to your diet.  Examples of this include Metamucil, FiberCon and Citrucel.  These can be purchased at your local grocery store or pharmacy.      http://www.[employer].edu/offices/health/nutritioncoach/Ato[employer]/handouts/Fiber.pdf   HEMORRHOIDS    Did you know... Hemorrhoids are one of the most common ailments known.  More than half the population will develop hemorrhoids, usually after age 30.  Millions of Americans currently suffer from hemorrhoids.  The average person suffers in silence for a long period before seeking medical care.  Today's treatment methods make some types of hemorrhoid removal much less painful.  What are hemorrhoids? Often described as "varicose veins of the anus and rectum", hemorrhoids are enlarged, bulging blood vessels in and about the anus and lower rectum. There are two types of hemorrhoids: external and internal, which refer to their location.  External (outside) hemorrhoids develop near the anus and are covered by very sensitive skin. These are usually painless. However, if a blood clot (thrombosis) develops in an external hemorrhoid, it becomes a painful, hard lump. The external hemorrhoid may bleed if it ruptures. Internal (inside) hemorrhoids develop within the anus beneath the lining. Painless bleeding and protrusion during bowel movements are the most common symptom. However, an internal hemorrhoid can cause severe pain if it is completely "prolapsed" - protrudes from the anal opening and cannot be pushed back inside.   What causes hemorrhoids? An exact cause is unknown; however, the upright posture of humans alone forces a great deal of pressure on the rectal veins, which sometimes causes them  to bulge. Other contributing factors include:  . Aging  . Chronic constipation or diarrhea  . Pregnancy  . Heredity  . Straining during bowel movements  . Faulty bowel function due to overuse of laxatives or enemas . Spending long periods of time (e.g., reading) on the toilet  Whatever the cause, the tissues supporting the vessels stretch. As a result, the vessels dilate; their walls become thin and bleed. If the stretching and pressure continue, the weakened vessels protrude.  What are the symptoms? If you notice any of the following, you could have hemorrhoids:  . Bleeding during bowel movements  . Protrusion during bowel movements . Itching in the anal area  . Pain  . Sensitive lump(s)  How are hemorrhoids treated? Mild symptoms can be relieved frequently by increasing the amount of fiber (e.g., fruits, vegetables, breads and cereals) and fluids in the diet. Eliminating excessive straining reduces the pressure on hemorrhoids and helps prevent them from protruding. A sitz bath - sitting in plain warm water for about 10 minutes - can also provide some relief . With these measures, the pain and swelling of most symptomatic hemorrhoids will decrease in two to seven days, and the firm lump should recede within four to six weeks. In cases of severe or persistent pain from a thrombosed hemorrhoid, your physician may elect to remove the hemorrhoid containing the clot with a small incision. Performed under local anesthesia as an outpatient, this procedure generally provides relief. Severe hemorrhoids may require special treatment, much of which can be performed on an outpatient basis.  . Ligation - the rubber band treatment - works effectively on internal hemorrhoids that   protrude with bowel movements. A small rubber band is placed over the hemorrhoid, cutting off its blood supply. The hemorrhoid and the band fall off in a few days and the wound usually heals in a week or two. This procedure  sometimes produces mild discomfort and bleeding and may need to be repeated for a full effect.  There is a more intense version of this procedure that is done in the OR as outpatient surgery called THD.  It involves identifying blood vessels leading to the hemorrhoids and then tying them off with sutures.  This method is a little more painful than rubber band ligation but less painful than traditional hemorrhoidectomy and usually does not have to be repeated.  It is best for internal hemorrhoids that bleed.  Rubber Band Ligation of Internal Hemorrhoids:  A.  Bulging, bleeding, internal hemorrhoid B.  Rubber band applied at the base of the hemorrhoid C.  About 7 days later, the banded hemorrhoid has fallen off leaving a small scar (arrow)  . Injection and Coagulation can also be used on bleeding hemorrhoids that do not protrude. Both methods are relatively painless and cause the hemorrhoid to shrivel up. . Hemorrhoidectomy - surgery to remove the hemorrhoids - is the most complete method for removal of internal and external hemorrhoids. It is necessary when (1) clots repeatedly form in external hemorrhoids; (2) ligation fails to treat internal hemorrhoids; (3) the protruding hemorrhoid cannot be reduced; or (4) there is persistent bleeding. A hemorrhoidectomy removes excessive tissue that causes the bleeding and protrusion. It is done under anesthesia using sutures, and may, depending upon circumstances, require hospitalization and a period of inactivity. Laser hemorrhoidectomies do not offer any advantage over standard operative techniques. They are also quite expensive, and contrary to popular belief, are no less painful.  Do hemorrhoids lead to cancer? No. There is no relationship between hemorrhoids and cancer. However, the symptoms of hemorrhoids, particularly bleeding, are similar to those of colorectal cancer and other diseases of the digestive system. Therefore, it is important that all symptoms  are investigated by a physician specially trained in treating diseases of the colon and rectum and that everyone 50 years or older undergo screening tests for colorectal cancer. Do not rely on over-the-counter medications or other self-treatments. See a colorectal surgeon first so your symptoms can be properly evaluated and effective treatment prescribed.  2012 American Society of Colon & Rectal Surgeons     

## 2013-04-28 NOTE — Assessment & Plan Note (Signed)
Last EF 40-45%

## 2013-04-29 LAB — LIPASE: Lipase: 53 U/L (ref 0–75)

## 2013-04-29 LAB — AMYLASE: Amylase: 15 U/L (ref 0–105)

## 2013-05-12 ENCOUNTER — Ambulatory Visit (INDEPENDENT_AMBULATORY_CARE_PROVIDER_SITE_OTHER): Payer: Medicare Other | Admitting: Pharmacist Clinician (PhC)/ Clinical Pharmacy Specialist

## 2013-05-12 VITALS — BP 102/68 | HR 64

## 2013-05-12 DIAGNOSIS — I4891 Unspecified atrial fibrillation: Secondary | ICD-10-CM

## 2013-05-12 DIAGNOSIS — I4821 Permanent atrial fibrillation: Secondary | ICD-10-CM

## 2013-05-12 DIAGNOSIS — Z7901 Long term (current) use of anticoagulants: Secondary | ICD-10-CM

## 2013-05-12 LAB — POCT INR: INR: 1.7

## 2013-05-17 ENCOUNTER — Encounter (INDEPENDENT_AMBULATORY_CARE_PROVIDER_SITE_OTHER): Payer: Self-pay

## 2013-05-17 NOTE — Progress Notes (Signed)
I rec'd a fax from [facility] requesting refill on Proctosol- HC 2.5 % cream.  Dr Maisie Fus gave the authorization as long as he continues fiber.  I faxed the refill authorization back.  [phone]

## 2013-05-25 ENCOUNTER — Ambulatory Visit
Admission: RE | Admit: 2013-05-25 | Discharge: 2013-05-25 | Disposition: A | Discharge status: Home / Self Care | Payer: Medicare Other | Source: Ambulatory Visit | Attending: Cardiology | Admitting: Cardiology

## 2013-05-25 ENCOUNTER — Ambulatory Visit (INDEPENDENT_AMBULATORY_CARE_PROVIDER_SITE_OTHER): Payer: Medicare Other | Admitting: Pharmacist Clinician (PhC)/ Clinical Pharmacy Specialist

## 2013-05-25 ENCOUNTER — Ambulatory Visit (INDEPENDENT_AMBULATORY_CARE_PROVIDER_SITE_OTHER): Payer: Medicare Other | Admitting: Cardiology

## 2013-05-25 ENCOUNTER — Ambulatory Visit: Payer: Medicare Other | Admitting: Cardiovascular Disease

## 2013-05-25 ENCOUNTER — Encounter: Payer: Self-pay | Admitting: Cardiology

## 2013-05-25 VITALS — BP 100/60 | HR 58 | Ht 72.0 in | Wt 196.5 lb

## 2013-05-25 DIAGNOSIS — R531 Weakness: Secondary | ICD-10-CM | POA: Insufficient documentation

## 2013-05-25 DIAGNOSIS — R5381 Other malaise: Secondary | ICD-10-CM

## 2013-05-25 DIAGNOSIS — I10 Essential (primary) hypertension: Secondary | ICD-10-CM

## 2013-05-25 DIAGNOSIS — E119 Type 2 diabetes mellitus without complications: Secondary | ICD-10-CM

## 2013-05-25 DIAGNOSIS — R0602 Shortness of breath: Secondary | ICD-10-CM

## 2013-05-25 DIAGNOSIS — I5042 Chronic combined systolic (congestive) and diastolic (congestive) heart failure: Secondary | ICD-10-CM | POA: Insufficient documentation

## 2013-05-25 DIAGNOSIS — R05 Cough: Secondary | ICD-10-CM

## 2013-05-25 DIAGNOSIS — I2589 Other forms of chronic ischemic heart disease: Secondary | ICD-10-CM

## 2013-05-25 DIAGNOSIS — I2510 Atherosclerotic heart disease of native coronary artery without angina pectoris: Secondary | ICD-10-CM

## 2013-05-25 DIAGNOSIS — I255 Ischemic cardiomyopathy: Secondary | ICD-10-CM

## 2013-05-25 DIAGNOSIS — I5022 Chronic systolic (congestive) heart failure: Secondary | ICD-10-CM

## 2013-05-25 DIAGNOSIS — R630 Anorexia: Secondary | ICD-10-CM

## 2013-05-25 DIAGNOSIS — Z7901 Long term (current) use of anticoagulants: Secondary | ICD-10-CM

## 2013-05-25 DIAGNOSIS — I4821 Permanent atrial fibrillation: Secondary | ICD-10-CM

## 2013-05-25 DIAGNOSIS — J441 Chronic obstructive pulmonary disease with (acute) exacerbation: Secondary | ICD-10-CM

## 2013-05-25 DIAGNOSIS — I4891 Unspecified atrial fibrillation: Secondary | ICD-10-CM

## 2013-05-25 DIAGNOSIS — E785 Hyperlipidemia, unspecified: Secondary | ICD-10-CM

## 2013-05-25 LAB — POCT INR: INR: 2.1

## 2013-05-25 MED ORDER — GUAIFENESIN-CODEINE 100-10 MG/5ML PO SYRP: 5.0000 mL | ORAL_SOLUTION | Freq: Every evening | ORAL | Status: DC | PRN

## 2013-05-25 MED ORDER — GUAIFENESIN ER 600 MG PO TB12: 1200.0000 mg | ORAL_TABLET | Freq: Two times a day (BID) | ORAL | Status: DC

## 2013-05-25 NOTE — Assessment & Plan Note (Signed)
extremely weak over the weekend, Dig level found to be 1.8.  Have D/c'd the digoxin.

## 2013-05-25 NOTE — Assessment & Plan Note (Signed)
HR in his chronic atrial fib is 58.  Dig level 1.8 I have d/c'd dig.

## 2013-05-25 NOTE — Assessment & Plan Note (Signed)
Stable HF today.

## 2013-05-25 NOTE — Assessment & Plan Note (Signed)
Borderline BP today.  But stable, not lightheaded

## 2013-05-25 NOTE — Assessment & Plan Note (Signed)
Stable

## 2013-05-25 NOTE — Patient Instructions (Signed)
Stop Lanoxin/Digoxin    Have chest xray done today if possible  Use cough syrup at night  Follow up with Dr. [name] in 6 weeks   Call if no improvement in 5 days  Or if worsening symptoms

## 2013-05-25 NOTE — Progress Notes (Signed)
05/25/2013   PCP: Thayer Headings, MD   Chief Complaint  Patient presents with  . FOLLOW UP    SAW [facility]KE 5/16; SOB ALL THE TIME; poor appetite, weak    Primary Cardiologist:Dr. Erlene Quan   HPI: 72 year old WM male who is followed by Dr. [name] with multiple medical problems.   He has had bypass grafting in 1998. His last cath was in Feb 2014. Plan is for medical therapy. He has an EF that ranges from 25% to as high as 55%. He has chronic AF and COPD.  In March he was hypotensive and we had backed off on his medications. He did well for a week or so and then ended up in the hospital in congestive heart failure. He was diuresed and we saw him in followup on March 16, 2013, and he was doing better. It appears that a good weight for him is approximately 191 pounds. We gave is family instructions to give him an extra 1/2 of Lasix if his weight went up 3 pounds. If it was still up 3 pounds the next day they were to give an extra whole Lasix.   He has several complaints today 1-this week and he was very weak seemed somewhat confused at times but over the last 2 days this has improved and today he is back to his baseline.  He does complain of chronic shortness of breath.   He continues to complain of anorexia. His wife says he doesn't eat much and has no energy or stamina. His wgt has been stable.  On his last visit he was 182 pounds and today he is 196, got home his weight is 183.   He has not had vomiting or diarrhea. He denies abdominal pain. He recently had labs at his primary care office that showed Triglycerides > 800. He was taken off statins in Jan secondary to elevated LFTs.   Lipid panel was again drawn today.  INR was stable today 2.2. The pharmacist with primary care check the dig level and it was elevated at 1.8 with his chronic anorexia now I am stopping his Lanoxin.  Today his heart rate is controlled at 59 chronic A. fib no acute changes otherwise.  He has no chest  pain.     Allergies  Allergen Reactions  . Clarithromycin Other (See Comments)    Unknown, thinks "blisters in mouth"  . Influenza Vaccines Other (See Comments)    Severe coughing for weeks after vaccine; also had flu like symptoms for about 7 to 8 weeks.   . Lisinopril Other (See Comments)    cough  . Ranexa (Ranolazine Er)   . Statins     Current Outpatient Prescriptions  Medication Sig Dispense Refill  . acetaminophen (TYLENOL) 650 MG CR tablet Take 650 mg by mouth every 8 (eight) hours as needed. For pain      . aspirin 81 MG tablet Take 81 mg by mouth daily.      . calcium carbonate (OS-CAL) 600 MG TABS Take 600 mg by mouth daily.      . clopidogrel (PLAVIX) 75 MG tablet Take 75 mg by mouth daily.      [name] diltiazem (CARDIZEM CD) 120 MG 24 hr capsule Take 120 mg by mouth every morning.      . diltiazem (CARDIZEM CD) 240 MG 24 hr capsule Take 1 capsule (240 mg total) by mouth at bedtime.  30 capsule  5  . fenofibrate (TRICOR) 48 MG  tablet Take 1 tablet (48 mg total) by mouth daily.  30 tablet  11  . fluticasone (FLONASE) 50 MCG/ACT nasal spray Place 2 sprays into both nostrils daily.       . furosemide (LASIX) 40 MG tablet Take 1 tablet (40 mg total) by mouth daily.  30 tablet  5  . hydrocortisone (PROCTOZONE-HC) 2.5 % rectal cream Place rectally 2 (two) times daily.  30 g  0  . isosorbide mononitrate (IMDUR) 30 MG 24 hr tablet Take 1 tablet (30 mg total) by mouth daily at 3 pm.  30 tablet  5  . levothyroxine (SYNTHROID, LEVOTHROID) 150 MCG tablet Take 150 mcg by mouth daily.      . metoprolol tartrate (LOPRESSOR) 25 MG tablet Take 25-37.5 mg by mouth daily. 1 & 1/2 tab in AM; 1 tablet at nite      . nitroGLYCERIN (NITROSTAT) 0.4 MG SL tablet Place 0.4 mg under the tongue every 5 (five) minutes as needed.      . Omega-3 Fatty Acids (FISH OIL) 1000 MG CAPS Take 3,000 mg by mouth every morning.      [name] omeprazole (PRILOSEC) 20 MG capsule Take 20 mg by mouth daily.      . predniSONE  (DELTASONE) 5 MG tablet Take 5 mg by mouth daily.      . saxagliptin HCl (ONGLYZA) 5 MG TABS tablet Take 5 mg by mouth daily.      [name] triamcinolone cream (KENALOG) 0.1 % Apply 1 application topically daily.       [name] warfarin (COUMADIN) 5 MG tablet Take 5 mg by mouth daily. As directed      . guaiFENesin (MUCINEX) 600 MG 12 hr tablet Take 2 tablets (1,200 mg total) by mouth 2 (two) times daily.  60 tablet  1  . guaiFENesin-codeine (ROBITUSSIN AC) 100-10 MG/5ML syrup Take 5 mLs by mouth at bedtime as needed for cough.  120 mL  0   No current facility-administered medications for this visit.    Past Medical History  Diagnosis Date  . Pleuritis     h/o------ with negative vats biopsy  . Acute myocardial infarction, unspecified site, episode of care unspecified   . Hypertension   . Other and unspecified hyperlipidemia   . Coronary artery disease   . Chronic ischemic heart disease, unspecified   . Diabetes mellitus   . 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 Chronic anticoagulation, on coumadin 01/12/2013  . Cardiomyopathy, ischemic EF 25% 01/12/2013    now improved to 40-45% 01/2013  . Anginal pain   . Hypothyroidism   . Shortness of breath 01/12/2013    2D Echo - EF 45-50, moderate concentric hypertrophy of the left ventricle  . GERD (gastroesophageal reflux disease)   . Arthritis     all over  . LVF (left ventricular failure) 07/01/2012    2D Echo - EF 35-45, moderate concentric left ventricular hyperthrophy, LA severely dilated  . Atrial fibrillation 03/04/2011    Persistant; Persantine Cardiolite - inferolateral defect likely representing prior LCX infarct, study not gated    Past Surgical History  Procedure Laterality Date  . Bypass graft    . Lung surgery    . Knee surgery    . Carpal tunnel release    . Cataract extraction    . Coronary artery bypass graft    . Cardiac catheterization Left 01/11/2013    Severe CAD with notable progression  of disease in the SVG-RPDA followed  by severe stenosis in grafted vessel, region not safely "reachede" for PCI, moderate ischemic CM no notable chage in EF, continue medical therapy  . Cardiac catheterization  04/03/2011    Left circumflex 95% stenosis, essentially unchanged from last cath  . Cardiac catheterization  04/01/2010    LAD 99% proximal stenosis with vague flow, circumflex 80% stenosis, essentially unchanged form last cath  . Cardiac catheterization  03/28/2010    Mild pulmonary hypertension, medical therapy  . Cardiac catheterization  04/30/2009    LAD 95% stenosis, essentially unchanged fomr last cath, medical therapy  . Cardiac catheterization  12/05/2008    2.5x12 apex, predilation in the distal PLA branch, 3x23 XIENCE V durg eluting stent and post dilated with a 3.25x20 [place] VOYAGER at 15 atmospheres    ZOX:WRUEAVW:UJ colds or fevers,  weight changes up and down within 24 hours time not sure how accurate that is Skin:no rashes or ulcers HEENT:no blurred vision, no congestion CV:see HPI PUL:see HPI positive for cough he was up most of the night coughing thick yellow mucus. He's sleeping and has been sleeping for the last several months in his recliner  GI:no diarrhea + constipation no melena, no indigestion GU:no hematuria, no dysuria MS:no joint pain, no claudication Neuro:no syncope, no lightheadedness Endo:+ diabetes- followed by PCP, + thyroid disease followed by PCP  PHYSICAL EXAM BP 100/60  Pulse 58  Ht 6' (1.829 m)  Wt 196 lb 8 oz (89.132 kg)  BMI 26.64 kg/m2 General:Pleasant affect, NAD., pale, walked to room with cane Skin:Warm and dry, brisk capillary refill HEENT:normocephalic, sclera clear, mucus membranes moist Neck:supple, no JVD sitting up in the chair, no bruits  Heart:S1S2 RRR without murmur, gallup, rub or click Lungs:clear without rales, + rhonchi and wheezes WJX:BJYN, non tender, + BS, do not palpate liver spleen or masses Ext:no lower ext edema,  2+  radial pulses Neuro:alert and oriented, MAE, follows commands, + facial symmetry  WGN:FAOZHY fib, rate control at 58 nonspecific intraventricular conduction delay but no acute changes from previous tracing  ASSESSMENT AND PLAN Weakness extremely weak over the weekend, Dig level found to be 1.8.  Have D/c'd the digoxin.   Anorexia Weight stable, Dig level elevated I have stopped Lanoxin, perhaps this has been part of his anorexia since he last discharge.  CAF,  RVR at 170 on admit - 3/26 HR in his chronic atrial fib is 58.  Dig level 1.8 I have d/c'd dig.  Chronic systolic HF (heart failure) Stable HF today.  CAD, CABG '98, cath 01/14/13- medical Rx No chest pain  HYPERTENSION Borderline BP today.  But stable, not lightheaded  Diabetes mellitus Followed by PCP.  Recent medication changes  DYSLIPIDEMIA Labs done today, no results yet.   Last TRIG >800.  LFTS were improved but still elevated.  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  For PA and Lat CXR.  Productive cough yellow mucus.  No fever.  Prescribed guaifenesin 1200 mg twice a day to help thin the mucus as well as tusinex cough syrup at night to suppress the cough.  Cardiomyopathy, ischemic EF 25% at one time most recent cath EF 40-45% 01/2013 Stable   Checking chest x-ray for possible pneumonia otherwise to use guaifenesin over-the-counter there I did send a prescription for 1200 mg twice a day to use for one week and then as needed to thin the mucus test next call syrup at night. He'll follow with Dr. [name] in 4 weeks unless  he has problems prior to that time.  We did discuss using stool softeners for his constipation to increase his fiber he does have hemorrhoids and may need local surgery for his hemorrhoids currently is using a cream he signed out with a surgeon for that issue. Also discussed using a probiotic daily such as yogurt or versus a tablet.

## 2013-05-25 NOTE — Assessment & Plan Note (Signed)
No chest pain

## 2013-05-25 NOTE — Assessment & Plan Note (Signed)
Weight stable, Dig level elevated I have stopped Lanoxin, perhaps this has been part of his anorexia since he last discharge.

## 2013-05-25 NOTE — Assessment & Plan Note (Signed)
Labs done today, no results yet.   Last TRIG >800.  LFTS were improved but still elevated.

## 2013-05-25 NOTE — Assessment & Plan Note (Signed)
Followed by PCP.  Recent medication changes

## 2013-05-25 NOTE — Assessment & Plan Note (Signed)
Wheezes and rhonchi today.  For PA and Lat CXR.  Productive cough yellow mucus.  No fever.  Prescribed guaifenesin 1200 mg twice a day to help thin the mucus as well as tusinex cough syrup at night to suppress the cough.

## 2013-06-12 ENCOUNTER — Ambulatory Visit (INDEPENDENT_AMBULATORY_CARE_PROVIDER_SITE_OTHER): Payer: Medicare Other | Admitting: General Surgery

## 2013-06-12 ENCOUNTER — Encounter (INDEPENDENT_AMBULATORY_CARE_PROVIDER_SITE_OTHER): Payer: Self-pay | Admitting: General Surgery

## 2013-06-12 VITALS — BP 112/72 | HR 88 | Temp 98.6°F | Resp 22 | Ht 72.0 in | Wt 201.4 lb

## 2013-06-12 DIAGNOSIS — K649 Unspecified hemorrhoids: Secondary | ICD-10-CM

## 2013-06-12 NOTE — Progress Notes (Signed)
OVID WITMAN is a 72 y.o. male who is here for a follow up visit regarding anal pain and urgency.  He is still having urgency but his anal pain is better on fiber, stool softeners and steroid cream.  He denies straining or bleeding.  Objective: Filed Vitals:   06/12/13 1532  BP: 112/72  Pulse: 88  Temp: 98.6 F (37 C)  Resp: 22    General appearance: alert and cooperative GI: normal findings: soft, non-tender Perianal: noninflamed external hemorrhoids, posterior sacral rash  Assessment and Plan: YOSHIHARU BRASSELL is doing better.  I have asked him to start to taper his hemorrhoid cream.  He should continue the high fiber diet and stool softeners.  I will see him back in 3 months.    Vanita Panda, MD [facility], [place] [phone]

## 2013-06-12 NOTE — Patient Instructions (Signed)
Start to taper the hemorrhoid cream to where you are just using it when you have symptoms.  Continue your high fiber diet and stool softeners.

## 2013-06-22 ENCOUNTER — Ambulatory Visit (INDEPENDENT_AMBULATORY_CARE_PROVIDER_SITE_OTHER): Payer: Self-pay | Admitting: Pharmacist Clinician (PhC)/ Clinical Pharmacy Specialist

## 2013-06-22 VITALS — BP 92/60 | HR 64

## 2013-06-22 DIAGNOSIS — I4891 Unspecified atrial fibrillation: Secondary | ICD-10-CM

## 2013-06-22 DIAGNOSIS — Z7901 Long term (current) use of anticoagulants: Secondary | ICD-10-CM

## 2013-06-22 DIAGNOSIS — I4821 Permanent atrial fibrillation: Secondary | ICD-10-CM

## 2013-06-22 LAB — POCT INR: INR: 1.7

## 2013-06-29 ENCOUNTER — Encounter: Payer: Self-pay | Admitting: Cardiovascular Disease

## 2013-06-29 ENCOUNTER — Ambulatory Visit (INDEPENDENT_AMBULATORY_CARE_PROVIDER_SITE_OTHER): Payer: Medicare Other | Admitting: Pharmacist Clinician (PhC)/ Clinical Pharmacy Specialist

## 2013-06-29 ENCOUNTER — Ambulatory Visit (INDEPENDENT_AMBULATORY_CARE_PROVIDER_SITE_OTHER): Payer: Medicare Other | Admitting: Cardiovascular Disease

## 2013-06-29 VITALS — BP 102/72 | HR 68 | Ht 72.0 in | Wt 198.0 lb

## 2013-06-29 DIAGNOSIS — I2510 Atherosclerotic heart disease of native coronary artery without angina pectoris: Secondary | ICD-10-CM

## 2013-06-29 DIAGNOSIS — R531 Weakness: Secondary | ICD-10-CM

## 2013-06-29 DIAGNOSIS — I4891 Unspecified atrial fibrillation: Secondary | ICD-10-CM

## 2013-06-29 DIAGNOSIS — Z7901 Long term (current) use of anticoagulants: Secondary | ICD-10-CM

## 2013-06-29 DIAGNOSIS — R5381 Other malaise: Secondary | ICD-10-CM

## 2013-06-29 DIAGNOSIS — I4821 Permanent atrial fibrillation: Secondary | ICD-10-CM

## 2013-06-29 DIAGNOSIS — I5023 Acute on chronic systolic (congestive) heart failure: Secondary | ICD-10-CM

## 2013-06-29 LAB — POCT INR: INR: 1.6

## 2013-06-29 NOTE — Assessment & Plan Note (Signed)
Since stopping his digoxin and changing his oral hypoglycemic his weak the weakness has resolved

## 2013-06-29 NOTE — Patient Instructions (Addendum)
Your physician wants you to follow-up in: 3 months with an extender and 6 months with Dr Berry. You will receive a reminder letter in the mail two months in advance. If you don't receive a letter, please call our office to schedule the follow-up appointment.  

## 2013-06-29 NOTE — Assessment & Plan Note (Signed)
Rate controlled on Coumadin anticoagulation 

## 2013-06-29 NOTE — Assessment & Plan Note (Signed)
His most recent ejection fraction was 45-50% by 2-D echo performed 01/12/2013. He appears to be compensated today.

## 2013-06-29 NOTE — Progress Notes (Signed)
06/29/2013 [name]   29-May-1941  [id number]  Primary Physician Thayer Headings, MD Primary Cardiologist: Runell Gess MD [name]  HPI:  He has had bypass grafting in 1998. His last cath was in Feb 2014. Plan is for medical therapy. He has an EF that ranges from 25% to as high as 55%. He has chronic AF and COPD. In March he was hypotensive and we had backed off on his medications. He did well for a week or so and then ended up in the hospital in congestive heart failure. He was diuresed and we saw him in followup on March 16, 2013, and he was doing better. It appears that a good weight for him is approximately 191 pounds. We gave is family instructions to give him an extra 1/2 of Lasix if his weight went up 3 pounds. If it was still up 3 pounds the next day they were to give an extra whole Lasix.  He has several complaints today 1-this week and he was very weak seemed somewhat confused at times but over the last 2 days this has improved and today he is back to his baseline. He does complain of chronic shortness of breath. He continues to complain of anorexia. His wife says he doesn't eat much and has no energy or stamina. His wgt has been stable. On his last visit he was 182 pounds and today he is 196, got home his weight is 183. He has not had vomiting or diarrhea. He denies abdominal pain. He recently had labs at his primary care office that showed Triglycerides > 800. He was taken off statins in Jan secondary to elevated LFTs. Lipid panel was again drawn today.  INR was stable today 2.2. The pharmacist with primary care check the dig level and it was elevated at 1.8 with his chronic anorexia now I am stopping his Lanoxin. Since stopping his digoxin one month ago by Nada Boozer registered nurse practitioner and changing his oral hypoglycemic medication he subsequently improved    Current Outpatient Prescriptions  Medication Sig Dispense Refill  . acetaminophen (TYLENOL)  650 MG CR tablet Take 650 mg by mouth every 8 (eight) hours as needed. For pain      . aspirin 81 MG tablet Take 81 mg by mouth daily.      . calcium carbonate (OS-CAL) 600 MG TABS Take 600 mg by mouth daily.      . clopidogrel (PLAVIX) 75 MG tablet Take 75 mg by mouth daily.      [name] diltiazem (CARDIZEM CD) 120 MG 24 hr capsule Take 120 mg by mouth every morning.      . diltiazem (CARDIZEM CD) 240 MG 24 hr capsule Take 1 capsule (240 mg total) by mouth at bedtime.  30 capsule  5  . fenofibrate (TRICOR) 48 MG tablet Take 1 tablet (48 mg total) by mouth daily.  30 tablet  11  . fluticasone (FLONASE) 50 MCG/ACT nasal spray Place 2 sprays into both nostrils daily.       . furosemide (LASIX) 40 MG tablet Take 1 tablet (40 mg total) by mouth daily.  30 tablet  5  . hydrocortisone (PROCTOZONE-HC) 2.5 % rectal cream Place rectally 2 (two) times daily.  30 g  0  . isosorbide mononitrate (IMDUR) 30 MG 24 hr tablet Take 1 tablet (30 mg total) by mouth daily at 3 pm.  30 tablet  5  . levothyroxine (SYNTHROID, LEVOTHROID) 150 MCG tablet Take 150 mcg by mouth  daily.      . metoprolol tartrate (LOPRESSOR) 25 MG tablet Take 25-37.5 mg by mouth daily. 1 & 1/2 tab in AM; 1 tablet at nite      . nitroGLYCERIN (NITROSTAT) 0.4 MG SL tablet Place 0.4 mg under the tongue every 5 (five) minutes as needed.      . Omega-3 Fatty Acids (FISH OIL) 1000 MG CAPS Take 3,000 mg by mouth every morning.      [name] omeprazole (PRILOSEC) 20 MG capsule Take 20 mg by mouth daily.      . predniSONE (DELTASONE) 5 MG tablet Take 5 mg by mouth daily.      . saxagliptin HCl (ONGLYZA) 5 MG TABS tablet Take 5 mg by mouth daily.      [name] triamcinolone cream (KENALOG) 0.1 % Apply 1 application topically daily.       [name] warfarin (COUMADIN) 5 MG tablet Take 5 mg by mouth daily. As directed       No current facility-administered medications for this visit.    Allergies  Allergen Reactions  . Clarithromycin Other (See Comments)    Unknown, thinks  "blisters in mouth"  . Influenza Vaccines Other (See Comments)    Severe coughing for weeks after vaccine; also had flu like symptoms for about 7 to 8 weeks.   . Lisinopril Other (See Comments)    cough  . Ranexa (Ranolazine Er)   . Statins     History   Social History  . Marital Status: Married    Spouse Name: N/A    Number of Children: N/A  . Years of Education: N/A   Occupational History  . Not on file.   Social History Main Topics  . Smoking status: Never Smoker   . Smokeless tobacco: Never Used  . Alcohol Use: No  . Drug Use: No  . Sexually Active: Not on file   Other Topics Concern  . Not on file   Social History Narrative  . No narrative on file     Review of Systems: General: negative for chills, fever, night sweats or weight changes.  Cardiovascular: negative for chest pain, dyspnea on exertion, edema, orthopnea, palpitations, paroxysmal nocturnal dyspnea or shortness of breath Dermatological: negative for rash Respiratory: negative for cough or wheezing Urologic: negative for hematuria Abdominal: negative for nausea, vomiting, diarrhea, bright red blood per rectum, melena, or hematemesis Neurologic: negative for visual changes, syncope, or dizziness All other systems reviewed and are otherwise negative except as noted above.    Blood pressure 102/72, pulse 68, height 6' (1.829 m), weight 198 lb (89.812 kg).  General appearance: alert and no distress Neck: no adenopathy, no carotid bruit, no JVD, supple, symmetrical, trachea midline and thyroid not enlarged, symmetric, no tenderness/mass/nodules Lungs: clear to auscultation bilaterally Heart: irregularly irregular rhythm Extremities: extremities normal, atraumatic, no cyanosis or edema  EKG not performed today  ASSESSMENT AND PLAN:   No problem-specific assessment & plan notes found for this encounter.      Runell Gess MD FACP,FACC,FAHA, [facility] 06/29/2013 1:53 PM

## 2013-07-05 ENCOUNTER — Other Ambulatory Visit: Payer: Self-pay | Admitting: Cardiovascular Disease

## 2013-07-06 ENCOUNTER — Ambulatory Visit (INDEPENDENT_AMBULATORY_CARE_PROVIDER_SITE_OTHER): Payer: Medicare Other | Admitting: Pharmacist Clinician (PhC)/ Clinical Pharmacy Specialist

## 2013-07-06 VITALS — BP 110/80 | HR 60

## 2013-07-06 DIAGNOSIS — Z7901 Long term (current) use of anticoagulants: Secondary | ICD-10-CM

## 2013-07-06 DIAGNOSIS — I4821 Permanent atrial fibrillation: Secondary | ICD-10-CM

## 2013-07-06 DIAGNOSIS — I4891 Unspecified atrial fibrillation: Secondary | ICD-10-CM

## 2013-07-06 LAB — POCT INR: INR: 1.7

## 2013-07-24 ENCOUNTER — Other Ambulatory Visit: Payer: Self-pay | Admitting: Cardiovascular Disease

## 2013-07-24 NOTE — Telephone Encounter (Signed)
Rx was sent to pharmacy electronically. 

## 2013-07-27 ENCOUNTER — Ambulatory Visit (INDEPENDENT_AMBULATORY_CARE_PROVIDER_SITE_OTHER): Payer: Medicare Other | Admitting: Pharmacist Clinician (PhC)/ Clinical Pharmacy Specialist

## 2013-07-27 VITALS — BP 110/60 | HR 56

## 2013-07-27 DIAGNOSIS — I4821 Permanent atrial fibrillation: Secondary | ICD-10-CM

## 2013-07-27 DIAGNOSIS — I4891 Unspecified atrial fibrillation: Secondary | ICD-10-CM

## 2013-07-27 DIAGNOSIS — Z7901 Long term (current) use of anticoagulants: Secondary | ICD-10-CM

## 2013-08-24 ENCOUNTER — Ambulatory Visit (INDEPENDENT_AMBULATORY_CARE_PROVIDER_SITE_OTHER): Payer: Medicare Other | Admitting: Pharmacist Clinician (PhC)/ Clinical Pharmacy Specialist

## 2013-08-24 VITALS — BP 110/74 | HR 64

## 2013-08-24 DIAGNOSIS — Z7901 Long term (current) use of anticoagulants: Secondary | ICD-10-CM

## 2013-08-24 DIAGNOSIS — I4821 Permanent atrial fibrillation: Secondary | ICD-10-CM

## 2013-08-24 DIAGNOSIS — I4891 Unspecified atrial fibrillation: Secondary | ICD-10-CM

## 2013-09-01 ENCOUNTER — Other Ambulatory Visit (HOSPITAL_COMMUNITY): Payer: Self-pay | Admitting: Physician Assistant

## 2013-09-01 NOTE — Telephone Encounter (Signed)
Rx was sent to pharmacy electronically. 

## 2013-09-12 ENCOUNTER — Ambulatory Visit (INDEPENDENT_AMBULATORY_CARE_PROVIDER_SITE_OTHER): Payer: Medicare Other | Admitting: General Surgery

## 2013-09-18 ENCOUNTER — Ambulatory Visit (INDEPENDENT_AMBULATORY_CARE_PROVIDER_SITE_OTHER): Payer: Medicare Other | Admitting: Pharmacist Clinician (PhC)/ Clinical Pharmacy Specialist

## 2013-09-18 ENCOUNTER — Ambulatory Visit (INDEPENDENT_AMBULATORY_CARE_PROVIDER_SITE_OTHER): Payer: Medicare Other | Admitting: Cardiology

## 2013-09-18 VITALS — BP 100/78 | HR 61 | Ht 72.0 in | Wt 214.0 lb

## 2013-09-18 DIAGNOSIS — I2510 Atherosclerotic heart disease of native coronary artery without angina pectoris: Secondary | ICD-10-CM

## 2013-09-18 DIAGNOSIS — I4891 Unspecified atrial fibrillation: Secondary | ICD-10-CM

## 2013-09-18 DIAGNOSIS — I5022 Chronic systolic (congestive) heart failure: Secondary | ICD-10-CM

## 2013-09-18 DIAGNOSIS — Z7901 Long term (current) use of anticoagulants: Secondary | ICD-10-CM

## 2013-09-18 DIAGNOSIS — I4821 Permanent atrial fibrillation: Secondary | ICD-10-CM

## 2013-09-18 DIAGNOSIS — E785 Hyperlipidemia, unspecified: Secondary | ICD-10-CM

## 2013-09-18 DIAGNOSIS — R630 Anorexia: Secondary | ICD-10-CM

## 2013-09-18 MED ORDER — FUROSEMIDE 40 MG PO TABS: 40.0000 mg | ORAL_TABLET | Freq: Every day | ORAL | Status: DC

## 2013-09-18 MED ORDER — DILTIAZEM HCL ER COATED BEADS 120 MG PO CP24: 120.0000 mg | ORAL_CAPSULE | Freq: Every day | ORAL | Status: DC

## 2013-09-18 MED ORDER — DILTIAZEM HCL ER COATED BEADS 240 MG PO CP24: 240.0000 mg | ORAL_CAPSULE | Freq: Every day | ORAL | Status: DC

## 2013-09-18 NOTE — Patient Instructions (Addendum)
Continue Medications as they are. I called in new prescription for Lasix and cardizem for 90 days.  Continue the extra lasix as needed.  Follow up with Dr. [name] in 3 months.  Call if chest pain, increased shortness of breath.

## 2013-09-19 ENCOUNTER — Encounter: Payer: Self-pay | Admitting: Cardiology

## 2013-09-19 NOTE — Assessment & Plan Note (Signed)
Resolved stopping Lanoxin.  He looks much better and is feeling better.  Increase of energy-has been mowing his yard on riding Therapist, sports.

## 2013-09-19 NOTE — Assessment & Plan Note (Signed)
Followed by PCP, T. Chol 124, HDL 27, LDL 65, TG 160 on 12/19/08

## 2013-09-19 NOTE — Assessment & Plan Note (Signed)
Rate controlled atrial fib now.

## 2013-09-19 NOTE — Progress Notes (Signed)
10/76/2014  PCP: Thayer Headings, MD   Chief Complaint  Patient presents with  . Dizziness    lightheaded,sob    Primary Cardiologist: Dr. [name]  HPI:  72 year old WMM with hx of bypass grafting in 1998. His last cath was in Feb 2014. Plan is for medical therapy. He has an EF that ranges from 25% to as high as 55%. He has chronic/Permanent  AF and COPD. In March he was hypotensive and we had backed off on his medications. He did well for a week or so and then ended up in the hospital in congestive heart failure. He was diuresed and we saw him in followup on March 16, 2013, and he was doing better. It appears that a good weight for him is approximately 191 pounds.  He developed anorexia and Dig. Was stopped and today pt is doing well.  So well he is riding his lawnmower this summer to cut the grass!    He has rare episode of chest pain, no NTG recently.  He does take an extra Lasix if wt climbs with improvement in wt.  This plan is working well for him.   His wt today is elevated, but his home weight is 203 which is his new dry weight.  Here with all of his clothes, shoes, keys he is 214.  He was 203 on his home scales.    He does relate last week he had brief episode of dizziness when he got up from chair, he had extra lasix that week as well.  These episodes are fairly rare.  He is to see Dr. Dareen Piano for back and leg pain.  Dr. Thea Silversmith follows his cholesterol. Last check 08/17/13: T. Chol 124, HDL 27, LDL 65, TG 160.  Very good though HDL could be improved.   Allergies  Allergen Reactions  . Clarithromycin Other (See Comments)    Unknown, thinks "blisters in mouth"  . Influenza Vaccines Other (See Comments)    Severe coughing for weeks after vaccine; also had flu like symptoms for about 7 to 8 weeks.   . Lanoxin [Digoxin] Other (See Comments)    Nausea, anorexia  . Lisinopril Other (See Comments)    cough  . Ranexa [Ranolazine Er]   . Statins     Current  Outpatient Prescriptions  Medication Sig Dispense Refill  . acetaminophen (TYLENOL) 650 MG CR tablet Take 650 mg by mouth every 8 (eight) hours as needed. For pain      . aspirin 81 MG tablet Take 81 mg by mouth daily.      . calcium carbonate (OS-CAL) 600 MG TABS Take 600 mg by mouth daily.      . clopidogrel (PLAVIX) 75 MG tablet Take 75 mg by mouth daily.      . fenofibrate (TRICOR) 48 MG tablet Take 1 tablet (48 mg total) by mouth daily.  30 tablet  11  . fluticasone (FLONASE) 50 MCG/ACT nasal spray Place 2 sprays into both nostrils daily.       . hydrocortisone (PROCTOZONE-HC) 2.5 % rectal cream Place rectally 2 (two) times daily.  30 g  0  . isosorbide mononitrate (IMDUR) 30 MG 24 hr tablet Take 1 tablet (30 mg total) by mouth daily at 3 pm.  30 tablet  5  . levothyroxine (SYNTHROID, LEVOTHROID) 150 MCG tablet Take 150 mcg by mouth daily.      . metoprolol tartrate (LOPRESSOR) 25 MG tablet TAKE 1  TABLET BY MOUTH TWICE A DAY  180 tablet  3  . nitroGLYCERIN (NITROSTAT) 0.4 MG SL tablet Place 0.4 mg under the tongue every 5 (five) minutes as needed.      . Omega-3 Fatty Acids (FISH OIL) 1000 MG CAPS Take 3,000 mg by mouth every morning.      [name] omeprazole (PRILOSEC) 20 MG capsule Take 20 mg by mouth daily.      . predniSONE (DELTASONE) 5 MG tablet Take 5 mg by mouth daily.      . rosuvastatin (CRESTOR) 10 MG tablet Take 10 mg by mouth daily.      . saxagliptin HCl (ONGLYZA) 5 MG TABS tablet Take 5 mg by mouth daily.      [name] triamcinolone cream (KENALOG) 0.1 % Apply 1 application topically daily.       [name] warfarin (COUMADIN) 5 MG tablet Take 5 mg by mouth daily. As directed      . diltiazem (CARDIZEM CD) 120 MG 24 hr capsule Take 1 capsule (120 mg total) by mouth daily.  90 capsule  3  . diltiazem (CARDIZEM CD) 240 MG 24 hr capsule Take 1 capsule (240 mg total) by mouth daily.  90 capsule  3  . furosemide (LASIX) 40 MG tablet Take 1 tablet (40 mg total) by mouth daily. May also take an extra  Lasix 40 mg if weight increases by 3-5 pounds  110 tablet  3   No current facility-administered medications for this visit.    Past Medical History  Diagnosis Date  . Pleuritis     h/o------ with negative vats biopsy  . Acute myocardial infarction, unspecified site, episode of care unspecified   . Hypertension   . Other and unspecified hyperlipidemia   . Coronary artery disease   . Chronic ischemic heart disease, unspecified   . Diabetes mellitus   . 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 Chronic anticoagulation, on coumadin 01/12/2013  . Cardiomyopathy, ischemic EF 25% 01/12/2013    now improved to 40-45% 01/2013  . Anginal pain   . Hypothyroidism   . Shortness of breath 01/12/2013    2D Echo - EF 45-50, moderate concentric hypertrophy of the left ventricle  . GERD (gastroesophageal reflux disease)   . Arthritis     all over  . LVF (left ventricular failure) 07/01/2012    2D Echo - EF 35-45, moderate concentric left ventricular hyperthrophy, LA severely dilated  . Atrial fibrillation 03/04/2011    Persistant; Persantine Cardiolite - inferolateral defect likely representing prior LCX infarct, study not gated  . Digitalis toxicity     Past Surgical History  Procedure Laterality Date  . Bypass graft    . Lung surgery    . Knee surgery    . Carpal tunnel release    . Cataract extraction    . Coronary artery bypass graft    . Cardiac catheterization Left 01/11/2013    Severe CAD with notable progression of disease in the SVG-RPDA followed by severe stenosis in grafted vessel, region not safely "reachede" for PCI, moderate ischemic CM no notable chage in EF, continue medical therapy  . Cardiac catheterization  04/03/2011    Left circumflex 95% stenosis, essentially unchanged from last cath  . Cardiac catheterization  04/01/2010    LAD 99% proximal stenosis with vague flow, circumflex 80% stenosis, essentially unchanged form last cath  .  Cardiac catheterization  03/28/2010    Mild pulmonary hypertension, medical therapy  . Cardiac  catheterization  04/30/2009    LAD 95% stenosis, essentially unchanged fomr last cath, medical therapy  . Cardiac catheterization  12/05/2008    2.5x12 apex, predilation in the distal PLA branch, 3x23 XIENCE V durg eluting stent and post dilated with a 3.25x20 [place] VOYAGER at 15 atmospheres    ZOX:WRUEAVW:UJ colds or fevers,  weight increase but his home weight is staying stable Skin:no rashes or ulcers HEENT:no blurred vision, no congestion CV:see HPI PUL:see HPI GI:no diarrhea constipation or melena, no indigestion GU:no hematuria, no dysuria MS:no joint pain, no claudication Neuro:no syncope, + lightheadedness one or 2 brief episodes. Endo:+ diabetes, + thyroid disease- followed by PCP and stable  PHYSICAL EXAM BP 100/78  Pulse 61  Ht 6' (1.829 m)  Wt 214 lb (97.07 kg)  BMI 29.02 kg/m2 General:Pleasant affect, NAD Skin:Warm and dry, brisk capillary refill HEENT:normocephalic, sclera clear, mucus membranes moist Neck:supple, no JVD, no bruits  Heart:irreg irreg with murmur, gallup, rub or click Lungs:clear without rales, rhonchi, or wheezes WJX:BJYN, non tender, + BS, do not palpate liver spleen or masses Ext:tr lower ext edema, 2+ post tib pulses, 2+ radial pulses Neuro:alert and oriented, MAE, follows commands, + facial symmetry  WGN:FAOZ, HR 61 rate controlled Atrial fib.  With intraventricular conduction delay.   ASSESSMENT AND PLAN Chronic systolic HF (heart failure) Stable, weight is up today but at home his weight has been stable, if weight increases by 3-5 pounds he takes an extra Lasix.  (weight up here due to clothes, keys, shoes)  Continue current meds.  Labs through Dr. Linnell Fulling.   CAF,  RVR at 170 on admit - 3/26 Rate controlled atrial fib now.    CAD, CABG '98, cath 01/14/13- medical Rx Rare chest pain, has not had to take NTG but once.   Anorexia Resolved  stopping Lanoxin.  He looks much better and is feeling better.  Increase of energy-has been mowing his yard on riding Therapist, sports.  DYSLIPIDEMIA Followed by PCP, T. Chol 124, HDL 27, LDL 65, TG 160 on 3/0/86  Follow up with Dr. [name] in 3 months.

## 2013-09-19 NOTE — Assessment & Plan Note (Signed)
Rare chest pain, has not had to take NTG but once.

## 2013-09-19 NOTE — Assessment & Plan Note (Signed)
Stable, weight is up today but at home his weight has been stable, if weight increases by 3-5 pounds he takes an extra Lasix.  (weight up here due to clothes, keys, shoes)  Continue current meds.  Labs through Dr. Linnell Fulling.

## 2013-09-20 ENCOUNTER — Encounter: Payer: Self-pay | Admitting: Cardiology

## 2013-09-20 ENCOUNTER — Encounter: Payer: Self-pay | Admitting: Cardiovascular Disease

## 2013-10-16 ENCOUNTER — Ambulatory Visit: Payer: Medicare Other | Admitting: Pharmacist Clinician (PhC)/ Clinical Pharmacy Specialist

## 2013-10-17 ENCOUNTER — Ambulatory Visit (INDEPENDENT_AMBULATORY_CARE_PROVIDER_SITE_OTHER): Payer: Medicare Other | Admitting: Pharmacist Clinician (PhC)/ Clinical Pharmacy Specialist

## 2013-10-17 VITALS — BP 104/70 | HR 72

## 2013-10-17 DIAGNOSIS — I4821 Permanent atrial fibrillation: Secondary | ICD-10-CM

## 2013-10-17 DIAGNOSIS — I4891 Unspecified atrial fibrillation: Secondary | ICD-10-CM

## 2013-10-17 DIAGNOSIS — Z7901 Long term (current) use of anticoagulants: Secondary | ICD-10-CM

## 2013-10-25 ENCOUNTER — Other Ambulatory Visit: Payer: Self-pay

## 2013-10-25 MED ORDER — FENOFIBRATE 48 MG PO TABS: 48.0000 mg | ORAL_TABLET | Freq: Every day | ORAL | Status: DC

## 2013-10-25 NOTE — Telephone Encounter (Signed)
Rx was sent to pharmacy electronically. 

## 2013-11-14 ENCOUNTER — Ambulatory Visit (INDEPENDENT_AMBULATORY_CARE_PROVIDER_SITE_OTHER): Payer: Medicare Other | Admitting: Pharmacist Clinician (PhC)/ Clinical Pharmacy Specialist

## 2013-11-14 VITALS — BP 112/72 | HR 68

## 2013-11-14 DIAGNOSIS — I4821 Permanent atrial fibrillation: Secondary | ICD-10-CM

## 2013-11-14 DIAGNOSIS — I4891 Unspecified atrial fibrillation: Secondary | ICD-10-CM

## 2013-11-14 DIAGNOSIS — Z7901 Long term (current) use of anticoagulants: Secondary | ICD-10-CM

## 2013-11-15 ENCOUNTER — Other Ambulatory Visit: Payer: Self-pay | Admitting: Cardiovascular Disease

## 2013-11-21 ENCOUNTER — Other Ambulatory Visit: Payer: Self-pay | Admitting: Rheumatology

## 2013-11-21 DIAGNOSIS — M25552 Pain in left hip: Secondary | ICD-10-CM

## 2013-11-28 ENCOUNTER — Ambulatory Visit: Payer: Medicare Other | Admitting: Pharmacist Clinician (PhC)/ Clinical Pharmacy Specialist

## 2013-11-30 ENCOUNTER — Ambulatory Visit (INDEPENDENT_AMBULATORY_CARE_PROVIDER_SITE_OTHER): Payer: Medicare Other | Admitting: Pharmacist Clinician (PhC)/ Clinical Pharmacy Specialist

## 2013-11-30 ENCOUNTER — Ambulatory Visit
Admission: RE | Admit: 2013-11-30 | Discharge: 2013-11-30 | Disposition: A | Discharge status: Home / Self Care | Payer: Medicare Other | Source: Ambulatory Visit | Attending: Rheumatology | Admitting: Rheumatology

## 2013-11-30 VITALS — BP 118/70 | HR 64

## 2013-11-30 DIAGNOSIS — M25552 Pain in left hip: Secondary | ICD-10-CM

## 2013-11-30 DIAGNOSIS — I4821 Permanent atrial fibrillation: Secondary | ICD-10-CM

## 2013-11-30 DIAGNOSIS — I4891 Unspecified atrial fibrillation: Secondary | ICD-10-CM

## 2013-11-30 DIAGNOSIS — Z7901 Long term (current) use of anticoagulants: Secondary | ICD-10-CM

## 2013-11-30 LAB — POCT INR: INR: 1.7

## 2013-11-30 MED ORDER — NITROGLYCERIN 0.4 MG SL SUBL: 0.4000 mg | SUBLINGUAL_TABLET | SUBLINGUAL | Status: DC | PRN

## 2013-12-21 ENCOUNTER — Ambulatory Visit (INDEPENDENT_AMBULATORY_CARE_PROVIDER_SITE_OTHER): Payer: Medicare Other | Admitting: Pharmacist Clinician (PhC)/ Clinical Pharmacy Specialist

## 2013-12-21 VITALS — BP 110/64 | HR 72

## 2013-12-21 DIAGNOSIS — I4891 Unspecified atrial fibrillation: Secondary | ICD-10-CM

## 2013-12-21 DIAGNOSIS — Z7901 Long term (current) use of anticoagulants: Secondary | ICD-10-CM

## 2013-12-21 DIAGNOSIS — I4821 Permanent atrial fibrillation: Secondary | ICD-10-CM

## 2013-12-21 LAB — POCT INR: INR: 2.1

## 2014-01-18 ENCOUNTER — Ambulatory Visit (INDEPENDENT_AMBULATORY_CARE_PROVIDER_SITE_OTHER): Payer: Medicare Other | Admitting: Pharmacist Clinician (PhC)/ Clinical Pharmacy Specialist

## 2014-01-18 VITALS — BP 110/64 | HR 60

## 2014-01-18 DIAGNOSIS — I4891 Unspecified atrial fibrillation: Secondary | ICD-10-CM

## 2014-01-18 DIAGNOSIS — I4821 Permanent atrial fibrillation: Secondary | ICD-10-CM

## 2014-01-18 DIAGNOSIS — Z7901 Long term (current) use of anticoagulants: Secondary | ICD-10-CM

## 2014-01-18 LAB — POCT INR: INR: 2.3

## 2014-02-23 ENCOUNTER — Ambulatory Visit (INDEPENDENT_AMBULATORY_CARE_PROVIDER_SITE_OTHER): Payer: Medicare Other | Admitting: Pharmacist Clinician (PhC)/ Clinical Pharmacy Specialist

## 2014-02-23 ENCOUNTER — Encounter: Payer: Self-pay | Admitting: Cardiovascular Disease

## 2014-02-23 ENCOUNTER — Ambulatory Visit (INDEPENDENT_AMBULATORY_CARE_PROVIDER_SITE_OTHER): Payer: Medicare Other | Admitting: Cardiovascular Disease

## 2014-02-23 VITALS — BP 102/72 | HR 60 | Ht 72.0 in | Wt 219.7 lb

## 2014-02-23 DIAGNOSIS — I4821 Permanent atrial fibrillation: Secondary | ICD-10-CM

## 2014-02-23 DIAGNOSIS — I219 Acute myocardial infarction, unspecified: Secondary | ICD-10-CM

## 2014-02-23 DIAGNOSIS — Z7901 Long term (current) use of anticoagulants: Secondary | ICD-10-CM

## 2014-02-23 DIAGNOSIS — I4891 Unspecified atrial fibrillation: Secondary | ICD-10-CM

## 2014-02-23 DIAGNOSIS — I2510 Atherosclerotic heart disease of native coronary artery without angina pectoris: Secondary | ICD-10-CM

## 2014-02-23 DIAGNOSIS — I10 Essential (primary) hypertension: Secondary | ICD-10-CM

## 2014-02-23 DIAGNOSIS — E785 Hyperlipidemia, unspecified: Secondary | ICD-10-CM

## 2014-02-23 LAB — POCT INR: INR: 2.5

## 2014-02-23 NOTE — Assessment & Plan Note (Signed)
On statin therapy followed by his PCP 

## 2014-02-23 NOTE — Assessment & Plan Note (Signed)
Coronary artery disease status post coronary artery bypass graft in 1998 with multiple interventions August of 08 in December of 09. I cathed him/20/12 revealing patent grafts with disease of his RCA vein graft and he was cathed once again by Dr. Bryan Lemma 01/11/13 with similar anatomy and ejection fraction of 45%. He denies chest pain. He does have chronic stable dyspnea. He monitors his weight daily and is on oral diuretic.

## 2014-02-23 NOTE — Progress Notes (Signed)
02/23/2014 [name]   [birth year]  [id number]  Primary Physician Thayer Headings, MD Primary Cardiologist: Runell Gess MD [name]   HPI:  He has had bypass grafting in 1998. His last cath was in Feb 2014. Plan is for medical therapy. He has an EF that ranges from 25% to as high as 55%. He has chronic AF and COPD. In March he was hypotensive and we had backed off on his medications. He did well for a week or so and then ended up in the hospital in congestive heart failure. He was diuresed and we saw him in followup on March 16, 2013, and he was doing better. It appears that a good weight for him is approximately 191 pounds. We gave is family instructions to give him an extra 1/2 of Lasix if his weight went up 3 pounds. If it was still up 3 pounds the next day they were to give an extra whole Lasix.  He has several complaints today 1-this week and he was very weak seemed somewhat confused at times but over the last 2 days this has improved and today he is back to his baseline. He does complain of chronic shortness of breath. He continues to complain of anorexia. His wife says he doesn't eat much and has no energy or stamina. His wgt has been stable. On his last visit he was 182 pounds and today he is 196, got home his weight is 183. He has not had vomiting or diarrhea. He denies abdominal pain. He recently had labs at his primary care office that showed Triglycerides > 800. He was taken off statins in Jan secondary to elevated LFTs. Lipid panel was again drawn today.   The pharmacist with primary care check the dig level and it was elevated at 1.8 with his chronic anorexia now I am stopping his Lanoxin.  He saw Alease Frame in the office 09/19/13 and was clinically stable. Actually, he felt better after stopping his digoxin and adjusting his diabetic medications. His dyspnea is stable as well. He denies chest pain. He checks his weight on a daily basis and adjust his  diuretics accordingly.     Current Outpatient Prescriptions  Medication Sig Dispense Refill  . acetaminophen (TYLENOL) 650 MG CR tablet Take 650 mg by mouth every 8 (eight) hours as needed. For pain      . aspirin 81 MG tablet Take 81 mg by mouth daily.      . calcium carbonate (OS-CAL) 600 MG TABS Take 600 mg by mouth daily.      . clopidogrel (PLAVIX) 75 MG tablet Take 75 mg by mouth daily.      [name] diltiazem (CARDIZEM CD) 120 MG 24 hr capsule Take 1 capsule (120 mg total) by mouth daily.  90 capsule  3  . diltiazem (CARDIZEM CD) 240 MG 24 hr capsule Take 1 capsule (240 mg total) by mouth daily.  90 capsule  3  . fluticasone (FLONASE) 50 MCG/ACT nasal spray Place 2 sprays into both nostrils daily.       . furosemide (LASIX) 40 MG tablet Take 1 tablet (40 mg total) by mouth daily. May also take an extra Lasix 40 mg if weight increases by 3-5 pounds  110 tablet  3  . hydrocortisone (PROCTOZONE-HC) 2.5 % rectal cream Place rectally 2 (two) times daily.  30 g  0  . isosorbide mononitrate (IMDUR) 30 MG 24 hr tablet Take 1 tablet (30 mg total) by mouth  daily at 3 pm.  30 tablet  5  . LANTUS SOLOSTAR 100 UNIT/ML Solostar Pen Inject 20 Units into the skin daily.      [name] levothyroxine (SYNTHROID, LEVOTHROID) 150 MCG tablet Take 150 mcg by mouth daily.      . metoprolol tartrate (LOPRESSOR) 25 MG tablet Take 1.5 tablets (37.5 mg total) by mouth every morning and take 1 tablet (25 mg total) by mouth every evening.      . nitroGLYCERIN (NITROSTAT) 0.4 MG SL tablet Place 1 tablet (0.4 mg total) under the tongue every 5 (five) minutes as needed for chest pain.  100 tablet  12  . nystatin cream (MYCOSTATIN) Apply as needed      . Omega-3 Fatty Acids (FISH OIL) 1000 MG CAPS Take 3,000 mg by mouth every morning.      [name] omeprazole (PRILOSEC) 20 MG capsule Take 20 mg by mouth daily.      . predniSONE (DELTASONE) 5 MG tablet Take 5 mg by mouth daily.      . rosuvastatin (CRESTOR) 10 MG tablet Take 10 mg by mouth  daily.      . saxagliptin HCl (ONGLYZA) 5 MG TABS tablet Take 5 mg by mouth daily.      [name] triamcinolone cream (KENALOG) 0.1 % Apply 1 application topically daily.       [name] warfarin (COUMADIN) 5 MG tablet TAKE 1 TO 2 TABLETS BY MOUTH EVERY DAY AS DIRECTED  135 tablet  2   No current facility-administered medications for this visit.    Allergies  Allergen Reactions  . Clarithromycin Other (See Comments)    Unknown, thinks "blisters in mouth"  . Influenza Vaccines Other (See Comments)    Severe coughing for weeks after vaccine; also had flu like symptoms for about 7 to 8 weeks.   . Lanoxin [Digoxin] Other (See Comments)    Nausea, anorexia  . Lisinopril Other (See Comments)    cough  . Ranexa [Ranolazine Er]   . Statins     History   Social History  . Marital Status: Married    Spouse Name: N/A    Number of Children: N/A  . Years of Education: N/A   Occupational History  . Not on file.   Social History Main Topics  . Smoking status: Never Smoker   . Smokeless tobacco: Never Used  . Alcohol Use: No  . Drug Use: No  . Sexual Activity: Not on file   Other Topics Concern  . Not on file   Social History Narrative  . No narrative on file     Review of Systems: General: negative for chills, fever, night sweats or weight changes.  Cardiovascular: negative for chest pain, dyspnea on exertion, edema, orthopnea, palpitations, paroxysmal nocturnal dyspnea or shortness of breath Dermatological: negative for rash Respiratory: negative for cough or wheezing Urologic: negative for hematuria Abdominal: negative for nausea, vomiting, diarrhea, bright red blood per rectum, melena, or hematemesis Neurologic: negative for visual changes, syncope, or dizziness All other systems reviewed and are otherwise negative except as noted above.    Blood pressure 102/72, pulse 60, height 6' (1.829 m), weight 219 lb 11.2 oz (99.655 kg).  General appearance: alert and no distress Neck: no  adenopathy, no carotid bruit, no JVD, supple, symmetrical, trachea midline and thyroid not enlarged, symmetric, no tenderness/mass/nodules Lungs: clear to auscultation bilaterally Heart: irregularly irregular rhythm Extremities: extremities normal, atraumatic, no cyanosis or edema  EKG atrial fibrillation with a ventricular response of 60 and  left bundle branch block new since the prior tracing.  ASSESSMENT AND PLAN:   CAD, CABG '98, cath 01/14/13- medical Rx Coronary artery disease status post coronary artery bypass graft in 1998 with multiple interventions August of 08 in December of 09. I cathed him/20/12 revealing patent grafts with disease of his RCA vein graft and he was cathed once again by Dr. Bryan Lemma 01/11/13 with similar anatomy and ejection fraction of 45%. He denies chest pain. He does have chronic stable dyspnea. He monitors his weight daily and is on oral diuretic.  CAF,  RVR at 170 on admit - 3/26 Rate controlled on Coumadin anticoagulation. His choice complex is widened today suggesting the developed of a left bundle branch block.  DYSLIPIDEMIA On statin therapy followed by his PCP  HYPERTENSION Well-controlled on current medications      Runell Gess MD [facility], [facility] 02/23/2014 10:35 AM

## 2014-02-23 NOTE — Patient Instructions (Signed)
Your physician wants you to follow-up in: 6 months with Luke Kilroy PA and 1 year with Dr Berry. You will receive a reminder letter in the mail two months in advance. If you don't receive a letter, please call our office to schedule the follow-up appointment.  

## 2014-02-23 NOTE — Assessment & Plan Note (Signed)
Well-controlled on current medications 

## 2014-02-23 NOTE — Assessment & Plan Note (Signed)
Rate controlled on Coumadin anticoagulation. His choice complex is widened today suggesting the developed of a left bundle branch block.

## 2014-03-13 ENCOUNTER — Other Ambulatory Visit: Payer: Self-pay | Admitting: *Deleted

## 2014-03-13 MED ORDER — CLOPIDOGREL BISULFATE 75 MG PO TABS: 75.0000 mg | ORAL_TABLET | Freq: Every day | ORAL | Status: DC

## 2014-03-13 NOTE — Telephone Encounter (Signed)
Rx refill sent to patients pharmacy  

## 2014-03-23 ENCOUNTER — Ambulatory Visit (INDEPENDENT_AMBULATORY_CARE_PROVIDER_SITE_OTHER): Payer: Medicare Other | Admitting: Pharmacist Clinician (PhC)/ Clinical Pharmacy Specialist

## 2014-03-23 VITALS — BP 110/70 | HR 52

## 2014-03-23 DIAGNOSIS — Z7901 Long term (current) use of anticoagulants: Secondary | ICD-10-CM

## 2014-03-23 DIAGNOSIS — I4891 Unspecified atrial fibrillation: Secondary | ICD-10-CM

## 2014-03-23 DIAGNOSIS — I4821 Permanent atrial fibrillation: Secondary | ICD-10-CM

## 2014-03-23 LAB — POCT INR: INR: 2.2

## 2014-04-06 ENCOUNTER — Ambulatory Visit (INDEPENDENT_AMBULATORY_CARE_PROVIDER_SITE_OTHER): Payer: Medicare Other | Admitting: Pharmacist Clinician (PhC)/ Clinical Pharmacy Specialist

## 2014-04-06 VITALS — BP 122/68 | HR 56

## 2014-04-06 DIAGNOSIS — I4891 Unspecified atrial fibrillation: Secondary | ICD-10-CM

## 2014-04-06 DIAGNOSIS — Z7901 Long term (current) use of anticoagulants: Secondary | ICD-10-CM

## 2014-04-06 DIAGNOSIS — I4821 Permanent atrial fibrillation: Secondary | ICD-10-CM

## 2014-04-06 LAB — POCT INR: INR: 2.1

## 2014-04-11 ENCOUNTER — Telehealth: Payer: Self-pay | Admitting: *Deleted

## 2014-04-11 NOTE — Telephone Encounter (Signed)
Life insurance papers from [facility] [employer] filled out and mailed to patient as requested.

## 2014-04-26 ENCOUNTER — Other Ambulatory Visit: Payer: Self-pay | Admitting: Rheumatology

## 2014-04-26 DIAGNOSIS — M549 Dorsalgia, unspecified: Secondary | ICD-10-CM

## 2014-04-29 ENCOUNTER — Ambulatory Visit
Admission: RE | Admit: 2014-04-29 | Discharge: 2014-04-29 | Disposition: A | Discharge status: Home / Self Care | Payer: Medicare Other | Source: Ambulatory Visit | Attending: Rheumatology | Admitting: Rheumatology

## 2014-04-29 DIAGNOSIS — M549 Dorsalgia, unspecified: Secondary | ICD-10-CM

## 2014-05-04 ENCOUNTER — Ambulatory Visit (INDEPENDENT_AMBULATORY_CARE_PROVIDER_SITE_OTHER): Payer: Medicare Other | Admitting: Pharmacist Clinician (PhC)/ Clinical Pharmacy Specialist

## 2014-05-04 DIAGNOSIS — Z7901 Long term (current) use of anticoagulants: Secondary | ICD-10-CM

## 2014-05-04 DIAGNOSIS — I4821 Permanent atrial fibrillation: Secondary | ICD-10-CM

## 2014-05-04 DIAGNOSIS — I4891 Unspecified atrial fibrillation: Secondary | ICD-10-CM

## 2014-05-04 LAB — POCT INR: INR: 2

## 2014-05-08 ENCOUNTER — Other Ambulatory Visit: Payer: Self-pay | Admitting: *Deleted

## 2014-05-08 MED ORDER — METOPROLOL TARTRATE 25 MG PO TABS: ORAL_TABLET | ORAL | Status: DC

## 2014-05-08 NOTE — Telephone Encounter (Signed)
Rx refill sent to patient pharmacy   

## 2014-06-01 ENCOUNTER — Ambulatory Visit (INDEPENDENT_AMBULATORY_CARE_PROVIDER_SITE_OTHER): Payer: Medicare Other | Admitting: Cardiology

## 2014-06-01 ENCOUNTER — Encounter: Payer: Self-pay | Admitting: Cardiology

## 2014-06-01 ENCOUNTER — Ambulatory Visit (INDEPENDENT_AMBULATORY_CARE_PROVIDER_SITE_OTHER): Payer: Medicare Other | Admitting: Pharmacist Clinician (PhC)/ Clinical Pharmacy Specialist

## 2014-06-01 VITALS — BP 110/70 | HR 64 | Ht 72.0 in | Wt 218.0 lb

## 2014-06-01 DIAGNOSIS — Z7901 Long term (current) use of anticoagulants: Secondary | ICD-10-CM

## 2014-06-01 DIAGNOSIS — I4891 Unspecified atrial fibrillation: Secondary | ICD-10-CM

## 2014-06-01 DIAGNOSIS — I4821 Permanent atrial fibrillation: Secondary | ICD-10-CM

## 2014-06-01 LAB — POCT INR: INR: 3

## 2014-06-01 NOTE — Progress Notes (Signed)
Pt was supposed to have 6 month follow up- last saw Dr [name] in March. He is doing well-no cardiac complaints. Will reschedule for Sept.  [name] [facility] PA-C 06/01/2014 9:45 AM

## 2014-06-01 NOTE — Assessment & Plan Note (Signed)
On Coumadin for CAF 

## 2014-07-02 ENCOUNTER — Other Ambulatory Visit: Payer: Self-pay | Admitting: Cardiology

## 2014-07-03 NOTE — Telephone Encounter (Signed)
Rx was sent to pharmacy electronically. 

## 2014-07-04 ENCOUNTER — Ambulatory Visit (INDEPENDENT_AMBULATORY_CARE_PROVIDER_SITE_OTHER): Payer: Medicare Other | Admitting: Pharmacist Clinician (PhC)/ Clinical Pharmacy Specialist

## 2014-07-04 ENCOUNTER — Ambulatory Visit: Payer: Medicare Other | Admitting: Pharmacist Clinician (PhC)/ Clinical Pharmacy Specialist

## 2014-07-04 DIAGNOSIS — I4821 Permanent atrial fibrillation: Secondary | ICD-10-CM

## 2014-07-04 DIAGNOSIS — Z7901 Long term (current) use of anticoagulants: Secondary | ICD-10-CM

## 2014-07-04 DIAGNOSIS — I4891 Unspecified atrial fibrillation: Secondary | ICD-10-CM

## 2014-07-04 LAB — POCT INR: INR: 3.3

## 2014-07-04 MED ORDER — WARFARIN SODIUM 5 MG PO TABS: ORAL_TABLET | ORAL | Status: DC

## 2014-07-19 ENCOUNTER — Other Ambulatory Visit: Payer: Self-pay | Admitting: Cardiovascular Disease

## 2014-07-19 MED ORDER — ISOSORBIDE MONONITRATE ER 30 MG PO TB24: 30.0000 mg | ORAL_TABLET | Freq: Every day | ORAL | Status: DC

## 2014-07-25 ENCOUNTER — Ambulatory Visit (INDEPENDENT_AMBULATORY_CARE_PROVIDER_SITE_OTHER): Payer: Medicare Other | Admitting: Pharmacist Clinician (PhC)/ Clinical Pharmacy Specialist

## 2014-07-25 DIAGNOSIS — I4821 Permanent atrial fibrillation: Secondary | ICD-10-CM

## 2014-07-25 DIAGNOSIS — I4891 Unspecified atrial fibrillation: Secondary | ICD-10-CM

## 2014-07-25 DIAGNOSIS — Z7901 Long term (current) use of anticoagulants: Secondary | ICD-10-CM

## 2014-07-25 LAB — POCT INR: INR: 2.6

## 2014-08-15 ENCOUNTER — Encounter: Payer: Self-pay | Admitting: Cardiology

## 2014-08-15 ENCOUNTER — Ambulatory Visit: Payer: Medicare Other | Admitting: Cardiology

## 2014-08-15 ENCOUNTER — Ambulatory Visit: Payer: Medicare Other | Admitting: Pharmacist Clinician (PhC)/ Clinical Pharmacy Specialist

## 2014-08-15 ENCOUNTER — Ambulatory Visit (INDEPENDENT_AMBULATORY_CARE_PROVIDER_SITE_OTHER): Payer: Medicare Other | Admitting: Cardiology

## 2014-08-15 ENCOUNTER — Ambulatory Visit (INDEPENDENT_AMBULATORY_CARE_PROVIDER_SITE_OTHER): Payer: Medicare Other | Admitting: Pharmacist Clinician (PhC)/ Clinical Pharmacy Specialist

## 2014-08-15 VITALS — BP 122/66 | HR 76 | Ht 72.0 in | Wt 219.3 lb

## 2014-08-15 DIAGNOSIS — I10 Essential (primary) hypertension: Secondary | ICD-10-CM

## 2014-08-15 DIAGNOSIS — Z7901 Long term (current) use of anticoagulants: Secondary | ICD-10-CM

## 2014-08-15 DIAGNOSIS — I4821 Permanent atrial fibrillation: Secondary | ICD-10-CM

## 2014-08-15 DIAGNOSIS — I2589 Other forms of chronic ischemic heart disease: Secondary | ICD-10-CM

## 2014-08-15 DIAGNOSIS — I2510 Atherosclerotic heart disease of native coronary artery without angina pectoris: Secondary | ICD-10-CM

## 2014-08-15 DIAGNOSIS — I4891 Unspecified atrial fibrillation: Secondary | ICD-10-CM

## 2014-08-15 DIAGNOSIS — M48061 Spinal stenosis, lumbar region without neurogenic claudication: Secondary | ICD-10-CM

## 2014-08-15 DIAGNOSIS — J441 Chronic obstructive pulmonary disease with (acute) exacerbation: Secondary | ICD-10-CM

## 2014-08-15 DIAGNOSIS — I447 Left bundle-branch block, unspecified: Secondary | ICD-10-CM | POA: Insufficient documentation

## 2014-08-15 DIAGNOSIS — E785 Hyperlipidemia, unspecified: Secondary | ICD-10-CM

## 2014-08-15 DIAGNOSIS — M4800 Spinal stenosis, site unspecified: Secondary | ICD-10-CM | POA: Insufficient documentation

## 2014-08-15 DIAGNOSIS — I255 Ischemic cardiomyopathy: Secondary | ICD-10-CM

## 2014-08-15 LAB — POCT INR: INR: 2.2

## 2014-08-15 NOTE — Assessment & Plan Note (Signed)
No change, chronic DOE, he has been on steroids for years for this

## 2014-08-15 NOTE — Assessment & Plan Note (Signed)
Type 2 NIDDM 

## 2014-08-15 NOTE — Patient Instructions (Signed)
STOP taking your Plavix  Your physician wants you to follow-up in: 6 months with Dr.Berry.  You will receive a reminder letter in the mail two months in advance. If you don't receive a letter, please call our office to schedule the follow-up appointment.

## 2014-08-15 NOTE — Assessment & Plan Note (Signed)
Controlled.  

## 2014-08-15 NOTE — Assessment & Plan Note (Signed)
Rate stable.

## 2014-08-15 NOTE — Progress Notes (Signed)
08/15/2014 [name]   23-Apr-1941  [id number]  Primary Physicia Daniel Headings, MD Primary Cardiologist: Dr [name]  HPI:  73 y/o with a history of bypass grafting in 1998. His last cath was in Feb 2014. Plan is for medical therapy. He has an EF that ranges from 25% to as high as 55%. His last EF was 40-45%.  He has chronic AF, LBBB and COPD on chronic steroids. In March he was hypotensive and we had backed off on his medications. He did well for a week or so and then ended up in the hospital in congestive heart failure. He was diuresed and we saw him in followup on March 16, 2013, and he was doing better. It appears that a good weight for him is approximately 191 pounds. We gave is family instructions to give him an extra 1/2 of Lasix if his weight went up 3 pounds. If it was still up 3 pounds the next day they were to give an extra whole Lasix.               He is here for follow up. He has occasional angina for a couple days in a row then he wont have any for two weeks. He is struggling with severe spinal stenosis. He is being followed by Dr [name] and is to start a new pain medications today. His breathing is at baseline.      Current Outpatient Prescriptions  Medication Sig Dispense Refill  . acetaminophen (TYLENOL) 650 MG CR tablet Take 650 mg by mouth every 8 (eight) hours as needed. For pain      . aspirin 81 MG tablet Take 81 mg by mouth daily.      . buprenorphine (BUTRANS) 5 MCG/HR PTWK patch Place 5 mcg onto the skin once a week.      . calcium carbonate (OS-CAL) 600 MG TABS Take 600 mg by mouth daily.      [name] diltiazem (CARDIZEM CD) 120 MG 24 hr capsule TAKE 1 CAPSULE (120 MG TOTAL) BY MOUTH DAILY.  90 capsule  2  . diltiazem (CARDIZEM CD) 240 MG 24 hr capsule TAKE 1 CAPSULE (240 MG TOTAL) BY MOUTH DAILY.  90 capsule  2  . fluticasone (FLONASE) 50 MCG/ACT nasal spray Place 2 sprays into both nostrils daily.       . furosemide (LASIX) 40 MG tablet Take 1 tablet (40 mg  total) by mouth daily. May also take an extra Lasix 40 mg if weight increases by 3-5 pounds  110 tablet  3  . hydrocortisone (PROCTOZONE-HC) 2.5 % rectal cream Place rectally 2 (two) times daily.  30 g  0  . isosorbide mononitrate (IMDUR) 30 MG 24 hr tablet Take 1 tablet (30 mg total) by mouth daily at 3 pm.  30 tablet  10  . LANTUS SOLOSTAR 100 UNIT/ML Solostar Pen Inject 20 Units into the skin daily.      [name] levothyroxine (SYNTHROID, LEVOTHROID) 150 MCG tablet Take 150 mcg by mouth daily.      . metoprolol tartrate (LOPRESSOR) 25 MG tablet Take, 1.5 tablets in the morning and 1 tablet in the evening  225 tablet  2  . nitroGLYCERIN (NITROSTAT) 0.4 MG SL tablet Place 1 tablet (0.4 mg total) under the tongue every 5 (five) minutes as needed for chest pain.  100 tablet  12  . nystatin cream (MYCOSTATIN) Apply as needed      . Omega-3 Fatty Acids (FISH OIL) 1000 MG CAPS  Take 3,000 mg by mouth every morning.      [name] omeprazole (PRILOSEC) 20 MG capsule Take 20 mg by mouth daily.      . predniSONE (DELTASONE) 10 MG tablet Take 10 mg by mouth daily with breakfast.      . rosuvastatin (CRESTOR) 10 MG tablet Take 10 mg by mouth daily.      . saxagliptin HCl (ONGLYZA) 5 MG TABS tablet Take 5 mg by mouth daily.      [name] triamcinolone cream (KENALOG) 0.1 % Apply 1 application topically daily.       [name] warfarin (COUMADIN) 5 MG tablet Take 1-2 tablets by mouth daily as directed  135 tablet  2   No current facility-administered medications for this visit.    Allergies  Allergen Reactions  . Clarithromycin Other (See Comments)    Unknown, thinks "blisters in mouth"  . Influenza Vaccines Other (See Comments)    Severe coughing for weeks after vaccine; also had flu like symptoms for about 7 to 8 weeks.   . Lanoxin [Digoxin] Other (See Comments)    Nausea, anorexia  . Lisinopril Other (See Comments)    cough  . Ranexa [Ranolazine Er]   . Statins     History   Social History  . Marital Status: Married     Spouse Name: N/A    Number of Children: N/A  . Years of Education: N/A   Occupational History  . Not on file.   Social History Main Topics  . Smoking status: Never Smoker   . Smokeless tobacco: Never Used  . Alcohol Use: No  . Drug Use: No  . Sexual Activity: Not on file   Other Topics Concern  . Not on file   Social History Narrative  . No narrative on file     Review of Systems: General: negative for chills, fever, night sweats or weight changes.  Cardiovascular: negative for chest pain, dyspnea on exertion, edema, orthopnea, palpitations, paroxysmal nocturnal dyspnea or shortness of breath Dermatological: negative for rash Respiratory: negative for cough or wheezing Urologic: negative for hematuria Abdominal: negative for nausea, vomiting, diarrhea, bright red blood per rectum, melena, or hematemesis Neurologic: negative for visual changes, syncope, or dizziness All other systems reviewed and are otherwise negative except as noted above.    Blood pressure 122/66, pulse 76, height 6' (1.829 m), weight 219 lb 4.8 oz (99.474 kg).  General appearance: alert, cooperative and no distress Neck: no carotid bruit and no JVD Lungs: basilar crackles Heart: irregularly irregular rhythm  EKG AF, LBBB  ASSESSMENT AND PLAN:   CAD, CABG '98, cath 01/14/13- medical Rx He gets chest pain off and on- NTG Sl helps  CAF Rate stable  Chronic anticoagulation INR 2.2.  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  DYSLIPIDEMIA Statin intol (elevated LFTs)  Diabetes mellitus Type 2 NIDDM   PLAN  I reviewed his medications with Dr [name]. We will stop his Plavix, continue Coumadin and ASA. I  suggested he could increase his Imdur to 60 mg daily if his chest pain becomes more persistent. He can see Dr [name] in 6 months.   [name] KPA-C 08/15/2014 2:23 PM

## 2014-08-15 NOTE — Assessment & Plan Note (Signed)
INR 2.2

## 2014-08-15 NOTE — Assessment & Plan Note (Signed)
He gets chest pain off and on- NTG Sl helps

## 2014-08-15 NOTE — Assessment & Plan Note (Signed)
Chronic back and leg pain.

## 2014-08-15 NOTE — Assessment & Plan Note (Signed)
No CHF on exam 

## 2014-08-15 NOTE — Assessment & Plan Note (Signed)
Statin intol (elevated LFTs)

## 2014-08-16 NOTE — Addendum Note (Signed)
Addended by: Abram Sander on: 08/16/2014 04:23 PM   Modules accepted: Orders

## 2014-09-12 ENCOUNTER — Ambulatory Visit (INDEPENDENT_AMBULATORY_CARE_PROVIDER_SITE_OTHER): Payer: Medicare Other | Admitting: Pharmacist Clinician (PhC)/ Clinical Pharmacy Specialist

## 2014-09-12 DIAGNOSIS — Z7901 Long term (current) use of anticoagulants: Secondary | ICD-10-CM

## 2014-09-12 DIAGNOSIS — I4821 Permanent atrial fibrillation: Secondary | ICD-10-CM

## 2014-09-12 DIAGNOSIS — I4891 Unspecified atrial fibrillation: Secondary | ICD-10-CM

## 2014-09-12 LAB — POCT INR: INR: 2.5

## 2014-09-28 ENCOUNTER — Other Ambulatory Visit: Payer: Self-pay | Admitting: Cardiology

## 2014-09-28 NOTE — Telephone Encounter (Signed)
Diltiazem 120mg  and 240mg  Refilled # 90 tablets with 2 refilled on 07/03/14

## 2014-10-01 ENCOUNTER — Other Ambulatory Visit: Payer: Self-pay | Admitting: Cardiology

## 2014-10-01 NOTE — Telephone Encounter (Signed)
Rx was sent to pharmacy electronically. 

## 2014-10-03 ENCOUNTER — Other Ambulatory Visit: Payer: Self-pay | Admitting: Cardiology

## 2014-10-03 NOTE — Telephone Encounter (Signed)
E sent to pharmacy 

## 2014-10-10 ENCOUNTER — Ambulatory Visit (INDEPENDENT_AMBULATORY_CARE_PROVIDER_SITE_OTHER): Payer: Medicare Other | Admitting: Pharmacist Clinician (PhC)/ Clinical Pharmacy Specialist

## 2014-10-10 DIAGNOSIS — Z7901 Long term (current) use of anticoagulants: Secondary | ICD-10-CM

## 2014-10-10 DIAGNOSIS — I482 Chronic atrial fibrillation: Secondary | ICD-10-CM

## 2014-10-10 DIAGNOSIS — I4821 Permanent atrial fibrillation: Secondary | ICD-10-CM

## 2014-10-10 LAB — POCT INR: INR: 3.3

## 2014-10-10 MED ORDER — WARFARIN SODIUM 5 MG PO TABS: ORAL_TABLET | ORAL | Status: DC

## 2014-10-14 ENCOUNTER — Emergency Department (HOSPITAL_COMMUNITY): Payer: Medicare Other

## 2014-10-14 ENCOUNTER — Inpatient Hospital Stay (HOSPITAL_COMMUNITY)
Admission: EM | Admit: 2014-10-14 | Discharge: 2014-10-23 | DRG: 270 | Disposition: A | Discharge status: Home Health | Payer: Medicare Other | Attending: Internal Medicine | Admitting: Internal Medicine

## 2014-10-14 ENCOUNTER — Encounter (HOSPITAL_COMMUNITY): Payer: Self-pay | Admitting: Family Medicine

## 2014-10-14 DIAGNOSIS — I255 Ischemic cardiomyopathy: Secondary | ICD-10-CM

## 2014-10-14 DIAGNOSIS — Z7982 Long term (current) use of aspirin: Secondary | ICD-10-CM

## 2014-10-14 DIAGNOSIS — I272 Other secondary pulmonary hypertension: Secondary | ICD-10-CM | POA: Diagnosis present

## 2014-10-14 DIAGNOSIS — G8929 Other chronic pain: Secondary | ICD-10-CM | POA: Diagnosis present

## 2014-10-14 DIAGNOSIS — IMO0002 Reserved for concepts with insufficient information to code with codable children: Secondary | ICD-10-CM

## 2014-10-14 DIAGNOSIS — I472 Ventricular tachycardia: Secondary | ICD-10-CM

## 2014-10-14 DIAGNOSIS — K219 Gastro-esophageal reflux disease without esophagitis: Secondary | ICD-10-CM | POA: Diagnosis present

## 2014-10-14 DIAGNOSIS — M4800 Spinal stenosis, site unspecified: Secondary | ICD-10-CM | POA: Diagnosis present

## 2014-10-14 DIAGNOSIS — I252 Old myocardial infarction: Secondary | ICD-10-CM

## 2014-10-14 DIAGNOSIS — J440 Chronic obstructive pulmonary disease with acute lower respiratory infection: Secondary | ICD-10-CM | POA: Diagnosis present

## 2014-10-14 DIAGNOSIS — E1165 Type 2 diabetes mellitus with hyperglycemia: Secondary | ICD-10-CM | POA: Diagnosis present

## 2014-10-14 DIAGNOSIS — Z8249 Family history of ischemic heart disease and other diseases of the circulatory system: Secondary | ICD-10-CM | POA: Diagnosis not present

## 2014-10-14 DIAGNOSIS — R630 Anorexia: Secondary | ICD-10-CM

## 2014-10-14 DIAGNOSIS — I482 Chronic atrial fibrillation: Secondary | ICD-10-CM | POA: Diagnosis present

## 2014-10-14 DIAGNOSIS — M549 Dorsalgia, unspecified: Secondary | ICD-10-CM | POA: Diagnosis present

## 2014-10-14 DIAGNOSIS — R0602 Shortness of breath: Secondary | ICD-10-CM

## 2014-10-14 DIAGNOSIS — I5023 Acute on chronic systolic (congestive) heart failure: Secondary | ICD-10-CM | POA: Diagnosis present

## 2014-10-14 DIAGNOSIS — R0902 Hypoxemia: Secondary | ICD-10-CM

## 2014-10-14 DIAGNOSIS — I25810 Atherosclerosis of coronary artery bypass graft(s) without angina pectoris: Secondary | ICD-10-CM | POA: Diagnosis present

## 2014-10-14 DIAGNOSIS — E785 Hyperlipidemia, unspecified: Secondary | ICD-10-CM | POA: Diagnosis present

## 2014-10-14 DIAGNOSIS — I129 Hypertensive chronic kidney disease with stage 1 through stage 4 chronic kidney disease, or unspecified chronic kidney disease: Secondary | ICD-10-CM | POA: Diagnosis present

## 2014-10-14 DIAGNOSIS — Z79899 Other long term (current) drug therapy: Secondary | ICD-10-CM | POA: Diagnosis not present

## 2014-10-14 DIAGNOSIS — I447 Left bundle-branch block, unspecified: Secondary | ICD-10-CM | POA: Diagnosis present

## 2014-10-14 DIAGNOSIS — E039 Hypothyroidism, unspecified: Secondary | ICD-10-CM | POA: Diagnosis present

## 2014-10-14 DIAGNOSIS — R531 Weakness: Secondary | ICD-10-CM

## 2014-10-14 DIAGNOSIS — J189 Pneumonia, unspecified organism: Secondary | ICD-10-CM

## 2014-10-14 DIAGNOSIS — I501 Left ventricular failure: Secondary | ICD-10-CM | POA: Diagnosis present

## 2014-10-14 DIAGNOSIS — I25719 Atherosclerosis of autologous vein coronary artery bypass graft(s) with unspecified angina pectoris: Secondary | ICD-10-CM | POA: Diagnosis present

## 2014-10-14 DIAGNOSIS — M1990 Unspecified osteoarthritis, unspecified site: Secondary | ICD-10-CM | POA: Diagnosis present

## 2014-10-14 DIAGNOSIS — R532 Functional quadriplegia: Secondary | ICD-10-CM | POA: Diagnosis present

## 2014-10-14 DIAGNOSIS — Z7952 Long term (current) use of systemic steroids: Secondary | ICD-10-CM

## 2014-10-14 DIAGNOSIS — E08329 Diabetes mellitus due to underlying condition with mild nonproliferative diabetic retinopathy without macular edema: Secondary | ICD-10-CM

## 2014-10-14 DIAGNOSIS — N179 Acute kidney failure, unspecified: Secondary | ICD-10-CM | POA: Diagnosis present

## 2014-10-14 DIAGNOSIS — Z9889 Other specified postprocedural states: Secondary | ICD-10-CM

## 2014-10-14 DIAGNOSIS — I214 Non-ST elevation (NSTEMI) myocardial infarction: Principal | ICD-10-CM

## 2014-10-14 DIAGNOSIS — I2510 Atherosclerotic heart disease of native coronary artery without angina pectoris: Secondary | ICD-10-CM | POA: Diagnosis present

## 2014-10-14 DIAGNOSIS — I5022 Chronic systolic (congestive) heart failure: Secondary | ICD-10-CM

## 2014-10-14 DIAGNOSIS — J810 Acute pulmonary edema: Secondary | ICD-10-CM | POA: Insufficient documentation

## 2014-10-14 DIAGNOSIS — N182 Chronic kidney disease, stage 2 (mild): Secondary | ICD-10-CM | POA: Diagnosis present

## 2014-10-14 DIAGNOSIS — J441 Chronic obstructive pulmonary disease with (acute) exacerbation: Secondary | ICD-10-CM

## 2014-10-14 DIAGNOSIS — I4821 Permanent atrial fibrillation: Secondary | ICD-10-CM

## 2014-10-14 DIAGNOSIS — Z794 Long term (current) use of insulin: Secondary | ICD-10-CM

## 2014-10-14 DIAGNOSIS — E1122 Type 2 diabetes mellitus with diabetic chronic kidney disease: Secondary | ICD-10-CM | POA: Diagnosis present

## 2014-10-14 DIAGNOSIS — Z7901 Long term (current) use of anticoagulants: Secondary | ICD-10-CM

## 2014-10-14 DIAGNOSIS — R5381 Other malaise: Secondary | ICD-10-CM | POA: Diagnosis not present

## 2014-10-14 DIAGNOSIS — E119 Type 2 diabetes mellitus without complications: Secondary | ICD-10-CM | POA: Diagnosis present

## 2014-10-14 DIAGNOSIS — I10 Essential (primary) hypertension: Secondary | ICD-10-CM | POA: Diagnosis present

## 2014-10-14 DIAGNOSIS — R570 Cardiogenic shock: Secondary | ICD-10-CM | POA: Diagnosis not present

## 2014-10-14 DIAGNOSIS — E1139 Type 2 diabetes mellitus with other diabetic ophthalmic complication: Secondary | ICD-10-CM | POA: Insufficient documentation

## 2014-10-14 DIAGNOSIS — I4729 Other ventricular tachycardia: Secondary | ICD-10-CM

## 2014-10-14 DIAGNOSIS — J986 Disorders of diaphragm: Secondary | ICD-10-CM

## 2014-10-14 DIAGNOSIS — I4891 Unspecified atrial fibrillation: Secondary | ICD-10-CM

## 2014-10-14 DIAGNOSIS — J811 Chronic pulmonary edema: Secondary | ICD-10-CM | POA: Diagnosis not present

## 2014-10-14 DIAGNOSIS — J9601 Acute respiratory failure with hypoxia: Secondary | ICD-10-CM

## 2014-10-14 DIAGNOSIS — R079 Chest pain, unspecified: Secondary | ICD-10-CM

## 2014-10-14 LAB — GLUCOSE, CAPILLARY
GLUCOSE-CAPILLARY: 282 mg/dL — AB (ref 70–99)
Glucose-Capillary: 205 mg/dL — ABNORMAL HIGH (ref 70–99)
Glucose-Capillary: 256 mg/dL — ABNORMAL HIGH (ref 70–99)
Glucose-Capillary: 219 mg/dL — ABNORMAL HIGH (ref 70–99)

## 2014-10-14 LAB — CBC WITH DIFFERENTIAL/PLATELET
BASOS ABS: 0 10*3/uL (ref 0.0–0.1)
BASOS PCT: 0 % (ref 0–1)
EOS ABS: 0.1 10*3/uL (ref 0.0–0.7)
EOS PCT: 1 % (ref 0–5)
HEMATOCRIT: 45.6 % (ref 39.0–52.0)
HEMOGLOBIN: 15.3 g/dL (ref 13.0–17.0)
Lymphocytes Relative: 7 % — ABNORMAL LOW (ref 12–46)
Lymphs Abs: 0.8 10*3/uL (ref 0.7–4.0)
MCH: 32 pg (ref 26.0–34.0)
MCHC: 33.6 g/dL (ref 30.0–36.0)
MCV: 95.4 fL (ref 78.0–100.0)
MONO ABS: 1.2 10*3/uL — AB (ref 0.1–1.0)
MONOS PCT: 9 % (ref 3–12)
Neutro Abs: 10.6 10*3/uL — ABNORMAL HIGH (ref 1.7–7.7)
Neutrophils Relative %: 83 % — ABNORMAL HIGH (ref 43–77)
Platelets: 145 10*3/uL — ABNORMAL LOW (ref 150–400)
RBC: 4.78 MIL/uL (ref 4.22–5.81)
RDW: 15.4 % (ref 11.5–15.5)
WBC: 12.7 10*3/uL — ABNORMAL HIGH (ref 4.0–10.5)

## 2014-10-14 LAB — CREATININE, SERUM
CREATININE: 1.15 mg/dL (ref 0.50–1.35)
GFR, EST AFRICAN AMERICAN: 70 mL/min — AB (ref >90)
GFR, EST NON AFRICAN AMERICAN: 60 mL/min — AB (ref >90)

## 2014-10-14 LAB — PROTIME-INR
INR: 2.63 — ABNORMAL HIGH (ref 0.00–1.49)
PROTHROMBIN TIME: 28.3 s — AB (ref 11.6–15.2)

## 2014-10-14 LAB — COMPREHENSIVE METABOLIC PANEL
ALBUMIN: 3.3 g/dL — AB (ref 3.5–5.2)
ALT: 46 U/L (ref 0–53)
AST: 22 U/L (ref 0–37)
Alkaline Phosphatase: 85 U/L (ref 39–117)
Anion gap: 16 — ABNORMAL HIGH (ref 5–15)
BILIRUBIN TOTAL: 0.9 mg/dL (ref 0.3–1.2)
BUN: 19 mg/dL (ref 6–23)
CALCIUM: 9.8 mg/dL (ref 8.4–10.5)
CO2: 25 mEq/L (ref 19–32)
CREATININE: 1.1 mg/dL (ref 0.50–1.35)
Chloride: 96 mEq/L (ref 96–112)
GFR calc Af Amer: 74 mL/min — ABNORMAL LOW (ref >90)
GFR calc non Af Amer: 64 mL/min — ABNORMAL LOW (ref >90)
Glucose, Bld: 202 mg/dL — ABNORMAL HIGH (ref 70–99)
Potassium: 3.8 mEq/L (ref 3.7–5.3)
Sodium: 137 mEq/L (ref 137–147)
Total Protein: 6.6 g/dL (ref 6.0–8.3)

## 2014-10-14 LAB — CBC
HCT: 43.3 % (ref 39.0–52.0)
HEMOGLOBIN: 14.6 g/dL (ref 13.0–17.0)
MCH: 32.2 pg (ref 26.0–34.0)
MCHC: 33.7 g/dL (ref 30.0–36.0)
MCV: 95.6 fL (ref 78.0–100.0)
Platelets: 143 10*3/uL — ABNORMAL LOW (ref 150–400)
RBC: 4.53 MIL/uL (ref 4.22–5.81)
RDW: 15.4 % (ref 11.5–15.5)
WBC: 10.6 10*3/uL — AB (ref 4.0–10.5)

## 2014-10-14 LAB — MRSA PCR SCREENING: MRSA by PCR: NEGATIVE

## 2014-10-14 LAB — I-STAT TROPONIN, ED: Troponin i, poc: 0.05 ng/mL (ref 0.00–0.08)

## 2014-10-14 LAB — I-STAT CG4 LACTIC ACID, ED: Lactic Acid, Venous: 2.49 mmol/L — ABNORMAL HIGH (ref 0.5–2.2)

## 2014-10-14 LAB — HIV ANTIBODY (ROUTINE TESTING W REFLEX): HIV 1&2 Ab, 4th Generation: NONREACTIVE

## 2014-10-14 LAB — PRO B NATRIURETIC PEPTIDE: Pro B Natriuretic peptide (BNP): 2372 pg/mL — ABNORMAL HIGH (ref 0–450)

## 2014-10-14 LAB — STREP PNEUMONIAE URINARY ANTIGEN: Strep Pneumo Urinary Antigen: NEGATIVE

## 2014-10-14 MED ORDER — DOXYCYCLINE HYCLATE 100 MG PO TABS: 100.0000 mg | ORAL_TABLET | Freq: Two times a day (BID) | ORAL | Status: DC

## 2014-10-14 MED ORDER — DEXTROSE 5 % IV SOLN
1.0000 g | INTRAVENOUS | Status: DC
  Administered 2014-10-14: 1 g via INTRAVENOUS
  Filled 2014-10-14: qty 10

## 2014-10-14 MED ORDER — ROSUVASTATIN CALCIUM 10 MG PO TABS
10.0000 mg | ORAL_TABLET | Freq: Every day | ORAL | Status: DC
  Administered 2014-10-14 – 2014-10-15 (×2): 10 mg via ORAL
  Filled 2014-10-14 (×2): qty 1

## 2014-10-14 MED ORDER — POLYETHYLENE GLYCOL 3350 17 G PO PACK: 17.0000 g | PACK | Freq: Every day | ORAL | Status: DC | PRN

## 2014-10-14 MED ORDER — ACETAMINOPHEN 325 MG PO TABS
650.0000 mg | ORAL_TABLET | Freq: Four times a day (QID) | ORAL | Status: DC | PRN
  Administered 2014-10-18 – 2014-10-23 (×7): 650 mg via ORAL
  Filled 2014-10-14 (×9): qty 2

## 2014-10-14 MED ORDER — ONDANSETRON HCL 4 MG/2ML IJ SOLN
4.0000 mg | Freq: Four times a day (QID) | INTRAMUSCULAR | Status: DC | PRN
  Administered 2014-10-14: 4 mg via INTRAVENOUS
  Filled 2014-10-14: qty 2

## 2014-10-14 MED ORDER — METOPROLOL TARTRATE 25 MG PO TABS
25.0000 mg | ORAL_TABLET | Freq: Every day | ORAL | Status: DC
Start: 2014-10-14 — End: 2014-10-15
  Administered 2014-10-14: 25 mg via ORAL
  Filled 2014-10-14 (×2): qty 1

## 2014-10-14 MED ORDER — PREDNISONE 10 MG PO TABS
10.0000 mg | ORAL_TABLET | Freq: Every day | ORAL | Status: DC
  Administered 2014-10-15 – 2014-10-23 (×9): 10 mg via ORAL
  Filled 2014-10-14 (×11): qty 1

## 2014-10-14 MED ORDER — ASPIRIN 81 MG PO TABS
81.0000 mg | ORAL_TABLET | Freq: Every day | ORAL | Status: DC
Start: 2014-10-14 — End: 2014-10-14

## 2014-10-14 MED ORDER — INSULIN ASPART 100 UNIT/ML ~~LOC~~ SOLN
0.0000 [IU] | Freq: Three times a day (TID) | SUBCUTANEOUS | Status: DC
  Administered 2014-10-14: 5 [IU] via SUBCUTANEOUS
  Administered 2014-10-14: 3 [IU] via SUBCUTANEOUS
  Administered 2014-10-15: 5 [IU] via SUBCUTANEOUS

## 2014-10-14 MED ORDER — DOXYCYCLINE HYCLATE 100 MG PO TABS
100.0000 mg | ORAL_TABLET | Freq: Two times a day (BID) | ORAL | Status: DC
  Filled 2014-10-14 (×2): qty 1

## 2014-10-14 MED ORDER — DILTIAZEM HCL ER COATED BEADS 240 MG PO CP24: 240.0000 mg | ORAL_CAPSULE | Freq: Every day | ORAL | Status: DC

## 2014-10-14 MED ORDER — CEFEPIME HCL 1 G IJ SOLR
1.0000 g | Freq: Two times a day (BID) | INTRAMUSCULAR | Status: DC
  Filled 2014-10-14: qty 1

## 2014-10-14 MED ORDER — METOPROLOL TARTRATE 25 MG PO TABS
25.0000 mg | ORAL_TABLET | Freq: Two times a day (BID) | ORAL | Status: DC
Start: 2014-10-14 — End: 2014-10-14
  Filled 2014-10-14 (×2): qty 1.5

## 2014-10-14 MED ORDER — DILTIAZEM LOAD VIA INFUSION
10.0000 mg | Freq: Once | INTRAVENOUS | Status: AC
  Administered 2014-10-14: 10 mg via INTRAVENOUS
  Filled 2014-10-14: qty 10

## 2014-10-14 MED ORDER — HEPARIN SODIUM (PORCINE) 5000 UNIT/ML IJ SOLN
5000.0000 [IU] | Freq: Three times a day (TID) | INTRAMUSCULAR | Status: DC
  Administered 2014-10-14 – 2014-10-15 (×4): 5000 [IU] via SUBCUTANEOUS
  Filled 2014-10-14 (×5): qty 1

## 2014-10-14 MED ORDER — FUROSEMIDE 10 MG/ML IJ SOLN
40.0000 mg | Freq: Two times a day (BID) | INTRAMUSCULAR | Status: DC
  Administered 2014-10-14: 40 mg via INTRAVENOUS
  Filled 2014-10-14: qty 4

## 2014-10-14 MED ORDER — DILTIAZEM HCL ER COATED BEADS 120 MG PO CP24
120.0000 mg | ORAL_CAPSULE | Freq: Every day | ORAL | Status: DC
  Administered 2014-10-14: 120 mg via ORAL
  Filled 2014-10-14: qty 1

## 2014-10-14 MED ORDER — HYDROCODONE-ACETAMINOPHEN 5-325 MG PO TABS
1.0000 | ORAL_TABLET | ORAL | Status: DC | PRN
  Administered 2014-10-15 – 2014-10-18 (×4): 1 via ORAL
  Administered 2014-10-19: 2 via ORAL
  Administered 2014-10-20: 1 via ORAL
  Administered 2014-10-21: 2 via ORAL
  Filled 2014-10-14 (×5): qty 1
  Filled 2014-10-14 (×2): qty 2

## 2014-10-14 MED ORDER — PANTOPRAZOLE SODIUM 40 MG PO TBEC: 40.0000 mg | DELAYED_RELEASE_TABLET | Freq: Every day | ORAL | Status: DC

## 2014-10-14 MED ORDER — VANCOMYCIN HCL 10 G IV SOLR
1250.0000 mg | Freq: Two times a day (BID) | INTRAVENOUS | Status: DC
  Administered 2014-10-14 – 2014-10-15 (×2): 1250 mg via INTRAVENOUS
  Filled 2014-10-14 (×4): qty 1250

## 2014-10-14 MED ORDER — FUROSEMIDE 40 MG PO TABS: 40.0000 mg | ORAL_TABLET | Freq: Two times a day (BID) | ORAL | Status: DC

## 2014-10-14 MED ORDER — SODIUM CHLORIDE 0.9 % IJ SOLN
3.0000 mL | Freq: Two times a day (BID) | INTRAMUSCULAR | Status: DC
  Administered 2014-10-14: 3 mL via INTRAVENOUS

## 2014-10-14 MED ORDER — LEVOTHYROXINE SODIUM 175 MCG PO TABS
175.0000 ug | ORAL_TABLET | Freq: Every day | ORAL | Status: DC
  Administered 2014-10-15 – 2014-10-23 (×8): 175 ug via ORAL
  Filled 2014-10-14 (×9): qty 1

## 2014-10-14 MED ORDER — FLUTICASONE PROPIONATE 50 MCG/ACT NA SUSP
2.0000 | Freq: Every day | NASAL | Status: DC
  Administered 2014-10-14 – 2014-10-23 (×8): 2 via NASAL
  Filled 2014-10-14 (×2): qty 16

## 2014-10-14 MED ORDER — INSULIN GLARGINE 100 UNIT/ML SOLOSTAR PEN: 32.0000 [IU] | PEN_INJECTOR | Freq: Every day | SUBCUTANEOUS | Status: DC

## 2014-10-14 MED ORDER — ACETAMINOPHEN 650 MG RE SUPP
650.0000 mg | Freq: Four times a day (QID) | RECTAL | Status: DC | PRN
  Administered 2014-10-14: 650 mg via RECTAL
  Filled 2014-10-14: qty 1

## 2014-10-14 MED ORDER — SODIUM CHLORIDE 0.9 % IJ SOLN
3.0000 mL | Freq: Two times a day (BID) | INTRAMUSCULAR | Status: DC
  Administered 2014-10-14 – 2014-10-15 (×3): 3 mL via INTRAVENOUS

## 2014-10-14 MED ORDER — PANTOPRAZOLE SODIUM 40 MG PO TBEC
40.0000 mg | DELAYED_RELEASE_TABLET | Freq: Two times a day (BID) | ORAL | Status: DC
  Administered 2014-10-14 – 2014-10-23 (×18): 40 mg via ORAL
  Filled 2014-10-14 (×18): qty 1

## 2014-10-14 MED ORDER — DILTIAZEM HCL ER COATED BEADS 120 MG PO CP24
120.0000 mg | ORAL_CAPSULE | Freq: Once | ORAL | Status: AC
  Administered 2014-10-14: 120 mg via ORAL
  Filled 2014-10-14: qty 1

## 2014-10-14 MED ORDER — ASPIRIN 81 MG PO CHEW
324.0000 mg | CHEWABLE_TABLET | Freq: Once | ORAL | Status: AC
  Administered 2014-10-14: 324 mg via ORAL
  Filled 2014-10-14: qty 4

## 2014-10-14 MED ORDER — SODIUM CHLORIDE 0.9 % IV SOLN: 250.0000 mL | INTRAVENOUS | Status: DC | PRN

## 2014-10-14 MED ORDER — SODIUM CHLORIDE 0.9 % IJ SOLN: 3.0000 mL | INTRAMUSCULAR | Status: DC | PRN

## 2014-10-14 MED ORDER — METOPROLOL TARTRATE 25 MG PO TABS
37.5000 mg | ORAL_TABLET | Freq: Every day | ORAL | Status: DC
  Administered 2014-10-14 – 2014-10-15 (×2): 37.5 mg via ORAL
  Filled 2014-10-14 (×2): qty 1

## 2014-10-14 MED ORDER — LINAGLIPTIN 5 MG PO TABS
5.0000 mg | ORAL_TABLET | Freq: Every day | ORAL | Status: DC
  Administered 2014-10-14 – 2014-10-23 (×9): 5 mg via ORAL
  Filled 2014-10-14 (×10): qty 1

## 2014-10-14 MED ORDER — INSULIN ASPART 100 UNIT/ML ~~LOC~~ SOLN
0.0000 [IU] | Freq: Every day | SUBCUTANEOUS | Status: DC
  Administered 2014-10-14: 3 [IU] via SUBCUTANEOUS

## 2014-10-14 MED ORDER — INSULIN GLARGINE 100 UNIT/ML ~~LOC~~ SOLN
32.0000 [IU] | Freq: Every day | SUBCUTANEOUS | Status: DC
  Administered 2014-10-14 – 2014-10-15 (×2): 32 [IU] via SUBCUTANEOUS
  Filled 2014-10-14 (×3): qty 0.32

## 2014-10-14 MED ORDER — DILTIAZEM HCL ER COATED BEADS 240 MG PO CP24
240.0000 mg | ORAL_CAPSULE | Freq: Every day | ORAL | Status: DC
  Filled 2014-10-14: qty 1

## 2014-10-14 MED ORDER — DEXTROSE 5 % IV SOLN
100.0000 mg | Freq: Once | INTRAVENOUS | Status: AC
  Administered 2014-10-14: 100 mg via INTRAVENOUS
  Filled 2014-10-14: qty 100

## 2014-10-14 MED ORDER — NITROGLYCERIN 0.4 MG SL SUBL
0.4000 mg | SUBLINGUAL_TABLET | SUBLINGUAL | Status: DC | PRN
Start: 2014-10-14 — End: 2014-10-23
  Administered 2014-10-14 – 2014-10-20 (×4): 0.4 mg via SUBLINGUAL
  Filled 2014-10-14 (×4): qty 1

## 2014-10-14 MED ORDER — DEXTROSE 5 % IV SOLN
5.0000 mg/h | INTRAVENOUS | Status: DC
  Administered 2014-10-14 (×2): 10 mg/h via INTRAVENOUS
  Filled 2014-10-14: qty 100

## 2014-10-14 MED ORDER — ASPIRIN EC 81 MG PO TBEC
81.0000 mg | DELAYED_RELEASE_TABLET | Freq: Every day | ORAL | Status: DC
  Administered 2014-10-14 – 2014-10-23 (×9): 81 mg via ORAL
  Filled 2014-10-14 (×10): qty 1

## 2014-10-14 MED ORDER — ISOSORBIDE MONONITRATE ER 30 MG PO TB24
30.0000 mg | ORAL_TABLET | Freq: Every day | ORAL | Status: DC
  Administered 2014-10-14: 30 mg via ORAL
  Filled 2014-10-14 (×2): qty 1

## 2014-10-14 MED ORDER — DEXTROSE 5 % IV SOLN
1.0000 g | Freq: Three times a day (TID) | INTRAVENOUS | Status: DC
  Administered 2014-10-14 – 2014-10-18 (×12): 1 g via INTRAVENOUS
  Filled 2014-10-14 (×14): qty 1

## 2014-10-14 MED ORDER — DEXTROSE 5 % IV SOLN
1.0000 g | Freq: Once | INTRAVENOUS | Status: AC
  Administered 2014-10-14: 1 g via INTRAVENOUS
  Filled 2014-10-14: qty 10

## 2014-10-14 MED ORDER — BUPRENORPHINE 5 MCG/HR TD PTWK: 5.0000 ug | MEDICATED_PATCH | TRANSDERMAL | Status: DC

## 2014-10-14 MED ORDER — HYDROCORTISONE 2.5 % RE CREA
TOPICAL_CREAM | Freq: Two times a day (BID) | RECTAL | Status: DC
  Filled 2014-10-14: qty 28.35

## 2014-10-14 MED ORDER — ONDANSETRON HCL 4 MG PO TABS: 4.0000 mg | ORAL_TABLET | Freq: Four times a day (QID) | ORAL | Status: DC | PRN

## 2014-10-14 MED ORDER — INSULIN ASPART 100 UNIT/ML ~~LOC~~ SOLN
3.0000 [IU] | Freq: Three times a day (TID) | SUBCUTANEOUS | Status: DC
  Administered 2014-10-15 – 2014-10-19 (×12): 3 [IU] via SUBCUTANEOUS

## 2014-10-14 NOTE — Progress Notes (Signed)
Patient ambulated to bathroom with family, despite education to call for RN or NT for help to [facility].  Once on toilet, patient's family called RN to room stating patient "feels ill".  Patient had BM.  Patient's HR maintaining in 120s-140s, consistent throughout afternoon.  Patient stating "I don't feel good", but unable to describe symptoms.  Patient tachypnic and short of breath.  Able to discern that patient experiencing nausea.  Patient assisted back to bed with RN and NT.  Vital signs taken when patient returned to bed, RR 22, HR 116, BP 124/85, O2 sat 94% on room air.  PRN administered for nausea to good effect.  Patient resting comfortably in bed.  Patient and family again instructed to call for help when patient needing to void.  Bed alarm on.  Will continue to monitor.

## 2014-10-14 NOTE — Consult Note (Signed)
ANTIBIOTIC CONSULT NOTE - INITIAL  Pharmacy Consult for Vancomycin Indication: pneumonia  Allergies  Allergen Reactions  . Butrans [Buprenorphine] Shortness Of Breath  . Clarithromycin Other (See Comments)    Unknown, thinks "blisters in mouth"  . Influenza Vaccines Other (See Comments)    Severe coughing for weeks after vaccine; also had flu like symptoms for about 7 to 8 weeks.   . Lanoxin [Digoxin] Other (See Comments)    Nausea, anorexia  . Lisinopril Other (See Comments)    cough  . Ranexa [Ranolazine Er]   . Statins     Patient Measurements: Weight: 220 lb (99.791 kg)  Vital Signs: Temp: 102.5 F (39.2 C) (11/01 1700) Temp Source: Rectal (11/01 1700) BP: 104/70 mmHg (11/01 1718) Pulse Rate: 125 (11/01 1718) Intake/Output from previous day:   Intake/Output from this shift: Total I/O In: 543 [P.O.:240; I.V.:3; IV Piggyback:300] Out: 475 [Urine:475]  Labs:  Recent Labs  10/14/14 0944 10/14/14 1340  WBC 12.7* 10.6*  HGB 15.3 14.6  PLT 145* 143*  CREATININE 1.10 1.15   Estimated Creatinine Clearance: 67.9 mL/min (by C-G formula based on Cr of 1.15).  Microbiology: No results found for this or any previous visit (from the past 720 hour(s)).  Medical History: Past Medical History  Diagnosis Date  . Pleuritis     h/o------ with negative vats biopsy  . Acute myocardial infarction, unspecified site, episode of care unspecified   . Hypertension   . Other and unspecified hyperlipidemia   . Coronary artery disease   . Chronic ischemic heart disease, unspecified   . Diabetes mellitus   . 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 Chronic anticoagulation, on coumadin 01/12/2013  . Cardiomyopathy, ischemic EF 25% 01/12/2013    now improved to 40-45% 01/2013  . Anginal pain   . Hypothyroidism   . Shortness of breath 01/12/2013    2D Echo - EF 45-50, moderate concentric hypertrophy of the left ventricle  . GERD  (gastroesophageal reflux disease)   . Arthritis     all over  . LVF (left ventricular failure) 07/01/2012    2D Echo - EF 35-45, moderate concentric left ventricular hyperthrophy, LA severely dilated  . Atrial fibrillation 03/04/2011    Persistant; Persantine Cardiolite - inferolateral defect likely representing prior LCX infarct, study not gated  . Digitalis toxicity    Assessment: 73yom admitted earlier today with SOB and started on ceftriaxone and doxycycline. He has now developed worsening respiratory failure and is febrile to 102.5. Antibiotics will be broadened to vancomycin and cefepime and he will be transferred to stepdown. Renal function is stable.  Goal of Therapy:  Vancomycin trough level 15-20 mcg/ml  Plan:  1) Vancomycin 1250mg  IV q12 2) Change cefepime to 1g IV q8 3) Follow renal function, cultures, LOT, level if needed  10/14/2014,5:37 PM

## 2014-10-14 NOTE — ED Provider Notes (Signed)
CSN: [id number]     Arrival date & time 10/14/14  0906 History   First MD Initiated Contact with Patient 10/14/14 0930     Chief Complaint  Patient presents with  . Chest Pain    Patient is a 73 y.o. male presenting with chest pain. The history is provided by the patient.  Chest Pain Pain location:  Substernal area Pain quality: aching   Pain radiates to:  L arm Pain radiates to the back: no   Pain severity:  Severe Onset quality:  Sudden Duration:  1 week Timing:  Intermittent Progression:  Waxing and waning Chronicity:  Recurrent Context: at rest   Relieved by:  Nitroglycerin (however, never completely resolved the pain this week) Worsened by:  Nothing tried Ineffective treatments:  Nitroglycerin Associated symptoms: cough, diaphoresis and shortness of breath   Associated symptoms: no abdominal pain, no altered mental status, no anxiety, no dysphagia, no fever, no lower extremity edema, no nausea and not vomiting   Cough:    Cough characteristics:  Productive   Sputum characteristics:  White   Severity:  Moderate   Onset quality:  Gradual   Duration:  3 days   Timing:  Intermittent   Progression:  Worsening   Chronicity:  New Shortness of breath:    Severity:  Moderate   Onset quality:  Gradual   Duration:  3 days   Timing:  Constant   Progression:  Worsening Risk factors: coronary artery disease, diabetes mellitus and male sex   Risk factors: no aortic disease     Past Medical History  Diagnosis Date  . Pleuritis     h/o------ with negative vats biopsy  . Acute myocardial infarction, unspecified site, episode of care unspecified   . Hypertension   . Other and unspecified hyperlipidemia   . Coronary artery disease   . Chronic ischemic heart disease, unspecified   . Diabetes mellitus   . 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 Chronic anticoagulation, on coumadin 01/12/2013  . Cardiomyopathy, ischemic EF 25% 01/12/2013   now improved to 40-45% 01/2013  . Anginal pain   . Hypothyroidism   . Shortness of breath 01/12/2013    2D Echo - EF 45-50, moderate concentric hypertrophy of the left ventricle  . GERD (gastroesophageal reflux disease)   . Arthritis     all over  . LVF (left ventricular failure) 07/01/2012    2D Echo - EF 35-45, moderate concentric left ventricular hyperthrophy, LA severely dilated  . Atrial fibrillation 03/04/2011    Persistant; Persantine Cardiolite - inferolateral defect likely representing prior LCX infarct, study not gated  . Digitalis toxicity    Past Surgical History  Procedure Laterality Date  . Bypass graft    . Lung surgery    . Knee surgery    . Carpal tunnel release    . Cataract extraction    . Coronary artery bypass graft    . Cardiac catheterization Left 01/11/2013    Severe CAD with notable progression of disease in the SVG-RPDA followed by severe stenosis in grafted vessel, region not safely "reachede" for PCI, moderate ischemic CM no notable chage in EF, continue medical therapy  . Cardiac catheterization  04/03/2011    Left circumflex 95% stenosis, essentially unchanged from last cath  . Cardiac catheterization  04/01/2010    LAD 99% proximal stenosis with vague flow, circumflex 80% stenosis, essentially unchanged form last cath  . Cardiac catheterization  03/28/2010    Mild  pulmonary hypertension, medical therapy  . Cardiac catheterization  04/30/2009    LAD 95% stenosis, essentially unchanged fomr last cath, medical therapy  . Cardiac catheterization  12/05/2008    2.5x12 apex, predilation in the distal PLA branch, 3x23 XIENCE V durg eluting stent and post dilated with a 3.25x20 Roby VOYAGER at 15 atmospheres   Family History  Problem Relation Age of Onset  . Heart disease Mother   . Heart disease Father   . Heart disease Brother   . Heart disease Maternal Aunt   . Cancer     History  Substance Use Topics  . Smoking status: Never Smoker   . Smokeless tobacco:  Never Used  . Alcohol Use: No    Review of Systems  Constitutional: Positive for diaphoresis. Negative for fever.  HENT: Negative for trouble swallowing.   Respiratory: Positive for cough and shortness of breath.   Cardiovascular: Positive for chest pain.  Gastrointestinal: Negative for nausea, vomiting and abdominal pain.  All other systems reviewed and are negative.   Allergies  Clarithromycin; Influenza vaccines; Lanoxin; Lisinopril; Ranexa; and Statins  Home Medications   Prior to Admission medications   Medication Sig Start Date End Date Taking? Authorizing Provider  acetaminophen (TYLENOL) 650 MG CR tablet Take 650 mg by mouth every 8 (eight) hours as needed. For pain    Historical Provider, MD  aspirin 81 MG tablet Take 81 mg by mouth daily.    Historical Provider, MD  buprenorphine (BUTRANS) 5 MCG/HR PTWK patch Place 5 mcg onto the skin once a week.    Historical Provider, MD  calcium carbonate (OS-CAL) 600 MG TABS Take 600 mg by mouth daily.    Historical Provider, MD  diltiazem (CARDIZEM CD) 120 MG 24 hr capsule TAKE 1 CAPSULE (120 MG TOTAL) BY MOUTH DAILY.    Runell Gess, MD  diltiazem (CARDIZEM CD) 120 MG 24 hr capsule TAKE 1 CAPSULE (120 MG TOTAL) BY MOUTH DAILY. 10/03/14   Leone Brand, NP  diltiazem (CARDIZEM CD) 240 MG 24 hr capsule TAKE 1 CAPSULE (240 MG TOTAL) BY MOUTH DAILY.    Runell Gess, MD  fluticasone (FLONASE) 50 MCG/ACT nasal spray Place 2 sprays into both nostrils daily.     Historical Provider, MD  furosemide (LASIX) 40 MG tablet TAKE 1 TABLET BY MOUTH DAILY. MAY TAKE AN EXTRA LASIX 40MG  IF WEIGHT INCREASES BY 3-5 LBS 10/01/14   10/03/14, MD  hydrocortisone (PROCTOZONE-HC) 2.5 % rectal cream Place rectally 2 (two) times daily. 04/28/13   04/30/13, MD  isosorbide mononitrate (IMDUR) 30 MG 24 hr tablet Take 1 tablet (30 mg total) by mouth daily at 3 pm. 07/19/14   09/18/14, MD  LANTUS SOLOSTAR 100 UNIT/ML Solostar Pen Inject 20  Units into the skin daily. 02/01/14   Historical Provider, MD  levothyroxine (SYNTHROID, LEVOTHROID) 150 MCG tablet Take 150 mcg by mouth daily.    Historical Provider, MD  metoprolol tartrate (LOPRESSOR) 25 MG tablet Take, 1.5 tablets in the morning and 1 tablet in the evening 05/08/14   05/10/14, MD  nitroGLYCERIN (NITROSTAT) 0.4 MG SL tablet Place 1 tablet (0.4 mg total) under the tongue every 5 (five) minutes as needed for chest pain. 11/30/13   12/02/13, MD  nystatin cream (MYCOSTATIN) Apply as needed 12/12/13   Historical Provider, MD  Omega-3 Fatty Acids (FISH OIL) 1000 MG CAPS Take 3,000 mg by mouth every morning.    Historical Provider, MD  omeprazole (PRILOSEC) 20 MG capsule Take 20 mg by mouth daily.    Historical Provider, MD  predniSONE (DELTASONE) 10 MG tablet Take 10 mg by mouth daily with breakfast.    Historical Provider, MD  rosuvastatin (CRESTOR) 10 MG tablet Take 10 mg by mouth daily.    Historical Provider, MD  saxagliptin HCl (ONGLYZA) 5 MG TABS tablet Take 5 mg by mouth daily.    Historical Provider, MD  triamcinolone cream (KENALOG) 0.1 % Apply 1 application topically daily.  02/20/13   Historical Provider, MD  warfarin (COUMADIN) 5 MG tablet Take 1-2 tablets by mouth daily as directed 10/10/14   Phillips Hay, RPH-CPP   BP 124/72 mmHg  Pulse 77  Temp(Src) 99.3 F (37.4 C)  Resp 24  SpO2 93%   Physical Exam  Constitutional: He is oriented to person, place, and time. He appears well-developed and well-nourished. No distress.  HENT:  Head: Normocephalic and atraumatic.  Right Ear: External ear normal.  Left Ear: External ear normal.  Mouth/Throat: Oropharynx is clear and moist.  Neck: Normal range of motion.  Cardiovascular: Normal rate and regular rhythm.   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 No respiratory distress. He has no wheezes. He  has rales (in bilateral bases).  Abdominal: Soft. He exhibits no distension. There is no tenderness. There is no rebound and no guarding.  Neurological: He is alert and oriented to person, place, and time.  Skin: Skin is warm and dry. No rash noted. He is not diaphoretic.  Psychiatric: He has a normal mood and affect.  Vitals reviewed.   ED Course  Procedures  Labs Review  Results for orders placed or performed during the hospital encounter of 10/14/14  BNP (order ONLY if patient complains of dyspnea/SOB AND you have documented it for THIS visit)  Result Value Ref Range   Pro B Natriuretic peptide (BNP) 2372.0 (H) 0 - 450 pg/mL  CBC with Differential  Result Value Ref Range   WBC 12.7 (H) 4.0 - 10.5 K/uL   RBC 4.78 4.22 - 5.81 MIL/uL   Hemoglobin 15.3 13.0 - 17.0 g/dL   HCT 01.6 55.3 - 74.8 %   MCV 95.4 78.0 - 100.0 fL   MCH 32.0 26.0 - 34.0 pg   MCHC 33.6 30.0 - 36.0 g/dL   RDW 27.0 78.6 - 75.4 %   Platelets 145 (L) 150 - 400 K/uL   Neutrophils Relative % 83 (H) 43 - 77 %   Neutro Abs 10.6 (H) 1.7 - 7.7 K/uL   Lymphocytes Relative 7 (L) 12 - 46 %   Lymphs Abs 0.8 0.7 - 4.0 K/uL   Monocytes Relative 9 3 - 12 %   Monocytes Absolute 1.2 (H) 0.1 - 1.0 K/uL   Eosinophils Relative 1 0 - 5 %   Eosinophils Absolute 0.1 0.0 - 0.7 K/uL   Basophils Relative 0 0 - 1 %   Basophils Absolute 0.0 0.0 - 0.1 K/uL  Comprehensive metabolic panel  Result Value Ref Range   Sodium 137 137 - 147 mEq/L   Potassium 3.8 3.7 - 5.3 mEq/L   Chloride 96 96 - 112 mEq/L   CO2 25 19 - 32 mEq/L   Glucose, Bld 202 (H) 70 - 99 mg/dL   BUN 19 6 - 23 mg/dL   Creatinine, Ser 4.92 0.50 - 1.35 mg/dL   Calcium 9.8 8.4 - 01.0 mg/dL   Total Protein  6.6 6.0 - 8.3 g/dL   Albumin 3.3 (L) 3.5 - 5.2 g/dL   AST 22 0 - 37 U/L   ALT 46 0 - 53 U/L   Alkaline Phosphatase 85 39 - 117 U/L   Total Bilirubin 0.9 0.3 - 1.2 mg/dL   GFR calc non Af Amer 64 (L) >90 mL/min   GFR calc Af Amer 74 (L) >90 mL/min   Anion gap 16  (H) 5 - 15  Protime-INR (if pt is taking Coumadin)  Result Value Ref Range   Prothrombin Time 28.3 (H) 11.6 - 15.2 seconds   INR 2.63 (H) 0.00 - 1.49  Glucose, capillary  Result Value Ref Range   Glucose-Capillary 205 (H) 70 - 99 mg/dL  I-stat troponin, ED (not at [facility])  Result Value Ref Range   Troponin i, poc 0.05 0.00 - 0.08 ng/mL   Comment 3          I-Stat CG4 Lactic Acid, ED  Result Value Ref Range   Lactic Acid, Venous 2.49 (H) 0.5 - 2.2 mmol/L     Imaging Review Dg Chest Port 1 View  10/14/2014   CLINICAL DATA:  Chest pain.  EXAM: PORTABLE CHEST - 1 VIEW  COMPARISON:  Chest x-ray 05/14/2013  FINDINGS: Prior CABG. Cardiomegaly. Pulmonary vascularity is normal. Mild infiltrate medial right lung base. No pleural effusion. Stable left pleural thickening noted consistent scarring. Degenerative changes thoracic spine both shoulders.  IMPRESSION: 1. Mild infiltrate medial right lung base. 2. Cardiomegaly.  Prior CABG.  No pulmonary venous congestion.   Electronically Signed   By: Maisie Fus  Register   On: 10/14/2014 10:10   EKG A-fib with RVR, rate 120  MDM   Final diagnoses:  CAP (community acquired pneumonia)  Chest pain, unspecified chest pain type   73 y.o. male with a past medical history of CAD, A-fib. Presents due to chest pain that has been intermittent for the last week. Relieved partially with nitro. Also reports cough productive with white sputum that is new in the past 4 days.   Tachycardic (a-fib) - did not take his Cardizem this morning. Will give his PO Cardizem here. Given aspirin.   Concern for ACS, pneumonia. Has a very significant cardiac history. With new productive cough, concern for pnuemonia. Low suspicion for PE given no risk factors, therapeutic on coumadin.   CXR showed a mild infiltrate. Will treat for CAP. He will also need serial troponins and EKGs while admitted.   Hospitalist consulted for admission. He remained hemodynamically acceptable in the ED  and was admitted to a telemetry bed.   This case managed in conjunction with my attending, Dr. Jodi Mourning.     Maxine Glenn, MD 10/14/14 [phone]

## 2014-10-14 NOTE — Progress Notes (Signed)
Patient transferred to 2H22, report called to Vernona Rieger, RN.  Patient's rectal temperature 102.5, RR 28, BP 104/70 prior to transfer.  Dr. David Stall text paged and aware.

## 2014-10-14 NOTE — Progress Notes (Signed)
Patient states SOB worse. HR still maintaining 110s-140s. O2 sat 86% on RA, 2L applied.  Oxygen saturation 94% on 2L.  Dr. David Stall notified.  Orders for transfer to [facility] placed.  Will await bed placement and continue to monitor.

## 2014-10-14 NOTE — H&P (Addendum)
[facility] History and Physical  [name] TDH:[id number] DOB: 1941/04/19 DOA: 10/14/2014  Referring physician: Dr. Danae Chen PCP: Thayer Headings, MD   Chief Complaint: SOB  HPI: [name] is a 73 y.o. 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 He relates his shortness of breath has progressively gotten worse. He cannot ambulate to the bathroom without being short of breath. He cannot lay flat to sleep due to his back problems.  In the ED: Basic metabolic panel was done its within normal limits slightly as it was 2.4, he has a white count of 12 chest x-ray showed mild interstitial infiltrates on the right.   Review of Systems:  Constitutional:  No weight loss, night sweats, Fevers, chills, fatigue.  HEENT:  No headaches, Difficulty swallowing,Tooth/dental problems,Sore throat,  No sneezing, itching, ear ache, nasal congestion, post nasal drip,  Cardio-vascular:  No chest pain, Orthopnea, PND, swelling in lower extremities, anasarca, dizziness, palpitations  GI:  No heartburn, indigestion, abdominal pain, nausea, vomiting, diarrhea, change in bowel habits, loss of appetite  Resp:  No shortness of breath with exertion or at rest. No excess mucus, no productive cough, No non-productive cough, No coughing up of blood.No change in color of mucus.No wheezing.No chest wall deformity  Skin:  no rash or lesions.  GU:  no dysuria, change in color of urine, no urgency or frequency. No flank pain.  Musculoskeletal:  No joint pain or swelling. No decreased range of motion. No back pain.  Psych:  No change in mood or affect. No depression or anxiety. No memory loss.   Past Medical History  Diagnosis Date    . Pleuritis     h/o------ with negative vats biopsy  . Acute myocardial infarction, unspecified site, episode of care unspecified   . Hypertension   . Other and unspecified hyperlipidemia   . Coronary artery disease   . Chronic ischemic heart disease, unspecified   . Diabetes mellitus   . 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 Chronic anticoagulation, on coumadin 01/12/2013  . Cardiomyopathy, ischemic EF 25% 01/12/2013    now improved to 40-45% 01/2013  . Anginal pain   . Hypothyroidism   . Shortness of breath 01/12/2013    2D Echo - EF 45-50, moderate concentric hypertrophy of the left ventricle  . GERD (gastroesophageal reflux disease)   . Arthritis     all over  . LVF (left ventricular failure) 07/01/2012    2D Echo - EF 35-45, moderate concentric left ventricular hyperthrophy, LA severely dilated  . Atrial fibrillation 03/04/2011    Persistant; Persantine Cardiolite - inferolateral defect likely representing prior LCX infarct, study not gated  . Digitalis toxicity    Past Surgical History  Procedure Laterality Date  . Bypass graft    . Lung surgery    . Knee surgery    . Carpal tunnel release    . Cataract extraction    . Coronary artery bypass graft    . Cardiac catheterization Left 01/11/2013    Severe CAD with notable progression of disease in the SVG-RPDA followed by severe stenosis in grafted vessel, region not safely "reachede" for PCI, moderate ischemic CM no notable chage in  EF, continue medical therapy  . Cardiac catheterization  04/03/2011    Left circumflex 95% stenosis, essentially unchanged from last cath  . Cardiac catheterization  04/01/2010    LAD 99% proximal stenosis with vague flow, circumflex 80% stenosis, essentially unchanged form last cath  . Cardiac catheterization  03/28/2010    Mild pulmonary hypertension, medical therapy  . Cardiac catheterization  04/30/2009    LAD 95% stenosis, essentially unchanged fomr last  cath, medical therapy  . Cardiac catheterization  12/05/2008    2.5x12 apex, predilation in the distal PLA branch, 3x23 XIENCE V durg eluting stent and post dilated with a 3.25x20 [place] VOYAGER at 15 atmospheres   Social History:  reports that he has never smoked. He has never used smokeless tobacco. He reports that he does not drink alcohol or use illicit drugs.  Allergies  Allergen Reactions  . Clarithromycin Other (See Comments)    Unknown, thinks "blisters in mouth"  . Influenza Vaccines Other (See Comments)    Severe coughing for weeks after vaccine; also had flu like symptoms for about 7 to 8 weeks.   . Lanoxin [Digoxin] Other (See Comments)    Nausea, anorexia  . Lisinopril Other (See Comments)    cough  . Ranexa [Ranolazine Er]   . Statins     Family History  Problem Relation Age of Onset  . Heart disease Mother   . Heart disease Father   . Heart disease Brother   . Heart disease Maternal Aunt   . Cancer       Prior to Admission medications   Medication Sig Start Date End Date Taking? Authorizing Provider  acetaminophen (TYLENOL) 650 MG CR tablet Take 650 mg by mouth every 8 (eight) hours as needed. For pain   Yes Historical Provider, MD  aspirin EC 81 MG tablet Take 81 mg by mouth daily.   Yes Historical Provider, MD  calcium carbonate (OS-CAL) 600 MG TABS Take 600 mg by mouth daily.   Yes Historical Provider, MD  diltiazem (DILTIAZEM CD) 120 MG 24 hr capsule Take 120 mg by mouth daily. Take in the morning   Yes Historical Provider, MD  diltiazem (DILTIAZEM CD) 240 MG 24 hr capsule Take 240 mg by mouth every evening.   Yes Historical Provider, MD  fluticasone (FLONASE) 50 MCG/ACT nasal spray Place 2 sprays into both nostrils daily.    Yes Historical Provider, MD  furosemide (LASIX) 40 MG tablet Take 40 mg by mouth daily. MAY TAKE AN EXTRA LASIX 40MG  IF WEIGHT INCREASES BY 3-5 LBS   Yes Historical Provider, MD  levothyroxine (SYNTHROID, LEVOTHROID) 150 MCG tablet Take 175  mcg by mouth daily.    Yes Historical Provider, MD  metoprolol tartrate (LOPRESSOR) 25 MG tablet Take, 1.5 tablets in the morning and 1 tablet in the evening Patient taking differently: Take 25-37.5 mg by mouth 2 (two) times daily. Take, 1.5 tablets in the morning and 1 tablet in the evening 05/08/14  Yes 05/10/14, MD  nitroGLYCERIN (NITROSTAT) 0.4 MG SL tablet Place 1 tablet (0.4 mg total) under the tongue every 5 (five) minutes as needed for chest pain. 11/30/13  Yes 12/02/13, MD  nystatin cream (MYCOSTATIN) Apply as needed 12/12/13  Yes Historical Provider, MD  Omega-3 Fatty Acids (FISH OIL) 1000 MG CAPS Take 3,000 mg by mouth every morning.   Yes Historical Provider, MD  omeprazole (PRILOSEC) 20 MG capsule Take 20 mg by mouth daily.   Yes Historical Provider, MD  predniSONE (DELTASONE) 10 MG tablet Take 10 mg by mouth daily with breakfast.   Yes Historical Provider, MD  rosuvastatin (CRESTOR) 10 MG tablet Take 10 mg by mouth daily.   Yes Historical Provider, MD  saxagliptin HCl (ONGLYZA) 5 MG TABS tablet Take 5 mg by mouth daily.   Yes Historical Provider, MD  triamcinolone cream (KENALOG) 0.1 % Apply 1 application topically daily.  02/20/13  Yes Historical Provider, MD  warfarin (COUMADIN) 5 MG tablet Take 1-2 tablets by mouth daily as directed Patient taking differently: Take 7.5-10 mg by mouth daily at 6 PM. 7.5 mg daily except 10mg  Monday and Friday 10/10/14  Yes Phillips Hay, RPH-CPP  aspirin 81 MG tablet Take 81 mg by mouth daily.    Historical Provider, MD  buprenorphine (BUTRANS) 5 MCG/HR PTWK patch Place 5 mcg onto the skin once a week.    Historical Provider, MD  diltiazem (CARDIZEM CD) 120 MG 24 hr capsule TAKE 1 CAPSULE (120 MG TOTAL) BY MOUTH DAILY.    Runell Gess, MD  diltiazem (CARDIZEM CD) 120 MG 24 hr capsule TAKE 1 CAPSULE (120 MG TOTAL) BY MOUTH DAILY. Patient not taking: Reported on 10/14/2014 10/03/14   Leone Brand, NP  diltiazem (CARDIZEM CD) 240 MG  24 hr capsule TAKE 1 CAPSULE (240 MG TOTAL) BY MOUTH DAILY. Patient not taking: Reported on 10/14/2014    Runell Gess, MD  furosemide (LASIX) 40 MG tablet TAKE 1 TABLET BY MOUTH DAILY. MAY TAKE AN EXTRA LASIX 40MG  IF WEIGHT INCREASES BY 3-5 LBS Patient not taking: Reported on 10/14/2014 10/01/14   Runell Gess, MD  hydrocortisone (PROCTOZONE-HC) 2.5 % rectal cream Place rectally 2 (two) times daily. 04/28/13   Romie Levee, MD  isosorbide mononitrate (IMDUR) 30 MG 24 hr tablet Take 1 tablet (30 mg total) by mouth daily at 3 pm. 07/19/14   Runell Gess, MD  LANTUS SOLOSTAR 100 UNIT/ML Solostar Pen Inject 32 Units into the skin daily.  02/01/14   Historical Provider, MD   Physical Exam: Filed Vitals:   10/14/14 1000 10/14/14 1022 10/14/14 1023 10/14/14 1115  BP: 115/51 138/75  125/98  Pulse: 69   82  Temp:   98.6 F (37 C)   Resp: 29   23  SpO2: 94%   95%    Wt Readings from Last 3 Encounters:  08/15/14 99.474 kg (219 lb 4.8 oz)  06/01/14 98.884 kg (218 lb)  02/23/14 99.655 kg (219 lb 11.2 oz)    General:  Appears calm and comfortable Eyes: PERRL, normal lids, irises & conjunctiva ENT: grossly normal hearing, lips & tongue, positive hepatojugular reflux. Neck: no LAD, masses or thyromegaly Cardiovascular: RRR, no m/r/g. No LE edema. Telemetry: SR, no arrhythmias  Respiratory: good air movement with crackles on the left appreciated heart sounds under right lower lobe Abdomen: soft, ntnd Skin: no rash or induration seen on limited exam Musculoskeletal: grossly normal tone BUE/BLE Psychiatric: grossly normal mood and affect, speech fluent and appropriate Neurologic: grossly non-focal.          Labs on Admission:  Basic Metabolic Panel:  Recent Labs Lab 10/14/14 0944  NA 137  K 3.8  CL 96  CO2 25  GLUCOSE 202*  BUN 19  CREATININE 1.10  CALCIUM 9.8   Liver Function Tests:  Recent Labs Lab 10/14/14 0944  AST 22  ALT 46  ALKPHOS 85  BILITOT 0.9  PROT 6.6   ALBUMIN 3.3*   No results for input(s): LIPASE,  AMYLASE in the last 168 hours. No results for input(s): AMMONIA in the last 168 hours. CBC:  Recent Labs Lab 10/14/14 0944  WBC 12.7*  NEUTROABS 10.6*  HGB 15.3  HCT 45.6  MCV 95.4  PLT 145*   Cardiac Enzymes: No results for input(s): CKTOTAL, CKMB, CKMBINDEX, TROPONINI in the last 168 hours.  BNP (last 3 results)  Recent Labs  10/14/14 0921  PROBNP 2372.0*   CBG: No results for input(s): GLUCAP in the last 168 hours.  Radiological Exams on Admission: Dg Chest Port 1 View  10/14/2014   CLINICAL DATA:  Chest pain.  EXAM: PORTABLE CHEST - 1 VIEW  COMPARISON:  Chest x-ray 05/14/2013  FINDINGS: Prior CABG. Cardiomegaly. Pulmonary vascularity is normal. Mild infiltrate medial right lung base. No pleural effusion. Stable left pleural thickening noted consistent scarring. Degenerative changes thoracic spine both shoulders.  IMPRESSION: 1. Mild infiltrate medial right lung base. 2. Cardiomegaly.  Prior CABG.  No pulmonary venous congestion.   Electronically Signed   By: Maisie Fus  Register   On: 10/14/2014 10:10    EKG: Independently reviewed. none  Assessment/Plan  Acute respiratory failure with hypoxia  - Unclear etiology ?  If CAP (community acquired pneumonia) and probable systolic heart failure Cardiomyopathy, ischemic EF 25% at one time most recent cath EF 40-45% 01/2013: - Confusing picture he's been having cough and a mild temperature at home he did not check , he also has crackles on the left side, his BNP is elevated mildly which could be pointing towards heart failure componen, but it is lower than on previous admissions for heart failure. I will repeated in am. I will go ahead and start him on Rocephin and doxycycline continue diltiazem and and will reevaluate in the morning. Start IV lasix. We'll check a basic metabolic panel in the morning.  - he denies any chest pain,proceeded of cardiac enzymes is negative. His like to  gaseous mildly elevated at 2.9. - last year when he was in the hospital in 2014 his weight was 84 kg, today is 99 kg. The family cannot tell me if his weight has increased on his scale at home.   Essential hypertension: - continue current home meds.  Diabetes mellitus type 2: - Continue his home regimen and add sliding scale insulin. Check hemoglobin A1c.   Code Status: full (must indicate code status--if unknown or must be presumed, indicate so) DVT Prophylaxis: Family Communication: none (indicate person spoken with, if applicable, with phone number if by telephone) Disposition Plan: inpatient  Time spent: 85 minutes  FELIZ Rosine Beat [facility] Pager [phone]

## 2014-10-14 NOTE — Progress Notes (Signed)
Patient arrived on unit from ED, vital signs stable.  Patient experiencing chest pain in left medial and upper chest, rating 5/10 initially.  Dr. David Stall notified, EKG performed and in chart, nitro administered x2.  Vital signs remain stable.  Patient rates pain 4/10 after nitro, states this is manageable.  Dr. David Stall text paged.  Will continue to monitor.

## 2014-10-14 NOTE — ED Notes (Signed)
Attempted report 

## 2014-10-14 NOTE — ED Notes (Signed)
Pt refused rectal temp.

## 2014-10-14 NOTE — ED Notes (Signed)
Per pt sts chest pain over 1 week and taking nitro without relief. sts also that he has had cough. Pt low grade fever at triage and some SOB.

## 2014-10-14 NOTE — ED Notes (Signed)
NOTIFIED DR. BATISTA IN PERSON FOR PATIENTS PANIC LAB RESULTS OF CG4+ LACTIC ACID =2.49 mmol/L ,@10 :25 AM ,10/14/2014.

## 2014-10-15 ENCOUNTER — Encounter (HOSPITAL_COMMUNITY): Payer: Self-pay | Admitting: Physician Assistant

## 2014-10-15 ENCOUNTER — Inpatient Hospital Stay (HOSPITAL_COMMUNITY): Payer: Medicare Other

## 2014-10-15 DIAGNOSIS — I214 Non-ST elevation (NSTEMI) myocardial infarction: Principal | ICD-10-CM

## 2014-10-15 DIAGNOSIS — I4891 Unspecified atrial fibrillation: Secondary | ICD-10-CM | POA: Diagnosis present

## 2014-10-15 DIAGNOSIS — J811 Chronic pulmonary edema: Secondary | ICD-10-CM | POA: Diagnosis not present

## 2014-10-15 DIAGNOSIS — I059 Rheumatic mitral valve disease, unspecified: Secondary | ICD-10-CM

## 2014-10-15 DIAGNOSIS — J986 Disorders of diaphragm: Secondary | ICD-10-CM | POA: Diagnosis present

## 2014-10-15 DIAGNOSIS — N179 Acute kidney failure, unspecified: Secondary | ICD-10-CM

## 2014-10-15 LAB — CBC
HEMATOCRIT: 43.5 % (ref 39.0–52.0)
Hemoglobin: 14.4 g/dL (ref 13.0–17.0)
MCH: 31.9 pg (ref 26.0–34.0)
MCHC: 33.1 g/dL (ref 30.0–36.0)
MCV: 96.2 fL (ref 78.0–100.0)
Platelets: 170 10*3/uL (ref 150–400)
RBC: 4.52 MIL/uL (ref 4.22–5.81)
RDW: 15.7 % — ABNORMAL HIGH (ref 11.5–15.5)
WBC: 12.1 10*3/uL — ABNORMAL HIGH (ref 4.0–10.5)

## 2014-10-15 LAB — GLUCOSE, CAPILLARY
Glucose-Capillary: 239 mg/dL — ABNORMAL HIGH (ref 70–99)
Glucose-Capillary: 228 mg/dL — ABNORMAL HIGH (ref 70–99)
Glucose-Capillary: 319 mg/dL — ABNORMAL HIGH (ref 70–99)
Glucose-Capillary: 282 mg/dL — ABNORMAL HIGH (ref 70–99)

## 2014-10-15 LAB — BASIC METABOLIC PANEL
Anion gap: 15 (ref 5–15)
BUN: 22 mg/dL (ref 6–23)
CO2: 24 meq/L (ref 19–32)
CREATININE: 1.47 mg/dL — AB (ref 0.50–1.35)
Calcium: 8.9 mg/dL (ref 8.4–10.5)
Chloride: 91 mEq/L — ABNORMAL LOW (ref 96–112)
GFR calc Af Amer: 52 mL/min — ABNORMAL LOW (ref >90)
GFR calc non Af Amer: 45 mL/min — ABNORMAL LOW (ref >90)
GLUCOSE: 291 mg/dL — AB (ref 70–99)
Potassium: 4.1 mEq/L (ref 3.7–5.3)
Sodium: 130 mEq/L — ABNORMAL LOW (ref 137–147)

## 2014-10-15 LAB — INFLUENZA PANEL BY PCR (TYPE A & B)
H1N1 flu by pcr: NOT DETECTED
INFLAPCR: NEGATIVE
Influenza B By PCR: NEGATIVE

## 2014-10-15 LAB — PRO B NATRIURETIC PEPTIDE: Pro B Natriuretic peptide (BNP): 9181 pg/mL — ABNORMAL HIGH (ref 0–450)

## 2014-10-15 LAB — HEMOGLOBIN A1C
HEMOGLOBIN A1C: 8.6 % — AB (ref <5.7)
MEAN PLASMA GLUCOSE: 200 mg/dL — AB (ref <117)

## 2014-10-15 LAB — TROPONIN I
Troponin I: 4.91 ng/mL (ref <0.30)
Troponin I: 4.44 ng/mL (ref <0.30)

## 2014-10-15 LAB — VANCOMYCIN, TROUGH: VANCOMYCIN TR: 14.4 ug/mL (ref 10.0–20.0)

## 2014-10-15 LAB — PROTIME-INR
INR: 2.09 — ABNORMAL HIGH (ref 0.00–1.49)
PROTHROMBIN TIME: 23.7 s — AB (ref 11.6–15.2)

## 2014-10-15 MED ORDER — HEPARIN (PORCINE) IN NACL 100-0.45 UNIT/ML-% IJ SOLN
1300.0000 [IU]/h | INTRAMUSCULAR | Status: DC
  Administered 2014-10-16: 1300 [IU]/h via INTRAVENOUS
  Filled 2014-10-15 (×2): qty 250

## 2014-10-15 MED ORDER — INSULIN ASPART 100 UNIT/ML ~~LOC~~ SOLN
0.0000 [IU] | Freq: Every day | SUBCUTANEOUS | Status: DC
  Administered 2014-10-15: 3 [IU] via SUBCUTANEOUS
  Administered 2014-10-16 – 2014-10-19 (×2): 2 [IU] via SUBCUTANEOUS

## 2014-10-15 MED ORDER — METOPROLOL SUCCINATE ER 25 MG PO TB24
25.0000 mg | ORAL_TABLET | Freq: Two times a day (BID) | ORAL | Status: DC
  Administered 2014-10-15: 25 mg via ORAL
  Filled 2014-10-15 (×3): qty 1

## 2014-10-15 MED ORDER — VANCOMYCIN HCL IN DEXTROSE 750-5 MG/150ML-% IV SOLN
750.0000 mg | Freq: Two times a day (BID) | INTRAVENOUS | Status: DC
  Administered 2014-10-15 – 2014-10-17 (×3): 750 mg via INTRAVENOUS
  Filled 2014-10-15 (×6): qty 150

## 2014-10-15 MED ORDER — METOPROLOL TARTRATE 1 MG/ML IV SOLN
2.5000 mg | INTRAVENOUS | Status: DC | PRN
  Administered 2014-10-15 – 2014-10-18 (×9): 2.5 mg via INTRAVENOUS
  Filled 2014-10-15 (×9): qty 5

## 2014-10-15 MED ORDER — ROSUVASTATIN CALCIUM 20 MG PO TABS
20.0000 mg | ORAL_TABLET | Freq: Every day | ORAL | Status: DC
  Administered 2014-10-17 – 2014-10-23 (×7): 20 mg via ORAL
  Filled 2014-10-15 (×8): qty 1

## 2014-10-15 MED ORDER — FUROSEMIDE 10 MG/ML IJ SOLN
80.0000 mg | Freq: Three times a day (TID) | INTRAMUSCULAR | Status: DC
  Administered 2014-10-15 (×2): 80 mg via INTRAVENOUS
  Filled 2014-10-15 (×5): qty 8

## 2014-10-15 MED ORDER — METOPROLOL TARTRATE 1 MG/ML IV SOLN: 2.5000 mg | INTRAVENOUS | Status: DC | PRN

## 2014-10-15 MED ORDER — FUROSEMIDE 10 MG/ML IJ SOLN
60.0000 mg | Freq: Two times a day (BID) | INTRAMUSCULAR | Status: DC
  Administered 2014-10-15: 60 mg via INTRAVENOUS
  Filled 2014-10-15: qty 6

## 2014-10-15 MED ORDER — INSULIN ASPART 100 UNIT/ML ~~LOC~~ SOLN
0.0000 [IU] | Freq: Three times a day (TID) | SUBCUTANEOUS | Status: DC
  Administered 2014-10-15: 15 [IU] via SUBCUTANEOUS
  Administered 2014-10-15 – 2014-10-16 (×2): 7 [IU] via SUBCUTANEOUS
  Administered 2014-10-17: 20 [IU] via SUBCUTANEOUS
  Administered 2014-10-17: 11 [IU] via SUBCUTANEOUS
  Administered 2014-10-17: 4 [IU] via SUBCUTANEOUS
  Administered 2014-10-18: 11 [IU] via SUBCUTANEOUS
  Administered 2014-10-18: 4 [IU] via SUBCUTANEOUS
  Administered 2014-10-18: 11 [IU] via SUBCUTANEOUS
  Administered 2014-10-19: 7 [IU] via SUBCUTANEOUS
  Administered 2014-10-19: 4 [IU] via SUBCUTANEOUS
  Administered 2014-10-19: 3 [IU] via SUBCUTANEOUS
  Administered 2014-10-20: 4 [IU] via SUBCUTANEOUS
  Administered 2014-10-20 – 2014-10-21 (×3): 7 [IU] via SUBCUTANEOUS
  Administered 2014-10-22: 11 [IU] via SUBCUTANEOUS
  Administered 2014-10-22: 3 [IU] via SUBCUTANEOUS
  Administered 2014-10-23: 4 [IU] via SUBCUTANEOUS

## 2014-10-15 MED ORDER — NITROGLYCERIN 2 % TD OINT
0.5000 [in_us] | TOPICAL_OINTMENT | Freq: Four times a day (QID) | TRANSDERMAL | Status: DC
  Administered 2014-10-15 – 2014-10-16 (×3): 0.5 [in_us] via TOPICAL
  Filled 2014-10-15: qty 30

## 2014-10-15 NOTE — Progress Notes (Signed)
ANTICOAGULATION CONSULT NOTE - Initial Consult  Pharmacy Consult for heparin Indication: atrial fibrillation  Allergies  Allergen Reactions  . Butrans [Buprenorphine] Shortness Of Breath  . Clarithromycin Other (See Comments)    Unknown, thinks "blisters in mouth"  . Influenza Vaccines Other (See Comments)    Severe coughing for weeks after vaccine; also had flu like symptoms for about 7 to 8 weeks.   . Lanoxin [Digoxin] Other (See Comments)    Nausea, anorexia  . Lisinopril Other (See Comments)    cough  . Ranexa [Ranolazine Er]   . Statins     Patient Measurements: Height: 6' (182.9 cm) Weight: 217 lb 2.5 oz (98.5 kg) IBW/kg (Calculated) : 77.6 Heparin Dosing Weight: 97kg  Vital Signs: Temp: 98.2 F (36.8 C) (11/02 2013) Temp Source: Oral (11/02 2013) BP: 118/65 mmHg (11/02 2000) Pulse Rate: 108 (11/02 2000)  Labs:  Recent Labs  10/14/14 0944 10/14/14 1340 10/15/14 0237 10/15/14 1332 10/15/14 1947  HGB 15.3 14.6 14.4  --   --   HCT 45.6 43.3 43.5  --   --   PLT 145* 143* 170  --   --   LABPROT 28.3*  --   --   --  23.7*  INR 2.63*  --   --   --  2.09*  CREATININE 1.10 1.15 1.47*  --   --   TROPONINI  --   --   --  4.91* 4.44*    Estimated Creatinine Clearance: 52.8 mL/min (by C-G formula based on Cr of 1.47).   Medical History: Past Medical History  Diagnosis Date  . Pleuritis     h/o------ with negative vats biopsy  . Acute myocardial infarction, unspecified site, episode of care unspecified   . Hypertension   . Other and unspecified hyperlipidemia   . Coronary artery disease   . Chronic ischemic heart disease, unspecified   . Diabetes mellitus   . 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 Chronic anticoagulation, on coumadin 01/12/2013  . Cardiomyopathy, ischemic EF 25% 01/12/2013    now improved to 40-45% 01/2013  . Hypothyroidism   . GERD (gastroesophageal reflux disease)   . Arthritis     all over  .  Atrial fibrillation 03/04/2011    Persistant;   . Digitalis toxicity      Assessment: 73 YOM admitted with SOB and started on antibiotics for PNA. Also with hx CAD- troponins intially normal, but now elevated at 4.91. Patient with AFib on chronic warfarin (home dose 7.5mg  daily except 10mg  on Mondays and Fridays)- INR was 2.63 yesterday- patient did NOT receive warfarin last evening and a INR was rechecked this evening and was 2.09. To start heparin when INR <2 in anticipation of cath. hgb 14.4, plts 170- no bleeding noted.  Goal of Therapy:  Heparin level 0.3-0.7 units/ml Monitor platelets by anticoagulation protocol: Yes   Plan:  1. Start heparin with NO BOLUS at 1300 units/hr TOMORROW MORNING AT 0800- anticipate INR will be <2 at this point 2. Heparin level 8 hours after starting 3. Daily heparin level and CBC 4. Follow for cath plans  Amdrew Oboyle D. Gerrad Welker, PharmD, BCPS Clinical Pharmacist Pager: [phone] 10/15/2014 9:06 PM

## 2014-10-15 NOTE — Progress Notes (Signed)
Utilization review complete. Brinnley Lacap RN CCM Case Mgmt phone [phone] 

## 2014-10-15 NOTE — Consult Note (Signed)
ANTIBIOTIC CONSULT NOTE  Pharmacy Consult for Vancomycin Indication: pneumonia  Allergies  Allergen Reactions  . Butrans [Buprenorphine] Shortness Of Breath  . Clarithromycin Other (See Comments)    Unknown, thinks "blisters in mouth"  . Influenza Vaccines Other (See Comments)    Severe coughing for weeks after vaccine; also had flu like symptoms for about 7 to 8 weeks.   . Lanoxin [Digoxin] Other (See Comments)    Nausea, anorexia  . Lisinopril Other (See Comments)    cough  . Ranexa [Ranolazine Er]   . Statins     Patient Measurements: Height: 6' (182.9 cm) Weight: 217 lb 2.5 oz (98.5 kg) IBW/kg (Calculated) : 77.6  Vital Signs: Temp: 98.2 F (36.8 C) (11/02 2013) Temp Source: Oral (11/02 2013) BP: 118/65 mmHg (11/02 2000) Pulse Rate: 108 (11/02 2000) Intake/Output from previous day: 11/01 0701 - 11/02 0700 In: 1726 [P.O.:480; I.V.:346; IV Piggyback:900] Out: 775 [Urine:775] Intake/Output from this shift: Total I/O In: 10 [I.V.:10] Out: 300 [Urine:300]  Labs:  Recent Labs  10/14/14 0944 10/14/14 1340 10/15/14 0237  WBC 12.7* 10.6* 12.1*  HGB 15.3 14.6 14.4  PLT 145* 143* 170  CREATININE 1.10 1.15 1.47*   Estimated Creatinine Clearance: 52.8 mL/min (by C-G formula based on Cr of 1.47).  Microbiology: Recent Results (from the past 720 hour(s))  MRSA PCR Screening     Status: None   Collection Time: 10/14/14  6:37 PM  Result Value Ref Range Status   MRSA by PCR NEGATIVE NEGATIVE Final    Comment:        The GeneXpert MRSA Assay (FDA approved for NASAL specimens only), is one component of a comprehensive MRSA colonization surveillance program. It is not intended to diagnose MRSA infection nor to guide or monitor treatment for MRSA infections.    Assessment: 73yom admitted with SOB and originally started on ceftriaxone and doxycycline, but transitioned to vancomycin and cefepime d/t worsening respiratory failure. SCr increased from 1.15 to 1.47,  and a random vancomycin level was checked this evening which resulted around therapeutic range at 14.67mcg/mL.  Goal of Therapy:  Vancomycin trough level 15-20 mcg/ml  Plan:  1) resume Vancomycin with 750mg  IV q12 2) cefepime 1g IV q8 3) Follow renal function, cultures, LOT, level at new SS  [name], PharmD, BCPS Clinical Pharmacist Pager: [phone] 10/15/2014 9:00 PM

## 2014-10-15 NOTE — Consult Note (Signed)
CARDIOLOGY CONSULT NOTE   Patient ID: [name] MRN: [id number] DOB/AGE: [birth year] 73 y.o.  Admit date: 10/14/2014  Primary Physician   Thayer Headings, MD Primary Cardiologist   Dr. [name]  Reason for Consultation  Chest pain   HPI: [name] is a 73 y.o. 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  He was last cathed in 01/2013 which revealed native CAD with notable progression of disease in the SVG-RPDA followed by a focal severe stenosis in the grafted vessel. This lesion is likely not safely "reached" for PCI, due to the occlusion of the native PDA requiring advancing PTCA equipment through the grossly diseased SVG. Besides the PDA lesion and SVG-RCA disease, there was no notable change in the remaining angiographic imaging. He was continued on medical therapy at that time.   Yesterday, he came in complaining of shortness of breath ongoing for 4 days prior to admission and chest pain that was not relieved by SL NTG. He reported coughing without fever or other constitutional signs. This shortness of breath is different from his baseline which is caused by an issue with a paralyzed left hemidiaphragm; he is followed by Dr. Shelle Iron as an outpatient for this. Patient also has chronic exertional chest pain and only occasional resting chest pain. He also has chronic back pain due to spinal stenosis and is unable to lay flat in the bed.  Evaluation in the ER revealed mild leukocytosis with a white count of 12,000, CXR demonstrated mild interstitial infiltrates in the right base. Troponin negative initially.  It was felt his presenting symptoms were likely related to CAP and he was started on empiric antibiotic therapy. There was some concern he may have mild heart failure exacerbation related to the acute  illness noting his proBNP was 2K  After admission patient developed atrial fibrillation with rapid ventricular response and was subsequently started on IV Cardizem drip. He later developed a MAXIMUM TEMPERATURE of 102.5 rectally.patient had been started on Lasix but after the development of fever this was discontinued since the admitting physician was now favoring an infectious process as etiology to patient's respiratory symptoms. Subsequently his antibiotics were broadened  By 11/2 patient continued to require high levels of oxygen at 6 L/m. His ventricular rate was better controlled in the 70s and the Cardizem was weaned and discontinued. Blood pressures remain soft in the 90 systolic range. His creatinine had increased. He was noted to have focal lower extremity edema around 2+ on the left lower extremity and 1+ on the right lower extremity.he now had bilateral crackles on exam. Follow-up chest x-ray consistent with pulmonary edema so Lasix was started and given the fever influenza PCR was checked.    Past Medical History  Diagnosis Date  . Pleuritis     h/o------ with negative vats biopsy  . Acute myocardial infarction, unspecified site, episode of care unspecified   . Hypertension   . Other and unspecified hyperlipidemia   . Coronary artery disease   . Chronic ischemic heart disease, unspecified   . Diabetes mellitus   . 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 Chronic anticoagulation, on coumadin 01/12/2013  . Cardiomyopathy, ischemic EF 25% 01/12/2013    now improved to 40-45% 01/2013  . Anginal pain   .  Hypothyroidism   . Shortness of breath 01/12/2013    2D Echo - EF 45-50, moderate concentric hypertrophy of the left ventricle  . GERD (gastroesophageal reflux disease)   . Arthritis     all over  . LVF (left ventricular failure) 07/01/2012    2D Echo - EF 35-45, moderate concentric left ventricular hyperthrophy, LA severely dilated  . Atrial  fibrillation 03/04/2011    Persistant; Persantine Cardiolite - inferolateral defect likely representing prior LCX infarct, study not gated  . Digitalis toxicity      Past Surgical History  Procedure Laterality Date  . Bypass graft    . Lung surgery    . Knee surgery    . Carpal tunnel release    . Cataract extraction    . Coronary artery bypass graft    . Cardiac catheterization Left 01/11/2013    Severe CAD with notable progression of disease in the SVG-RPDA followed by severe stenosis in grafted vessel, region not safely "reachede" for PCI, moderate ischemic CM no notable chage in EF, continue medical therapy  . Cardiac catheterization  04/03/2011    Left circumflex 95% stenosis, essentially unchanged from last cath  . Cardiac catheterization  04/01/2010    LAD 99% proximal stenosis with vague flow, circumflex 80% stenosis, essentially unchanged form last cath  . Cardiac catheterization  03/28/2010    Mild pulmonary hypertension, medical therapy  . Cardiac catheterization  04/30/2009    LAD 95% stenosis, essentially unchanged fomr last cath, medical therapy  . Cardiac catheterization  12/05/2008    2.5x12 apex, predilation in the distal PLA branch, 3x23 XIENCE V durg eluting stent and post dilated with a 3.25x20 [place] VOYAGER at 15 atmospheres    Allergies  Allergen Reactions  . Butrans [Buprenorphine] Shortness Of Breath  . Clarithromycin Other (See Comments)    Unknown, thinks "blisters in mouth"  . Influenza Vaccines Other (See Comments)    Severe coughing for weeks after vaccine; also had flu like symptoms for about 7 to 8 weeks.   . Lanoxin [Digoxin] Other (See Comments)    Nausea, anorexia  . Lisinopril Other (See Comments)    cough  . Ranexa [Ranolazine Er]   . Statins     I have reviewed the patient's current medications . aspirin EC  81 mg Oral Daily  . ceFEPime (MAXIPIME) IV  1 g Intravenous 3 times per day  . fluticasone  2 spray Each Nare Daily  . furosemide  60 mg  Intravenous Q12H  . insulin aspart  0-20 Units Subcutaneous TID WC  . insulin aspart  0-5 Units Subcutaneous QHS  . insulin aspart  3 Units Subcutaneous TID WC  . insulin glargine  32 Units Subcutaneous Daily  . levothyroxine  175 mcg Oral Daily  . linagliptin  5 mg Oral Daily  . metoprolol tartrate  25 mg Oral QHS  . metoprolol tartrate  37.5 mg Oral Daily  . nitroGLYCERIN  0.5 inch Topical 4 times per day  . pantoprazole  40 mg Oral BID  . predniSONE  10 mg Oral Q breakfast  . rosuvastatin  10 mg Oral Daily  . sodium chloride  3 mL Intravenous Q12H  . sodium chloride  3 mL Intravenous Q12H     sodium chloride, acetaminophen **OR** acetaminophen, HYDROcodone-acetaminophen, nitroGLYCERIN, ondansetron **OR** ondansetron (ZOFRAN) IV, polyethylene glycol, sodium chloride  Prior to Admission medications   Medication Sig Start Date End Date Taking? Authorizing Provider  acetaminophen (TYLENOL) 650 MG CR tablet Take 650  mg by mouth every 8 (eight) hours as needed. For pain   Yes Historical Provider, MD  aspirin EC 81 MG tablet Take 81 mg by mouth daily.   Yes Historical Provider, MD  calcium carbonate (OS-CAL) 600 MG TABS Take 600 mg by mouth daily.   Yes Historical Provider, MD  diltiazem (DILTIAZEM CD) 120 MG 24 hr capsule Take 120 mg by mouth daily. Take in the morning   Yes Historical Provider, MD  diltiazem (DILTIAZEM CD) 240 MG 24 hr capsule Take 240 mg by mouth every evening.   Yes Historical Provider, MD  fluticasone (FLONASE) 50 MCG/ACT nasal spray Place 2 sprays into both nostrils daily.    Yes Historical Provider, MD  furosemide (LASIX) 40 MG tablet Take 40 mg by mouth daily. MAY TAKE AN EXTRA LASIX 40MG  IF WEIGHT INCREASES BY 3-5 LBS   Yes Historical Provider, MD  isosorbide mononitrate (IMDUR) 30 MG 24 hr tablet Take 1 tablet (30 mg total) by mouth daily at 3 pm. 07/19/14  Yes Runell Gess, MD  LANTUS SOLOSTAR 100 UNIT/ML Solostar Pen Inject 32 Units into the skin daily.   02/01/14  Yes Historical Provider, MD  levothyroxine (SYNTHROID, LEVOTHROID) 150 MCG tablet Take 175 mcg by mouth daily.    Yes Historical Provider, MD  metoprolol tartrate (LOPRESSOR) 25 MG tablet Take, 1.5 tablets in the morning and 1 tablet in the evening Patient taking differently: Take 25-37.5 mg by mouth 2 (two) times daily. Take, 1.5 tablets in the morning and 1 tablet in the evening 05/08/14  Yes Runell Gess, MD  nitroGLYCERIN (NITROSTAT) 0.4 MG SL tablet Place 1 tablet (0.4 mg total) under the tongue every 5 (five) minutes as needed for chest pain. 11/30/13  Yes Runell Gess, MD  nystatin cream (MYCOSTATIN) Apply as needed 12/12/13  Yes Historical Provider, MD  Omega-3 Fatty Acids (FISH OIL) 1000 MG CAPS Take 3,000 mg by mouth every morning.   Yes Historical Provider, MD  omeprazole (PRILOSEC) 20 MG capsule Take 20 mg by mouth daily.   Yes Historical Provider, MD  predniSONE (DELTASONE) 10 MG tablet Take 10 mg by mouth daily with breakfast.   Yes Historical Provider, MD  rosuvastatin (CRESTOR) 10 MG tablet Take 10 mg by mouth daily.   Yes Historical Provider, MD  saxagliptin HCl (ONGLYZA) 5 MG TABS tablet Take 5 mg by mouth daily.   Yes Historical Provider, MD  triamcinolone cream (KENALOG) 0.1 % Apply 1 application topically daily.  02/20/13  Yes Historical Provider, MD  warfarin (COUMADIN) 5 MG tablet Take 1-2 tablets by mouth daily as directed Patient taking differently: Take 7.5-10 mg by mouth daily at 6 PM. 7.5 mg daily except 10mg  Monday and Friday 10/10/14  Yes Phillips Hay, RPH-CPP     History   Social History  . Marital Status: Married    Spouse Name: N/A    Number of Children: N/A  . Years of Education: N/A   Occupational History  . Not on file.   Social History Main Topics  . Smoking status: Never Smoker   . Smokeless tobacco: Never Used  . Alcohol Use: No  . Drug Use: No  . Sexual Activity: Not on file   Other Topics Concern  . Not on file   Social  History Narrative    No family status information on file.   Family History  Problem Relation Age of Onset  . Heart disease Mother   . Heart disease Father   .  Heart disease Brother   . Heart disease Maternal Aunt   . Cancer       ROS:  Full 14 point review of systems complete and found to be negative unless listed above.  Physical Exam: Blood pressure 105/60, pulse 70, temperature 98.2 F (36.8 C), temperature source Oral, resp. rate 30, height 6' (1.829 m), weight 217 lb 2.5 oz (98.5 kg), SpO2 86 %.  General: Well developed, well nourished, male in no acute distress Head: Eyes PERRLA, No xanthomas.   Normocephalic and atraumatic, oropharynx without edema or exudate. Lungs: decreased lung sounds at bases. Appears tachypnic. Heart: HRRR S1 S2, no rub/gallop, Heart irregular rate and rhythm with S1, S2  murmur. pulses are 2+ extrem.   Neck: No carotid bruits. No lymphadenopathy.  JVD difficult to assess. Abdomen: Bowel sounds present, abdomen soft and non-tender without masses or hernias noted. Msk:  No spine or cva tenderness. No weakness, no joint deformities or effusions. Extremities: No clubbing or cyanosis. Trace.  edema.  Neuro: Alert and oriented X 3. No focal deficits noted. Psych:  Good affect, responds appropriately Skin: No rashes or lesions noted.  Labs:   Lab Results  Component Value Date   WBC 12.1* 10/15/2014   HGB 14.4 10/15/2014   HCT 43.5 10/15/2014   MCV 96.2 10/15/2014   PLT 170 10/15/2014    Recent Labs  10/14/14 0944  INR 2.63*    Recent Labs Lab 10/14/14 0944  10/15/14 0237  NA 137  --  130*  K 3.8  --  4.1  CL 96  --  91*  CO2 25  --  24  BUN 19  --  22  CREATININE 1.10  < > 1.47*  CALCIUM 9.8  --  8.9  PROT 6.6  --   --   BILITOT 0.9  --   --   ALKPHOS 85  --   --   ALT 46  --   --   AST 22  --   --   GLUCOSE 202*  --  291*  ALBUMIN 3.3*  --   --   < > = values in this interval not displayed. MAGNESIUM  Date Value Ref Range  Status  03/08/2013 1.9 1.5 - 2.5 mg/dL Final    Recent Labs  65/99/35 1332  TROPONINI 4.91*    Recent Labs  10/14/14 1015  TROPIPOC 0.05   PRO B NATRIURETIC PEPTIDE (BNP)  Date/Time Value Ref Range Status  10/15/2014 02:37 AM 9181.0* 0 - 450 pg/mL Final  10/14/2014 09:21 AM 2372.0* 0 - 450 pg/mL Final    Echo: Study Date: 01/12/2013 LV EF: 45% -  50% Study Conclusions - Left ventricle: There was moderate concentric hypertrophy. Systolic function was mildly reduced. The estimated ejection fraction was in the range of 45% to 50%. Hypokinesis of the lateral and inferolateral myocardium. Atrial arrhythmia prevents adequate evaluation of LV diastolic function. - Ventricular septum: Septal motion showed paradox. - Aortic valve: Mild regurgitation. - Left atrium: The atrium was mildly dilated. - Right atrium: The atrium was mildly dilated.    ECG: Atrial fibrillation with rapid ventricular response Left axis deviation Non-specific intra-ventricular conduction block  Radiology:  Dg Chest Port 1 View  10/15/2014   CLINICAL DATA:  Hypoxia  EXAM: PORTABLE CHEST - 1 VIEW  COMPARISON:  Yesterday  FINDINGS: Vascular congestion and diffuse edema have developed. Upper normal heart size. No pneumothorax. Small pleural effusions are now present.  IMPRESSION: New edema and small pleural effusions.  Electronically Signed   By: Maryclare Bean M.D.   On: 10/15/2014 09:06   Dg Chest Port 1 View  10/14/2014   CLINICAL DATA:  Chest pain.  EXAM: PORTABLE CHEST - 1 VIEW  COMPARISON:  Chest x-ray 05/14/2013  FINDINGS: Prior CABG. Cardiomegaly. Pulmonary vascularity is normal. Mild infiltrate medial right lung base. No pleural effusion. Stable left pleural thickening noted consistent scarring. Degenerative changes thoracic spine both shoulders.  IMPRESSION: 1. Mild infiltrate medial right lung base. 2. Cardiomegaly.  Prior CABG.  No pulmonary venous congestion.   Electronically Signed   By:  Maisie Fus  Register   On: 10/14/2014 10:10    ASSESSMENT AND PLAN:    Active Problems:   Essential hypertension   CAD (coronary artery disease) of artery bypass graft   Chronic anticoagulation   Cardiomyopathy, ischemic EF 25% at one time most recent cath EF 40-45% 01/2013   Diabetes mellitus   CAP (community acquired pneumonia)   Acute respiratory failure with hypoxia   Pulmonary edema   Atrial fibrillation with RVR   Paralyzed hemidiaphragm  [name] is a 73 y.o. 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  Acute respiratory failure with hypoxia: Pulmonary edema on CXR today, new from yesterday -- Cardiomyopathy, ischemic, EF 40-45% 01/2013 -- Possible CAP vs influenza. Continue ABx per IM. Influenza PCR in process. Will order a procalcitonin Current picture is consistent with mixed etiology: likely some sort of pulmonary infection that later set off atrial fibrillation with RVR that likely contributed to today's pulmonary edema noting pro BNP has risen from 2K --> 9.1K -- Placed on Lasix 60mg  IV BID. Will increase to 80 mg TID. -- Strict I/Os -- Pending 2d ECHO   CAD s/p CABG 1998. Last cath in 01/2013 with severe CAD treated medically -- NSTEMI vs demand ischemia  -- Troponin initially normal but now noted to be elevated at 4.91 -- Continue to cycle enzymes.  -- Continue ASA, BB and statin  -- On warfarin. INR 2.63. Will need to discontinue this in the case he needs a cardiac cath. Place on heparin when INR is less than 2.    Atrial fibrillation with RVR- chronic Afib  -- Initially RVR and placed on cardizem. Now rate well controlled so weaned off Cardizem infusion to off -- Continue home continue home lopressor 37.5mg  in AM and 25mg  in PM. Will discontinue this and put on Toprol XL 25mg  BID -- On  warfarin. INR 2.63. Will need to discontinue this in the case he needs a cardiac cath. Place on heparin when INR is less than 2.    AKI- creat rising. 1.1--> 1.47. 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 02/2013 with pulmonology as an outpatient  Signed: , PA-C 10/15/2014 2:57 PM  Pager [phone]  Co-Sign MD  Patient seen with PA, agree with the above note.  1. Atrial fibrillation: Chronic, likely permanent.  Would use Toprol XL 25 mg bid for rate control.  As above, hold warfarin for possible cardiac cath.  Heparin gtt when INR < 2.  2. Acute on chronic  systolic CHF: Patient is volume overloaded on exam with JVD and lung crackles.  Pulmonary edema on CXR.  I reviewed today's echo, EF is about 25% with wall motion abnormalities.  This is down from the past. He is tachypneic and appears uncomfortable.  - Increase Lasix to 80 mg IV every 8 hrs, would give a dose now.  - Hold ACEI with soft BP and elevated creatinine.  - Would avoid inotrope if possible given NSTEMI.  - Will need LHC/RHC when INR < 1.8 if creatinine allows.  3. ID: Possible PNA, fever earlier in stay.  Mild WBC elevation.  I do not think this is the only process but may play a role.  - Check procalcitonin - Antibiotics per primary service - check for influenza 4. Elevated left hemidiaphragm: Chronic dyspnea.  No history of smoking so doubt component of emphysema.  5. CAD: h/o CABG.  NSTEMI this admission with TnI 4.9.  EF lower compared to 1/14, so concern for true ACS with plaque rupture rather than demand ischemia.  No chest pain now, had CP/dyspnea at admission.  Has chronic LBBB.   - Hold coumadin, heparin gtt when INR < 2 - Continue ASA, statin.  - LHC/RHC when INR < 1.8  if creatinine stable.  6. AKI: Creatinine up to 1.47.  Will have to watch carefully with diuresis.  He is short of breath and volume overloaded so will increase Lasix.    Marca Ancona 10/15/2014 4:09 PM

## 2014-10-15 NOTE — Progress Notes (Signed)
*  PRELIMINARY RESULTS* Echocardiogram 2D Echocardiogram has been performed.  [name] 10/15/2014, 2:43 PM

## 2014-10-15 NOTE — Progress Notes (Signed)
[facility]  HARCE VOLDEN WHQ:[id number] DOB: [birth year] DOA: 10/14/2014 PCP: Thayer Headings, MD  Brief narrative: 73 year old male patient with past medical history of ischemic cardiomyopathy, known CAD involving bypass graft currently on medical therapy, mild systolic dysfunction with an EF of 40-45%, and diabetes who presented to the emergency department c/o shortness of breath ongoing for 4 days. He reported coughing without fever or other constitutional signs. This shortness of breath is different from his baseline which is caused by an issue with a paralyzed left hemidiaphragm; he is followed by Dr. Shelle Iron as an outpatient for this. Patient also has chronic exertional chest pain and only occasional resting chest pain.  He also has chronic back pain due to spinal stenosis and is unable to lay flat in the bed.  Evaluation in the ER revealed mild leukocytosis with a white count of 12,000, chest x-ray demonstrated mild interstitial infiltrates in the right base.  It was felt his presenting symptoms were likely related to community-acquired pneumonia and he was started on empiric antibiotic therapy. There was some concern he may have mild heart failure exacerbation related to the acute illness noting his proBNP was 2372 but this was not much higher than his apparent baseline which is between 2300 and 2400.  After admission patient developed atrial fibrillation with rapid ventricular response and was subsequently started on IV Cardizem drip. He later developed a rectal temp of 102.5 rectally.  Subsequently his antibiotics were broadened.  By 11/2 patient continued to require high levels of oxygen at 6 L/m. His ventricular rate was better controlled in the 70s and the Cardizem was weaned and discontinued. Blood pressures remained soft in the 90 systolic range. His creatinine had increased. He was noted to have focal lower extremity edema around 2+ on the left  lower extremity and 1+ on the right lower extremity.  He had developed bilateral crackles on exam. Follow-up chest x-ray was consistent with pulmonary edema so Lasix was started and influenza PCR was checked.  HPI/Subjective: Continues to endorse dyspnea at rest and generalized fatigue.  Patient and wife say blood pressure is chronically low.  Assessment/Plan:  Acute respiratory failure with hypoxia:   A) Cardiomyopathy, ischemic,EF 40-45% 01/2013 per cath / Pulmonary edema   B) CAP vs influenza Current picture is consistent with mixed etiology: likely pulmonary infection that later set off atrial fibrillation with RVR that likely contributed to pulmonary edema noting pro BNP has risen to 9181 since admission - could also have a viral pneumonitis so we'll check influenza PCR - continue empiric antibiotics - begin twice a day Lasix and monitor for response - continue supportive care - Check 2-D echocardiogram to evaluate for progression of heart failure - Repeat ECHO this admit with NEW progressive systolic dysfunction - EF now down to 25% - multiple RWMA which are worse than seen on 2014 ECHO    CAD of artery bypass graft On medical therapy as outpatient - because of soft blood pressures and need for adequate diuresis and renal perfusion had temporarily stopped Imdur but will continue beta blocker in setting of A Fib with RVR - currently no chest pain - as precaution we need to cycle cardiac enzymes; initial troponin normal in ER - 2:49 pm: Repeat TNI now up to 4.91- no CP- BP still soft so will use NTP 1/2" and consult Cards-anticoagulated on Warfarin  Diabetes mellitus Current CBGs greater than 200 so we'll continue Lantus as is but increase  sliding scale insulin - hemoglobin A1c 8.6 so may need to to consider adjusting up Lantus dosage once acute illness resolved - continue meal coverage  Atrial fibrillation with RVR Rate now well controlled so have weaned Cardizem infusion to off - continue  beta blocker  Essential hypertension Blood pressure actually soft with systolic readings primarily in the 90s  Chronic anticoagulation On warfarin at home-INR therapeutic at presentation-pharmacy to manage dosing  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 Stann Mainland with pulmonology as an outpatient  DVT prophylaxis: warfarin Code Status: FULL Family Communication: wife and other family members at bedside Disposition Plan/Expected LOS: stepdown  Consultants: none  Procedures: 2-D echocardiogram: - Left ventricle: The cavity size was normal. Wall thickness was increased in a pattern of mild LVH. Diffuse hypokinesis with akinesis of the apex, the apical septal wall, and the inferolateral wall. Indeterminant diastolic function. The estimated ejection fraction was 25%. - Aortic valve: There was no stenosis. There was trivial regurgitation. - Mitral valve: Moderately calcified annulus. Mildly calcified leaflets . There was moderate regurgitation. - Left atrium: The atrium was moderately dilated. - Right ventricle: Systolic function was mildly reduced. - Right atrium: The atrium was mildly to moderately dilated. - Pulmonary arteries: No complete TR doppler jet so unable to estimate PA systolic pressure. - Systemic veins: IVC measured 1.8 cm with < 50% respirophasic variation, suggesting RA pressure 8 mmHg.  Antibiotics: Maxipime 11/1 >> Ceftriaxone 11/1 Doxycycline 11/1  Vancomycin 11/1 >>  Objective: Blood pressure 113/78, pulse 71, temperature 98 F (36.7 C), temperature source Oral, resp. rate 28, height 6' (1.829 m), weight 217 lb 2.5 oz (98.5 kg), SpO2 92 %.  Intake/Output Summary (Last 24 hours) at 10/15/14 1214 Last data filed at 10/15/14 1000  Gross per 24 hour  Intake   1536 ml  Output    925 ml  Net    611 ml    Exam: Gen: mild acute respiratory distress Chest:  bilateral crackles more prominent in the bases, 6 L oxygen, effort appears labored b Cardiac: Regular rate and rhythm, S1-S2, no rubs murmurs or gallops, bilateral lower extremity peripheral edema 2+ left greater than right which is 1+, no JVD Abdomen: Soft nontender nondistended without obvious hepatosplenomegaly, no ascites Extremities: Symmetrical in appearance without cyanosis, clubbing or effusion  Scheduled Meds:  Scheduled Meds: . aspirin EC  81 mg Oral Daily  . ceFEPime (MAXIPIME) IV  1 g Intravenous 3 times per day  . fluticasone  2 spray Each Nare Daily  . furosemide  60 mg Intravenous Q12H  . heparin  5,000 Units Subcutaneous 3 times per day  . insulin aspart  0-20 Units Subcutaneous TID WC  . insulin aspart  0-5 Units Subcutaneous QHS  . insulin aspart  3 Units Subcutaneous TID WC  . insulin glargine  32 Units Subcutaneous Daily  . levothyroxine  175 mcg Oral Daily  . linagliptin  5 mg Oral Daily  . metoprolol tartrate  25 mg Oral QHS  . metoprolol tartrate  37.5 mg Oral Daily  . pantoprazole  40 mg Oral BID  . predniSONE  10 mg Oral Q breakfast  . rosuvastatin  10 mg Oral Daily  . sodium chloride  3 mL Intravenous Q12H  . sodium chloride  3 mL Intravenous Q12H  . vancomycin  1,250 mg Intravenous Q12H   Data Reviewed: Basic Metabolic Panel:  Recent Labs Lab 10/14/14 0944 10/14/14 1340 10/15/14 0237  NA 137  --  130*  K 3.8  --  4.1  CL 96  --  91*  CO2 25  --  24  GLUCOSE 202*  --  291*  BUN 19  --  22  CREATININE 1.10 1.15 1.47*  CALCIUM 9.8  --  8.9   Liver Function Tests:  Recent Labs Lab 10/14/14 0944  AST 22  ALT 46  ALKPHOS 85  BILITOT 0.9  PROT 6.6  ALBUMIN 3.3*   CBC:  Recent Labs Lab 10/14/14 0944 10/14/14 1340 10/15/14 0237  WBC 12.7* 10.6* 12.1*  NEUTROABS 10.6*  --   --   HGB 15.3 14.6 14.4  HCT 45.6 43.3 43.5  MCV 95.4 95.6 96.2  PLT 145* 143* 170   BNP (last 3 results)  Recent Labs  10/14/14 0921 10/15/14 0237   PROBNP 2372.0* 9181.0*   CBG:  Recent Labs Lab 10/14/14 1248 10/14/14 1517 10/14/14 1626 10/14/14 2109 10/15/14 0828  GLUCAP 205* 256* 219* 282* 239*    Recent Results (from the past 240 hour(s))  MRSA PCR Screening     Status: None   Collection Time: 10/14/14  6:37 PM  Result Value Ref Range Status   MRSA by PCR NEGATIVE NEGATIVE Final    Comment:        The GeneXpert MRSA Assay (FDA approved for NASAL specimens only), is one component of a comprehensive MRSA colonization surveillance program. It is not intended to diagnose MRSA infection nor to guide or monitor treatment for MRSA infections.      Studies:  Recent x-ray studies have been reviewed in detail by the Attending Physician  Time spent :  35 mins  Junious Silk, ANP [facility] Office  [phone] Pager [phone]  **If unable to reach the above provider after paging please contact the Flow Manager @ [phone]  On-Call/Text Page:      [email or web address]      password [email or web address]  If 7PM-7AM, please contact night-coverage [email or web address] Password [email or web address] 10/15/2014, 12:14 PM   LOS: 1 day   I have personally examined this patient and reviewed the entire database. I have reviewed the above note, made any necessary editorial changes, and agree with its content.  Lonia Blood, MD [facility]

## 2014-10-16 ENCOUNTER — Encounter (HOSPITAL_COMMUNITY)
Admission: EM | Disposition: A | Discharge status: Home Health | Payer: Self-pay | Source: Home / Self Care | Attending: Cardiology

## 2014-10-16 ENCOUNTER — Inpatient Hospital Stay (HOSPITAL_COMMUNITY): Payer: Medicare Other

## 2014-10-16 DIAGNOSIS — E1165 Type 2 diabetes mellitus with hyperglycemia: Secondary | ICD-10-CM | POA: Insufficient documentation

## 2014-10-16 DIAGNOSIS — I5043 Acute on chronic combined systolic (congestive) and diastolic (congestive) heart failure: Secondary | ICD-10-CM

## 2014-10-16 DIAGNOSIS — I5023 Acute on chronic systolic (congestive) heart failure: Secondary | ICD-10-CM | POA: Insufficient documentation

## 2014-10-16 DIAGNOSIS — IMO0002 Reserved for concepts with insufficient information to code with codable children: Secondary | ICD-10-CM | POA: Insufficient documentation

## 2014-10-16 DIAGNOSIS — I2510 Atherosclerotic heart disease of native coronary artery without angina pectoris: Secondary | ICD-10-CM

## 2014-10-16 HISTORY — PX: LEFT AND RIGHT HEART CATHETERIZATION WITH CORONARY/GRAFT ANGIOGRAM: SHX5448

## 2014-10-16 LAB — BASIC METABOLIC PANEL
ANION GAP: 16 — AB (ref 5–15)
BUN: 25 mg/dL — ABNORMAL HIGH (ref 6–23)
CALCIUM: 8.9 mg/dL (ref 8.4–10.5)
CO2: 24 mEq/L (ref 19–32)
Chloride: 91 mEq/L — ABNORMAL LOW (ref 96–112)
Creatinine, Ser: 1.5 mg/dL — ABNORMAL HIGH (ref 0.50–1.35)
GFR calc Af Amer: 51 mL/min — ABNORMAL LOW (ref >90)
GFR calc non Af Amer: 44 mL/min — ABNORMAL LOW (ref >90)
GLUCOSE: 195 mg/dL — AB (ref 70–99)
Potassium: 3.9 mEq/L (ref 3.7–5.3)
SODIUM: 131 meq/L — AB (ref 137–147)

## 2014-10-16 LAB — POCT I-STAT 3, VENOUS BLOOD GAS (G3P V)
ACID-BASE EXCESS: 3 mmol/L — AB (ref 0.0–2.0)
Bicarbonate: 28.4 mEq/L — ABNORMAL HIGH (ref 20.0–24.0)
O2 Saturation: 46 %
TCO2: 30 mmol/L (ref 0–100)
pCO2, Ven: 43.2 mmHg — ABNORMAL LOW (ref 45.0–50.0)
pH, Ven: 7.426 — ABNORMAL HIGH (ref 7.250–7.300)
pO2, Ven: 25 mmHg — CL (ref 30.0–45.0)

## 2014-10-16 LAB — CARBOXYHEMOGLOBIN
Carboxyhemoglobin: 1.1 % (ref 0.5–1.5)
METHEMOGLOBIN: 1 % (ref 0.0–1.5)
O2 Saturation: 87.3 %
Total hemoglobin: 13.2 g/dL — ABNORMAL LOW (ref 13.5–18.0)

## 2014-10-16 LAB — CBC
HCT: 42.9 % (ref 39.0–52.0)
Hemoglobin: 14.3 g/dL (ref 13.0–17.0)
MCH: 31.4 pg (ref 26.0–34.0)
MCHC: 33.3 g/dL (ref 30.0–36.0)
MCV: 94.3 fL (ref 78.0–100.0)
Platelets: 146 10*3/uL — ABNORMAL LOW (ref 150–400)
RBC: 4.55 MIL/uL (ref 4.22–5.81)
RDW: 15.4 % (ref 11.5–15.5)
WBC: 13 10*3/uL — AB (ref 4.0–10.5)

## 2014-10-16 LAB — PRO B NATRIURETIC PEPTIDE: PRO B NATRI PEPTIDE: 15184 pg/mL — AB (ref 0–450)

## 2014-10-16 LAB — PROTIME-INR
INR: 1.81 — ABNORMAL HIGH (ref 0.00–1.49)
Prothrombin Time: 21.1 seconds — ABNORMAL HIGH (ref 11.6–15.2)

## 2014-10-16 LAB — GLUCOSE, CAPILLARY
GLUCOSE-CAPILLARY: 178 mg/dL — AB (ref 70–99)
GLUCOSE-CAPILLARY: 209 mg/dL — AB (ref 70–99)
GLUCOSE-CAPILLARY: 201 mg/dL — AB (ref 70–99)
GLUCOSE-CAPILLARY: 232 mg/dL — AB (ref 70–99)

## 2014-10-16 LAB — POCT I-STAT 3, ART BLOOD GAS (G3+)
Acid-Base Excess: 1 mmol/L (ref 0.0–2.0)
Bicarbonate: 24.5 mEq/L — ABNORMAL HIGH (ref 20.0–24.0)
O2 Saturation: 94 %
PH ART: 7.442 (ref 7.350–7.450)
TCO2: 26 mmol/L (ref 0–100)
pCO2 arterial: 35.8 mmHg (ref 35.0–45.0)
pO2, Arterial: 69 mmHg — ABNORMAL LOW (ref 80.0–100.0)

## 2014-10-16 LAB — LEGIONELLA ANTIGEN, URINE

## 2014-10-16 LAB — TROPONIN I: Troponin I: 2.95 ng/mL (ref <0.30)

## 2014-10-16 LAB — PROCALCITONIN: Procalcitonin: 0.61 ng/mL

## 2014-10-16 SURGERY — LEFT AND RIGHT HEART CATHETERIZATION WITH CORONARY/GRAFT ANGIOGRAM
Anesthesia: LOCAL

## 2014-10-16 MED ORDER — AMIODARONE HCL IN DEXTROSE 360-4.14 MG/200ML-% IV SOLN
INTRAVENOUS | Status: AC
  Filled 2014-10-16: qty 200

## 2014-10-16 MED ORDER — SODIUM CHLORIDE 0.9 % IV SOLN
INTRAVENOUS | Status: DC
  Administered 2014-10-16 (×2): via INTRAVENOUS

## 2014-10-16 MED ORDER — LIDOCAINE HCL (PF) 1 % IJ SOLN
INTRAMUSCULAR | Status: AC
  Filled 2014-10-16: qty 30

## 2014-10-16 MED ORDER — FUROSEMIDE 10 MG/ML IJ SOLN
INTRAMUSCULAR | Status: AC
  Filled 2014-10-16: qty 4

## 2014-10-16 MED ORDER — FUROSEMIDE 10 MG/ML IJ SOLN
12.0000 mg/h | INTRAVENOUS | Status: DC
  Administered 2014-10-16: 10 mg/h via INTRAVENOUS
  Filled 2014-10-16 (×4): qty 25

## 2014-10-16 MED ORDER — SODIUM CHLORIDE 0.9 % IJ SOLN: 3.0000 mL | INTRAMUSCULAR | Status: DC | PRN

## 2014-10-16 MED ORDER — AMIODARONE LOAD VIA INFUSION
150.0000 mg | Freq: Once | INTRAVENOUS | Status: AC
  Administered 2014-10-16: 150 mg via INTRAVENOUS
  Filled 2014-10-16: qty 83.34

## 2014-10-16 MED ORDER — SODIUM CHLORIDE 0.9 % IJ SOLN
3.0000 mL | Freq: Two times a day (BID) | INTRAMUSCULAR | Status: DC
  Administered 2014-10-17: 3 mL via INTRAVENOUS

## 2014-10-16 MED ORDER — NITROGLYCERIN 1 MG/10 ML FOR IR/CATH LAB
INTRA_ARTERIAL | Status: AC
  Filled 2014-10-16: qty 10

## 2014-10-16 MED ORDER — SODIUM CHLORIDE 0.9 % IV SOLN: 250.0000 mL | INTRAVENOUS | Status: DC | PRN

## 2014-10-16 MED ORDER — AMIODARONE HCL IN DEXTROSE 360-4.14 MG/200ML-% IV SOLN
60.0000 mg/h | INTRAVENOUS | Status: AC
  Administered 2014-10-16: 60 mg/h via INTRAVENOUS
  Filled 2014-10-16: qty 200

## 2014-10-16 MED ORDER — INSULIN GLARGINE 100 UNIT/ML ~~LOC~~ SOLN
37.0000 [IU] | Freq: Every day | SUBCUTANEOUS | Status: DC
  Administered 2014-10-17 – 2014-10-23 (×7): 37 [IU] via SUBCUTANEOUS
  Filled 2014-10-16 (×7): qty 0.37

## 2014-10-16 MED ORDER — HEPARIN SODIUM (PORCINE) 1000 UNIT/ML IJ SOLN
INTRAMUSCULAR | Status: AC
  Filled 2014-10-16: qty 1

## 2014-10-16 MED ORDER — HEPARIN (PORCINE) IN NACL 100-0.45 UNIT/ML-% IJ SOLN
1800.0000 [IU]/h | INTRAMUSCULAR | Status: DC
  Administered 2014-10-16: 1100 [IU]/h via INTRAVENOUS
  Administered 2014-10-17 – 2014-10-18 (×2): 1800 [IU]/h via INTRAVENOUS
  Filled 2014-10-16 (×5): qty 250

## 2014-10-16 MED ORDER — VERAPAMIL HCL 2.5 MG/ML IV SOLN
INTRAVENOUS | Status: AC
  Filled 2014-10-16: qty 2

## 2014-10-16 MED ORDER — AMIODARONE HCL IN DEXTROSE 360-4.14 MG/200ML-% IV SOLN
30.0000 mg/h | INTRAVENOUS | Status: DC
  Administered 2014-10-16 – 2014-10-19 (×6): 30 mg/h via INTRAVENOUS
  Filled 2014-10-16 (×12): qty 200

## 2014-10-16 MED ORDER — FUROSEMIDE 10 MG/ML IJ SOLN
40.0000 mg | Freq: Once | INTRAMUSCULAR | Status: AC
  Administered 2014-10-16: 40 mg via INTRAVENOUS

## 2014-10-16 MED ORDER — HEPARIN (PORCINE) IN NACL 2-0.9 UNIT/ML-% IJ SOLN
INTRAMUSCULAR | Status: AC
  Filled 2014-10-16: qty 1000

## 2014-10-16 MED ORDER — MILRINONE IN DEXTROSE 20 MG/100ML IV SOLN
0.1250 ug/kg/min | INTRAVENOUS | Status: DC
  Administered 2014-10-16 – 2014-10-18 (×3): 0.125 ug/kg/min via INTRAVENOUS
  Filled 2014-10-16 (×3): qty 100

## 2014-10-16 NOTE — Progress Notes (Signed)
Pt complaining of chest pain, pt in afib and heart rate jumping to the 160s, dr. [name] notified, ekg completed and dr. [name] at bedside, wife at bedside

## 2014-10-16 NOTE — H&P (View-Only) (Signed)
Patient ID: [name], male   DOB: 24-Dec-1940, 73 y.o.   MRN: [id number]   SUBJECTIVE: Patient developed more chest pain this morning, then HR increased (chronic atrial fibrillation).  Currently, HR up to 130s-140s at times.  He is still having chest pain.  Troponin peaked at 4.9 yesterday and is trending down.  He had some diuresis with increased Lasix yesterday. INR 1.8 today, heparin gtt started.   Scheduled Meds: . amiodarone      . amiodarone  150 mg Intravenous Once  . aspirin EC  81 mg Oral Daily  . ceFEPime (MAXIPIME) IV  1 g Intravenous 3 times per day  . fluticasone  2 spray Each Nare Daily  . furosemide  80 mg Intravenous TID  . insulin aspart  0-20 Units Subcutaneous TID WC  . insulin aspart  0-5 Units Subcutaneous QHS  . insulin aspart  3 Units Subcutaneous TID WC  . insulin glargine  32 Units Subcutaneous Daily  . levothyroxine  175 mcg Oral Daily  . linagliptin  5 mg Oral Daily  . metoprolol succinate  25 mg Oral BID  . nitroGLYCERIN  0.5 inch Topical 4 times per day  . pantoprazole  40 mg Oral BID  . predniSONE  10 mg Oral Q breakfast  . rosuvastatin  20 mg Oral Daily  . sodium chloride  3 mL Intravenous Q12H  . sodium chloride  3 mL Intravenous Q12H  . vancomycin  750 mg Intravenous Q12H   Continuous Infusions: . amiodarone 60 mg/hr (10/16/14 0743)   Followed by  . amiodarone    . heparin     PRN Meds:.sodium chloride, acetaminophen **OR** acetaminophen, HYDROcodone-acetaminophen, metoprolol, nitroGLYCERIN, ondansetron **OR** ondansetron (ZOFRAN) IV, polyethylene glycol, sodium chloride    Filed Vitals:   10/16/14 0007 10/16/14 0200 10/16/14 0400 10/16/14 0500  BP:  117/79    Pulse:   118   Temp: 98 F (36.7 C)   98.7 F (37.1 C)  TempSrc: Oral   Oral  Resp:  22    Height:      Weight:    218 lb 11.1 oz (99.2 kg)  SpO2:  89%      Intake/Output Summary (Last 24 hours) at 10/16/14 0745 Last data filed at 10/16/14 0600  Gross per 24 hour    Intake 1247.5 ml  Output   2200 ml  Net -952.5 ml    LABS: Basic Metabolic Panel:  Recent Labs  10/62/69 0237 10/16/14 0053  NA 130* 131*  K 4.1 3.9  CL 91* 91*  CO2 24 24  GLUCOSE 291* 195*  BUN 22 25*  CREATININE 1.47* 1.50*  CALCIUM 8.9 8.9   Liver Function Tests:  Recent Labs  10/14/14 0944  AST 22  ALT 46  ALKPHOS 85  BILITOT 0.9  PROT 6.6  ALBUMIN 3.3*   No results for input(s): LIPASE, AMYLASE in the last 72 hours. CBC:  Recent Labs  10/14/14 0944  10/15/14 0237 10/16/14 0053  WBC 12.7*  < > 12.1* 13.0*  NEUTROABS 10.6*  --   --   --   HGB 15.3  < > 14.4 14.3  HCT 45.6  < > 43.5 42.9  MCV 95.4  < > 96.2 94.3  PLT 145*  < > 170 146*  < > = values in this interval not displayed. Cardiac Enzymes:  Recent Labs  10/15/14 1332 10/15/14 1947 10/16/14 0053  TROPONINI 4.91* 4.44* 2.95*   BNP: Invalid input(s): POCBNP D-Dimer: No results for  input(s): DDIMER in the last 72 hours. Hemoglobin A1C:  Recent Labs  10/14/14 1340  HGBA1C 8.6*   Fasting Lipid Panel: No results for input(s): CHOL, HDL, LDLCALC, TRIG, CHOLHDL, LDLDIRECT in the last 72 hours. Thyroid Function Tests: No results for input(s): TSH, T4TOTAL, T3FREE, THYROIDAB in the last 72 hours.  Invalid input(s): FREET3 Anemia Panel: No results for input(s): VITAMINB12, FOLATE, FERRITIN, TIBC, IRON, RETICCTPCT in the last 72 hours.  RADIOLOGY: Dg Chest Port 1 View  10/15/2014   CLINICAL DATA:  Hypoxia  EXAM: PORTABLE CHEST - 1 VIEW  COMPARISON:  Yesterday  FINDINGS: Vascular congestion and diffuse edema have developed. Upper normal heart size. No pneumothorax. Small pleural effusions are now present.  IMPRESSION: New edema and small pleural effusions.   Electronically Signed   By: Maryclare Bean M.D.   On: 10/15/2014 09:06   Dg Chest Port 1 View  10/14/2014   CLINICAL DATA:  Chest pain.  EXAM: PORTABLE CHEST - 1 VIEW  COMPARISON:  Chest x-ray 05/14/2013  FINDINGS: Prior CABG.  Cardiomegaly. Pulmonary vascularity is normal. Mild infiltrate medial right lung base. No pleural effusion. Stable left pleural thickening noted consistent scarring. Degenerative changes thoracic spine both shoulders.  IMPRESSION: 1. Mild infiltrate medial right lung base. 2. Cardiomegaly.  Prior CABG.  No pulmonary venous congestion.   Electronically Signed   By: Maisie Fus  Register   On: 10/14/2014 10:10    PHYSICAL EXAM General: NAD Neck: JVP 12-14 cm, no thyromegaly or thyroid nodule.  Lungs: Crackles at bases bilaterally. CV: Nondisplaced PMI.  Heart tachy, irregular S1/S2, no S3/S4, no murmur.  Trace ankle edema.  = Abdomen: Soft, nontender, no hepatosplenomegaly, no distention.  Neurologic: Alert and oriented x 3.  Psych: Anxious Extremities: No clubbing or cyanosis.   TELEMETRY: Reviewed telemetry pt in atrial fibrillation  ASSESSMENT AND PLAN: 1. Atrial fibrillation: Chronic, likely permanent. Rate driven higher probably by chest pain this morning.  Continue Toprol XL but will add amiodarone now for rate control. Holding warfarin for cath, INR 1.8 this morning and on heparin gtt.  2. Acute on chronic systolic CHF: Patient is volume overloaded on exam with JVD and lung crackles. Pulmonary edema on CXR.  Echo on 11/12 showed that EF is about 25% with wall motion abnormalities. This is down from the past. He diuresed reasonably on higher Lasix yesterday.  - Continue IV Lasix, hold dose prior to cath this morning.   - Hold ACEI with soft BP and elevated creatinine.  - Would avoid inotrope if possible given NSTEMI.  - LHC/RHC today.   3. ID: Possible PNA, fever earlier in stay. Mild WBC elevation. Procalcitonin is elevated.  I do not think this is the only process but may play a role. Influenza negative.  - Antibiotics per primary service 4. Elevated left hemidiaphragm: Chronic dyspnea. No history of smoking so doubt component of emphysema.  5. CAD: h/o CABG. NSTEMI this  admission with TnI 4.9, now trending down. EF lower compared to 1/14, so concern for true ACS with plaque rupture rather than demand ischemia. Has chronic LBBB.He is having ongoing active chest pain this morning.  INR now down to 1.8, creatinine stable at 1.5 but up from baseline.   - Heparin gtt now that INR < 2.  - Continue ASA, statin.  - LHC/RHC today, will try to get this done soon with active CP.  6. AKI: Creatinine up to 1.5. Given ongoing pain/NSTEMI, think that he will need cath this morning.  Will  need to minimize contrast as much as possible, no LV-gram.   Marca Ancona 10/16/2014 7:51 AM

## 2014-10-16 NOTE — CV Procedure (Signed)
CARDIAC CATHETERIZATION REPORT  NAME:  [name]   MRN: [id number] DOB:  [birth year]   ADMIT DATE: 10/14/2014 Procedure Date: 10/16/2014  INTERVENTIONAL CARDIOLOGIST: Leonie Man, M.D., MS PRIMARY CARE PROVIDER: Thressa Sheller, MD PRIMARY CARDIOLOGIST: Lorretta Harp., MD REFERRING CARDIOLOGIST: Loralie Champagne, MD  PATIENT:  [name] is a 73 y.o. malewITH HISTORY OF KNOWN cad STATUS POST cabg IN 1998 WITH A MODERATE ISCHEMIC CARDIOPATHY ef 40-45% AND CHRONIC ATRIAL FIBRILLATION. He also has hypertension, diabetes and hyperlipidemia. 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 He presented to [facility] on November 1 for severe dyspnea and chest pain/pressure, he is ruled in for non-ST elevation MI in his been found to be in A. Fib with RVR. Initial plans were to stabilizing and then pursue cardiac catheterization once more stable. Fortunately, he developed more chest pain this morning with rapid A. Fib. With positive troponins and recurrent pain, he was seen by Dr. Aundra Dubin who felt that we needed to proceed with cardiac catheterization. He's been referred for right and left heart catheterization. With an INR of 1.8, Physician will be done via the radial approach.  He has baseline renal insufficiency therefore a ventriculogram will not the performed.  PRE-OPERATIVE DIAGNOSIS:    Non-STEMI  A. 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  PROCEDURES PERFORMED:    Right & Left Heart Catheterization with Native Coronary and Graft Angiography via RIGHT COMMON FEMORA Artery & Vein Access.  INTRA-AORTIC BALLOON PUMP PLACEMENT - for decompensated Combined CHF  PROCEDURE: The patient was brought to the [facility] in the fasting state and prepped  and draped in the usual sterile fashion for left radial or right femoral arterial and right femoral venous access. A modified Allen's test was performed on the left wrist demonstrating minimal but adequate collateral flow. Sterile technique was used including antiseptics, cap, gloves, gown, hand hygiene, mask and sheet. Skin prep: Chlorhexidine.   Consent: Risks of procedure as well as the alternatives and risks of each were explained to the (patient/caregiver). Consent for procedure obtained.   Time Out: Verified patient identification, verified procedure, site/side was marked, verified correct patient position, special equipment/implants available, medications/allergies/relevent history reviewed, required imaging and test results available. Performed.  Access:  Left Radial Artery: 6 Fr sheath -- Seldinger technique using Angiocath Micropuncture Kit 10 mL radial cocktail IA;  Shortly after Obtaining Radial Access - RHC numbers reviewed with referring MD, the decision was made to place IABP, therefore, RFA access was obtained & LHC-Angio was performed fromFemoral Approach.  VASC band:  1045 hours; 15 mL, once LIMA graft injected from RFA approach.  --> reduced to 12 mL Air @ 11:15 Right Common Femoral Artery: 5 Fr Sheath - fluoroscopically guided modified Seldinger Technique  Right Common Femoral Vein: 7 Fr Sheath - Seldinger Technique..  Right Heart Catheterization: 7 Fr Gordy Councilman catheter advanced under fluoroscopy with balloon inflated to the RA, RV, then PCWP-PA for hemodynamic measurement.   A 0.25 mm wire was used to assist advancing the catheter into the WEDGE position.  Once pressures recorded, the cathter was pulled back proximal to the WEDGE position. Simultaneous FA & PA blood gases checked for SaO2% to calculate FICK CO/CI  Thermodilution Injections performed to calculate CO/CI  Simultaneous PCWP/LV &  RV/LV pressures monitored with Angled Pigtail in LV.  Catheter removed completely out  of the body with balloon deflated. (Unfortunately, once the initial #s were checked - the decision was made to keep RHC in place, so a new catheter was inserted using a protective catheter cover) -- advanced to 75 cm, but unable to "WEDGE" due to a dilated RA with catheter loop.  Left Heart Catheterization: 5Fr Catheters advanced or exchanged over a J-wire; JR catheter advanced first.  LIMA-LAD Cineangiography: JR4 Catheter redirected into Left Subclavian Artery  Right Coronary Artery, SVG-RCA & SVG-OM Cineangiography: JR4 Catheter (RCB used to attempt better visualization of SVG-RCA  Left Coronary Artery Cineangiography: JL4 Catheter  LV Hemodynamics (LV Gram): RCB   RFA & RFV Sheaths sutured in place & dressed.   FINDINGS:  Hemodynamics:  Findings:   SaO2% ;on 100% NRB Pressures mmHg  Mean P  mmHg  EDP  mmHg   Right Atrium   */20  20  Right Ventricle   69/11  17   Pulmonary Artery  46 69/36  35   PCWP   **/35 35    Central Aortic  94 111/77 89   Left Ventricle   105/29  35         Cardiac Output:   Cardiac Index:    Fick  3.15  1.43   Thermodilution  3.61  1.63    Left Ventriculography:  Deferred to conserve contrast  Coronary Anatomy:  Left Main: Large caliber vessel with notable calcification, but no significant stenosis. Left Anterior Descending (LAD): Likely large caliber vessel with diffuse proximal disease, then occluded just after the First Diagonal (D1), which is also essentially occluded. Circumflex (LCx): ~90-95% mid-vessel stenosis with to-fro-flow noted in the major lateral OM. The distal vessel continues as a very small caliber, diffusely diseased Left posterolateral branch.  Right Coronary Artery: dominant with a 60% proximal eccentric stenosis. The PDA had minimal flow and was 100% totally occluded. The continuation to the significant PLA branch is widely patent.  posterior descending artery: there is an occluded stent at what appears to be the ostium.   Fills retrograde via LAD-PDA collaterals after LIMA-LAD  posterior lateral branch: Fills both antegrade from the native RCA rettrograde (as well as PDA-PL collaterals) from the SVG-PDA.  Grafts  LIMA - Diag - LAD: Widely patent with excellent antegrade flow down both the diagonal and the LAD.  SVG - OM: Widely patent to a large lateral OM. Retrograde flow fills back to the AV Groove Circumflex and fills a smaller OM and a LPL branch.  SVG - RPDA: 100% aorta ostial occluded  After reviewing the initial angiography, the culprit lesion was thought to be the occluded SVG-RPDA.  Based upon the severity of the right heart catheter numbers and decompensated heart failure, the decision was made to place an Intra-Aortic Balloon Pump (IABP)  INTRA-AORTIC BALLOON PUMP PLACEMENT:   5 French RFA sheath exchanged for 7.5 French IABP sheath  40 cm IABP advanced over Kit wire to the proximal descending aorta; catheter was aspirated and then flushed.  Pressure lines were attached and after purging the balloon pump was started at 1:1  Initial aortic pressure is 104/77 mmHg with augmentation to 146 mmHg.   MEDICATIONS:  Anesthesia:  Local Lidocaine 2 mL left radial; 18 mL right groin  Sedation:  none ;   Oxygen by 100% nonrebreather  Omnipaque Contrast: 90 ml Radial Cocktail: 5 mg Verapamil, 400 mcg NTG, 2 ml 2% Lidocaine in 10  ml NS  IV Amiodarone bolus 150 mg  IV Lasix 40 mg 2  PATIENT DISPOSITION:    The patient was transferred to the PACU holding area in a hemodynamicaly stable, chest pain free condition.  The patient tolerated the procedure well, and there were no complications.   EBL: < 51m, not including ABG and VBG samples   The patient was stable before, during, and after the procedure, but required 100% non-rebreather oxygen.  POST-OPERATIVE DIAGNOSIS:    Severe decompensated Combined Right and Left Heart Failure complicated by atrial fibrillation with RVR  Severe  Secondary Pulmonary Hypertension with Left Ventricular Filling Pressures of ~~09mmHg; PA diastolic, PCWP and LVEDP pressures all are equal and correlate  Severe native vessel CAD the left system with 100% LAD, severe Circumflex disease, and moderate proximal RCA with occluded RPDA  New diagnosis of occlusion of the SVG-RPDA with collateral flow from LIMA-LAD to the PDA.  Excellent augmentation with IABP  Improved Ventricular response to Atrial Fibrillation after bolus of amiodarone  PLAN OF CARE:  The patient will be transferred to the CCU for diuresis with Swan-Ganz catheter and IABP in place.  Once he is stabilized, can consider the possibility of intervening on the proximal RCA lesion, however this does not look too much different than it did a year and a half ago.  Restart IV heparin infusion for IABP 6 hours after VASC band removal.   HLeonie Man M.D., M.S. [facility] [address] [place]  [id number] [phone] 10/16/2014 9:18 AM

## 2014-10-16 NOTE — Progress Notes (Addendum)
Patient ID: [name], male   DOB: 24-Dec-1940, 73 y.o.   MRN: [id number]   SUBJECTIVE: Patient developed more chest pain this morning, then HR increased (chronic atrial fibrillation).  Currently, HR up to 130s-140s at times.  He is still having chest pain.  Troponin peaked at 4.9 yesterday and is trending down.  He had some diuresis with increased Lasix yesterday. INR 1.8 today, heparin gtt started.   Scheduled Meds: . amiodarone      . amiodarone  150 mg Intravenous Once  . aspirin EC  81 mg Oral Daily  . ceFEPime (MAXIPIME) IV  1 g Intravenous 3 times per day  . fluticasone  2 spray Each Nare Daily  . furosemide  80 mg Intravenous TID  . insulin aspart  0-20 Units Subcutaneous TID WC  . insulin aspart  0-5 Units Subcutaneous QHS  . insulin aspart  3 Units Subcutaneous TID WC  . insulin glargine  32 Units Subcutaneous Daily  . levothyroxine  175 mcg Oral Daily  . linagliptin  5 mg Oral Daily  . metoprolol succinate  25 mg Oral BID  . nitroGLYCERIN  0.5 inch Topical 4 times per day  . pantoprazole  40 mg Oral BID  . predniSONE  10 mg Oral Q breakfast  . rosuvastatin  20 mg Oral Daily  . sodium chloride  3 mL Intravenous Q12H  . sodium chloride  3 mL Intravenous Q12H  . vancomycin  750 mg Intravenous Q12H   Continuous Infusions: . amiodarone 60 mg/hr (10/16/14 0743)   Followed by  . amiodarone    . heparin     PRN Meds:.sodium chloride, acetaminophen **OR** acetaminophen, HYDROcodone-acetaminophen, metoprolol, nitroGLYCERIN, ondansetron **OR** ondansetron (ZOFRAN) IV, polyethylene glycol, sodium chloride    Filed Vitals:   10/16/14 0007 10/16/14 0200 10/16/14 0400 10/16/14 0500  BP:  117/79    Pulse:   118   Temp: 98 F (36.7 C)   98.7 F (37.1 C)  TempSrc: Oral   Oral  Resp:  22    Height:      Weight:    218 lb 11.1 oz (99.2 kg)  SpO2:  89%      Intake/Output Summary (Last 24 hours) at 10/16/14 0745 Last data filed at 10/16/14 0600  Gross per 24 hour    Intake 1247.5 ml  Output   2200 ml  Net -952.5 ml    LABS: Basic Metabolic Panel:  Recent Labs  10/62/69 0237 10/16/14 0053  NA 130* 131*  K 4.1 3.9  CL 91* 91*  CO2 24 24  GLUCOSE 291* 195*  BUN 22 25*  CREATININE 1.47* 1.50*  CALCIUM 8.9 8.9   Liver Function Tests:  Recent Labs  10/14/14 0944  AST 22  ALT 46  ALKPHOS 85  BILITOT 0.9  PROT 6.6  ALBUMIN 3.3*   No results for input(s): LIPASE, AMYLASE in the last 72 hours. CBC:  Recent Labs  10/14/14 0944  10/15/14 0237 10/16/14 0053  WBC 12.7*  < > 12.1* 13.0*  NEUTROABS 10.6*  --   --   --   HGB 15.3  < > 14.4 14.3  HCT 45.6  < > 43.5 42.9  MCV 95.4  < > 96.2 94.3  PLT 145*  < > 170 146*  < > = values in this interval not displayed. Cardiac Enzymes:  Recent Labs  10/15/14 1332 10/15/14 1947 10/16/14 0053  TROPONINI 4.91* 4.44* 2.95*   BNP: Invalid input(s): POCBNP D-Dimer: No results for  input(s): DDIMER in the last 72 hours. Hemoglobin A1C:  Recent Labs  10/14/14 1340  HGBA1C 8.6*   Fasting Lipid Panel: No results for input(s): CHOL, HDL, LDLCALC, TRIG, CHOLHDL, LDLDIRECT in the last 72 hours. Thyroid Function Tests: No results for input(s): TSH, T4TOTAL, T3FREE, THYROIDAB in the last 72 hours.  Invalid input(s): FREET3 Anemia Panel: No results for input(s): VITAMINB12, FOLATE, FERRITIN, TIBC, IRON, RETICCTPCT in the last 72 hours.  RADIOLOGY: Dg Chest Port 1 View  10/15/2014   CLINICAL DATA:  Hypoxia  EXAM: PORTABLE CHEST - 1 VIEW  COMPARISON:  Yesterday  FINDINGS: Vascular congestion and diffuse edema have developed. Upper normal heart size. No pneumothorax. Small pleural effusions are now present.  IMPRESSION: New edema and small pleural effusions.   Electronically Signed   By: Maryclare Bean M.D.   On: 10/15/2014 09:06   Dg Chest Port 1 View  10/14/2014   CLINICAL DATA:  Chest pain.  EXAM: PORTABLE CHEST - 1 VIEW  COMPARISON:  Chest x-ray 05/14/2013  FINDINGS: Prior CABG.  Cardiomegaly. Pulmonary vascularity is normal. Mild infiltrate medial right lung base. No pleural effusion. Stable left pleural thickening noted consistent scarring. Degenerative changes thoracic spine both shoulders.  IMPRESSION: 1. Mild infiltrate medial right lung base. 2. Cardiomegaly.  Prior CABG.  No pulmonary venous congestion.   Electronically Signed   By: Maisie Fus  Register   On: 10/14/2014 10:10    PHYSICAL EXAM General: NAD Neck: JVP 12-14 cm, no thyromegaly or thyroid nodule.  Lungs: Crackles at bases bilaterally. CV: Nondisplaced PMI.  Heart tachy, irregular S1/S2, no S3/S4, no murmur.  Trace ankle edema.  = Abdomen: Soft, nontender, no hepatosplenomegaly, no distention.  Neurologic: Alert and oriented x 3.  Psych: Anxious Extremities: No clubbing or cyanosis.   TELEMETRY: Reviewed telemetry pt in atrial fibrillation  ASSESSMENT AND PLAN: 1. Atrial fibrillation: Chronic, likely permanent. Rate driven higher probably by chest pain this morning.  Continue Toprol XL but will add amiodarone now for rate control. Holding warfarin for cath, INR 1.8 this morning and on heparin gtt.  2. Acute on chronic systolic CHF: Patient is volume overloaded on exam with JVD and lung crackles. Pulmonary edema on CXR.  Echo on 11/12 showed that EF is about 25% with wall motion abnormalities. This is down from the past. He diuresed reasonably on higher Lasix yesterday.  - Continue IV Lasix, hold dose prior to cath this morning.   - Hold ACEI with soft BP and elevated creatinine.  - Would avoid inotrope if possible given NSTEMI.  - LHC/RHC today.   3. ID: Possible PNA, fever earlier in stay. Mild WBC elevation. Procalcitonin is elevated.  I do not think this is the only process but may play a role. Influenza negative.  - Antibiotics per primary service 4. Elevated left hemidiaphragm: Chronic dyspnea. No history of smoking so doubt component of emphysema.  5. CAD: h/o CABG. NSTEMI this  admission with TnI 4.9, now trending down. EF lower compared to 1/14, so concern for true ACS with plaque rupture rather than demand ischemia. Has chronic LBBB.He is having ongoing active chest pain this morning.  INR now down to 1.8, creatinine stable at 1.5 but up from baseline.   - Heparin gtt now that INR < 2.  - Continue ASA, statin.  - LHC/RHC today, will try to get this done soon with active CP.  6. AKI: Creatinine up to 1.5. Given ongoing pain/NSTEMI, think that he will need cath this morning.  Will  need to minimize contrast as much as possible, no LV-gram.   Marca Ancona 10/16/2014 7:51 AM  LHC/RHC results reviewed, IABP in place.  HR now around 100.  He feels better, chest pain resolved.   Last PCWP 30.  CI 1.5 still. Will resend co-ox.  Start milrinone 0.125 and watch HR closely.  Will start Lasix gtt this afternoon as well.   Marca Ancona 10/16/2014 3:40 PM

## 2014-10-16 NOTE — Progress Notes (Signed)
Orthopedic Tech Progress Note Patient Details:  [name] 14-Nov-1941 [id number] Delivered knee immobilizer to pt.'s nurse. Patient ID: [name], male   DOB: Feb 15, 1941, 73 y.o.   MRN: [id number]   [name] 10/16/2014, 3:18 PM

## 2014-10-16 NOTE — Progress Notes (Addendum)
[facility] TEAM 1 - Stepdown/ICU TEAM Progress Note  [name]:[id number] DOB: 1941/03/12 DOA: 10/14/2014 PCP: Thayer Headings, MD  Admit HPI / Brief Narrative: 73 year old WM PMHx ischemic cardiomyopathy, known CAD involving bypass graft currently on medical therapy, mild systolic dysfunction with an EF of 40-45%, and diabetes who presented to the emergency department c/o shortness of breath ongoing for 4 days. He reported coughing without fever or other constitutional signs. This shortness of breath is different from his baseline which is caused by an issue with a paralyzed left hemidiaphragm; he is followed by Dr. Shelle Iron as an outpatient for this. Patient also has chronic exertional chest pain and only occasional resting chest pain. He also has chronic back pain due to spinal stenosis and is unable to lay flat in the bed.  Evaluation in the ER revealed mild leukocytosis with a white count of 12,000, chest x-ray demonstrated mild interstitial infiltrates in the right base. It was felt his presenting symptoms were likely related to community-acquired pneumonia and he was started on empiric antibiotic therapy. There was some concern he may have mild heart failure exacerbation related to the acute illness noting his proBNP was 2372 but this was not much higher than his apparent baseline which is between 2300 and 2400.  After admission patient developed atrial fibrillation with rapid ventricular response and was subsequently started on IV Cardizem drip. He later developed a rectal temp of 102.5 rectally. Subsequently his antibiotics were broadened.  By 11/2 patient continued to require high levels of oxygen at 6 L/m. His ventricular rate was better controlled in the 70s and the Cardizem was weaned and discontinued. Blood pressures remained soft in the 90 systolic range. His creatinine had increased. He was noted to have focal lower extremity edema around 2+ on the left lower extremity and 1+  on the right lower extremity. He had developed bilateral crackles on exam. Follow-up chest x-ray was consistent with pulmonary edema so Lasix was started and influenza PCR was checked.   HPI/Subjective: 11/3 patient on nonrebreather, A/O 4, states compared to yesterday feels improved.  Assessment/Plan:    NSTEMI/ Cardiomyopathy, ischemic,EF 40-45% 01/2013 per cath / Pulmonary edema -S/P right and left heart cardiac catheterization with placement of balloon pump; see below -managed per cardiology -strict in and out -Daily a.m. weight  Atrial fibrillation with RVR -continue amiodarone per cardiology -metoprolol IV 2.5 mg q 4 hr PRN  Essential hypertension -Blood pressure continue to be soft, even with balloon pump area -will follow cardiology lead in use of fluids secondary to patient's precarious cardiac function.  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 Stann Mainland with pulmonology as an outpatient    Code Status: FULL Family Communication: no family present at time of exam Disposition Plan: per cardiology    Consultants: Dr. Bryan Lemma (cardiology)   Procedure/Significant Events: 11/3 Right & Left Heart Catheterization with Native Coronary and Graft Angiography via RIGHT COMMON FEMORA Artery & Vein Access.INTRA-AORTIC BALLOON PUMP PLACEMENT - for decompensated Combined CHF    Culture   Antibiotics: Cefepime 11/1>> Vancomycin 11/2>>  DVT prophylaxis: Heparin  drip   Devices Intra-aortic balloon pump   LINES / TUBES:      Continuous Infusions: . sodium chloride 10 mL/hr at 10/16/14 1900  . amiodarone 30 mg/hr (10/16/14 1900)  . furosemide (LASIX) infusion 10 mg/hr (10/16/14 1900)  . heparin 1,100 Units/hr (10/16/14 2011)  . milrinone 0.125 mcg/kg/min (10/16/14 1900)    Objective: VITAL SIGNS: Temp: 97.9 F (36.6 C) (11/03 2200) Temp Source: Core (Comment) (11/03 1900) BP: 109/71 mmHg (11/03 2200) Pulse Rate: 40 (11/03 2200) SPO2; FIO2:   Intake/Output Summary (Last 24 hours) at 10/16/14 2236 Last data filed at 10/16/14 2200  Gross per 24 hour  Intake 4062.87 ml  Output   2245 ml  Net 1817.87 ml     Exam: General: A/O 4, moderate respiratory distress on a nonrebreather. Lungs: Clear to auscultation bilaterally(decreased breath sounds LLL), without wheezes or crackles Cardiovascular: Regular rate and rhythm(mechanical sound consistent with internal aortic balloon pump)  Abdomen: Nontender, nondistended, soft, bowel sounds positive, no rebound, no ascites, no appreciable mass Extremities: No significant cyanosis, clubbing, or edema bilateral lower extremities, right groin catheter present negative sign of bleeding or infection.  Data Reviewed: Basic Metabolic Panel:  Recent Labs Lab 10/14/14 0944 10/14/14 1340 10/15/14 0237 10/16/14 0053  NA 137  --  130* 131*  K 3.8  --  4.1 3.9  CL 96  --  91* 91*  CO2 25  --  24 24  GLUCOSE 202*  --  291* 195*  BUN 19  --  22 25*  CREATININE 1.10 1.15 1.47* 1.50*  CALCIUM 9.8  --  8.9 8.9   Liver Function Tests:  Recent Labs Lab 10/14/14 0944  AST 22  ALT 46  ALKPHOS 85  BILITOT 0.9  PROT 6.6  ALBUMIN 3.3*   No results for input(s): LIPASE, AMYLASE in the last 168 hours. No results for input(s): AMMONIA in the last 168 hours. CBC:  Recent Labs Lab 10/14/14 0944 10/14/14 1340 10/15/14 0237 10/16/14 0053  WBC 12.7* 10.6* 12.1* 13.0*  NEUTROABS 10.6*  --    --   --   HGB 15.3 14.6 14.4 14.3  HCT 45.6 43.3 43.5 42.9  MCV 95.4 95.6 96.2 94.3  PLT 145* 143* 170 146*   Cardiac Enzymes:  Recent Labs Lab 10/15/14 1332 10/15/14 1947 10/16/14 0053  TROPONINI 4.91* 4.44* 2.95*   BNP (last 3 results)  Recent Labs  10/14/14 0921 10/15/14 0237 10/16/14 0053  PROBNP 2372.0* 9181.0* 15184.0*   CBG:  Recent Labs Lab 10/15/14 2154 10/16/14 0803 10/16/14 1224 10/16/14 1628 10/16/14 2121  GLUCAP 282* 178* 209* 201* 232*    Recent Results (from the past 240 hour(s))  Culture, blood (routine x 2) Call MD if unable to obtain prior to antibiotics being given     Status: None (Preliminary result)   Collection Time: 10/14/14  1:25 PM  Result Value Ref Range Status   Specimen Description BLOOD RIGHT HAND  Final   Special Requests BOTTLES DRAWN AEROBIC AND ANAEROBIC 10CC  Final   Culture  Setup Time   Final    10/15/2014 02:25 Performed at [facility]    Culture   Final           BLOOD CULTURE RECEIVED NO GROWTH TO DATE CULTURE WILL BE HELD FOR 5 DAYS BEFORE ISSUING A FINAL NEGATIVE REPORT Performed at [facility]    Report Status PENDING  Incomplete  Culture, blood (routine x 2) Call MD if unable to obtain prior  to antibiotics being given     Status: None (Preliminary result)   Collection Time: 10/14/14  1:29 PM  Result Value Ref Range Status   Specimen Description BLOOD LEFT HAND  Final   Special Requests BOTTLES DRAWN AEROBIC AND ANAEROBIC 10CC  Final   Culture  Setup Time   Final    10/15/2014 02:25 Performed at [facility]    Culture   Final           BLOOD CULTURE RECEIVED NO GROWTH TO DATE CULTURE WILL BE HELD FOR 5 DAYS BEFORE ISSUING A FINAL NEGATIVE REPORT Performed at [facility]    Report Status PENDING  Incomplete  MRSA PCR Screening     Status: None   Collection Time: 10/14/14  6:37 PM  Result Value Ref Range Status   MRSA by PCR NEGATIVE NEGATIVE Final    Comment:        The  GeneXpert MRSA Assay (FDA approved for NASAL specimens only), is one component of a comprehensive MRSA colonization surveillance program. It is not intended to diagnose MRSA infection nor to guide or monitor treatment for MRSA infections.      Studies:  Recent x-ray studies have been reviewed in detail by the Attending Physician  Scheduled Meds:  Scheduled Meds: . aspirin EC  81 mg Oral Daily  . ceFEPime (MAXIPIME) IV  1 g Intravenous 3 times per day  . fluticasone  2 spray Each Nare Daily  . insulin aspart  0-20 Units Subcutaneous TID WC  . insulin aspart  0-5 Units Subcutaneous QHS  . insulin aspart  3 Units Subcutaneous TID WC  . insulin glargine  32 Units Subcutaneous Daily  . levothyroxine  175 mcg Oral Daily  . linagliptin  5 mg Oral Daily  . pantoprazole  40 mg Oral BID  . predniSONE  10 mg Oral Q breakfast  . rosuvastatin  20 mg Oral Daily  . sodium chloride  3 mL Intravenous Q12H  . vancomycin  750 mg Intravenous Q12H    Time spent on care of this patient: 40 mins   Drema Dallas , MD   [facility] Office  [phone] Pager - [phone]  On-Call/Text Page:      [email or web address]      password [email or web address]  If 7PM-7AM, please contact night-coverage [email or web address] Password [email or web address] 10/16/2014, 10:36 PM   LOS: 2 days

## 2014-10-16 NOTE — Interval H&P Note (Signed)
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 The various methods of treatment have been discussed with the patient and family. After consideration of risks, benefits and other options for treatment, the patient has consented to  Procedure(s): LEFT AND RIGHT HEART CATHETERIZATION WITH CORONARY/GRAFT ANGIOGRAM (N/A) +/- PCI as a surgical intervention .  The patient's history has been reviewed, patient examined, no change in status, stable for surgery.  I have reviewed the patient's chart and labs.  Questions were answered to the patient's satisfaction.    The procedure with Risks/Benefits/Alternatives and Indications was reviewed with the patient & family.  All questions were answered.    Risks / Complications include, but not limited to: Death, MI, CVA/TIA, VF/VT (with defibrillation), Bradycardia (need for temporary pacer placement), contrast induced nephropathy -- with mild renal insufficiency, this risk is higher (may need staged procedure), bleeding / bruising / hematoma / pseudoaneurysm, vascular or coronary injury (with possible emergent CT or Vascular Surgery), adverse medication reactions, infection.  Additional risks involving the use of radiation with the possibility of radiation burns and cancer were explained in detail.  The patient and family voice understanding and agree to proceed.    Cath Lab Visit (complete for each Cath Lab visit)  Clinical Evaluation Leading to the Procedure:   ACS: Yes.    Non-ACS:    Anginal Classification: CCS IV  Anti-ischemic medical therapy: Maximal Therapy (2 or more classes of medications)  Non-Invasive Test Results: No non-invasive testing performed  Prior CABG: Previous CABG  [name],[name]

## 2014-10-16 NOTE — Progress Notes (Signed)
Left radial TR band removed, pulse +2 and site level 0.  Patient given instructions on arm movement restrictions.  Will continue to monitor. [place], Mitzi Hansen

## 2014-10-17 DIAGNOSIS — R570 Cardiogenic shock: Secondary | ICD-10-CM

## 2014-10-17 LAB — HEPARIN LEVEL (UNFRACTIONATED)
HEPARIN UNFRACTIONATED: 0.28 [IU]/mL — AB (ref 0.30–0.70)
Heparin Unfractionated: <0.1 IU/mL — ABNORMAL LOW (ref 0.30–0.70)

## 2014-10-17 LAB — CARBOXYHEMOGLOBIN
Carboxyhemoglobin: 0.8 % (ref 0.5–1.5)
METHEMOGLOBIN: 0.9 % (ref 0.0–1.5)
O2 SAT: 61.2 %
TOTAL HEMOGLOBIN: 14.7 g/dL (ref 13.5–18.0)

## 2014-10-17 LAB — BASIC METABOLIC PANEL
ANION GAP: 12 (ref 5–15)
Anion gap: 11 (ref 5–15)
BUN: 30 mg/dL — ABNORMAL HIGH (ref 6–23)
BUN: 29 mg/dL — ABNORMAL HIGH (ref 6–23)
CALCIUM: 8.3 mg/dL — AB (ref 8.4–10.5)
CHLORIDE: 95 meq/L — AB (ref 96–112)
CO2: 29 meq/L (ref 19–32)
CO2: 28 meq/L (ref 19–32)
Calcium: 8.5 mg/dL (ref 8.4–10.5)
Chloride: 89 mEq/L — ABNORMAL LOW (ref 96–112)
Creatinine, Ser: 1.51 mg/dL — ABNORMAL HIGH (ref 0.50–1.35)
Creatinine, Ser: 1.52 mg/dL — ABNORMAL HIGH (ref 0.50–1.35)
GFR calc Af Amer: 50 mL/min — ABNORMAL LOW (ref >90)
GFR calc Af Amer: 50 mL/min — ABNORMAL LOW (ref >90)
GFR calc non Af Amer: 43 mL/min — ABNORMAL LOW (ref >90)
GFR calc non Af Amer: 43 mL/min — ABNORMAL LOW (ref >90)
GLUCOSE: 219 mg/dL — AB (ref 70–99)
GLUCOSE: 301 mg/dL — AB (ref 70–99)
Potassium: 3.6 mEq/L — ABNORMAL LOW (ref 3.7–5.3)
Potassium: 4.1 mEq/L (ref 3.7–5.3)
Sodium: 136 mEq/L — ABNORMAL LOW (ref 137–147)
Sodium: 128 mEq/L — ABNORMAL LOW (ref 137–147)

## 2014-10-17 LAB — GLUCOSE, CAPILLARY
GLUCOSE-CAPILLARY: 171 mg/dL — AB (ref 70–99)
GLUCOSE-CAPILLARY: 191 mg/dL — AB (ref 70–99)
Glucose-Capillary: 363 mg/dL — ABNORMAL HIGH (ref 70–99)
Glucose-Capillary: 280 mg/dL — ABNORMAL HIGH (ref 70–99)

## 2014-10-17 LAB — CBC
HCT: 39.6 % (ref 39.0–52.0)
HEMOGLOBIN: 13.1 g/dL (ref 13.0–17.0)
MCH: 31.2 pg (ref 26.0–34.0)
MCHC: 33.1 g/dL (ref 30.0–36.0)
MCV: 94.3 fL (ref 78.0–100.0)
Platelets: 150 10*3/uL (ref 150–400)
RBC: 4.2 MIL/uL — AB (ref 4.22–5.81)
RDW: 15.3 % (ref 11.5–15.5)
WBC: 11.6 10*3/uL — AB (ref 4.0–10.5)

## 2014-10-17 LAB — PROCALCITONIN: PROCALCITONIN: 0.56 ng/mL

## 2014-10-17 LAB — MAGNESIUM: Magnesium: 1.9 mg/dL (ref 1.5–2.5)

## 2014-10-17 LAB — PROTIME-INR
INR: 1.73 — ABNORMAL HIGH (ref 0.00–1.49)
PROTHROMBIN TIME: 20.4 s — AB (ref 11.6–15.2)

## 2014-10-17 MED ORDER — CETYLPYRIDINIUM CHLORIDE 0.05 % MT LIQD
7.0000 mL | Freq: Two times a day (BID) | OROMUCOSAL | Status: DC
  Administered 2014-10-17 – 2014-10-23 (×13): 7 mL via OROMUCOSAL

## 2014-10-17 MED ORDER — POTASSIUM CHLORIDE CRYS ER 20 MEQ PO TBCR
40.0000 meq | EXTENDED_RELEASE_TABLET | Freq: Once | ORAL | Status: AC
Start: 2014-10-17 — End: 2014-10-17
  Administered 2014-10-17: 40 meq via ORAL
  Filled 2014-10-17: qty 2

## 2014-10-17 MED ORDER — SPIRONOLACTONE 12.5 MG HALF TABLET
12.5000 mg | ORAL_TABLET | Freq: Every day | ORAL | Status: DC
  Administered 2014-10-17: 12.5 mg via ORAL
  Filled 2014-10-17 (×2): qty 1

## 2014-10-17 NOTE — Progress Notes (Signed)
ANTICOAGULATION CONSULT NOTE  Pharmacy Consult for heparin Indication: atrial fibrillation/IABP  Allergies  Allergen Reactions  . Butrans [Buprenorphine] Shortness Of Breath  . Clarithromycin Other (See Comments)    Unknown, thinks "blisters in mouth"  . Influenza Vaccines Other (See Comments)    Severe coughing for weeks after vaccine; also had flu like symptoms for about 7 to 8 weeks.   . Lanoxin [Digoxin] Other (See Comments)    Nausea, anorexia  . Lisinopril Other (See Comments)    cough  . Ranexa [Ranolazine Er]   . Statins     Patient Measurements: Height: 6' (182.9 cm) Weight: 218 lb 11.1 oz (99.2 kg) IBW/kg (Calculated) : 77.6 Heparin Dosing Weight: 97kg  Vital Signs: Temp: 98.6 F (37 C) (11/04 0500) BP: 126/72 mmHg (11/04 0600) Pulse Rate: 91 (11/04 0600)  Labs:  Recent Labs  10/15/14 0237 10/15/14 1332 10/15/14 1947 10/16/14 0053 10/17/14 0345  HGB 14.4  --   --  14.3 13.1  HCT 43.5  --   --  42.9 39.6  PLT 170  --   --  146* 150  LABPROT  --   --  23.7* 21.1* 20.4*  INR  --   --  2.09* 1.81* 1.73*  HEPARINUNFRC  --   --   --   --  <0.10*  CREATININE 1.47*  --   --  1.50* 1.51*  TROPONINI  --  4.91* 4.44* 2.95*  --     Estimated Creatinine Clearance: 51.5 mL/min (by C-G formula based on Cr of 1.51).  Assessment: 73 yo male with with h/o Afib, Coumadin on hold,  s/p cath and on IABP, for heparin  Goal of Therapy:  Heparin level 0.3-0.7 units/ml Monitor platelets by anticoagulation protocol: Yes   Plan:  Increase Heparin 1450 units/hr Check heparin level in 8 hours.  Geannie Risen, PharmD, BCPS 10/17/2014 7:06 AM

## 2014-10-17 NOTE — Progress Notes (Signed)
11:38 PM     73 yo with HL after increase to 18 units/kg /hr 8 hours ago of 0.28 IU/ml. Will continue at 1800 units/hr and recheck with am labs at 0500. Will most likely accumutate to therapeutic range of >0.30 IU/ml by that time with respect to age and renal function. If still below goal will increase rate at that time. No bleeding or infusion complications noted at this time.   Janice Coffin

## 2014-10-17 NOTE — Progress Notes (Signed)
Balloon pump continues to alarm "IAB Catheter Restriction", despite interventions. Checked catheter tubing and extender tubing for any signs of restriction. Catheter able to flush and blood return noted after aspiration from central lumen. Also attempted to reposition patient. Dr. Adolm Joseph made aware, and now at bedside to see patient. STAT chest xray ordered to check placement. Will continue to monitor.

## 2014-10-17 NOTE — Progress Notes (Signed)
ANTICOAGULATION CONSULT NOTE - Follow Up Consult  Pharmacy Consult for Heparin Indication: IABP  Allergies  Allergen Reactions  . Butrans [Buprenorphine] Shortness Of Breath  . Clarithromycin Other (See Comments)    Unknown, thinks "blisters in mouth"  . Influenza Vaccines Other (See Comments)    Severe coughing for weeks after vaccine; also had flu like symptoms for about 7 to 8 weeks.   . Lanoxin [Digoxin] Other (See Comments)    Nausea, anorexia  . Lisinopril Other (See Comments)    cough  . Ranexa [Ranolazine Er]   . Statins     Patient Measurements: Height: 6' (182.9 cm) Weight: 218 lb 11.1 oz (99.2 kg) IBW/kg (Calculated) : 77.6   Vital Signs: Temp: 98.6 F (37 C) (11/04 1500) BP: 116/83 mmHg (11/04 1500) Pulse Rate: 25 (11/04 1500)  Labs:  Recent Labs  10/15/14 0237 10/15/14 1332 10/15/14 1947 10/16/14 0053 10/17/14 0345 10/17/14 1400 10/17/14 1415  HGB 14.4  --   --  14.3 13.1  --   --   HCT 43.5  --   --  42.9 39.6  --   --   PLT 170  --   --  146* 150  --   --   LABPROT  --   --  23.7* 21.1* 20.4*  --   --   INR  --   --  2.09* 1.81* 1.73*  --   --   HEPARINUNFRC  --   --   --   --  <0.10* <0.10*  --   CREATININE 1.47*  --   --  1.50* 1.51*  --  1.52*  TROPONINI  --  4.91* 4.44* 2.95*  --   --   --     Estimated Creatinine Clearance: 51.2 mL/min (by C-G formula based on Cr of 1.52).     Assessment: 73yom admitted for CP and started on heparin drip.  Went to cath lab found to have patent grafts except for SVG to PDA.  No intervention at that time.  Also found to have low CO and IABP was placed.  Heparin to be restarted for [facility] with IABP.    Goal of Therapy:  Heparin level 0.2-0.5 units/ml Monitor platelets by anticoagulation protocol: Yes   Plan:  Restart Heparin drip 1100 uts/hr at 2000 11/3 Daily HL, CBC  Leota Sauers Pharm.D. CPP, BCPS Clinical Pharmacist [phone] 10/17/2014 3:42 PM

## 2014-10-17 NOTE — Progress Notes (Signed)
ANTICOAGULATION CONSULT NOTE - Follow Up Consult  Pharmacy Consult for Heparin Indication: atrial fibrillation / IABP  Allergies  Allergen Reactions  . Butrans [Buprenorphine] Shortness Of Breath  . Clarithromycin Other (See Comments)    Unknown, thinks "blisters in mouth"  . Influenza Vaccines Other (See Comments)    Severe coughing for weeks after vaccine; also had flu like symptoms for about 7 to 8 weeks.   . Lanoxin [Digoxin] Other (See Comments)    Nausea, anorexia  . Lisinopril Other (See Comments)    cough  . Ranexa [Ranolazine Er]   . Statins     Patient Measurements: Height: 6' (182.9 cm) Weight: 218 lb 11.1 oz (99.2 kg) IBW/kg (Calculated) : 77.6 Heparin Dosing Weight: 97 kg  Vital Signs: Temp: 98.6 F (37 C) (11/04 1500) BP: 116/83 mmHg (11/04 1500) Pulse Rate: 25 (11/04 1500)  Labs:  Recent Labs  10/15/14 0237 10/15/14 1332 10/15/14 1947 10/16/14 0053 10/17/14 0345 10/17/14 1400 10/17/14 1415  HGB 14.4  --   --  14.3 13.1  --   --   HCT 43.5  --   --  42.9 39.6  --   --   PLT 170  --   --  146* 150  --   --   LABPROT  --   --  23.7* 21.1* 20.4*  --   --   INR  --   --  2.09* 1.81* 1.73*  --   --   HEPARINUNFRC  --   --   --   --  <0.10* <0.10*  --   CREATININE 1.47*  --   --  1.50* 1.51*  --  1.52*  TROPONINI  --  4.91* 4.44* 2.95*  --   --   --     Estimated Creatinine Clearance: 51.2 mL/min (by C-G formula based on Cr of 1.52).  Assessment: 73 yo M with hx of Afib, s/p cath and on IABP. On coumadin at home but currently on hold. HL remains < 0.1 after last increase. Hgb stable at 13.1, Plts 150. No s/s of bleed. Currently on amio gtt for rate control and milrinone gtt.  Goal of Therapy:  Heparin level 0.2-0.5 Monitor platelets by anticoagulation protocol: Yes   Plan:  Increase heparin gtt to 1,800 units/hr with NO bolus Recheck HL in 8 hours at 2300 Monitor daily HL, CBC, s/s of bleed  Karon Cotterill J 10/17/2014,3:21 PM

## 2014-10-17 NOTE — Progress Notes (Signed)
[place] TEAM 1 - Stepdown/ICU TEAM Progress Note  [name] XFG:[id number] DOB: [birth year] DOA: 10/14/2014 PCP: Thayer Headings, MD  Admit HPI / Brief Narrative: 73 year old WM PMHx ischemic cardiomyopathy, known CAD involving bypass graft currently on medical therapy, mild systolic dysfunction with an EF of 40-45%, and diabetes who presented to the emergency department c/o shortness of breath ongoing for 4 days. He reported coughing without fever or other constitutional signs. This shortness of breath is different from his baseline which is caused by an issue with a paralyzed left hemidiaphragm; he is followed by Dr. Shelle Iron as an outpatient for this. Patient also has chronic exertional chest pain and only occasional resting chest pain. He also has chronic back pain due to spinal stenosis and is unable to lay flat in the bed.  Evaluation in the ER revealed mild leukocytosis with a white count of 12,000, chest x-ray demonstrated mild interstitial infiltrates in the right base. It was felt his presenting symptoms were likely related to community-acquired pneumonia and he was started on empiric antibiotic therapy. There was some concern he may have mild heart failure exacerbation related to the acute illness noting his proBNP was 2372 but this was not much higher than his apparent baseline which is between 2300 and 2400.  After admission patient developed atrial fibrillation with rapid ventricular response and was subsequently started on IV Cardizem drip. He later developed a rectal temp of 102.5 rectally. Subsequently his antibiotics were broadened.  By 11/2 patient continued to require high levels of oxygen at 6 L/m. His ventricular rate was better controlled in the 70s and the Cardizem was weaned and discontinued. Blood pressures remained soft in the 90 systolic range. His creatinine had increased. He was noted to have focal lower extremity edema around 2+ on the left lower extremity and 1+  on the right lower extremity. He had developed bilateral crackles on exam. Follow-up chest x-ray was consistent with pulmonary edema so Lasix was started and influenza PCR was checked.  On 11/3 patient became unstable from a cardiac standpoint as evidenced by persistent A. Fib with RVR and chest pain. He was taken to the Cath Lab where a IABP was placed; Swan-Ganz catheter was also placed and the patient was subsequently sent to the ICU. He was started on IV amiodarone for rate control and a milrinone infusion to help with his cardiogenic shock. He was also initiated on a Lasix infusion. Catheterization did reveal a likely culprit lesion being occlusion of the SVG-PDA which cardiology feels may have been subacute given the fact he has collaterals. Again no good target lesion was identified for intervention.  HPI/Subjective: Patient very alert and states breathing much better then 48 hours prior. Actually making jokes. Denies chest pain.  Assessment/Plan:   CAD of artery bypass graft/ NSTEMI/ Cardiomyopathy, ischemic,EF 40-45% 01/2013 per cath / Pulmonary edema -S/P right and left heart cardiac catheterization with placement of balloon pump; see below-managed per cardiology-continues to require milrinone and Lasix infusions to treat cardiogenic shock with heart failure- neg 3L-no target lesion for intervention identified but it is suspected that the SVG/PDA was culprit lesion-continue heparin infusion  Atrial fibrillation with RVR -continue amiodarone per cardiology-metoprolol IV 2.5 mg q 4 hr PRN  Essential hypertension -Blood pressure soft but remains greater than 90  Chronic anticoagulation -Continue heparin drip-transition back to warfarin once balloon pump out  Fever/CAP vs influenza -continue empiric antibiotics-influenza PCR negative  Acute respiratory failure with hypoxia:  --multifactorial due to decompensated ischemic CHF and  suspected CAP  Diabetes mellitus Cont Lantus 37  units -continue resistant SSI-CBG slightly higher today but it was discovered patient was not on a carbohydrate modified diet- hemoglobin A1c 8.6   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 Shelle Iron with pulmonology as an outpatient  Code Status: FULL Family Communication: wife at bedside Disposition Plan: remain in ICU until stable from a cardiogenic shock standpoint  Consultants: Dr. Bryan Lemma (cardiology)  Procedure/Significant Events: 11/3 Right & Left Heart Catheterization with Native Coronary and Graft Angiography via RIGHT COMMON FEMORA Artery & Vein Access.INTRA-AORTIC BALLOON PUMP PLACEMENT - for decompensated Combined CHF  11/2 2-D echocardiogram:- Left ventricle: There was moderate concentric hypertrophy. Systolic function was mildly reduced. The estimated ejection fraction was in the range of 45% to 50%. Hypokinesis of the lateral and inferolateral myocardium. Atrial arrhythmia prevents adequate evaluation of LV diastolic function. - Ventricular septum: Septal motion showed paradox. - Aortic valve: Mild regurgitation. - Left atrium: The atrium was mildly dilated. - Right atrium: The atrium was mildly dilated.   Antibiotics: Cefepime 11/1> Vancomycin 11/1>11/4  DVT prophylaxis: Heparin drip  Objective: Blood pressure 109/75, pulse 199, temperature 98.6 F (37 C), temperature source Core (Comment), resp. rate 29, height 6' (1.829 m), weight 99.2 kg (218 lb 11.1 oz), SpO2 96 %.  Intake/Output Summary (Last 24 hours) at 10/17/14 1253 Last data filed at 10/17/14 1200  Gross per 24 hour  Intake 2837.5 ml  Output   3925 ml  Net -1087.5 ml   Exam: General: no respiratory distress, alert Lungs: Clear to auscultation bilaterally (decreased breath sounds LLL), without wheezes or crackles Cardiovascular: Regular rate and rhythm ,no rubs murmurs thrills or gallops, trace  peripheral edema left lower extremity-pulses easily palpable distal to IABP site Abdomen: Nontender, nondistended, soft, bowel sounds positive, no rebound, no ascites, no appreciable mass Extremities: No significant cyanosis, clubbing of bilateral lower extremities  Data Reviewed: Basic Metabolic Panel:  Recent Labs Lab 10/14/14 0944 10/14/14 1340 10/15/14 0237 10/16/14 0053 10/17/14 0345  NA 137  --  130* 131* 136*  K 3.8  --  4.1 3.9 3.6*  CL 96  --  91* 91* 95*  CO2 25  --  24 24 29   GLUCOSE 202*  --  291* 195* 219*  BUN 19  --  22 25* 30*  CREATININE 1.10 1.15 1.47* 1.50* 1.51*  CALCIUM 9.8  --  8.9 8.9 8.5  MG  --   --   --   --  1.9   Liver Function Tests:  Recent Labs Lab 10/14/14 0944  AST 22  ALT 46  ALKPHOS 85  BILITOT 0.9  PROT 6.6  ALBUMIN 3.3*   CBC:  Recent Labs Lab 10/14/14 0944 10/14/14 1340 10/15/14 0237 10/16/14 0053 10/17/14 0345  WBC 12.7* 10.6* 12.1* 13.0* 11.6*  NEUTROABS 10.6*  --   --   --   --   HGB 15.3 14.6 14.4 14.3 13.1  HCT 45.6 43.3 43.5 42.9 39.6  MCV 95.4 95.6 96.2 94.3 94.3  PLT 145* 143* 170 146* 150   Cardiac Enzymes:  Recent Labs Lab 10/15/14 1332 10/15/14 1947 10/16/14 0053  TROPONINI 4.91* 4.44* 2.95*    CBG:  Recent Labs Lab 10/16/14 1224 10/16/14 1628 10/16/14 2121 10/17/14 0741 10/17/14 1143  GLUCAP 209* 201* 232* 171* 363*    Recent Results (from the past 240 hour(s))  Culture, blood (routine x 2) Call MD if unable  to obtain prior to antibiotics being given     Status: None (Preliminary result)   Collection Time: 10/14/14  1:25 PM  Result Value Ref Range Status   Specimen Description BLOOD RIGHT HAND  Final   Special Requests BOTTLES DRAWN AEROBIC AND ANAEROBIC 10CC  Final   Culture  Setup Time   Final    10/15/2014 02:25 Performed at [facility]    Culture   Final           BLOOD CULTURE RECEIVED NO GROWTH TO DATE CULTURE WILL BE HELD FOR 5 DAYS BEFORE ISSUING A FINAL NEGATIVE  REPORT Performed at [facility]    Report Status PENDING  Incomplete  Culture, blood (routine x 2) Call MD if unable to obtain prior to antibiotics being given     Status: None (Preliminary result)   Collection Time: 10/14/14  1:29 PM  Result Value Ref Range Status   Specimen Description BLOOD LEFT HAND  Final   Special Requests BOTTLES DRAWN AEROBIC AND ANAEROBIC 10CC  Final   Culture  Setup Time   Final    10/15/2014 02:25 Performed at [facility]    Culture   Final           BLOOD CULTURE RECEIVED NO GROWTH TO DATE CULTURE WILL BE HELD FOR 5 DAYS BEFORE ISSUING A FINAL NEGATIVE REPORT Performed at [facility]    Report Status PENDING  Incomplete  MRSA PCR Screening     Status: None   Collection Time: 10/14/14  6:37 PM  Result Value Ref Range Status   MRSA by PCR NEGATIVE NEGATIVE Final    Comment:        The GeneXpert MRSA Assay (FDA approved for NASAL specimens only), is one component of a comprehensive MRSA colonization surveillance program. It is not intended to diagnose MRSA infection nor to guide or monitor treatment for MRSA infections.      Studies:  Recent x-ray studies have been reviewed in detail by the Attending Physician  Scheduled Meds:  Scheduled Meds: . antiseptic oral rinse  7 mL Mouth Rinse BID  . aspirin EC  81 mg Oral Daily  . ceFEPime (MAXIPIME) IV  1 g Intravenous 3 times per day  . fluticasone  2 spray Each Nare Daily  . insulin aspart  0-20 Units Subcutaneous TID WC  . insulin aspart  0-5 Units Subcutaneous QHS  . insulin aspart  3 Units Subcutaneous TID WC  . insulin glargine  37 Units Subcutaneous Daily  . levothyroxine  175 mcg Oral Daily  . linagliptin  5 mg Oral Daily  . pantoprazole  40 mg Oral BID  . predniSONE  10 mg Oral Q breakfast  . rosuvastatin  20 mg Oral Daily  . sodium chloride  3 mL Intravenous Q12H  . spironolactone  12.5 mg Oral Daily  . vancomycin  750 mg Intravenous Q12H    Time spent  on care of this patient: 25 mins   ELLIS,ALLISON L. , ANP  [facility] Office  [phone] Pager - [phone]  On-Call/Text Page:      [email or web address]      password [email or web address]  If 7PM-7AM, please contact night-coverage [email or web address] Password [email or web address] 10/17/2014, 12:53 PM   LOS: 3 days   I have personally examined this patient and reviewed the entire database. I have reviewed the above note, made any necessary editorial changes, and agree with its content.  Lonia Blood, MD [facility]

## 2014-10-17 NOTE — Progress Notes (Signed)
Patient ID: [name], male   DOB: 1941-05-02, 73 y.o.   MRN: [id number]   SUBJECTIVE: Currently, patient is stable.  Good UOP yesterday, breathing better today.  Remains in atrial fibrillation 100s-110s on amiodarone gtt.  Now has milrinone gtt running and IABP in place augmenting appropriately.   LHC/RHC (11/3): Patent LIMA-LAD and SVG-OM TO SVG-PDA (likely culprit) with TO PDA and 60% proximal RCA.  There were some left=>PDA collaterals PCWP 35  Swan numbers today: CVP 10 PA 45/23 PCWP 21 CI 1.7  Co-ox 61%  Scheduled Meds: . antiseptic oral rinse  7 mL Mouth Rinse BID  . aspirin EC  81 mg Oral Daily  . ceFEPime (MAXIPIME) IV  1 g Intravenous 3 times per day  . fluticasone  2 spray Each Nare Daily  . insulin aspart  0-20 Units Subcutaneous TID WC  . insulin aspart  0-5 Units Subcutaneous QHS  . insulin aspart  3 Units Subcutaneous TID WC  . insulin glargine  37 Units Subcutaneous Daily  . levothyroxine  175 mcg Oral Daily  . linagliptin  5 mg Oral Daily  . pantoprazole  40 mg Oral BID  . predniSONE  10 mg Oral Q breakfast  . rosuvastatin  20 mg Oral Daily  . sodium chloride  3 mL Intravenous Q12H  . spironolactone  12.5 mg Oral Daily  . vancomycin  750 mg Intravenous Q12H   Continuous Infusions: . sodium chloride 10 mL/hr at 10/16/14 1900  . amiodarone 30 mg/hr (10/17/14 0128)  . furosemide (LASIX) infusion 10 mg/hr (10/16/14 1900)  . heparin 1,450 Units/hr (10/17/14 0709)  . milrinone 0.125 mcg/kg/min (10/16/14 1900)   PRN Meds:.sodium chloride, acetaminophen **OR** acetaminophen, HYDROcodone-acetaminophen, metoprolol, nitroGLYCERIN, ondansetron **OR** ondansetron (ZOFRAN) IV, polyethylene glycol, sodium chloride    Filed Vitals:   10/17/14 0400 10/17/14 0500 10/17/14 0600 10/17/14 0700  BP: 121/84 124/79 126/72 113/75  Pulse: 45 46 91 58  Temp: 98.4 F (36.9 C) 98.6 F (37 C) 98.8 F (37.1 C) 98.8 F (37.1 C)  TempSrc:      Resp: 19 25 24 25   Height:       Weight:      SpO2: 97% 97% 97% 97%    Intake/Output Summary (Last 24 hours) at 10/17/14 0733 Last data filed at 10/17/14 0700  Gross per 24 hour  Intake   2133 ml  Output   3130 ml  Net   -997 ml    LABS: Basic Metabolic Panel:  Recent Labs  2134 0053 10/17/14 0345  NA 131* 136*  K 3.9 3.6*  CL 91* 95*  CO2 24 29  GLUCOSE 195* 219*  BUN 25* 30*  CREATININE 1.50* 1.51*  CALCIUM 8.9 8.5  MG  --  1.9   Liver Function Tests:  Recent Labs  10/14/14 0944  AST 22  ALT 46  ALKPHOS 85  BILITOT 0.9  PROT 6.6  ALBUMIN 3.3*   No results for input(s): LIPASE, AMYLASE in the last 72 hours. CBC:  Recent Labs  10/14/14 0944  10/16/14 0053 10/17/14 0345  WBC 12.7*  < > 13.0* 11.6*  NEUTROABS 10.6*  --   --   --   HGB 15.3  < > 14.3 13.1  HCT 45.6  < > 42.9 39.6  MCV 95.4  < > 94.3 94.3  PLT 145*  < > 146* 150  < > = values in this interval not displayed. Cardiac Enzymes:  Recent Labs  10/15/14 1332 10/15/14 1947 10/16/14 0053  TROPONINI 4.91* 4.44* 2.95*   BNP: Invalid input(s): POCBNP D-Dimer: No results for input(s): DDIMER in the last 72 hours. Hemoglobin A1C:  Recent Labs  10/14/14 1340  HGBA1C 8.6*   Fasting Lipid Panel: No results for input(s): CHOL, HDL, LDLCALC, TRIG, CHOLHDL, LDLDIRECT in the last 72 hours. Thyroid Function Tests: No results for input(s): TSH, T4TOTAL, T3FREE, THYROIDAB in the last 72 hours.  Invalid input(s): FREET3 Anemia Panel: No results for input(s): VITAMINB12, FOLATE, FERRITIN, TIBC, IRON, RETICCTPCT in the last 72 hours.  RADIOLOGY: Dg Chest Port 1 View  10/17/2014   CLINICAL DATA:  Placement of the intra-aortic balloon pump a cyst.  EXAM: PORTABLE CHEST - 1 VIEW  COMPARISON:  10/15/2014  FINDINGS: Postoperative changes in the mediastinum. Interval placement of central venous catheter via in superior approach with tip over the left pulmonary outflow tract. No pneumothorax. Cardiac enlargement with  pulmonary vascular congestion and perihilar infiltrates suggesting edema. Small bilateral pleural effusions with basilar atelectasis. Radiopaque EIA BP tip localized over the upper descending thoracic aorta.  IMPRESSION: Appliances appear to be in satisfactory location. Cardiac enlargement with pulmonary vascular congestion, perihilar edema, and small bilateral pleural effusions.   Electronically Signed   By: Burman Nieves M.D.   On: 10/17/2014 00:09   Dg Chest Port 1 View  10/15/2014   CLINICAL DATA:  Hypoxia  EXAM: PORTABLE CHEST - 1 VIEW  COMPARISON:  Yesterday  FINDINGS: Vascular congestion and diffuse edema have developed. Upper normal heart size. No pneumothorax. Small pleural effusions are now present.  IMPRESSION: New edema and small pleural effusions.   Electronically Signed   By: Maryclare Bean M.D.   On: 10/15/2014 09:06   Dg Chest Port 1 View  10/14/2014   CLINICAL DATA:  Chest pain.  EXAM: PORTABLE CHEST - 1 VIEW  COMPARISON:  Chest x-ray 05/14/2013  FINDINGS: Prior CABG. Cardiomegaly. Pulmonary vascularity is normal. Mild infiltrate medial right lung base. No pleural effusion. Stable left pleural thickening noted consistent scarring. Degenerative changes thoracic spine both shoulders.  IMPRESSION: 1. Mild infiltrate medial right lung base. 2. Cardiomegaly.  Prior CABG.  No pulmonary venous congestion.   Electronically Signed   By: Maisie Fus  Register   On: 10/14/2014 10:10    PHYSICAL EXAM General: NAD Neck: JVP 12 cm, no thyromegaly or thyroid nodule.  Lungs: Crackles at bases bilaterally. CV: Nondisplaced PMI.  Heart tachy, irregular S1/S2, no S3/S4, no murmur.  Trace ankle edema.  IABP sounds. Abdomen: Soft, nontender, no hepatosplenomegaly, no distention.  Neurologic: Alert and oriented x 3.  Psych: Anxious Extremities: No clubbing or cyanosis.   TELEMETRY: Reviewed telemetry pt in atrial fibrillation  ASSESSMENT AND PLAN: 1. Atrial fibrillation: Chronic, likely permanent. Rate  control with amiodarone gtt, on heparin gtt with IABP. 2. Acute on chronic systolic CHF with cardiogenic shock:  Echo on 11/2 showed that EF is about 25% with wall motion abnormalities. This is down from the past. IABP placed yesterday with cardiogenic shock and milrinone begun with persistently low co-ox.  He had very high filling pressures at RHC.  He diuresed reasonably well yesterday with Lasix gtt and breathing is better, PCWP lower.  CI still low at 1.7 but co-ox better at 61%.   - Continue current milrinone, will not increase with atrial fibrillation/RVR.  - IABP to remain in place today, heparin gtt running, CBC is stable.  Stable IABP position on CXR this morning.  - Lasix gtt at 12 mg/hr, will add spironolactone 12.5 mg daily.    -  Holding ACEI with soft BP and elevated creatinine.  3. ID: Possible PNA, fever earlier in stay. Mild WBC elevation. Procalcitonin is elevated.  I do not think this is the only process but may play a role. Influenza negative.  - Antibiotics per primary service (cefepime/vancomycin).  4. Elevated left hemidiaphragm: Chronic dyspnea. No history of smoking so doubt component of emphysema.  5. CAD: h/o CABG. NSTEMI this admission with TnI 4.9, now trending down. EF lower compared to 1/14, so concern for true ACS with plaque rupture rather than demand ischemia. Has chronic LBBB.No further chest pain.  Angiography as above showed that culprit was likely occlusion of SVG-PDA though this may have been subacute given some collaterals.  No good target for intervention. - Continue ASA, statin.  6. AKI: Creatinine stable.  Follow closely, got contrast yesterday.    Marca Ancona 10/17/2014 7:33 AM

## 2014-10-17 NOTE — Progress Notes (Signed)
Pharmacist Heart Failure Core Measure Documentation  Assessment: [name] has an EF documented as 25% on 10/15/14 by ECHO.  Rationale: Heart failure patients with left ventricular systolic dysfunction (LVSD) and an EF < 40% should be prescribed an angiotensin converting enzyme inhibitor (ACEI) or angiotensin receptor blocker (ARB) at discharge unless a contraindication is documented in the medical record.  This patient is not currently on an ACEI or ARB for HF.  This note is being placed in the record in order to provide documentation that a contraindication to the use of these agents is present for this encounter.  ACE Inhibitor or Angiotensin Receptor Blocker is contraindicated (specify all that apply)  []   ACEI allergy AND ARB allergy []   Angioedema []   Moderate or severe aortic stenosis []   Hyperkalemia []   Hypotension []   Renal artery stenosis [x]   Worsening renal function, preexisting renal disease or dysfunction   Von 10/17/2014 11:31 AM

## 2014-10-18 ENCOUNTER — Inpatient Hospital Stay (HOSPITAL_COMMUNITY): Payer: Medicare Other

## 2014-10-18 DIAGNOSIS — I5023 Acute on chronic systolic (congestive) heart failure: Secondary | ICD-10-CM

## 2014-10-18 DIAGNOSIS — Z7901 Long term (current) use of anticoagulants: Secondary | ICD-10-CM

## 2014-10-18 DIAGNOSIS — E1165 Type 2 diabetes mellitus with hyperglycemia: Secondary | ICD-10-CM

## 2014-10-18 DIAGNOSIS — J986 Disorders of diaphragm: Secondary | ICD-10-CM

## 2014-10-18 LAB — CARBOXYHEMOGLOBIN
CARBOXYHEMOGLOBIN: 0.9 % (ref 0.5–1.5)
Carboxyhemoglobin: 1.2 % (ref 0.5–1.5)
METHEMOGLOBIN: 1.1 % (ref 0.0–1.5)
Methemoglobin: 0.9 % (ref 0.0–1.5)
O2 SAT: 68.6 %
O2 Saturation: 60.2 %
Total hemoglobin: 13.9 g/dL (ref 13.5–18.0)
Total hemoglobin: 13.3 g/dL — ABNORMAL LOW (ref 13.5–18.0)

## 2014-10-18 LAB — BASIC METABOLIC PANEL
Anion gap: 13 (ref 5–15)
BUN: 32 mg/dL — AB (ref 6–23)
CHLORIDE: 95 meq/L — AB (ref 96–112)
CO2: 30 mEq/L (ref 19–32)
Calcium: 8.7 mg/dL (ref 8.4–10.5)
Creatinine, Ser: 1.71 mg/dL — ABNORMAL HIGH (ref 0.50–1.35)
GFR calc Af Amer: 43 mL/min — ABNORMAL LOW (ref >90)
GFR calc non Af Amer: 37 mL/min — ABNORMAL LOW (ref >90)
Glucose, Bld: 168 mg/dL — ABNORMAL HIGH (ref 70–99)
Potassium: 3.7 mEq/L (ref 3.7–5.3)
Sodium: 138 mEq/L (ref 137–147)

## 2014-10-18 LAB — CBC
HCT: 40.3 % (ref 39.0–52.0)
Hemoglobin: 13.5 g/dL (ref 13.0–17.0)
MCH: 31.6 pg (ref 26.0–34.0)
MCHC: 33.5 g/dL (ref 30.0–36.0)
MCV: 94.4 fL (ref 78.0–100.0)
PLATELETS: 147 10*3/uL — AB (ref 150–400)
RBC: 4.27 MIL/uL (ref 4.22–5.81)
RDW: 15.2 % (ref 11.5–15.5)
WBC: 9.7 10*3/uL (ref 4.0–10.5)

## 2014-10-18 LAB — GLUCOSE, CAPILLARY
GLUCOSE-CAPILLARY: 288 mg/dL — AB (ref 70–99)
GLUCOSE-CAPILLARY: 292 mg/dL — AB (ref 70–99)
GLUCOSE-CAPILLARY: 187 mg/dL — AB (ref 70–99)
Glucose-Capillary: 159 mg/dL — ABNORMAL HIGH (ref 70–99)

## 2014-10-18 LAB — POCT ACTIVATED CLOTTING TIME
ACTIVATED CLOTTING TIME: 490 s
ACTIVATED CLOTTING TIME: 123 s

## 2014-10-18 LAB — HEPARIN LEVEL (UNFRACTIONATED): HEPARIN UNFRACTIONATED: 0.35 [IU]/mL (ref 0.30–0.70)

## 2014-10-18 LAB — PROTIME-INR
INR: 1.52 — ABNORMAL HIGH (ref 0.00–1.49)
Prothrombin Time: 18.4 seconds — ABNORMAL HIGH (ref 11.6–15.2)

## 2014-10-18 MED ORDER — POTASSIUM CHLORIDE CRYS ER 20 MEQ PO TBCR
40.0000 meq | EXTENDED_RELEASE_TABLET | Freq: Once | ORAL | Status: AC
  Administered 2014-10-18: 40 meq via ORAL
  Filled 2014-10-18: qty 2

## 2014-10-18 MED ORDER — WARFARIN SODIUM 10 MG PO TABS
10.0000 mg | ORAL_TABLET | Freq: Once | ORAL | Status: AC
  Administered 2014-10-18: 10 mg via ORAL
  Filled 2014-10-18: qty 1

## 2014-10-18 MED ORDER — WARFARIN - PHARMACIST DOSING INPATIENT: Freq: Every day | Status: DC

## 2014-10-18 MED ORDER — SPIRONOLACTONE 25 MG PO TABS
25.0000 mg | ORAL_TABLET | Freq: Every day | ORAL | Status: DC
  Administered 2014-10-18 – 2014-10-23 (×6): 25 mg via ORAL
  Filled 2014-10-18 (×6): qty 1

## 2014-10-18 MED ORDER — ASPIRIN 81 MG PO CHEW: 81.0000 mg | CHEWABLE_TABLET | ORAL | Status: DC

## 2014-10-18 MED ORDER — SODIUM CHLORIDE 0.9 % IJ SOLN
10.0000 mL | Freq: Two times a day (BID) | INTRAMUSCULAR | Status: DC
  Administered 2014-10-18 – 2014-10-20 (×4): 10 mL
  Administered 2014-10-20: 20 mL
  Administered 2014-10-21 – 2014-10-22 (×4): 10 mL
  Administered 2014-10-23: 30 mL

## 2014-10-18 MED ORDER — HYDRALAZINE HCL 10 MG PO TABS
10.0000 mg | ORAL_TABLET | Freq: Three times a day (TID) | ORAL | Status: DC
  Administered 2014-10-18 – 2014-10-19 (×4): 10 mg via ORAL
  Filled 2014-10-18 (×7): qty 1

## 2014-10-18 MED ORDER — SODIUM CHLORIDE 0.9 % IJ SOLN
10.0000 mL | INTRAMUSCULAR | Status: DC | PRN
  Administered 2014-10-18: 10 mL
  Filled 2014-10-18: qty 40

## 2014-10-18 MED ORDER — ATROPINE SULFATE 0.1 MG/ML IJ SOLN
INTRAMUSCULAR | Status: AC
  Filled 2014-10-18: qty 10

## 2014-10-18 MED ORDER — SODIUM CHLORIDE 0.9 % IJ SOLN: 3.0000 mL | INTRAMUSCULAR | Status: DC | PRN

## 2014-10-18 MED ORDER — SODIUM CHLORIDE 0.9 % IV SOLN: 250.0000 mL | INTRAVENOUS | Status: DC | PRN

## 2014-10-18 MED ORDER — SODIUM CHLORIDE 0.9 % IJ SOLN: 3.0000 mL | Freq: Two times a day (BID) | INTRAMUSCULAR | Status: DC

## 2014-10-18 MED ORDER — TORSEMIDE 20 MG PO TABS
40.0000 mg | ORAL_TABLET | Freq: Every day | ORAL | Status: DC
  Administered 2014-10-18 – 2014-10-20 (×3): 40 mg via ORAL
  Filled 2014-10-18 (×3): qty 2

## 2014-10-18 MED ORDER — ISOSORBIDE MONONITRATE 15 MG HALF TABLET
15.0000 mg | ORAL_TABLET | Freq: Every day | ORAL | Status: DC
Start: 2014-10-18 — End: 2014-10-19
  Administered 2014-10-18: 15 mg via ORAL
  Filled 2014-10-18 (×2): qty 1

## 2014-10-18 NOTE — Progress Notes (Signed)
[facility] TEAM 1 - Stepdown/ICU TEAM Progress Note  [name] JQB:[id number] DOB: [birth year] DOA: 10/14/2014 PCP: Thayer Headings, MD  Admit HPI / Brief Narrative: 73 year old WM PMHx ischemic cardiomyopathy, known CAD involving bypass graft currently on medical therapy, mild systolic dysfunction with an EF of 40-45%, and diabetes who presented to the emergency department c/o shortness of breath ongoing for 4 days. He reported coughing without fever or other constitutional signs. This shortness of breath is different from his baseline which is caused by an issue with a paralyzed left hemidiaphragm; he is followed by Dr. Shelle Iron as an outpatient for this. Patient also has chronic exertional chest pain and only occasional resting chest pain. He also has chronic back pain due to spinal stenosis and is unable to lay flat in the bed.  Evaluation in the ER revealed mild leukocytosis with a white count of 12,000, chest x-ray demonstrated mild interstitial infiltrates in the right base. It was felt his presenting symptoms were likely related to community-acquired pneumonia and he was started on empiric antibiotic therapy. There was some concern he may have mild heart failure exacerbation related to the acute illness noting his proBNP was 2372 but this was not much higher than his apparent baseline which is between 2300 and 2400.  After admission patient developed atrial fibrillation with rapid ventricular response and was subsequently started on IV Cardizem drip. He later developed a rectal temp of 102.5 rectally. Subsequently his antibiotics were broadened.  By 11/2 patient continued to require high levels of oxygen at 6 L/m. His ventricular rate was better controlled in the 70s and the Cardizem was weaned and discontinued. Blood pressures remained soft in the 90 systolic range. His creatinine had increased. He was noted to have focal lower extremity edema around 2+ on the left lower extremity and 1+  on the right lower extremity. He had developed bilateral crackles on exam. Follow-up chest x-ray was consistent with pulmonary edema so Lasix was started and influenza PCR was checked.  On 11/3 patient became unstable from a cardiac standpoint as evidenced by persistent A. Fib with RVR and chest pain. He was taken to the Cath Lab where a IABP was placed; Swan-Ganz catheter was also placed and the patient was subsequently sent to the ICU. He was started on IV amiodarone for rate control and a milrinone infusion to help with his cardiogenic shock. He was also initiated on a Lasix infusion. Catheterization did reveal a likely culprit lesion being occlusion of the SVG-PDA which cardiology feels may have been subacute given the fact he has collaterals. Again no good target lesion was identified for intervention.  HPI/Subjective: Patient very alert and states breathing much better then 48 hours prior. Actually making jokes. Denies chest pain.  Assessment/Plan:   CAD of artery bypass graft/ NSTEMI/ Cardiomyopathy, ischemic,EF 40-45% 01/2013 per cath / Pulmonary edema -S/P right and left heart cardiac catheterization with placement of balloon pump for cardiogenic shock with heart failure; see below-managed per cardiology:plan to discontinue Lasix drip 11/5, continue milrinone, wean IABP in hopes of discontinuing-no target lesion for intervention identified but it is suspected that the SVG/PDA was culprit lesion-continue heparin infusion (will be held prior to discontinuation of balloon pump)  Atrial fibrillation with RVR -continue amiodarone per cardiology-metoprolol IV 2.5 mg q 4 hr PRN-rates better controlled but still having episodes of periodic mild tachycardia with activity  Essential hypertension -Blood pressure soft but remains greater than 90  Chronic anticoagulation -Continue heparin drip-transition back to warfarin once balloon  pump out  Fever/CAP vs influenza -patient has been afebrile,and  negative leukocytosis will DC empiric antibiotics-influenza PCR negative  Acute respiratory failure with hypoxia:  --multifactorial due to decompensated ischemic CHF and suspected CAP  Diabetes mellitus -Cont Lantus and resistant SSI- hemoglobin A1c 8.6   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 Shelle Iron with pulmonology as an outpatient   Code Status: FULL Family Communication: wife and daughter at bedside Disposition Plan: remain in ICU until stable from a cardiogenic shock standpoint  Consultants: Dr. Bryan Lemma (cardiology)  Procedure/Significant Events: 11/3 Right & Left Heart Catheterization with Native Coronary and Graft Angiography via RIGHT COMMON FEMORA Artery & Vein Access.INTRA-AORTIC BALLOON PUMP PLACEMENT - for decompensated Combined CHF  11/2 2-D echocardiogram:- Left ventricle: There was moderate concentric hypertrophy. Systolic function was mildly reduced. The estimated ejection fraction was in the range of 45% to 50%. Hypokinesis of the lateral and inferolateral myocardium. Atrial arrhythmia prevents adequate evaluation of LV diastolic function. - Ventricular septum: Septal motion showed paradox. - Aortic valve: Mild regurgitation. - Left atrium: The atrium was mildly dilated. - Right atrium: The atrium was mildly dilated.   Antibiotics: Cefepime 11/1>stopped 11/5 Vancomycin 11/1>11/4  DVT prophylaxis: Heparin drip  Objective: Blood pressure 121/84, pulse 48, temperature 98.2 F (36.8 C), temperature source Core (Comment), resp. rate 23, height 6' (1.829 m), weight 220 lb 14.4 oz (100.2 kg), SpO2 97 %.  Intake/Output Summary (Last 24 hours) at 10/18/14 1306 Last data filed at 10/18/14 1200  Gross per 24 hour  Intake 1932.1 ml  Output   4660 ml  Net -2727.9 ml   Exam: General: no respiratory distress, alert Lungs: Clear to auscultation bilaterally  without wheezes or crackles, 2L Cardiovascular: Irregular rate and rhythm ,no rubs murmurs thrills or gallops, tr. peripheral edema left lower extremity-pulses easily palpable distal to IABP site Abdomen: Nontender, nondistended, soft, bowel sounds positive, no rebound, no ascites, no appreciable mass Extremities: No significant cyanosis, clubbing of bilateral lower extremities  Data Reviewed: Basic Metabolic Panel:  Recent Labs Lab 10/15/14 0237 10/16/14 0053 10/17/14 0345 10/17/14 1415 10/18/14 0430  NA 130* 131* 136* 128* 138  K 4.1 3.9 3.6* 4.1 3.7  CL 91* 91* 95* 89* 95*  CO2 24 24 29 28 30   GLUCOSE 291* 195* 219* 301* 168*  BUN 22 25* 30* 29* 32*  CREATININE 1.47* 1.50* 1.51* 1.52* 1.71*  CALCIUM 8.9 8.9 8.5 8.3* 8.7  MG  --   --  1.9  --   --    Liver Function Tests:  Recent Labs Lab 10/14/14 0944  AST 22  ALT 46  ALKPHOS 85  BILITOT 0.9  PROT 6.6  ALBUMIN 3.3*   CBC:  Recent Labs Lab 10/14/14 0944 10/14/14 1340 10/15/14 0237 10/16/14 0053 10/17/14 0345 10/18/14 0800  WBC 12.7* 10.6* 12.1* 13.0* 11.6* 9.7  NEUTROABS 10.6*  --   --   --   --   --   HGB 15.3 14.6 14.4 14.3 13.1 13.5  HCT 45.6 43.3 43.5 42.9 39.6 40.3  MCV 95.4 95.6 96.2 94.3 94.3 94.4  PLT 145* 143* 170 146* 150 147*   Cardiac Enzymes:  Recent Labs Lab 10/15/14 1332 10/15/14 1947 10/16/14 0053  TROPONINI 4.91* 4.44* 2.95*    CBG:  Recent Labs Lab 10/17/14 1143 10/17/14 1644 10/17/14 2153 10/18/14 0734 10/18/14 1132  GLUCAP 363* 280* 191* 159* 288*    Recent Results (from the past  240 hour(s))  Culture, blood (routine x 2) Call MD if unable to obtain prior to antibiotics being given     Status: None (Preliminary result)   Collection Time: 10/14/14  1:25 PM  Result Value Ref Range Status   Specimen Description BLOOD RIGHT HAND  Final   Special Requests BOTTLES DRAWN AEROBIC AND ANAEROBIC 10CC  Final   Culture  Setup Time   Final    10/15/2014 02:25 Performed at  [facility]    Culture   Final           BLOOD CULTURE RECEIVED NO GROWTH TO DATE CULTURE WILL BE HELD FOR 5 DAYS BEFORE ISSUING A FINAL NEGATIVE REPORT Performed at [facility]    Report Status PENDING  Incomplete  Culture, blood (routine x 2) Call MD if unable to obtain prior to antibiotics being given     Status: None (Preliminary result)   Collection Time: 10/14/14  1:29 PM  Result Value Ref Range Status   Specimen Description BLOOD LEFT HAND  Final   Special Requests BOTTLES DRAWN AEROBIC AND ANAEROBIC 10CC  Final   Culture  Setup Time   Final    10/15/2014 02:25 Performed at [facility]    Culture   Final           BLOOD CULTURE RECEIVED NO GROWTH TO DATE CULTURE WILL BE HELD FOR 5 DAYS BEFORE ISSUING A FINAL NEGATIVE REPORT Performed at [facility]    Report Status PENDING  Incomplete  MRSA PCR Screening     Status: None   Collection Time: 10/14/14  6:37 PM  Result Value Ref Range Status   MRSA by PCR NEGATIVE NEGATIVE Final    Comment:        The GeneXpert MRSA Assay (FDA approved for NASAL specimens only), is one component of a comprehensive MRSA colonization surveillance program. It is not intended to diagnose MRSA infection nor to guide or monitor treatment for MRSA infections.      Studies:  Recent x-ray studies have been reviewed in detail by the Attending Physician  Scheduled Meds:  Scheduled Meds: . antiseptic oral rinse  7 mL Mouth Rinse BID  . aspirin EC  81 mg Oral Daily  . ceFEPime (MAXIPIME) IV  1 g Intravenous 3 times per day  . fluticasone  2 spray Each Nare Daily  . hydrALAZINE  10 mg Oral 3 times per day  . insulin aspart  0-20 Units Subcutaneous TID WC  . insulin aspart  0-5 Units Subcutaneous QHS  . insulin aspart  3 Units Subcutaneous TID WC  . insulin glargine  37 Units Subcutaneous Daily  . isosorbide mononitrate  15 mg Oral Daily  . levothyroxine  175 mcg Oral Daily  . linagliptin  5 mg Oral  Daily  . pantoprazole  40 mg Oral BID  . predniSONE  10 mg Oral Q breakfast  . rosuvastatin  20 mg Oral Daily  . sodium chloride  10-40 mL Intracatheter Q12H  . sodium chloride  3 mL Intravenous Q12H  . spironolactone  25 mg Oral Daily  . torsemide  40 mg Oral Daily  . warfarin  10 mg Oral ONCE-1800  . Warfarin - Pharmacist Dosing Inpatient   Does not apply q1800    Time spent on care of this patient: 35 mins   ELLIS,ALLISON L. , ANP  [facility] Office  [phone] Pager - [phone]  On-Call/Text Page:      [email or web address]  password [email or web address]  If 7PM-7AM, please contact night-coverage [email or web address] Password [email or web address] 10/18/2014, 1:06 PM   LOS: 4 days   Examined patient and discussed assessment and plan with ANP Revonda Standard and agree with above. Wife and daughter at bedside discussed plan of care and answered all questions Patient with multiple complex medical problems> 35 minutes spent in direct patient care

## 2014-10-18 NOTE — Progress Notes (Signed)
Peripherally Inserted Central Catheter/Midline Placement  The IV Nurse has discussed with the patient and/or persons authorized to consent for the patient, the purpose of this procedure and the potential benefits and risks involved with this procedure.  The benefits include less needle sticks, lab draws from the catheter and patient may be discharged home with the catheter.  Risks include, but not limited to, infection, bleeding, blood clot (thrombus formation), and puncture of an artery; nerve damage and irregular heat beat.  Alternatives to this procedure were also discussed.  Wife at bedside. Pt insisted for wife to sign as well due to difficulty seeing the signature line.  Both signatures obtained.  PICC/Midline Placement Documentation  PICC / Midline Double Lumen 10/18/14 PICC Right Basilic 41 cm 0 cm (Active)  Indication for Insertion or Continuance of Line Vasoactive infusions;Limited venous access - need for IV therapy >5 days (PICC only);Prolonged intravenous therapies 10/18/2014 11:00 AM  Exposed Catheter (cm) 0 cm 10/18/2014 11:00 AM  Site Assessment Clean;Dry;Intact 10/18/2014 11:00 AM  Lumen #1 Status Flushed;Saline locked;Blood return noted 10/18/2014 11:00 AM  Lumen #2 Status Flushed;Saline locked;Blood return noted 10/18/2014 11:00 AM  Dressing Type Transparent 10/18/2014 11:00 AM  Dressing Status Clean;Dry;Intact;Antimicrobial disc in place 10/18/2014 11:00 AM  Line Care Connections checked and tightened 10/18/2014 11:00 AM  Dressing Intervention New dressing 10/18/2014 11:00 AM  Dressing Change Due 10/25/14 10/18/2014 11:00 AM       Elliot Dally 10/18/2014, 11:02 AM

## 2014-10-18 NOTE — Progress Notes (Signed)
84fr swan ganz + sheath aspirated and removed from rfv, manual pressure applied for 10 minutes. IABP aspirated and removed from rfa, manual pressure applied to both sites for additional 30 minutes. BP 122/93 hr 117. Hemostasis achieved. No s+s of hematoma, groin level 0. Tegaderm dressing applied, bedrest instructions given. Bilateral dp pulses present and palpable.

## 2014-10-18 NOTE — Progress Notes (Signed)
ANTICOAGULATION CONSULT NOTE - Follow Up Consult  Pharmacy Consult for Heparin / warfarin Indication: atrial fibrillation / IABP  Allergies  Allergen Reactions  . Butrans [Buprenorphine] Shortness Of Breath  . Clarithromycin Other (See Comments)    Unknown, thinks "blisters in mouth"  . Influenza Vaccines Other (See Comments)    Severe coughing for weeks after vaccine; also had flu like symptoms for about 7 to 8 weeks.   . Lanoxin [Digoxin] Other (See Comments)    Nausea, anorexia  . Lisinopril Other (See Comments)    cough  . Ranexa [Ranolazine Er]   . Statins     Patient Measurements: Height: 6' (182.9 cm) Weight: 220 lb 14.4 oz (100.2 kg) IBW/kg (Calculated) : 77.6 Heparin Dosing Weight: 97 kg  Vital Signs: Temp: 98.2 F (36.8 C) (11/05 1000) Temp Source: Core (Comment) (11/05 1200) BP: 116/40 mmHg (11/05 1100) Pulse Rate: 52 (11/05 1100)  Labs:  Recent Labs  10/15/14 1332  10/15/14 1947  10/16/14 0053  10/17/14 0345 10/17/14 1400 10/17/14 1415 10/17/14 2250 10/18/14 0430 10/18/14 0800  HGB  --   --   --   < > 14.3  --  13.1  --   --   --   --  13.5  HCT  --   --   --   --  42.9  --  39.6  --   --   --   --  40.3  PLT  --   --   --   --  146*  --  150  --   --   --   --  147*  LABPROT  --   < > 23.7*  --  21.1*  --  20.4*  --   --   --  18.4*  --   INR  --   < > 2.09*  --  1.81*  --  1.73*  --   --   --  1.52*  --   HEPARINUNFRC  --   --   --   --   --   < > <0.10* <0.10*  --  0.28* 0.35  --   CREATININE  --   --   --   < > 1.50*  --  1.51*  --  1.52*  --  1.71*  --   TROPONINI 4.91*  --  4.44*  --  2.95*  --   --   --   --   --   --   --   < > = values in this interval not displayed.  Estimated Creatinine Clearance: 45.7 mL/min (by C-G formula based on Cr of 1.71).  Assessment: 73 yo M with hx of Afib - on warfarin PTA held for cath.  S/p  cath now on IABP and heparin drip used for [facility] hpearin drip 1800 uts/hr HL 0.35.  CBC stable, no s/s of bleed.  Heparin being d/c 11/5 after pulling IABP.  Will restart warfarin tonight. New Amiodarone this admit - will effect warfarin dosing.    Goal of Therapy:  INR 2-3 Monitor platelets by anticoagulation protocol: Yes   Plan:  D/C heparin drip Warfarin 10mg  x1 tonight Daily Protime  Pharm.D. CPP, BCPS Clinical Pharmacist [phone] 10/18/2014 12:38 PM

## 2014-10-18 NOTE — Progress Notes (Signed)
Patient ID: [name], male   DOB: April 01, 1941, 73 y.o.   MRN: [id number]   SUBJECTIVE: Currently, patient is stable.  Good diuresis yesterday, breathing better today and down to nasal cannula.  Remains in atrial fibrillation 100s-110s on amiodarone gtt.  Now has milrinone gtt running and IABP in place augmenting appropriately.   LHC/RHC (11/3): Patent LIMA-LAD and SVG-OM TO SVG-PDA (likely culprit) with TO PDA and 60% proximal RCA.  There were some left=>PDA collaterals PCWP 35  Swan numbers today: CVP 5 PA 41/17 PCWP 18 CI 1.8  Co-ox 60%  Scheduled Meds: . antiseptic oral rinse  7 mL Mouth Rinse BID  . aspirin EC  81 mg Oral Daily  . ceFEPime (MAXIPIME) IV  1 g Intravenous 3 times per day  . fluticasone  2 spray Each Nare Daily  . insulin aspart  0-20 Units Subcutaneous TID WC  . insulin aspart  0-5 Units Subcutaneous QHS  . insulin aspart  3 Units Subcutaneous TID WC  . insulin glargine  37 Units Subcutaneous Daily  . levothyroxine  175 mcg Oral Daily  . linagliptin  5 mg Oral Daily  . pantoprazole  40 mg Oral BID  . predniSONE  10 mg Oral Q breakfast  . rosuvastatin  20 mg Oral Daily  . sodium chloride  3 mL Intravenous Q12H  . spironolactone  12.5 mg Oral Daily  . torsemide  40 mg Oral Daily   Continuous Infusions: . sodium chloride Stopped (10/18/14 0300)  . amiodarone 30 mg/hr (10/18/14 0500)  . heparin 1,800 Units/hr (10/18/14 0600)  . milrinone 0.125 mcg/kg/min (10/17/14 1542)   PRN Meds:.sodium chloride, acetaminophen **OR** acetaminophen, HYDROcodone-acetaminophen, metoprolol, nitroGLYCERIN, ondansetron **OR** ondansetron (ZOFRAN) IV, polyethylene glycol, sodium chloride    Filed Vitals:   10/18/14 0400 10/18/14 0500 10/18/14 0600 10/18/14 0648  BP: 90/58 108/75 88/50 121/84  Pulse: 129 39 38 100  Temp: 97.2 F (36.2 C) 97.5 F (36.4 C) 97.7 F (36.5 C) 97.5 F (36.4 C)  TempSrc: Core (Comment)     Resp: 17 25 21 25   Height:      Weight:  220 lb  14.4 oz (100.2 kg)    SpO2: 94% 97% 97% 96%    Intake/Output Summary (Last 24 hours) at 10/18/14 0729 Last data filed at 10/18/14 0700  Gross per 24 hour  Intake 2119.1 ml  Output   4940 ml  Net -2820.9 ml    LABS: Basic Metabolic Panel:  Recent Labs  13/05/15 0345 10/17/14 1415 10/18/14 0430  NA 136* 128* 138  K 3.6* 4.1 3.7  CL 95* 89* 95*  CO2 29 28 30   GLUCOSE 219* 301* 168*  BUN 30* 29* 32*  CREATININE 1.51* 1.52* 1.71*  CALCIUM 8.5 8.3* 8.7  MG 1.9  --   --    Liver Function Tests: No results for input(s): AST, ALT, ALKPHOS, BILITOT, PROT, ALBUMIN in the last 72 hours. No results for input(s): LIPASE, AMYLASE in the last 72 hours. CBC:  Recent Labs  10/16/14 0053 10/17/14 0345  WBC 13.0* 11.6*  HGB 14.3 13.1  HCT 42.9 39.6  MCV 94.3 94.3  PLT 146* 150   Cardiac Enzymes:  Recent Labs  10/15/14 1332 10/15/14 1947 10/16/14 0053  TROPONINI 4.91* 4.44* 2.95*   BNP: Invalid input(s): POCBNP D-Dimer: No results for input(s): DDIMER in the last 72 hours. Hemoglobin A1C: No results for input(s): HGBA1C in the last 72 hours. Fasting Lipid Panel: No results for input(s): CHOL, HDL, LDLCALC, TRIG,  CHOLHDL, LDLDIRECT in the last 72 hours. Thyroid Function Tests: No results for input(s): TSH, T4TOTAL, T3FREE, THYROIDAB in the last 72 hours.  Invalid input(s): FREET3 Anemia Panel: No results for input(s): VITAMINB12, FOLATE, FERRITIN, TIBC, IRON, RETICCTPCT in the last 72 hours.  RADIOLOGY: Dg Chest Port 1 View  10/17/2014   CLINICAL DATA:  Placement of the intra-aortic balloon pump a cyst.  EXAM: PORTABLE CHEST - 1 VIEW  COMPARISON:  10/15/2014  FINDINGS: Postoperative changes in the mediastinum. Interval placement of central venous catheter via in superior approach with tip over the left pulmonary outflow tract. No pneumothorax. Cardiac enlargement with pulmonary vascular congestion and perihilar infiltrates suggesting edema. Small bilateral pleural  effusions with basilar atelectasis. Radiopaque EIA BP tip localized over the upper descending thoracic aorta.  IMPRESSION: Appliances appear to be in satisfactory location. Cardiac enlargement with pulmonary vascular congestion, perihilar edema, and small bilateral pleural effusions.   Electronically Signed   By: Burman Nieves M.D.   On: 10/17/2014 00:09   Dg Chest Port 1 View  10/15/2014   CLINICAL DATA:  Hypoxia  EXAM: PORTABLE CHEST - 1 VIEW  COMPARISON:  Yesterday  FINDINGS: Vascular congestion and diffuse edema have developed. Upper normal heart size. No pneumothorax. Small pleural effusions are now present.  IMPRESSION: New edema and small pleural effusions.   Electronically Signed   By: Maryclare Bean M.D.   On: 10/15/2014 09:06   Dg Chest Port 1 View  10/14/2014   CLINICAL DATA:  Chest pain.  EXAM: PORTABLE CHEST - 1 VIEW  COMPARISON:  Chest x-ray 05/14/2013  FINDINGS: Prior CABG. Cardiomegaly. Pulmonary vascularity is normal. Mild infiltrate medial right lung base. No pleural effusion. Stable left pleural thickening noted consistent scarring. Degenerative changes thoracic spine both shoulders.  IMPRESSION: 1. Mild infiltrate medial right lung base. 2. Cardiomegaly.  Prior CABG.  No pulmonary venous congestion.   Electronically Signed   By: Maisie Fus  Register   On: 10/14/2014 10:10    PHYSICAL EXAM General: NAD Neck: JVP 8 cm, no thyromegaly or thyroid nodule.  Lungs: Crackles at bases bilaterally. CV: Nondisplaced PMI.  Heart tachy, irregular S1/S2, no S3/S4, no murmur.  Trace ankle edema.  IABP sounds. DP pulse 1+ bilaterally.  Abdomen: Soft, nontender, no hepatosplenomegaly, no distention.  Neurologic: Alert and oriented x 3.  Psych: Anxious Extremities: No clubbing or cyanosis.   TELEMETRY: Reviewed telemetry pt in atrial fibrillation  ASSESSMENT AND PLAN: 1. Atrial fibrillation: Chronic, likely permanent. Rate control with amiodarone gtt, on heparin gtt with IABP. 2. Acute on chronic  systolic CHF with cardiogenic shock:  Echo on 11/2 showed that EF is about 25% with wall motion abnormalities. This is down from the past. IABP placed 11/3 with cardiogenic shock and milrinone begun with persistently low co-ox.  He had very high filling pressures at RHC.  He diuresed well yesterday, CVP and PCWP much better overall.  CI still low at 1.8 but co-ox ok at 60%.   - Continue current milrinone, HR stable.  - Stop Lasix gtt today (hemodynamics better and creatinine rising), will use torsemide 40 mg daily.   - Will wean IABP to 1:2.  If he tolerates this for a few hours, will wean to 1:3, turn off heparin, and pull device.    - Holding ACEI with soft BP and elevated creatinine.  - May increase spironolactone to 25 mg daily and replete K. - As IABP weaned, add afterload reduction with hydralazine 10 tid and Imdur 15 daily.  3. ID: Possible PNA, fever earlier in stay. Mild WBC elevation. Procalcitonin is elevated.  I do not think this is the only process but may play a role. Influenza negative.  - Antibiotics per primary service (cefepime/vancomycin).  4. Elevated left hemidiaphragm: Chronic dyspnea. No history of smoking so doubt component of emphysema.  5. CAD: h/o CABG. NSTEMI this admission with TnI 4.9 peak. EF lower compared to 1/14, so concern for true ACS with plaque rupture rather than demand ischemia. Has chronic LBBB.No further chest pain.  Angiography as above showed that culprit was likely occlusion of SVG-PDA though this may have been subacute given some collaterals.  No good target for intervention. - Continue ASA, statin.  6. AKI: Mild rise in creatinine.  As CVP and PCWP better, will change to po torsemide.     Marca Ancona 10/18/2014 7:29 AM

## 2014-10-18 NOTE — Plan of Care (Signed)
Problem: Phase I Progression Outcomes Goal: Dyspnea controlled at rest Outcome: Completed/Met Date Met:  10/18/14 Goal: Flu/PneumoVaccines if indicated Outcome: Completed/Met Date Met:  10/18/14 Goal: Pain controlled Outcome: Completed/Met Date Met:  10/18/14 Goal: Tolerating diet Outcome: Completed/Met Date Met:  10/18/14 Goal: Other Phase II Outcomes/Goals Outcome: Not Applicable Date Met:  38/37/79  Problem: Phase II Progression Outcomes Goal: Pain controlled on oral analgesia Outcome: Completed/Met Date Met:  10/18/14 Goal: Tolerating diet Outcome: Completed/Met Date Met:  10/18/14

## 2014-10-18 NOTE — Care Management Note (Addendum)
    Page 1 of 2   10/23/2014     10:16:50 AM CARE MANAGEMENT NOTE 10/23/2014  Patient:  [name], [name]   Account Number:  [id number]  Date Initiated:  10/15/2014  Documentation initiated by:  [facility]  Subjective/Objective Assessment:   pneumonia     Action/Plan:   Anticipated DC Date:  10/23/2014   Anticipated DC Plan:  HOME W HOME HEALTH SERVICES      DC Planning Services  CM consult      [facility] Choice  HOME HEALTH  DURABLE MEDICAL EQUIPMENT   Choice offered to / List presented to:  C-1 Patient   DME arranged  [facility]  Levan Hurst      DME agency  [facility]     [facility] arranged  HH-1 RN      [facility] agency  [facility]   Status of service:  Completed, signed off Medicare Important Message given?  YES (If response is "NO", the following Medicare IM given date fields will be blank) Date Medicare IM given:  10/18/2014 Medicare IM given by:  [facility] Date Additional Medicare IM given:  10/22/2014 Additional Medicare IM given by:  Junius Creamer  Discharge Disposition:  HOME [facility] SERVICES  Per UR Regulation:  Reviewed for med. necessity/level of care/duration of stay  If discussed at Long Length of Stay Meetings, dates discussed:   10/23/2014    Comments:  ContactJayd, Cadieux Spouse [phone]  [phone]                 Larence, Thone [phone]  11/10 1015 debbie De Libman rn,bsn spoke w pt-wife-son. they would like rolator for home. have alerted [facility] and they will del to home w o2 concentrator. port o2 to room. alerted bethany of dc home w [facility] for hhrn visit.  11/9 1020a debbie Susann Lawhorne rn,bsn spoke w pt and went over [facility] agency list. no pref. hhrn ref made to [facility]. poss dc in am. pt states has rolling walker but will need home o2. have alerted jermaine w [facility] of dc for am and will need home o2.  10-18-14 11:55am Avie Arenas, RNBSN - [phone]  [phone] Continues on Milrinone, Amio and hep and balloon pump. Wife and  daughter in room.  Prior to admission lives independently with wife.  Hope for discharge home when able.  10/15/2014 1530 Chart reviewed. Isidoro Donning RN CCM Case Mgmt

## 2014-10-19 LAB — BASIC METABOLIC PANEL
Anion gap: 13 (ref 5–15)
BUN: 31 mg/dL — AB (ref 6–23)
CHLORIDE: 93 meq/L — AB (ref 96–112)
CO2: 29 meq/L (ref 19–32)
CREATININE: 1.57 mg/dL — AB (ref 0.50–1.35)
Calcium: 8.6 mg/dL (ref 8.4–10.5)
GFR calc non Af Amer: 41 mL/min — ABNORMAL LOW (ref >90)
GFR, EST AFRICAN AMERICAN: 48 mL/min — AB (ref >90)
Glucose, Bld: 201 mg/dL — ABNORMAL HIGH (ref 70–99)
POTASSIUM: 4 meq/L (ref 3.7–5.3)
Sodium: 135 mEq/L — ABNORMAL LOW (ref 137–147)

## 2014-10-19 LAB — GLUCOSE, CAPILLARY
GLUCOSE-CAPILLARY: 291 mg/dL — AB (ref 70–99)
GLUCOSE-CAPILLARY: 205 mg/dL — AB (ref 70–99)
Glucose-Capillary: 143 mg/dL — ABNORMAL HIGH (ref 70–99)
Glucose-Capillary: 204 mg/dL — ABNORMAL HIGH (ref 70–99)

## 2014-10-19 LAB — CARBOXYHEMOGLOBIN
CARBOXYHEMOGLOBIN: 0.9 % (ref 0.5–1.5)
Carboxyhemoglobin: 1 % (ref 0.5–1.5)
Carboxyhemoglobin: 0.8 % (ref 0.5–1.5)
METHEMOGLOBIN: 1.1 % (ref 0.0–1.5)
METHEMOGLOBIN: 1 % (ref 0.0–1.5)
Methemoglobin: 1.1 % (ref 0.0–1.5)
O2 SAT: 49.2 %
O2 SAT: 51.5 %
O2 Saturation: 67 %
Total hemoglobin: 13.6 g/dL (ref 13.5–18.0)
Total hemoglobin: 13.1 g/dL — ABNORMAL LOW (ref 13.5–18.0)
Total hemoglobin: 14.3 g/dL (ref 13.5–18.0)

## 2014-10-19 LAB — CBC
HEMATOCRIT: 39.3 % (ref 39.0–52.0)
HEMOGLOBIN: 13 g/dL (ref 13.0–17.0)
MCH: 31.4 pg (ref 26.0–34.0)
MCHC: 33.1 g/dL (ref 30.0–36.0)
MCV: 94.9 fL (ref 78.0–100.0)
Platelets: 156 10*3/uL (ref 150–400)
RBC: 4.14 MIL/uL — AB (ref 4.22–5.81)
RDW: 15.1 % (ref 11.5–15.5)
WBC: 9.7 10*3/uL (ref 4.0–10.5)

## 2014-10-19 LAB — PROTIME-INR
INR: 1.31 (ref 0.00–1.49)
Prothrombin Time: 16.4 seconds — ABNORMAL HIGH (ref 11.6–15.2)

## 2014-10-19 LAB — PROCALCITONIN: Procalcitonin: 0.31 ng/mL

## 2014-10-19 MED ORDER — DIGOXIN 125 MCG PO TABS
0.1250 mg | ORAL_TABLET | Freq: Every day | ORAL | Status: DC
Start: 2014-10-19 — End: 2014-10-19
  Filled 2014-10-19: qty 1

## 2014-10-19 MED ORDER — ISOSORBIDE MONONITRATE ER 30 MG PO TB24
30.0000 mg | ORAL_TABLET | Freq: Every day | ORAL | Status: DC
  Administered 2014-10-19 – 2014-10-21 (×3): 30 mg via ORAL
  Filled 2014-10-19 (×4): qty 1

## 2014-10-19 MED ORDER — DIGOXIN 250 MCG PO TABS
0.2500 mg | ORAL_TABLET | Freq: Once | ORAL | Status: DC
  Filled 2014-10-19: qty 1

## 2014-10-19 MED ORDER — METOPROLOL SUCCINATE ER 25 MG PO TB24
25.0000 mg | ORAL_TABLET | Freq: Two times a day (BID) | ORAL | Status: DC
  Administered 2014-10-19 – 2014-10-21 (×6): 25 mg via ORAL
  Filled 2014-10-19 (×8): qty 1

## 2014-10-19 MED ORDER — WARFARIN SODIUM 2.5 MG PO TABS
12.5000 mg | ORAL_TABLET | Freq: Once | ORAL | Status: AC
  Administered 2014-10-19: 12.5 mg via ORAL
  Filled 2014-10-19: qty 1

## 2014-10-19 MED ORDER — DIGOXIN 125 MCG PO TABS: 0.1250 mg | ORAL_TABLET | Freq: Every day | ORAL | Status: DC

## 2014-10-19 MED ORDER — HYDRALAZINE HCL 25 MG PO TABS
25.0000 mg | ORAL_TABLET | Freq: Three times a day (TID) | ORAL | Status: DC
  Administered 2014-10-19 – 2014-10-23 (×12): 25 mg via ORAL
  Filled 2014-10-19 (×15): qty 1

## 2014-10-19 NOTE — Progress Notes (Signed)
Inpatient Diabetes Program Recommendations  AACE/ADA: New Consensus Statement on Inpatient Glycemic Control (2013)  Target Ranges:  Prepandial:   less than 140 mg/dL      Peak postprandial:   less than 180 mg/dL (1-2 hours)      Critically ill patients:  140 - 180 mg/dL   Reason for Assessment:  Results for KASSIM, GUERTIN (MRN [id number]) as of 10/19/2014 12:24  Ref. Range 10/18/2014 07:34 10/18/2014 11:32 10/18/2014 16:05 10/18/2014 21:06 10/19/2014 07:21  Glucose-Capillary Latest Range: 70-99 mg/dL 245 (H) 809 (H) 983 (H) 187 (H) 143 (H)    May consider increasing Novolog meal coverage to 5 units tid with meals (hold if patient eats less than 50%).  Thanks,  Beryl Meager, RN, BC-ADM Inpatient Diabetes Coordinator Pager [phone]

## 2014-10-19 NOTE — Progress Notes (Signed)
[facility] TEAM 1 - Stepdown/ICU TEAM Progress Note  OTHO MICHALIK WUJ:[id number] DOB: 23-Nov-1941 DOA: 10/14/2014 PCP: Thayer Headings, MD  Admit HPI / Brief Narrative: 73 year old WM PMHx ischemic cardiomyopathy, known CAD involving bypass graft currently on medical therapy, mild systolic dysfunction with an EF of 40-45%, and diabetes who presented to the emergency department c/o shortness of breath ongoing for 4 days. He reported coughing without fever or other constitutional signs. This shortness of breath is different from his baseline which is caused by an issue with a paralyzed left hemidiaphragm; he is followed by Dr. Shelle Iron as an outpatient for this. Patient also has chronic exertional chest pain and only occasional resting chest pain. He also has chronic back pain due to spinal stenosis and is unable to lay flat in the bed.  Evaluation in the ER revealed mild leukocytosis with a white count of 12,000, chest x-ray demonstrated mild interstitial infiltrates in the right base. It was felt his presenting symptoms were likely related to community-acquired pneumonia and he was started on empiric antibiotic therapy. There was some concern he may have mild heart failure exacerbation related to the acute illness noting his proBNP was 2372 but this was not much higher than his apparent baseline which is between 2300 and 2400.  After admission patient developed atrial fibrillation with rapid ventricular response and was subsequently started on IV Cardizem drip. He later developed a rectal temp of 102.5 rectally. Subsequently his antibiotics were broadened.  By 11/2 patient continued to require high levels of oxygen at 6 L/m. His ventricular rate was better controlled in the 70s and the Cardizem was weaned and discontinued. Blood pressures remained soft in the 90 systolic range. His creatinine had increased. He was noted to have focal lower extremity edema around 2+ on the left lower extremity and 1+  on the right lower extremity. He had developed bilateral crackles on exam. Follow-up chest x-ray was consistent with pulmonary edema so Lasix was started and influenza PCR was checked.  On 11/3 patient became unstable from a cardiac standpoint as evidenced by persistent A. Fib with RVR and chest pain. He was taken to the Cath Lab where a IABP was placed; Swan-Ganz catheter was also placed and the patient was subsequently sent to the ICU. He was started on IV amiodarone for rate control and a milrinone infusion to help with his cardiogenic shock. He was also initiated on a Lasix infusion. Catheterization did reveal a likely culprit lesion being occlusion of the SVG-PDA which cardiology feels may have been subacute given the fact he has collaterals. Again no good target lesion was identified for intervention.  The IABP was dc'd 11/5 and Milrinone weaning initiated. By 11/6 Milrinone was dc'd and IV Amiodarone dc'd in favor of metoprolol and digoxin. His Lasix drip had previously been transitioned to PO Demadex 11/5. Cardiology transferred him to SDU. PT/OT evaluation pending.  HPI/Subjective: Patient alert and denies CP or SOB at rest  Assessment/Plan:   CAD of artery bypass graft/ NSTEMI/ Cardiomyopathy, ischemic,EF 40-45% 01/2013 per cath / Pulmonary edema -S/P right and left heart cardiac catheterization -req. IABP for cardiogenic shock with heart failure; (see below)-managed per cardiology: Lasix drip was dc'd 11/5, milrinone dc'd 11/6, IABP discontinued 11/5-no target lesion for intervention identified but it is suspected that the SVG/PDA was culprit lesion-cont Demadex and Spironoloactone; Imdur and hydralazine-? Resume Plavix? Cont statin  Atrial fibrillation with RVR -Discontinue IV amiodarone (per cardiology) in favor of Metoprolol and digoxin-rates better controlled   Essential  hypertension -Blood pressure stable  Chronic anticoagulation -Continue heparin drip-transitioned back to  warfarin now that balloon pump out  Fever/CAP vs influenza -patient has been afebrile,and negative leukocytosis so DC'd empiric antibiotics 11/4 and 11/5 -influenza PCR negative  Acute respiratory failure with hypoxia:  --multifactorial due to decompensated ischemic CHF and suspected CAP  Diabetes mellitus -Cont Lantus, Tradjenta and resistant SSI- hemoglobin A1c 8.6   Physical Deconditioning PT/OT eval- need to push pt  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 Shelle Iron with pulmonology as an outpatient   Code Status: FULL Family Communication: No family at bedside Disposition Plan: Transfer to SDU  Consultants: Dr. Bryan Lemma (cardiology)  Procedure/Significant Events: 11/3 Right & Left Heart Catheterization with Native Coronary and Graft Angiography via RIGHT COMMON FEMORA Artery & Vein Access.INTRA-AORTIC BALLOON PUMP PLACEMENT - for decompensated Combined CHF  11/2 2-D echocardiogram:- Left ventricle: There was moderate concentric hypertrophy. Systolic function was mildly reduced. The estimated ejection fraction was in the range of 45% to 50%. Hypokinesis of the lateral and inferolateral myocardium. Atrial arrhythmia prevents adequate evaluation of LV diastolic function. - Ventricular septum: Septal motion showed paradox. - Aortic valve: Mild regurgitation. - Left atrium: The atrium was mildly dilated. - Right atrium: The atrium was mildly dilated.   Antibiotics: Cefepime 11/1>stopped 11/5 Vancomycin 11/1>11/4  DVT prophylaxis: Heparin drip 11/5  Objective: Blood pressure 111/78, pulse 83, temperature 97.7 F (36.5 C), temperature source Oral, resp. rate 17, height 6' (1.829 m), weight 219 lb 12.8 oz (99.701 kg), SpO2 97 %.  Intake/Output Summary (Last 24 hours) at 10/19/14 1131 Last data filed at 10/19/14 0900  Gross per 24 hour  Intake 1317.6 ml  Output    1520 ml  Net -202.4 ml   Exam: General: no respiratory distress, alert and talkative Lungs: Clear to auscultation bilaterally without wheezes or crackles, 2L Cardiovascular: Irregular rate and rhythm ,no rubs murmurs thrills or gallops, tr. peripheral edema left lower extremity Abdomen: Nontender, nondistended, soft, bowel sounds positive, no rebound, no ascites, no appreciable mass Extremities: No significant cyanosis, clubbing of bilateral lower extremities  Data Reviewed: Basic Metabolic Panel:  Recent Labs Lab 10/16/14 0053 10/17/14 0345 10/17/14 1415 10/18/14 0430 10/19/14 0320  NA 131* 136* 128* 138 135*  K 3.9 3.6* 4.1 3.7 4.0  CL 91* 95* 89* 95* 93*  CO2 24 29 28 30 29   GLUCOSE 195* 219* 301* 168* 201*  BUN 25* 30* 29* 32* 31*  CREATININE 1.50* 1.51* 1.52* 1.71* 1.57*  CALCIUM 8.9 8.5 8.3* 8.7 8.6  MG  --  1.9  --   --   --    Liver Function Tests:  Recent Labs Lab 10/14/14 0944  AST 22  ALT 46  ALKPHOS 85  BILITOT 0.9  PROT 6.6  ALBUMIN 3.3*   CBC:  Recent Labs Lab 10/14/14 0944  10/15/14 0237 10/16/14 0053 10/17/14 0345 10/18/14 0800 10/19/14 0320  WBC 12.7*  < > 12.1* 13.0* 11.6* 9.7 9.7  NEUTROABS 10.6*  --   --   --   --   --   --   HGB 15.3  < > 14.4 14.3 13.1 13.5 13.0  HCT 45.6  < > 43.5 42.9 39.6 40.3 39.3  MCV 95.4  < > 96.2 94.3 94.3 94.4 94.9  PLT 145*  < > 170 146* 150 147* 156  < > = values in this interval not displayed. Cardiac Enzymes:  Recent Labs  Lab 10/15/14 1332 10/15/14 1947 10/16/14 0053  TROPONINI 4.91* 4.44* 2.95*    CBG:  Recent Labs Lab 10/18/14 0734 10/18/14 1132 10/18/14 1605 10/18/14 2106 10/19/14 0721  GLUCAP 159* 288* 292* 187* 143*    Recent Results (from the past 240 hour(s))  Culture, blood (routine x 2) Call MD if unable to obtain prior to antibiotics being given     Status: None (Preliminary result)   Collection Time: 10/14/14  1:25 PM  Result Value Ref Range Status   Specimen Description  BLOOD RIGHT HAND  Final   Special Requests BOTTLES DRAWN AEROBIC AND ANAEROBIC 10CC  Final   Culture  Setup Time   Final    10/15/2014 02:25 Performed at [facility]    Culture   Final           BLOOD CULTURE RECEIVED NO GROWTH TO DATE CULTURE WILL BE HELD FOR 5 DAYS BEFORE ISSUING A FINAL NEGATIVE REPORT Performed at [facility]    Report Status PENDING  Incomplete  Culture, blood (routine x 2) Call MD if unable to obtain prior to antibiotics being given     Status: None (Preliminary result)   Collection Time: 10/14/14  1:29 PM  Result Value Ref Range Status   Specimen Description BLOOD LEFT HAND  Final   Special Requests BOTTLES DRAWN AEROBIC AND ANAEROBIC 10CC  Final   Culture  Setup Time   Final    10/15/2014 02:25 Performed at [facility]    Culture   Final           BLOOD CULTURE RECEIVED NO GROWTH TO DATE CULTURE WILL BE HELD FOR 5 DAYS BEFORE ISSUING A FINAL NEGATIVE REPORT Performed at [facility]    Report Status PENDING  Incomplete  MRSA PCR Screening     Status: None   Collection Time: 10/14/14  6:37 PM  Result Value Ref Range Status   MRSA by PCR NEGATIVE NEGATIVE Final    Comment:        The GeneXpert MRSA Assay (FDA approved for NASAL specimens only), is one component of a comprehensive MRSA colonization surveillance program. It is not intended to diagnose MRSA infection nor to guide or monitor treatment for MRSA infections.      Studies:  Recent x-ray studies have been reviewed in detail by the Attending Physician  Scheduled Meds:  Scheduled Meds: . antiseptic oral rinse  7 mL Mouth Rinse BID  . aspirin EC  81 mg Oral Daily  . fluticasone  2 spray Each Nare Daily  . hydrALAZINE  25 mg Oral 3 times per day  . insulin aspart  0-20 Units Subcutaneous TID WC  . insulin aspart  0-5 Units Subcutaneous QHS  . insulin aspart  3 Units Subcutaneous TID WC  . insulin glargine  37 Units Subcutaneous Daily  . isosorbide  mononitrate  30 mg Oral Daily  . levothyroxine  175 mcg Oral Daily  . linagliptin  5 mg Oral Daily  . metoprolol succinate  25 mg Oral BID  . pantoprazole  40 mg Oral BID  . predniSONE  10 mg Oral Q breakfast  . rosuvastatin  20 mg Oral Daily  . sodium chloride  10-40 mL Intracatheter Q12H  . spironolactone  25 mg Oral Daily  . torsemide  40 mg Oral Daily  . warfarin  12.5 mg Oral ONCE-1800  . Warfarin - Pharmacist Dosing Inpatient   Does not apply q1800    Time spent on  care of this patient: 35 mins   ELLIS,ALLISON L. , ANP  [facility] Office  [phone] Pager - [phone]  On-Call/Text Page:      [email or web address]      password [email or web address]  If 7PM-7AM, please contact night-coverage [email or web address] Password [email or web address] 10/19/2014, 11:31 AM   LOS: 5 days

## 2014-10-19 NOTE — Progress Notes (Signed)
ANTICOAGULATION CONSULT NOTE - Follow Up Consult  Pharmacy Consult for Coumadin Indication: atrial fibrillation   Allergies  Allergen Reactions  . Butrans [Buprenorphine] Shortness Of Breath  . Clarithromycin Other (See Comments)    Unknown, thinks "blisters in mouth"  . Influenza Vaccines Other (See Comments)    Severe coughing for weeks after vaccine; also had flu like symptoms for about 7 to 8 weeks.   . Lanoxin [Digoxin] Other (See Comments)    Nausea, anorexia  . Lisinopril Other (See Comments)    cough  . Ranexa [Ranolazine Er]   . Statins     Patient Measurements: Height: 6' (182.9 cm) Weight: 219 lb 12.8 oz (99.701 kg) IBW/kg (Calculated) : 77.6  Vital Signs: Temp: 97.5 F (36.4 C) (11/06 0700) Temp Source: Oral (11/06 0700) BP: 141/87 mmHg (11/06 0900) Pulse Rate: 88 (11/06 0850)  Labs:  Recent Labs  10/17/14 0345 10/17/14 1400 10/17/14 1415 10/17/14 2250 10/18/14 0430 10/18/14 0800 10/19/14 0320  HGB 13.1  --   --   --   --  13.5 13.0  HCT 39.6  --   --   --   --  40.3 39.3  PLT 150  --   --   --   --  147* 156  LABPROT 20.4*  --   --   --  18.4*  --  16.4*  INR 1.73*  --   --   --  1.52*  --  1.31  HEPARINUNFRC <0.10* <0.10*  --  0.28* 0.35  --   --   CREATININE 1.51*  --  1.52*  --  1.71*  --  1.57*    Estimated Creatinine Clearance: 49.7 mL/min (by C-G formula based on Cr of 1.57).  Assessment: 73yom on coumadin pta for afib. Coumadin held on admission for cath and heparin bridge started. Heparin was resumed post-cath as patient needed an IABP. IABP was pulled yesterday, heparin discontinued, and coumadin resumed. INR below goal at 1.3 and trending down. Amiodarone drip was started on admission but discontinued today.  Goal of Therapy:  INR 2-3 Monitor platelets by anticoagulation protocol: Yes   Plan:  1) Increase coumadin to 12.5mg  x 1 2) INR in AM  Louie Casa, PharmD, BCPS 10/19/2014 9:49 AM

## 2014-10-19 NOTE — Progress Notes (Signed)
Received order however noted pts deconditioning. Will await PT eval first. Ethelda Chick CES, ACSM 9:45 AM 10/19/2014

## 2014-10-19 NOTE — Progress Notes (Signed)
Patient ID: [name], male   DOB: July 27, 1941, 72 y.o.   MRN: [id number]   SUBJECTIVE:  Yesterday IABP and swan removed. PICC placed. CO-OX  67% . Lasix drip stopped. Hydralazine/imdur added and spiro increased to 25 mg daily.  Denies SOB/CP.    LHC/RHC (11/3): Patent LIMA-LAD and SVG-OM TO SVG-PDA (likely culprit) with TO PDA and 60% proximal RCA.  There were some left=>PDA collaterals   CVP 9 Co-ox 60%  Scheduled Meds: . antiseptic oral rinse  7 mL Mouth Rinse BID  . aspirin EC  81 mg Oral Daily  . fluticasone  2 spray Each Nare Daily  . hydrALAZINE  10 mg Oral 3 times per day  . insulin aspart  0-20 Units Subcutaneous TID WC  . insulin aspart  0-5 Units Subcutaneous QHS  . insulin aspart  3 Units Subcutaneous TID WC  . insulin glargine  37 Units Subcutaneous Daily  . isosorbide mononitrate  15 mg Oral Daily  . levothyroxine  175 mcg Oral Daily  . linagliptin  5 mg Oral Daily  . pantoprazole  40 mg Oral BID  . predniSONE  10 mg Oral Q breakfast  . rosuvastatin  20 mg Oral Daily  . sodium chloride  10-40 mL Intracatheter Q12H  . spironolactone  25 mg Oral Daily  . torsemide  40 mg Oral Daily  . Warfarin - Pharmacist Dosing Inpatient   Does not apply q1800   Continuous Infusions: . sodium chloride Stopped (10/18/14 1500)  . amiodarone 30 mg/hr (10/19/14 0430)  . milrinone 0.125 mcg/kg/min (10/18/14 1325)   PRN Meds:.acetaminophen **OR** acetaminophen, HYDROcodone-acetaminophen, nitroGLYCERIN, ondansetron **OR** ondansetron (ZOFRAN) IV, polyethylene glycol, sodium chloride    Filed Vitals:   10/19/14 0300 10/19/14 0400 10/19/14 0500 10/19/14 0600  BP: 140/80 147/90 134/88 126/89  Pulse: 50 80 90 40  Temp:  97.4 F (36.3 C)    TempSrc:  Oral    Resp: 18 15 18 22   Height:      Weight:  219 lb 12.8 oz (99.701 kg)    SpO2: 95% 100% 97% 97%    Intake/Output Summary (Last 24 hours) at 10/19/14 0704 Last data filed at 10/19/14 0600  Gross per 24 hour  Intake  1731.6 ml  Output   2495 ml  Net -763.4 ml    LABS: Basic Metabolic Panel:  Recent Labs  13/06/15 0345  10/18/14 0430 10/19/14 0320  NA 136*  < > 138 135*  K 3.6*  < > 3.7 4.0  CL 95*  < > 95* 93*  CO2 29  < > 30 29  GLUCOSE 219*  < > 168* 201*  BUN 30*  < > 32* 31*  CREATININE 1.51*  < > 1.71* 1.57*  CALCIUM 8.5  < > 8.7 8.6  MG 1.9  --   --   --   < > = values in this interval not displayed. Liver Function Tests: No results for input(s): AST, ALT, ALKPHOS, BILITOT, PROT, ALBUMIN in the last 72 hours. No results for input(s): LIPASE, AMYLASE in the last 72 hours. CBC:  Recent Labs  10/18/14 0800 10/19/14 0320  WBC 9.7 9.7  HGB 13.5 13.0  HCT 40.3 39.3  MCV 94.4 94.9  PLT 147* 156   Cardiac Enzymes: No results for input(s): CKTOTAL, CKMB, CKMBINDEX, TROPONINI in the last 72 hours. BNP: Invalid input(s): POCBNP D-Dimer: No results for input(s): DDIMER in the last 72 hours. Hemoglobin A1C: No results for input(s): HGBA1C in the last 72  hours. Fasting Lipid Panel: No results for input(s): CHOL, HDL, LDLCALC, TRIG, CHOLHDL, LDLDIRECT in the last 72 hours. Thyroid Function Tests: No results for input(s): TSH, T4TOTAL, T3FREE, THYROIDAB in the last 72 hours.  Invalid input(s): FREET3 Anemia Panel: No results for input(s): VITAMINB12, FOLATE, FERRITIN, TIBC, IRON, RETICCTPCT in the last 72 hours.  RADIOLOGY: Dg Chest Port 1 View  10/18/2014   CLINICAL DATA:  PICC line placement.  EXAM: PORTABLE CHEST - 1 VIEW  COMPARISON:  Earlier today.  FINDINGS: Interval right PICC with its tip in the inferior aspect of the superior vena cava. Femoral vein catheter coiled in the right atrium and extending out the pulmonary outflow tract with its tip in the proximal right main pulmonary artery. Post CABG changes. The cardiac silhouette remains borderline enlarged. No significant change in patchy opacity at the left lung base. The intra-aortic balloon pump is unchanged.  IMPRESSION:  1. Right PICC tip in the inferior aspect of the superior vena cava. If a position at the cavoatrial junction is desired, this could be advanced 1 cm. 2. Stable left basilar probable atelectasis. Pneumonia is less likely.   Electronically Signed   By: Gordan Payment M.D.   On: 10/18/2014 11:48   Dg Chest Port 1 View  10/18/2014   CLINICAL DATA:  Shortness of breath, pneumonia  EXAM: PORTABLE CHEST - 1 VIEW  COMPARISON:  Portable chest x-ray of October 18, 2014  FINDINGS: The lungs are adequately inflated. There is subsegmental atelectasis at the left lung base. The pulmonary interstitial markings are less conspicuous today. There are post CABG changes. The cardiac silhouette is top-normal in size. The pulmonary vascularity is not engorged. The intra-aortic balloon pump radiodense tip lies in the proximal descending thoracic aorta. The Swan-Ganz catheter tip projects in the right main pulmonary artery and is stable. The observed bony thorax is unremarkable.  IMPRESSION: 1. There has been mild interval improvement in the appearance of the pulmonary interstitium consistent with improving CHF. There is persistent subsegmental atelectasis at the left lung base. 2. The intra-aortic balloon pump tip is in appropriate radiographic position.   Electronically Signed   By: David  [place]   On: 10/18/2014 07:30   Dg Chest Port 1 View  10/17/2014   CLINICAL DATA:  Placement of the intra-aortic balloon pump a cyst.  EXAM: PORTABLE CHEST - 1 VIEW  COMPARISON:  10/15/2014  FINDINGS: Postoperative changes in the mediastinum. Interval placement of central venous catheter via in superior approach with tip over the left pulmonary outflow tract. No pneumothorax. Cardiac enlargement with pulmonary vascular congestion and perihilar infiltrates suggesting edema. Small bilateral pleural effusions with basilar atelectasis. Radiopaque EIA BP tip localized over the upper descending thoracic aorta.  IMPRESSION: Appliances appear to be in  satisfactory location. Cardiac enlargement with pulmonary vascular congestion, perihilar edema, and small bilateral pleural effusions.   Electronically Signed   By: Burman Nieves M.D.   On: 10/17/2014 00:09   Dg Chest Port 1 View  10/15/2014   CLINICAL DATA:  Hypoxia  EXAM: PORTABLE CHEST - 1 VIEW  COMPARISON:  Yesterday  FINDINGS: Vascular congestion and diffuse edema have developed. Upper normal heart size. No pneumothorax. Small pleural effusions are now present.  IMPRESSION: New edema and small pleural effusions.   Electronically Signed   By: Maryclare Bean M.D.   On: 10/15/2014 09:06   Dg Chest Port 1 View  10/14/2014   CLINICAL DATA:  Chest pain.  EXAM: PORTABLE CHEST - 1 VIEW  COMPARISON:  Chest x-ray 05/14/2013  FINDINGS: Prior CABG. Cardiomegaly. Pulmonary vascularity is normal. Mild infiltrate medial right lung base. No pleural effusion. Stable left pleural thickening noted consistent scarring. Degenerative changes thoracic spine both shoulders.  IMPRESSION: 1. Mild infiltrate medial right lung base. 2. Cardiomegaly.  Prior CABG.  No pulmonary venous congestion.   Electronically Signed   By: Maisie Fus  Register   On: 10/14/2014 10:10    PHYSICAL EXAM General: NAD Neck: JVP 7-8 cm, no thyromegaly or thyroid nodule.  Lungs: Crackles at bases bilaterally. CV: Nondisplaced PMI.  Heart tachy, irregular S1/S2, no S3/S4, no murmur.  Trace ankle edema.    Abdomen: Soft, nontender, no hepatosplenomegaly, no distention.  Neurologic: Alert and oriented x 3.  Psych: Alert and orient x3  Extremities: No clubbing or cyanosis.   TELEMETRY: Reviewed telemetry pt in atrial fibrillation  ASSESSMENT AND PLAN: 1. Atrial fibrillation: Chronic, likely permanent. Stop milrionone and amiodarone drip. Start digoxin.  Add Toprol XL 25 mg twice a day.  INR 1.3, Coumadin per pharmacy.  2. Acute on chronic systolic CHF with cardiogenic shock:  Echo on 11/2 showed that EF is about 25% with wall motion abnormalities.  This is down from the past. IABP placed 11/3 with cardiogenic shock and milrinone begun with persistently low co-ox.  He had very high filling pressures at RHC.  IABP and swan out 10/18/14. PICC in place with CO-OX 67.  - Stop milrinone  - Volume status ok on torsemide.  -  Holding ACEI with soft BP and elevated creatinine.  - Increase hydralazine to 25 mg tid and increase imdur to 30 mg daily.  - Add digoxin and Toprol XL 25 mg twice a day - Continue  spironolactone to 25 mg daily   3. ID: Possible PNA, fever earlier in stay. Mild WBC elevation. Procalcitonin is elevated.  I do not think this is the only process but may play a role. Influenza negative.  - Antibiotics per primary service (cefepime/vancomycin).  4. Elevated left hemidiaphragm: Chronic dyspnea. No history of smoking so doubt component of emphysema.  5. CAD: h/o CABG. NSTEMI this admission with TnI 4.9 peak. EF lower compared to 1/14, so concern for true ACS with plaque rupture rather than demand ischemia. Has chronic LBBB.No further chest pain.  Angiography as above showed that culprit was likely occlusion of SVG-PDA though this may have been subacute given some collaterals.  No good target for intervention. - Continue ASA, statin.  6. AKI: Creatinine down a little 1.7> 1.5. 7. Immobility - Consult PT and cardiac rehab.   Transfer to stepdown after Dr [name] evaluates.      CLEGG,AMY NP-C  10/19/2014 7:04 AM   Patient seen with NP, agree with the above note.  [name] is much improved today.  IABP out, BP higher and co-ox 67%.  Will stop milrinone and amiodarone gtts today, transition to digoxin and Toprol XL.  May go to stepdown, needs lots of PT work.    Marca Ancona 10/19/2014 7:38 AM

## 2014-10-20 LAB — BASIC METABOLIC PANEL
ANION GAP: 11 (ref 5–15)
BUN: 30 mg/dL — ABNORMAL HIGH (ref 6–23)
CO2: 31 mEq/L (ref 19–32)
CREATININE: 1.49 mg/dL — AB (ref 0.50–1.35)
Calcium: 9.2 mg/dL (ref 8.4–10.5)
Chloride: 94 mEq/L — ABNORMAL LOW (ref 96–112)
GFR calc non Af Amer: 44 mL/min — ABNORMAL LOW (ref >90)
GFR, EST AFRICAN AMERICAN: 51 mL/min — AB (ref >90)
Glucose, Bld: 123 mg/dL — ABNORMAL HIGH (ref 70–99)
Potassium: 4.2 mEq/L (ref 3.7–5.3)
Sodium: 136 mEq/L — ABNORMAL LOW (ref 137–147)

## 2014-10-20 LAB — CBC
HEMATOCRIT: 40.8 % (ref 39.0–52.0)
Hemoglobin: 13.7 g/dL (ref 13.0–17.0)
MCH: 32 pg (ref 26.0–34.0)
MCHC: 33.6 g/dL (ref 30.0–36.0)
MCV: 95.3 fL (ref 78.0–100.0)
PLATELETS: 177 10*3/uL (ref 150–400)
RBC: 4.28 MIL/uL (ref 4.22–5.81)
RDW: 15.2 % (ref 11.5–15.5)
WBC: 9.7 10*3/uL (ref 4.0–10.5)

## 2014-10-20 LAB — GLUCOSE, CAPILLARY
GLUCOSE-CAPILLARY: 239 mg/dL — AB (ref 70–99)
Glucose-Capillary: 119 mg/dL — ABNORMAL HIGH (ref 70–99)
Glucose-Capillary: 174 mg/dL — ABNORMAL HIGH (ref 70–99)
Glucose-Capillary: 109 mg/dL — ABNORMAL HIGH (ref 70–99)

## 2014-10-20 LAB — CARBOXYHEMOGLOBIN
Carboxyhemoglobin: 0.9 % (ref 0.5–1.5)
Methemoglobin: 1.1 % (ref 0.0–1.5)
O2 Saturation: 59.5 %
Total hemoglobin: 13.7 g/dL (ref 13.5–18.0)

## 2014-10-20 LAB — PROTIME-INR
INR: 1.5 — AB (ref 0.00–1.49)
Prothrombin Time: 18.3 seconds — ABNORMAL HIGH (ref 11.6–15.2)

## 2014-10-20 MED ORDER — WARFARIN SODIUM 2.5 MG PO TABS
12.5000 mg | ORAL_TABLET | Freq: Once | ORAL | Status: AC
  Administered 2014-10-20: 12.5 mg via ORAL
  Filled 2014-10-20: qty 1

## 2014-10-20 MED ORDER — TORSEMIDE 20 MG PO TABS
40.0000 mg | ORAL_TABLET | Freq: Every day | ORAL | Status: DC
  Administered 2014-10-21: 40 mg via ORAL
  Administered 2014-10-22: 20 mg via ORAL
  Administered 2014-10-23: 40 mg via ORAL
  Filled 2014-10-20 (×4): qty 2

## 2014-10-20 MED ORDER — RANOLAZINE ER 500 MG PO TB12
500.0000 mg | ORAL_TABLET | Freq: Two times a day (BID) | ORAL | Status: DC
  Filled 2014-10-20 (×6): qty 1

## 2014-10-20 MED ORDER — RANITIDINE HCL 150 MG/10ML PO SYRP
150.0000 mg | ORAL_SOLUTION | Freq: Two times a day (BID) | ORAL | Status: DC
  Administered 2014-10-20 – 2014-10-23 (×7): 150 mg via ORAL
  Filled 2014-10-20 (×8): qty 10

## 2014-10-20 MED ORDER — INSULIN ASPART 100 UNIT/ML ~~LOC~~ SOLN
3.0000 [IU] | Freq: Three times a day (TID) | SUBCUTANEOUS | Status: DC
  Administered 2014-10-20 – 2014-10-23 (×10): 3 [IU] via SUBCUTANEOUS

## 2014-10-20 NOTE — Progress Notes (Signed)
Patient ID: [name], male   DOB: 02-Oct-1941, 73 y.o.   MRN: [id number]  [facility] PROGRESS NOTE  [name]:[id number] DOB: December 18, 1940 DOA: 10/14/2014 PCP: Thayer Headings, MD  Brief narrative: 73 year old WM ischemic cardiomyopathy, known CAD involving bypass graft currently on medical therapy, mild systolic dysfunction with an EF of 40-45%, and diabetes who presented to ED with main concern of 4 days duration of progressively worsening shortness of breath, associated with non productive cough, episodes of chest discomfort with coughing spells, worse with exertion and occasionally improved with rest.   Evaluation in the ED revealed WBC 12,000, chest x-ray with mild interstitial infiltrates in the right base. It was felt his presenting symptoms were likely related to community-acquired pneumonia and he was started on empiric antibiotic therapy. There was some concern he may have mild heart failure exacerbation related to the acute illness noting his proBNP was 2372 but this was not much higher than his apparent baseline which is between 2300 and 2400.  After admission patient developed atrial fibrillation with rapid ventricular response and was subsequently started on IV Cardizem drip. He later developed a rectal temp of 102.5 rectally. Subsequently his antibiotics were broadened.  By 11/2 patient continued to require high levels of oxygen at 6 L/m. His ventricular rate was better controlled in the 70s and the Cardizem was weaned and discontinued. SBP remained soft in 90's.[name] His creatinine had increased. He was also noted to have LLE edema +2, RLE edema  +1, bibasilar crackles on lung exam. CXR c/w pulmonary edema so Lasix was started.  On 11/3 patient became unstable from a cardiac standpoint as evidenced by persistent A. Fib with RVR and chest pain. He was taken to the Cath Lab where a IABP was placed; Swan-Ganz catheter was also placed and the patient was subsequently  sent to the ICU. He was started on IV amiodarone for rate control and a milrinone infusion to help with his cardiogenic shock. He was also initiated on a Lasix infusion. Catheterization did reveal a likely culprit lesion being occlusion of the SVG-PDA which cardiology feels may have been subacute given the fact he has collaterals. Again no good target lesion was identified for intervention.  The IABP was dc'd 11/5 and Milrinone weaning initiated. By 11/6 Milrinone was dc'd and IV Amiodarone dc'd in favor of metoprolol and digoxin. His Lasix drip had previously been transitioned to PO Demadex 11/5. Cardiology transferred him to SDU.   Assessment and Plan:   Atrial fibrillation with RVR - chronic and likely permanent - still not well controlled as rate is in 110 - 120's this AM - appreciate cardiology team following - milrinone and amiodarone drip started - currently on digoxin, Toprol added 25 mg PO BID  - continue Coumadin per pharmacy  Acute on chronic systolic CHF with cardiogenic shock:  - ECHO 11/2 showed that EF is about 25% with wall motion abnormalities. This is down from the past - IABP placed 11/3 with cardiogenic shock and milrinone begun with persistently low co-ox - pt had very high filling pressures at RHC. IABP and swan out 10/18/14. PICC in place with CO-OX 60 - pt is off lasix drip - torsemide increased to 40 mg PO BID per cardiology recommendations  - continue to hold ACEI until BP and Cr stabilizes  HTN - continue Toprol, Torsemide as noted above  - Increased hydralazine to 25 mg tid and increased imdur to 30 mg daily.  - Continue spironolactone to 25 mg daily  Acute on chronic respiratory failure secondary to ? PNA, noted on initial CXR, imposed on new a-fib with RVR, chronic left hemidiaphragm paralysis, systolic CHF - vancomycin and maxipime completed as noted below  - pt maintaining oxygen saturations at target range  CAD - h/o CABG.  - NSTEMI this admission  with TnI 4.9 peak.  - EF lower compared to 1/14, so concern for true ACS with plaque rupture rather than demand ischemia. Has chronic LBBB - no chest pain this AM - angio with likely occlusion of SVG-PDA though this may have been subacute given some collaterals. - No good target for intervention per cardiology  - Continue ASA, statin.  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  Medical Consultants:   Cardiology   Other Consultants:   Physical therapy  OT Anti-Infectives:    Cefepime 11/1 --.> stopped 11/  Vancomycin 11/1 --> 11/4  Debbora Presto, MD  [facility] Pager [phone]  If 7PM-7AM, please contact night-coverage [email or web address] Password [email or web address] 10/20/2014, 1:18 PM   LOS: 6 days   HPI/Subjective: No events overnight.   Objective: Filed Vitals:   10/20/14 0900 10/20/14 1000 10/20/14 1100 10/20/14 1200  BP: 137/96 118/67 97/69 122/88  Pulse: 102 94 93 72  Temp:    97.5 F (36.4 C)  TempSrc:    Oral  Resp:   24 21  Height:      Weight:      SpO2: 97% 98% 100% 99%    Intake/Output Summary (Last 24 hours) at 10/20/14 1318 Last data filed at 10/20/14 1300  Gross per 24 hour  Intake    560 ml  Output   1400 ml  Net   -840 ml    Exam:   General:  Pt is alert, follows commands appropriately, not in acute distress  Cardiovascular: Irregular rate and rhythm, no rubs, no gallops  Respiratory: Clear to auscultation bilaterally, diminished breath  sounds at bases   Abdomen: Soft, non tender, non distended, bowel sounds present, no guarding   Data Reviewed: Basic Metabolic Panel:  Recent Labs Lab 10/17/14 0345 10/17/14 1415 10/18/14 0430 10/19/14 0320 10/20/14 0451  NA 136* 128* 138 135* 136*  K 3.6* 4.1 3.7 4.0 4.2  CL 95* 89* 95* 93* 94*  CO2 29 28 30 29 31   GLUCOSE 219* 301* 168* 201* 123*  BUN 30* 29* 32* 31* 30*  CREATININE 1.51* 1.52* 1.71* 1.57* 1.49*  CALCIUM 8.5 8.3* 8.7 8.6 9.2  MG 1.9  --   --   --   --    Liver Function Tests:  Recent Labs Lab 10/14/14 0944  AST 22  ALT 46  ALKPHOS 85  BILITOT 0.9  PROT 6.6  ALBUMIN 3.3*   CBC:  Recent Labs Lab 10/14/14 0944  10/16/14 0053 10/17/14 0345 10/18/14 0800 10/19/14 0320 10/20/14 0451  WBC 12.7*  < > 13.0* 11.6* 9.7 9.7 9.7  NEUTROABS 10.6*  --   --   --   --   --   --  HGB 15.3  < > 14.3 13.1 13.5 13.0 13.7  HCT 45.6  < > 42.9 39.6 40.3 39.3 40.8  MCV 95.4  < > 94.3 94.3 94.4 94.9 95.3  PLT 145*  < > 146* 150 147* 156 177  < > = values in this interval not displayed. Cardiac Enzymes:  Recent Labs Lab 10/15/14 1332 10/15/14 1947 10/16/14 0053  TROPONINI 4.91* 4.44* 2.95*   BNP: Invalid input(s): POCBNP CBG:  Recent Labs Lab 10/19/14 1122 10/19/14 1637 10/19/14 2112 10/20/14 0721 10/20/14 1208  GLUCAP 291* 204* 205* 119* 239*    Recent Results (from the past 240 hour(s))  Culture, blood (routine x 2) Call MD if unable to obtain prior to antibiotics being given     Status: None (Preliminary result)   Collection Time: 10/14/14  1:25 PM  Result Value Ref Range Status   Specimen Description BLOOD RIGHT HAND  Final   Special Requests BOTTLES DRAWN AEROBIC AND ANAEROBIC 10CC  Final   Culture  Setup Time   Final    10/15/2014 02:25 Performed at [facility]    Culture   Final           BLOOD CULTURE RECEIVED NO GROWTH TO DATE CULTURE WILL BE HELD FOR 5 DAYS BEFORE ISSUING A FINAL NEGATIVE REPORT Performed at [facility]    Report Status PENDING  Incomplete  Culture, blood (routine x 2) Call MD if unable to obtain prior to antibiotics being given     Status: None (Preliminary result)   Collection Time: 10/14/14  1:29 PM  Result Value Ref Range Status   Specimen Description BLOOD LEFT HAND  Final   Special Requests BOTTLES DRAWN AEROBIC AND ANAEROBIC 10CC  Final   Culture  Setup Time   Final    10/15/2014 02:25 Performed at [facility]    Culture   Final           BLOOD CULTURE RECEIVED NO GROWTH TO DATE CULTURE WILL BE HELD FOR 5 DAYS BEFORE ISSUING A FINAL NEGATIVE REPORT Performed at [facility]    Report Status PENDING  Incomplete  MRSA PCR Screening     Status: None   Collection Time: 10/14/14  6:37 PM  Result Value Ref Range Status   MRSA by PCR NEGATIVE NEGATIVE Final    Comment:        The GeneXpert MRSA Assay (FDA approved for NASAL specimens only), is one component of a comprehensive MRSA colonization surveillance program. It is not intended to diagnose MRSA infection nor to guide or monitor treatment for MRSA infections.      Scheduled Meds: . antiseptic oral rinse  7 mL Mouth Rinse BID  . aspirin EC  81 mg Oral Daily  . fluticasone  2 spray Each Nare Daily  . hydrALAZINE  25 mg Oral 3 times per day  . insulin aspart  0-20 Units Subcutaneous TID WC  . insulin aspart  0-5 Units Subcutaneous QHS  . insulin aspart  3 Units Subcutaneous TID WC  . insulin glargine  37 Units Subcutaneous Daily  . isosorbide mononitrate  30 mg Oral Daily  . levothyroxine  175 mcg Oral Daily  . linagliptin  5 mg Oral Daily  . metoprolol succinate  25 mg Oral BID  . pantoprazole  40 mg Oral BID  . predniSONE  10 mg Oral Q breakfast  . ranitidine  150 mg Oral BID  . ranolazine  500 mg Oral BID  .  rosuvastatin  20 mg Oral Daily  . sodium chloride  10-40 mL Intracatheter Q12H  . spironolactone  25 mg Oral Daily  . torsemide  40 mg Oral Daily  . warfarin  12.5 mg Oral  ONCE-1800  . Warfarin - Pharmacist Dosing Inpatient   Does not apply q1800   Continuous Infusions: . sodium chloride Stopped (10/18/14 1500)

## 2014-10-20 NOTE — Progress Notes (Addendum)
Patient ID: [name], male   DOB: Oct 11, 1941, 73 y.o.   MRN: [id number]     SUBJECTIVE:  IABP and swan removed on 10/18/2014. PICC placed. CO-OX  60%, CVP 10. Lasix drip stopped. Hydralazine/imdur added and spiro increased to 25 mg daily.   He complains of chest pain today, that happened after eating breakfast, relieved by NTG.   LHC/RHC (11/3): Patent LIMA-LAD and SVG-OM TO SVG-PDA (likely culprit) with TO PDA and 60% proximal RCA.  There were some left=>PDA collaterals  CVP 9 Co-ox 60%  Scheduled Meds: . antiseptic oral rinse  7 mL Mouth Rinse BID  . aspirin EC  81 mg Oral Daily  . fluticasone  2 spray Each Nare Daily  . hydrALAZINE  25 mg Oral 3 times per day  . insulin aspart  0-20 Units Subcutaneous TID WC  . insulin aspart  0-5 Units Subcutaneous QHS  . insulin aspart  3 Units Subcutaneous TID WC  . insulin glargine  37 Units Subcutaneous Daily  . isosorbide mononitrate  30 mg Oral Daily  . levothyroxine  175 mcg Oral Daily  . linagliptin  5 mg Oral Daily  . metoprolol succinate  25 mg Oral BID  . pantoprazole  40 mg Oral BID  . predniSONE  10 mg Oral Q breakfast  . rosuvastatin  20 mg Oral Daily  . sodium chloride  10-40 mL Intracatheter Q12H  . spironolactone  25 mg Oral Daily  . torsemide  40 mg Oral Daily  . warfarin  12.5 mg Oral ONCE-1800  . Warfarin - Pharmacist Dosing Inpatient   Does not apply q1800   Continuous Infusions: . sodium chloride Stopped (10/18/14 1500)   PRN Meds:.acetaminophen **OR** acetaminophen, HYDROcodone-acetaminophen, nitroGLYCERIN, ondansetron **OR** ondansetron (ZOFRAN) IV, polyethylene glycol, sodium chloride   Filed Vitals:   10/20/14 0553 10/20/14 0600 10/20/14 0700 10/20/14 0800  BP: 135/99 135/99 121/86   Pulse:  110 92   Temp:   97.2 F (36.2 C)   TempSrc:   Oral   Resp:   18 18  Height:      Weight:  216 lb 0.8 oz (98 kg)    SpO2:  97% 97%     Intake/Output Summary (Last 24 hours) at 10/20/14 0952 Last data  filed at 10/20/14 0800  Gross per 24 hour  Intake    935 ml  Output    950 ml  Net    -15 ml   LABS: Basic Metabolic Panel:  Recent Labs  09/32/67 0320 10/20/14 0451  NA 135* 136*  K 4.0 4.2  CL 93* 94*  CO2 29 31  GLUCOSE 201* 123*  BUN 31* 30*  CREATININE 1.57* 1.49*  CALCIUM 8.6 9.2   CBC:  Recent Labs  10/19/14 0320 10/20/14 0451  WBC 9.7 9.7  HGB 13.0 13.7  HCT 39.3 40.8  MCV 94.9 95.3  PLT 156 177   RADIOLOGY: Dg Chest Port 1 View  10/18/2014   CLINICAL DATA:  PICC line placement.  EXAM: PORTABLE CHEST - 1 VIEW  COMPARISON:  Earlier today.  FINDINGS: Interval right PICC with its tip in the inferior aspect of the superior vena cava. Femoral vein catheter coiled in the right atrium and extending out the pulmonary outflow tract with its tip in the proximal right main pulmonary artery. Post CABG changes. The cardiac silhouette remains borderline enlarged. No significant change in patchy opacity at the left lung base. The intra-aortic balloon pump is unchanged.  IMPRESSION: 1. Right PICC tip  in the inferior aspect of the superior vena cava. If a position at the cavoatrial junction is desired, this could be advanced 1 cm. 2. Stable left basilar probable atelectasis. Pneumonia is less likely.   Electronically Signed   By: Gordan Payment M.D.   On: 10/18/2014 11:48   PHYSICAL EXAM General: NAD Neck: JVP 7-8 cm, no thyromegaly or thyroid nodule.  Lungs: Crackles at bases bilaterally. CV: Nondisplaced PMI.  Heart tachy, irregular S1/S2, no S3/S4, no murmur.  Trace ankle edema.    Abdomen: Soft, nontender, no hepatosplenomegaly, no distention.  Neurologic: Alert and oriented x 3.  Psych: Alert and orient x3  Extremities: No clubbing or cyanosis.   TELEMETRY: Reviewed telemetry pt in atrial fibrillation, multiple ns VTs - with 3-7 beats     ASSESSMENT AND PLAN:  1. Atrial fibrillation: Chronic, likely permanent. rate still not controlled 90-105. Stopped milrinone and  amiodarone drip. Started digoxin.  Added Toprol XL 25 mg twice a day, we will increase to 50 mg po daily.  INR 1.3, Coumadin per pharmacy.   2. Acute on chronic systolic CHF with cardiogenic shock:  Echo on 11/2 showed that EF is about 25% with wall motion abnormalities. This is down from the past. IABP placed 11/3 with cardiogenic shock and milrinone begun with persistently low co-ox.  He had very high filling pressures at RHC.  IABP and swan out 10/18/14. PICC in place with CO-OX 60.  - fluid overloaded on torsemide only, off lasix drip, will increase torsemide 40 mg po BID  - Holding ACEI with soft BP and elevated creatinine (improving).  - Increased hydralazine to 25 mg tid and increase imdur to 30 mg daily.  - continue digoxin and increase Toprol XL 25 mg twice a day - Continue  spironolactone to 25 mg daily    3. nsVT - 3-7 beats - increase Toprol XL to 50 mg po daily  4. ID: Possible PNA, fever earlier in stay. Mild WBC elevation. Procalcitonin is elevated.  I do not think this is the only process but may play a role. Influenza negative.  - Antibiotics per primary service (cefepime/vancomycin).   5. Elevated left hemidiaphragm: Chronic dyspnea. No history of smoking so doubt component of emphysema.   6. CAD: h/o CABG. NSTEMI this admission with TnI 4.9 peak. EF lower compared to 1/14, so concern for true ACS with plaque rupture rather than demand ischemia. Has chronic LBBB.No further chest pain.  Angiography as above showed that culprit was likely occlusion of SVG-PDA though this may have been subacute given some collaterals.  No good target for intervention. - Continue ASA, statin.  - continues to have chest pain - added Ranexa, PRN NTG as hypotensive  6. AKI: Creatinine down a little 1.7 > 1.5 > 1.4.  7. Immobility - Consult PT and cardiac rehab.   Keep in CCU one more day, possibly to stepdown tomorrow     Lars Masson, MD 10/20/2014 9:52 AM

## 2014-10-20 NOTE — Progress Notes (Signed)
ANTICOAGULATION CONSULT NOTE - Follow Up Consult  Pharmacy Consult for Coumadin Indication: atrial fibrillation   Allergies  Allergen Reactions  . Butrans [Buprenorphine] Shortness Of Breath  . Clarithromycin Other (See Comments)    Unknown, thinks "blisters in mouth"  . Influenza Vaccines Other (See Comments)    Severe coughing for weeks after vaccine; also had flu like symptoms for about 7 to 8 weeks.   . Lanoxin [Digoxin] Other (See Comments)    Nausea, anorexia  . Lisinopril Other (See Comments)    cough  . Ranexa [Ranolazine Er]   . Statins     Patient Measurements: Height: 6' (182.9 cm) Weight: 216 lb 0.8 oz (98 kg) (standing weight) IBW/kg (Calculated) : 77.6  Vital Signs: Temp: 97.2 F (36.2 C) (11/07 0700) Temp Source: Oral (11/07 0700) BP: 121/86 mmHg (11/07 0700) Pulse Rate: 92 (11/07 0700)  Labs:  Recent Labs  10/17/14 1400  10/17/14 2250 10/18/14 0430  10/18/14 0800 10/19/14 0320 10/20/14 0451  HGB  --   --   --   --   < > 13.5 13.0 13.7  HCT  --   --   --   --   --  40.3 39.3 40.8  PLT  --   --   --   --   --  147* 156 177  LABPROT  --   --   --  18.4*  --   --  16.4* 18.3*  INR  --   --   --  1.52*  --   --  1.31 1.50*  HEPARINUNFRC <0.10*  --  0.28* 0.35  --   --   --   --   CREATININE  --   < >  --  1.71*  --   --  1.57* 1.49*  < > = values in this interval not displayed.  Estimated Creatinine Clearance: 52 mL/min (by C-G formula based on Cr of 1.49).  Assessment: 73 yo male on coumadin pta for afib. Coumadin held on admission for cath and heparin bridge started. Heparin was resumed post-cath as patient needed an IABP. IABP was pulled yesterday, heparin discontinued, and coumadin resumed. INR below goal at 1.5. Amiodarone drip was started on admission but discontinued yesterday.  Goal of Therapy:  INR 2-3 Monitor platelets by anticoagulation protocol: Yes    Plan:  1) Coumadin 12.5 mg x 1 2) INR in AM    Agapito Games, PharmD,  BCPS Clinical Pharmacist Pager: [phone] 10/20/2014 7:41 AM

## 2014-10-20 NOTE — Progress Notes (Signed)
Pt c/o chest pain.  States he has had chest pain after breakfast for the past couple of days and that it happens sometimes at home.  States it is pain, not indigestion.  12 lead ekg obtained and 1 sl nitro given.  Dr. Delton See at bedside and notified.  Pain was relieved with nitro.  New orders received from Dr. Delton See.  Will continue to monitor.

## 2014-10-21 LAB — CBC
HEMATOCRIT: 41.9 % (ref 39.0–52.0)
Hemoglobin: 13.6 g/dL (ref 13.0–17.0)
MCH: 31.2 pg (ref 26.0–34.0)
MCHC: 32.5 g/dL (ref 30.0–36.0)
MCV: 96.1 fL (ref 78.0–100.0)
Platelets: 199 10*3/uL (ref 150–400)
RBC: 4.36 MIL/uL (ref 4.22–5.81)
RDW: 15.2 % (ref 11.5–15.5)
WBC: 10.6 10*3/uL — ABNORMAL HIGH (ref 4.0–10.5)

## 2014-10-21 LAB — BASIC METABOLIC PANEL
Anion gap: 10 (ref 5–15)
BUN: 30 mg/dL — AB (ref 6–23)
CALCIUM: 9 mg/dL (ref 8.4–10.5)
CO2: 31 mEq/L (ref 19–32)
CREATININE: 1.44 mg/dL — AB (ref 0.50–1.35)
Chloride: 97 mEq/L (ref 96–112)
GFR calc Af Amer: 53 mL/min — ABNORMAL LOW (ref >90)
GFR, EST NON AFRICAN AMERICAN: 46 mL/min — AB (ref >90)
Glucose, Bld: 100 mg/dL — ABNORMAL HIGH (ref 70–99)
POTASSIUM: 4.2 meq/L (ref 3.7–5.3)
Sodium: 138 mEq/L (ref 137–147)

## 2014-10-21 LAB — CARBOXYHEMOGLOBIN
CARBOXYHEMOGLOBIN: 0.8 % (ref 0.5–1.5)
METHEMOGLOBIN: 1.1 % (ref 0.0–1.5)
O2 Saturation: 54.9 %
Total hemoglobin: 13.8 g/dL (ref 13.5–18.0)

## 2014-10-21 LAB — GLUCOSE, CAPILLARY
Glucose-Capillary: 201 mg/dL — ABNORMAL HIGH (ref 70–99)
Glucose-Capillary: 203 mg/dL — ABNORMAL HIGH (ref 70–99)
Glucose-Capillary: 158 mg/dL — ABNORMAL HIGH (ref 70–99)

## 2014-10-21 LAB — PROTIME-INR
INR: 1.74 — ABNORMAL HIGH (ref 0.00–1.49)
Prothrombin Time: 20.5 seconds — ABNORMAL HIGH (ref 11.6–15.2)

## 2014-10-21 MED ORDER — WARFARIN SODIUM 2.5 MG PO TABS
12.5000 mg | ORAL_TABLET | Freq: Once | ORAL | Status: AC
  Administered 2014-10-21: 12.5 mg via ORAL
  Filled 2014-10-21: qty 1

## 2014-10-21 NOTE — Progress Notes (Signed)
Physical Therapy Note  SATURATION QUALIFICATIONS: (This note is used to comply with regulatory documentation for home oxygen)  Patient Saturations on Room Air at Rest = 91%  Patient Saturations on Room Air while Ambulating = 84%  Patient Saturations on 3 Liters of oxygen while Ambulating = 92%  Please briefly explain why patient needs home oxygen: Patient requires supplemental oxygen to maintain oxygen saturations at acceptable, safe levels with physical activity.   Thanks,   Van Clines, [place]  Ac[facility] Services Pager [phone] Office [phone]

## 2014-10-21 NOTE — Progress Notes (Addendum)
Patient ID: [name], male   DOB: 1941-03-05, 73 y.o.   MRN: [id number] [facility] PROGRESS NOTE  COLBE MCDONNEL YVO:[id number] DOB: 09/11/1941 DOA: 10/14/2014 PCP: Thayer Headings, MD  Brief narrative: 73 year old WM ischemic cardiomyopathy, known CAD involving bypass graft currently on medical therapy, mild systolic dysfunction with an EF of 40-45%, and diabetes who presented to ED with main concern of 4 days duration of progressively worsening shortness of breath, associated with non productive cough, episodes of chest discomfort with coughing spells, worse with exertion and occasionally improved with rest.   Evaluation in the ED revealed WBC 12,000, chest x-ray with mild interstitial infiltrates in the right base. It was felt his presenting symptoms were likely related to community-acquired pneumonia and he was started on empiric antibiotic therapy. There was some concern he may have mild heart failure exacerbation related to the acute illness noting his proBNP was 2372 but this was not much higher than his apparent baseline which is between 2300 and 2400.  After admission patient developed atrial fibrillation with rapid ventricular response and was subsequently started on IV Cardizem drip. He later developed a rectal temp of 102.5 rectally. Subsequently his antibiotics were broadened.  By 11/2 patient continued to require high levels of oxygen at 6 L/m. His ventricular rate was better controlled in the 70s and the Cardizem was weaned and discontinued. SBP remained soft in 90's.[name] His creatinine had increased. He was also noted to have LLE edema +2, RLE edema +1, bibasilar crackles on lung exam. CXR c/w pulmonary edema so Lasix was started.  On 11/3 patient became unstable from a cardiac standpoint as evidenced by persistent A. Fib with RVR and chest pain. He was taken to the Cath Lab where a IABP was placed; Swan-Ganz catheter was also placed and the patient was subsequently sent  to the ICU. He was started on IV amiodarone for rate control and a milrinone infusion to help with his cardiogenic shock. He was also initiated on a Lasix infusion. Catheterization did reveal a likely culprit lesion being occlusion of the SVG-PDA which cardiology feels may have been subacute given the fact he has collaterals. Again no good target lesion was identified for intervention.  The IABP was dc'd 11/5 and Milrinone weaning initiated. By 11/6 Milrinone was dc'd and IV Amiodarone dc'd in favor of metoprolol and digoxin. His Lasix drip had previously been transitioned to PO Demadex 11/5. Cardiology transferred him to SDU.   Assessment/Plan:    Atrial fibrillation with RVR - chronic and likely permanent; pt was on milrinone, amiodarone drip but this now stopped 10/19/14. Then started digoxin although no order in [facility] for digoxin seen today. Will see with cardio if they planned to hold digoxin.  - HR 99 this morning - continue metoprolol 25 mg PO BID - appreciate cardiology team following - continue Coumadin per pharmacy  Acute on chronic systolic CHF with cardiogenic shock:  - ECHO 11/2 showed that EF is about 25% with wall motion abnormalities. This is down from the past - IABP placed 11/3 with cardiogenic shock and milrinone begun with persistently low co-ox - pt had very high filling pressures at RHC. IABP and swan out 10/18/14. PICC in place with CO-OX 60 - pt is off lasix drip, of milrinone  - torsemide increased to 40 mg PO BID per cardiology recommendations  - continue spironolactone 25 mg daily  - continue to hold ACEI until BP and Cr stabilizes  Essential hypertension  - continue Toprol, Torsemide as noted  above  - continuehydralazine  25 mg tid and increased imdur to 30 mg daily.  - Continue spironolactone to 25 mg daily  Acute on chronic respiratory failure secondary to ? PNA, noted on initial CXR, imposed on new a-fib with RVR, chronic left hemidiaphragm paralysis,  systolic CHF - vancomycin and maxipime completed as noted below  - respiratory status stable  CAD / NSTEMI - h/o CABG.  - NSTEMI this admission with TnI 4.9 peak.  - EF lower compared to 1/14, so concern for true ACS with plaque rupture rather than demand ischemia. Has chronic LBBB - angio with likely occlusion of SVG-PDA though this may have been subacute given some collaterals. - No good target for intervention per cardiology  - Continue ASA, statin.  - continue ranexa, nitroglycerin PRN Acute on chronic renal failure, stage II  - Creatinine trending down: 1.7 > 1.57 > 1.44 - follow daily BMP Acute functional quadriplegia - mobilize - PT/OT evaluation  Chronic anticoagulation - coumadin per pharmacy  Diabetes mellitus, uncontrolled with complications of renal disease  - Cont Lantus, Tradjenta and resistant SSI - hemoglobin A1c 8.6 indicating poor glycemic control  Hypothyroidism - continue synthroid   DVT prophylaxis  Coumadin, INR 1.74 this AM   Code Status: Full.  Family Communication:  Family not at the bedside this morning  Disposition Plan: remains in SDU  IV access:   PeripheralIV  Procedures and diagnostic studies:    No results found.  Medical Consultants:   Cardiology  Other Consultants:   PT  OT IAnti-Infectives:    Cefepime 11/1 --.> stopped 11/5  Vancomycin 11/1 --> 11/4   Manson Passey, MD  [facility] Pager [phone]  If 7PM-7AM, please contact night-coverage [email or web address] Password [email or web address] 10/21/2014, 1:26 PM   LOS: 7 days    HPI/Subjective: No acute overnight events.  Objective: Filed Vitals:   10/21/14 0800 10/21/14 0900 10/21/14 1000 10/21/14 1200  BP: 78/49 77/41 105/73 106/58  Pulse: 81 82 50 99  Temp:      TempSrc:      Resp: 30 19 25 12   Height:      Weight:      SpO2: 97% 96% 96% 95%    Intake/Output Summary (Last 24 hours) at 10/21/14 1326 Last data filed at 10/21/14 1200  Gross per 24 hour   Intake    705 ml  Output   2575 ml  Net  -1870 ml    Exam:   General:  Pt is not in acute distress  Cardiovascular: irregular and tachycardic, S1/S2 appreciated   Respiratory: bibasilar crackles, no wheezing   Abdomen: Soft, non tender, non distended, bowel sounds present  Extremities: pulses DP and PT palpable bilaterally  Neuro: Grossly nonfocal  Data Reviewed: Basic Metabolic Panel:  Recent Labs Lab 10/17/14 0345 10/17/14 1415 10/18/14 0430 10/19/14 0320 10/20/14 0451 10/21/14 0430  NA 136* 128* 138 135* 136* 138  K 3.6* 4.1 3.7 4.0 4.2 4.2  CL 95* 89* 95* 93* 94* 97  CO2 29 28 30 29 31 31   GLUCOSE 219* 301* 168* 201* 123* 100*  BUN 30* 29* 32* 31* 30* 30*  CREATININE 1.51* 1.52* 1.71* 1.57* 1.49* 1.44*  CALCIUM 8.5 8.3* 8.7 8.6 9.2 9.0  MG 1.9  --   --   --   --   --    Liver Function Tests: No results for input(s): AST, ALT, ALKPHOS, BILITOT, PROT, ALBUMIN in the last 168 hours. No results for input(s): LIPASE, AMYLASE in the  last 168 hours. No results for input(s): AMMONIA in the last 168 hours. CBC:  Recent Labs Lab 10/17/14 0345 10/18/14 0800 10/19/14 0320 10/20/14 0451 10/21/14 0430  WBC 11.6* 9.7 9.7 9.7 10.6*  HGB 13.1 13.5 13.0 13.7 13.6  HCT 39.6 40.3 39.3 40.8 41.9  MCV 94.3 94.4 94.9 95.3 96.1  PLT 150 147* 156 177 199   Cardiac Enzymes:  Recent Labs Lab 10/15/14 1332 10/15/14 1947 10/16/14 0053  TROPONINI 4.91* 4.44* 2.95*   BNP: Invalid input(s): POCBNP CBG:  Recent Labs Lab 10/20/14 0721 10/20/14 1208 10/20/14 1731 10/20/14 2159 10/21/14 1233  GLUCAP 119* 239* 174* 109* 201*    Recent Results (from the past 240 hour(s))  Culture, blood (routine x 2) Call MD if unable to obtain prior to antibiotics being given     Status: None (Preliminary result)   Collection Time: 10/14/14  1:25 PM  Result Value Ref Range Status   Specimen Description BLOOD RIGHT HAND  Final   Special Requests BOTTLES DRAWN AEROBIC AND  ANAEROBIC 10CC  Final   Culture  Setup Time   Final    10/15/2014 02:25 Performed at [facility]    Culture   Final           BLOOD CULTURE RECEIVED NO GROWTH TO DATE CULTURE WILL BE HELD FOR 5 DAYS BEFORE ISSUING A FINAL NEGATIVE REPORT Performed at [facility]    Report Status PENDING  Incomplete  Culture, blood (routine x 2) Call MD if unable to obtain prior to antibiotics being given     Status: None (Preliminary result)   Collection Time: 10/14/14  1:29 PM  Result Value Ref Range Status   Specimen Description BLOOD LEFT HAND  Final   Special Requests BOTTLES DRAWN AEROBIC AND ANAEROBIC 10CC  Final   Culture  Setup Time   Final    10/15/2014 02:25 Performed at [facility]    Culture   Final           BLOOD CULTURE RECEIVED NO GROWTH TO DATE CULTURE WILL BE HELD FOR 5 DAYS BEFORE ISSUING A FINAL NEGATIVE REPORT Performed at [facility]    Report Status PENDING  Incomplete  MRSA PCR Screening     Status: None   Collection Time: 10/14/14  6:37 PM  Result Value Ref Range Status   MRSA by PCR NEGATIVE NEGATIVE Final    Comment:        The GeneXpert MRSA Assay (FDA approved for NASAL specimens only), is one component of a comprehensive MRSA colonization surveillance program. It is not intended to diagnose MRSA infection nor to guide or monitor treatment for MRSA infections.      Scheduled Meds: . antiseptic oral rinse  7 mL Mouth Rinse BID  . aspirin EC  81 mg Oral Daily  . fluticasone  2 spray Each Nare Daily  . hydrALAZINE  25 mg Oral 3 times per day  . insulin aspart  0-20 Units Subcutaneous TID WC  . insulin aspart  0-5 Units Subcutaneous QHS  . insulin aspart  3 Units Subcutaneous TID WC  . insulin glargine  37 Units Subcutaneous Daily  . isosorbide mononitrate  30 mg Oral Daily  . levothyroxine  175 mcg Oral Daily  . linagliptin  5 mg Oral Daily  . metoprolol succinate  25 mg Oral BID  . pantoprazole  40 mg Oral BID  .  predniSONE  10 mg Oral Q breakfast  . ranitidine  150  mg Oral BID  . ranolazine  500 mg Oral BID  . rosuvastatin  20 mg Oral Daily  . sodium chloride  10-40 mL Intracatheter Q12H  . spironolactone  25 mg Oral Daily  . torsemide  40 mg Oral Daily  . Warfarin - Pharmacist Dosing Inpatient   Does not apply q1800   Continuous Infusions: . sodium chloride Stopped (10/18/14 1500)

## 2014-10-21 NOTE — Progress Notes (Signed)
ANTICOAGULATION CONSULT NOTE - Follow Up Consult  Pharmacy Consult for Coumadin Indication: atrial fibrillation   Allergies  Allergen Reactions  . Butrans [Buprenorphine] Shortness Of Breath  . Clarithromycin Other (See Comments)    Unknown, thinks "blisters in mouth"  . Influenza Vaccines Other (See Comments)    Severe coughing for weeks after vaccine; also had flu like symptoms for about 7 to 8 weeks.   . Lanoxin [Digoxin] Other (See Comments)    Nausea, anorexia  . Lisinopril Other (See Comments)    cough  . Ranexa [Ranolazine Er]   . Statins     Patient Measurements: Height: 6' (182.9 cm) Weight: 216 lb 0.8 oz (98 kg) IBW/kg (Calculated) : 77.6  Vital Signs: Temp: 97.9 F (36.6 C) (11/08 0700) Temp Source: Oral (11/08 0700) BP: 106/58 mmHg (11/08 1200) Pulse Rate: 99 (11/08 1200)  Labs:  Recent Labs  10/19/14 0320 10/20/14 0451 10/21/14 0430  HGB 13.0 13.7 13.6  HCT 39.3 40.8 41.9  PLT 156 177 199  LABPROT 16.4* 18.3* 20.5*  INR 1.31 1.50* 1.74*  CREATININE 1.57* 1.49* 1.44*    Estimated Creatinine Clearance: 53.8 mL/min (by C-G formula based on Cr of 1.44).  Assessment: 73 yo [name] on coumadin pta for afib. Coumadin held on admission for cath and heparin bridge started. Heparin was resumed post-cath as patient needed an IABP. IABP was pulled 11/6, heparin discontinued, and coumadin resumed. INR is subtherapeutic but increasing appropriately. Amiodarone drip was started on admission but discontinued 11/6.  Goal of Therapy:  INR 2-3 Monitor platelets by anticoagulation protocol: Yes    Plan:  1) Coumadin 12.5 mg x 1 2) Daily INR    Agapito Games, PharmD, BCPS Clinical Pharmacist Pager: [phone] 10/21/2014 1:45 PM

## 2014-10-21 NOTE — Evaluation (Signed)
Physical Therapy Evaluation Patient Details Name: [name] MRN: [id number] DOB: 09-13-1941 Today's Date: 10/21/2014   History of Present Illness  Admitted with shortness of breath, CAP; developed afib with RVR; cardiac status became unstable went to cardiac Cath and IABP placed, occlusion of SVG-PDA found, transferrred to ICU; IABP dc'd on 11/5, and transferred to SDU; PT ordered 11/6  Clinical Impression   Pt admitted with above. Pt currently with functional limitations due to the deficits listed below (see PT Problem List).  Pt will benefit from skilled PT to increase their independence and safety with mobility to allow discharge to the venue listed below.       Follow Up Recommendations Home health PT;Supervision/Assistance - 24 hour    Equipment Recommendations  Rolling walker with 5" wheels;3in1 (PT)    Recommendations for Other Services OT consult     Precautions / Restrictions Precautions Precautions: Fall      Mobility  Bed Mobility                  Transfers Overall transfer level: Needs assistance Equipment used: Rolling walker (2 wheeled) Transfers: Sit to/from Stand Sit to Stand: Mod assist         General transfer comment: light mod assist to power-up  Ambulation/Gait Ambulation/Gait assistance: Min assist Ambulation Distance (Feet): 75 Feet Assistive device: Rolling walker (2 wheeled) Gait Pattern/deviations: Step-through pattern;Trunk flexed Gait velocity: slowed   General Gait Details: Cues for upright posture, RW proximity, and to self-monitor for activity tolerance  Stairs            Wheelchair Mobility    Modified Rankin (Stroke Patients Only)       Balance Overall balance assessment: Needs assistance         Standing balance support: Bilateral upper extremity supported Standing balance-Leahy Scale: Poor                               Pertinent Vitals/Pain Pain Assessment: No/denies pain     Home Living Family/patient expects to be discharged to:: Private residence Living Arrangements: Spouse/significant other Available Help at Discharge: Family;Available 24 hours/day Type of Home: House Home Access: Stairs to enter Entrance Stairs-Rails: Right Entrance Stairs-Number of Steps: 3 Home Layout: Two level;Able to live on main level with bedroom/bathroom Home Equipment: Dan Humphreys - 2 wheels;Cane - single point Additional Comments: Sleeps in recliner    Prior Function Level of Independence: Independent with assistive device(s)               Hand Dominance        Extremity/Trunk Assessment   Upper Extremity Assessment: Overall WFL for tasks assessed           Lower Extremity Assessment: Generalized weakness      Cervical / Trunk Assessment: Normal  Communication   Communication: No difficulties  Cognition Arousal/Alertness: Awake/alert Behavior During Therapy: WFL for tasks assessed/performed Overall Cognitive Status: Within Functional Limits for tasks assessed                      General Comments General comments (skin integrity, edema, etc.): See other PT note of this date for oxygen requirement during walk    Exercises        Assessment/Plan    PT Assessment Patient needs continued PT services  PT Diagnosis Difficulty walking;Generalized weakness   PT Problem List Decreased strength;Decreased activity tolerance;Decreased balance;Decreased mobility;Decreased knowledge of use of  DME;Cardiopulmonary status limiting activity  PT Treatment Interventions DME instruction;Gait training;Stair training;Functional mobility training;Therapeutic activities;Therapeutic exercise;Balance training;Patient/family education   PT Goals (Current goals can be found in the Care Plan section) Acute Rehab PT Goals Patient Stated Goal: would like to get home soon PT Goal Formulation: With patient Time For Goal Achievement: 11/04/14 Potential to Achieve  Goals: Good    Frequency Min 3X/week   Barriers to discharge        Co-evaluation               End of Session Equipment Utilized During Treatment: Oxygen Activity Tolerance: Patient tolerated treatment well Patient left: in chair;with call bell/phone within reach;with family/visitor present Nurse Communication: Mobility status         Time: 1455-1531 PT Time Calculation (min): 36 min   Charges:   PT Evaluation $Initial PT Evaluation Tier I: 1 Procedure PT Treatments $Gait Training: 23-37 mins   PT G CodesOlen Pel 10/21/2014, 6:21 PM  Van Clines, [place]  Acute Rehabilitation Services Pager [phone] Office [phone]

## 2014-10-21 NOTE — Progress Notes (Signed)
Patient ID: [name], male   DOB: 03-11-1941, 73 y.o.   MRN: [id number]     SUBJECTIVE:  IABP and swan removed on 10/18/2014. PICC placed. CO-OX  60%, CVP 10. Lasix drip stopped. Hydralazine/imdur added and spiro increased to 25 mg daily.   He complains of chest pain today, that happened after eating breakfast, relieved by NTG.   LHC/RHC (11/3): Patent LIMA-LAD and SVG-OM TO SVG-PDA (likely culprit) with TO PDA and 60% proximal RCA.  There were some left=>PDA collaterals  CVP 9 Co-ox 55%  Scheduled Meds: . antiseptic oral rinse  7 mL Mouth Rinse BID  . aspirin EC  81 mg Oral Daily  . fluticasone  2 spray Each Nare Daily  . hydrALAZINE  25 mg Oral 3 times per day  . insulin aspart  0-20 Units Subcutaneous TID WC  . insulin aspart  0-5 Units Subcutaneous QHS  . insulin aspart  3 Units Subcutaneous TID WC  . insulin glargine  37 Units Subcutaneous Daily  . isosorbide mononitrate  30 mg Oral Daily  . levothyroxine  175 mcg Oral Daily  . linagliptin  5 mg Oral Daily  . metoprolol succinate  25 mg Oral BID  . pantoprazole  40 mg Oral BID  . predniSONE  10 mg Oral Q breakfast  . ranitidine  150 mg Oral BID  . ranolazine  500 mg Oral BID  . rosuvastatin  20 mg Oral Daily  . sodium chloride  10-40 mL Intracatheter Q12H  . spironolactone  25 mg Oral Daily  . torsemide  40 mg Oral Daily  . Warfarin - Pharmacist Dosing Inpatient   Does not apply q1800   Continuous Infusions: . sodium chloride Stopped (10/18/14 1500)   PRN Meds:.acetaminophen **OR** acetaminophen, HYDROcodone-acetaminophen, nitroGLYCERIN, ondansetron **OR** ondansetron (ZOFRAN) IV, polyethylene glycol, sodium chloride   Filed Vitals:   10/21/14 0400 10/21/14 0453 10/21/14 0500 10/21/14 0535  BP: 114/80  118/70 119/74  Pulse: 89  94   Temp:      TempSrc:      Resp: 23  14   Height:      Weight:  216 lb 0.8 oz (98 kg)    SpO2: 96%  100%     Intake/Output Summary (Last 24 hours) at 10/21/14 0728 Last  data filed at 10/21/14 0400  Gross per 24 hour  Intake    460 ml  Output   2625 ml  Net  -2165 ml   LABS: Basic Metabolic Panel:  Recent Labs  95/62/13 0451 10/21/14 0430  NA 136* 138  K 4.2 4.2  CL 94* 97  CO2 31 31  GLUCOSE 123* 100*  BUN 30* 30*  CREATININE 1.49* 1.44*  CALCIUM 9.2 9.0   CBC:  Recent Labs  10/20/14 0451 10/21/14 0430  WBC 9.7 10.6*  HGB 13.7 13.6  HCT 40.8 41.9  MCV 95.3 96.1  PLT 177 199   RADIOLOGY: Dg Chest Port 1 View  10/18/2014   CLINICAL DATA:  PICC line placement.  EXAM: PORTABLE CHEST - 1 VIEW  COMPARISON:  Earlier today.  FINDINGS: Interval right PICC with its tip in the inferior aspect of the superior vena cava. Femoral vein catheter coiled in the right atrium and extending out the pulmonary outflow tract with its tip in the proximal right main pulmonary artery. Post CABG changes. The cardiac silhouette remains borderline enlarged. No significant change in patchy opacity at the left lung base. The intra-aortic balloon pump is unchanged.  IMPRESSION: 1. Right  PICC tip in the inferior aspect of the superior vena cava. If a position at the cavoatrial junction is desired, this could be advanced 1 cm. 2. Stable left basilar probable atelectasis. Pneumonia is less likely.   Electronically Signed   By: Gordan Payment M.D.   On: 10/18/2014 11:48   PHYSICAL EXAM General: NAD Neck: JVP 7-8 cm, no thyromegaly or thyroid nodule.  Lungs: Crackles at bases bilaterally. CV: Nondisplaced PMI.  Heart tachy, irregular S1/S2, no S3/S4, no murmur.  Trace ankle edema.    Abdomen: Soft, nontender, no hepatosplenomegaly, no distention.  Neurologic: Alert and oriented x 3.  Psych: Alert and orient x3  Extremities: No clubbing or cyanosis.   TELEMETRY: Reviewed telemetry pt in atrial fibrillation, rate controlled, HR in 90'    ASSESSMENT AND PLAN:  1. Atrial fibrillation: Chronic, likely permanent. Now rate controlled in 90'. Stopped milrinone and amiodarone  drip on Friday. Started digoxin.  Added Toprol XL 50 mg twice a day.  INR 1.74, Coumadin per pharmacy.   2. Acute on chronic systolic CHF with cardiogenic shock:  Echo on 11/2 showed that EF is about 25% with wall motion abnormalities. This is down from the past. IABP placed 11/3 with cardiogenic shock and milrinone begun with persistently low co-ox. CVP 9 today. He had very high filling pressures at RHC.  IABP and swan out 10/18/14.   - yesterday quite fluid overloaded with minimal diuresis, we increased torsemide to 40 mg PO BID with good diuresis -2.1 L in 24 hours, Crea is stable and slowly improving, he is still very fluid overloaded, we will continue the same dose of torsemide.  - Holding ACEI with soft BP and elevated creatinine (improving).  - Increased hydralazine to 25 mg tid and increase imdur to 30 mg daily.  - continue digoxin and increase Toprol XL 50 mg daily - Continue  spironolactone to 25 mg daily    3. nsVT - 3-7 beats - resolved after increasing Toprol XL to 50 mg po daily  4. ID: Possible PNA, fever earlier in stay. Mild WBC elevation. Procalcitonin is elevated.  I do not think this is the only process but may play a role. Influenza negative.  - Antibiotics per primary service (cefepime/vancomycin).   5. Elevated left hemidiaphragm: Chronic dyspnea. No history of smoking so doubt component of emphysema.   6. CAD: h/o CABG. NSTEMI this admission with TnI 4.9 peak. EF lower compared to 1/14, so concern for true ACS with plaque rupture rather than demand ischemia. Has chronic LBBB.No further chest pain.  Angiography as above showed that culprit was likely occlusion of SVG-PDA though this may have been subacute given some collaterals.  No good target for intervention. - Continue ASA, statin.  - continues to have chest pain - added Ranexa, PRN NTG as hypotensive  6. AKI: Creatinine down a little 1.7 > 1.5 > 1.4.  7. Immobility - Consult PT and cardiac rehab.   Keep  in CCU one more day, possibly to stepdown tomorrow     Lars Masson, MD 10/21/2014 7:28 AM

## 2014-10-22 ENCOUNTER — Inpatient Hospital Stay (HOSPITAL_COMMUNITY): Payer: Medicare Other

## 2014-10-22 LAB — BASIC METABOLIC PANEL
Anion gap: 12 (ref 5–15)
BUN: 30 mg/dL — ABNORMAL HIGH (ref 6–23)
CO2: 32 mEq/L (ref 19–32)
CREATININE: 1.56 mg/dL — AB (ref 0.50–1.35)
Calcium: 9.3 mg/dL (ref 8.4–10.5)
Chloride: 95 mEq/L — ABNORMAL LOW (ref 96–112)
GFR calc non Af Amer: 42 mL/min — ABNORMAL LOW (ref >90)
GFR, EST AFRICAN AMERICAN: 48 mL/min — AB (ref >90)
Glucose, Bld: 119 mg/dL — ABNORMAL HIGH (ref 70–99)
Potassium: 4.7 mEq/L (ref 3.7–5.3)
Sodium: 139 mEq/L (ref 137–147)

## 2014-10-22 LAB — CULTURE, BLOOD (ROUTINE X 2)
CULTURE: NO GROWTH
Culture: NO GROWTH

## 2014-10-22 LAB — PROTIME-INR
INR: 1.94 — ABNORMAL HIGH (ref 0.00–1.49)
Prothrombin Time: 22.3 seconds — ABNORMAL HIGH (ref 11.6–15.2)

## 2014-10-22 LAB — GLUCOSE, CAPILLARY
GLUCOSE-CAPILLARY: 105 mg/dL — AB (ref 70–99)
Glucose-Capillary: 258 mg/dL — ABNORMAL HIGH (ref 70–99)
Glucose-Capillary: 139 mg/dL — ABNORMAL HIGH (ref 70–99)

## 2014-10-22 LAB — CBC
HEMATOCRIT: 41.9 % (ref 39.0–52.0)
Hemoglobin: 14 g/dL (ref 13.0–17.0)
MCH: 32.1 pg (ref 26.0–34.0)
MCHC: 33.4 g/dL (ref 30.0–36.0)
MCV: 96.1 fL (ref 78.0–100.0)
Platelets: 224 10*3/uL (ref 150–400)
RBC: 4.36 MIL/uL (ref 4.22–5.81)
RDW: 15.1 % (ref 11.5–15.5)
WBC: 14.8 10*3/uL — ABNORMAL HIGH (ref 4.0–10.5)

## 2014-10-22 MED ORDER — METOPROLOL SUCCINATE ER 25 MG PO TB24
37.5000 mg | ORAL_TABLET | Freq: Two times a day (BID) | ORAL | Status: DC
  Administered 2014-10-22 – 2014-10-23 (×3): 37.5 mg via ORAL
  Filled 2014-10-22 (×4): qty 1

## 2014-10-22 MED ORDER — WARFARIN SODIUM 10 MG PO TABS
10.0000 mg | ORAL_TABLET | Freq: Once | ORAL | Status: AC
  Administered 2014-10-22: 10 mg via ORAL
  Filled 2014-10-22: qty 1

## 2014-10-22 MED ORDER — ISOSORBIDE MONONITRATE ER 60 MG PO TB24
60.0000 mg | ORAL_TABLET | Freq: Every day | ORAL | Status: DC
  Administered 2014-10-22 – 2014-10-23 (×2): 60 mg via ORAL
  Filled 2014-10-22 (×2): qty 1

## 2014-10-22 NOTE — Progress Notes (Signed)
Patient ID: [name], male   DOB: Apr 28, 1941, 73 y.o.   MRN: [id number]   SUBJECTIVE:  Patient feels good this morning.  Walked with PT yesterday.  No further chest pain.  He diuresed well yesterday on torsemide.  Last co-ox on 11/8 was 55%.  CVP 3.   LHC/RHC (11/3): Patent LIMA-LAD and SVG-OM TO SVG-PDA (likely culprit) with TO PDA and 60% proximal RCA.  There were some left=>PDA collaterals   CVP 3 Co-ox 55%  Scheduled Meds: . antiseptic oral rinse  7 mL Mouth Rinse BID  . aspirin EC  81 mg Oral Daily  . fluticasone  2 spray Each Nare Daily  . hydrALAZINE  25 mg Oral 3 times per day  . insulin aspart  0-20 Units Subcutaneous TID WC  . insulin aspart  0-5 Units Subcutaneous QHS  . insulin aspart  3 Units Subcutaneous TID WC  . insulin glargine  37 Units Subcutaneous Daily  . isosorbide mononitrate  60 mg Oral Daily  . levothyroxine  175 mcg Oral Daily  . linagliptin  5 mg Oral Daily  . metoprolol succinate  25 mg Oral BID  . pantoprazole  40 mg Oral BID  . predniSONE  10 mg Oral Q breakfast  . ranitidine  150 mg Oral BID  . rosuvastatin  20 mg Oral Daily  . sodium chloride  10-40 mL Intracatheter Q12H  . spironolactone  25 mg Oral Daily  . torsemide  40 mg Oral Daily  . Warfarin - Pharmacist Dosing Inpatient   Does not apply q1800   Continuous Infusions: . sodium chloride Stopped (10/18/14 1500)   PRN Meds:.acetaminophen **OR** acetaminophen, HYDROcodone-acetaminophen, nitroGLYCERIN, ondansetron **OR** ondansetron (ZOFRAN) IV, polyethylene glycol, sodium chloride    Filed Vitals:   10/22/14 0400 10/22/14 0500 10/22/14 0600 10/22/14 0700  BP: 115/99  92/67   Pulse: 61 85 64 81  Temp: 97.8 F (36.6 C)     TempSrc: Oral     Resp: 27 20 11 14   Height:      Weight:      SpO2: 91% 90% 94% 95%    Intake/Output Summary (Last 24 hours) at 10/22/14 0730 Last data filed at 10/22/14 0600  Gross per 24 hour  Intake   1255 ml  Output   3025 ml  Net  -1770 ml     LABS: Basic Metabolic Panel:  Recent Labs  13/09/15 0430 10/22/14 0240  NA 138 139  K 4.2 4.7  CL 97 95*  CO2 31 32  GLUCOSE 100* 119*  BUN 30* 30*  CREATININE 1.44* 1.56*  CALCIUM 9.0 9.3   Liver Function Tests: No results for input(s): AST, ALT, ALKPHOS, BILITOT, PROT, ALBUMIN in the last 72 hours. No results for input(s): LIPASE, AMYLASE in the last 72 hours. CBC:  Recent Labs  10/21/14 0430 10/22/14 0240  WBC 10.6* 14.8*  HGB 13.6 14.0  HCT 41.9 41.9  MCV 96.1 96.1  PLT 199 224   Cardiac Enzymes: No results for input(s): CKTOTAL, CKMB, CKMBINDEX, TROPONINI in the last 72 hours. BNP: Invalid input(s): POCBNP D-Dimer: No results for input(s): DDIMER in the last 72 hours. Hemoglobin A1C: No results for input(s): HGBA1C in the last 72 hours. Fasting Lipid Panel: No results for input(s): CHOL, HDL, LDLCALC, TRIG, CHOLHDL, LDLDIRECT in the last 72 hours. Thyroid Function Tests: No results for input(s): TSH, T4TOTAL, T3FREE, THYROIDAB in the last 72 hours.  Invalid input(s): FREET3 Anemia Panel: No results for input(s): VITAMINB12, FOLATE, FERRITIN, TIBC,  IRON, RETICCTPCT in the last 72 hours.  RADIOLOGY: Dg Chest Port 1 View  10/18/2014   CLINICAL DATA:  PICC line placement.  EXAM: PORTABLE CHEST - 1 VIEW  COMPARISON:  Earlier today.  FINDINGS: Interval right PICC with its tip in the inferior aspect of the superior vena cava. Femoral vein catheter coiled in the right atrium and extending out the pulmonary outflow tract with its tip in the proximal right main pulmonary artery. Post CABG changes. The cardiac silhouette remains borderline enlarged. No significant change in patchy opacity at the left lung base. The intra-aortic balloon pump is unchanged.  IMPRESSION: 1. Right PICC tip in the inferior aspect of the superior vena cava. If a position at the cavoatrial junction is desired, this could be advanced 1 cm. 2. Stable left basilar probable atelectasis.  Pneumonia is less likely.   Electronically Signed   By: Gordan Payment M.D.   On: 10/18/2014 11:48   Dg Chest Port 1 View  10/18/2014   CLINICAL DATA:  Shortness of breath, pneumonia  EXAM: PORTABLE CHEST - 1 VIEW  COMPARISON:  Portable chest x-ray of October 18, 2014  FINDINGS: The lungs are adequately inflated. There is subsegmental atelectasis at the left lung base. The pulmonary interstitial markings are less conspicuous today. There are post CABG changes. The cardiac silhouette is top-normal in size. The pulmonary vascularity is not engorged. The intra-aortic balloon pump radiodense tip lies in the proximal descending thoracic aorta. The Swan-Ganz catheter tip projects in the right main pulmonary artery and is stable. The observed bony thorax is unremarkable.  IMPRESSION: 1. There has been mild interval improvement in the appearance of the pulmonary interstitium consistent with improving CHF. There is persistent subsegmental atelectasis at the left lung base. 2. The intra-aortic balloon pump tip is in appropriate radiographic position.   Electronically Signed   By: David  [place]   On: 10/18/2014 07:30   Dg Chest Port 1 View  10/17/2014   CLINICAL DATA:  Placement of the intra-aortic balloon pump a cyst.  EXAM: PORTABLE CHEST - 1 VIEW  COMPARISON:  10/15/2014  FINDINGS: Postoperative changes in the mediastinum. Interval placement of central venous catheter via in superior approach with tip over the left pulmonary outflow tract. No pneumothorax. Cardiac enlargement with pulmonary vascular congestion and perihilar infiltrates suggesting edema. Small bilateral pleural effusions with basilar atelectasis. Radiopaque EIA BP tip localized over the upper descending thoracic aorta.  IMPRESSION: Appliances appear to be in satisfactory location. Cardiac enlargement with pulmonary vascular congestion, perihilar edema, and small bilateral pleural effusions.   Electronically Signed   By: Burman Nieves M.D.   On:  10/17/2014 00:09   Dg Chest Port 1 View  10/15/2014   CLINICAL DATA:  Hypoxia  EXAM: PORTABLE CHEST - 1 VIEW  COMPARISON:  Yesterday  FINDINGS: Vascular congestion and diffuse edema have developed. Upper normal heart size. No pneumothorax. Small pleural effusions are now present.  IMPRESSION: New edema and small pleural effusions.   Electronically Signed   By: Maryclare Bean M.D.   On: 10/15/2014 09:06   Dg Chest Port 1 View  10/14/2014   CLINICAL DATA:  Chest pain.  EXAM: PORTABLE CHEST - 1 VIEW  COMPARISON:  Chest x-ray 05/14/2013  FINDINGS: Prior CABG. Cardiomegaly. Pulmonary vascularity is normal. Mild infiltrate medial right lung base. No pleural effusion. Stable left pleural thickening noted consistent scarring. Degenerative changes thoracic spine both shoulders.  IMPRESSION: 1. Mild infiltrate medial right lung base. 2. Cardiomegaly.  Prior CABG.  No pulmonary venous congestion.   Electronically Signed   By: Maisie Fus  Register   On: 10/14/2014 10:10    PHYSICAL EXAM General: NAD Neck: JVP 8 cm, no thyromegaly or thyroid nodule.  Lungs: Crackles at bases bilaterally. CV: Nondisplaced PMI.  Heart irregular S1/S2, no S3/S4, no murmur.  No edema.    Abdomen: Soft, nontender, no hepatosplenomegaly, no distention.  Neurologic: Alert and oriented x 3.  Psych: Alert and orient x3  Extremities: No clubbing or cyanosis.   TELEMETRY: Reviewed telemetry pt in atrial fibrillation in 90s  ASSESSMENT AND PLAN: 1. Atrial fibrillation: Chronic, likely permanent. Now off amiodarone.   - Continue warfarin, INR rising. - Increase Toprol XL to 37.5 mg bid.  2. Acute on chronic systolic CHF with cardiogenic shock:  Echo on 11/2 showed that EF is about 25% with wall motion abnormalities. This is down from the past. IABP placed 11/3 with cardiogenic shock and milrinone begun with persistently low co-ox.  He had very high filling pressures at RHC.  IABP and swan out 10/18/14. PICC in place with CO-OX 55 on 11/8.   - He will stay off milrinone. Repeat co-ox in am.  - Volume status ok on torsemide, continue current dose.  -  Holding ACEI with soft BP and elevated creatinine.  - Continue current hydralazine/Imdur.  - Has not tolerated digoxin in the past and does not want to re-try. - Continue  spironolactone to 25 mg daily   3. ID: Possible PNA, fever earlier in stay. He has completed antibiotics course. Will get PA/lateral CXR to reassess lung fields.   4. Elevated left hemidiaphragm: Chronic dyspnea. No history of smoking so doubt component of emphysema.  5. CAD: h/o CABG. NSTEMI this admission with TnI 4.9 peak. EF lower compared to 1/14, so concern for true ACS with plaque rupture rather than demand ischemia. Has chronic LBBB.No further chest pain.  Angiography as above showed that culprit was likely occlusion of SVG-PDA though this may have been subacute given some collaterals.  No good target for intervention. - Continue ASA, statin.  - Have increased Imdur to 60 daily as he has been intolerant of ranolazine.  6. AKI: Creatinine stable. 7. Immobility - Consult PT and cardiac rehab.  8. Disposition: To telemetry, possibly home tomorrow.   Marca Ancona 10/22/2014 7:30 AM

## 2014-10-22 NOTE — Evaluation (Signed)
Occupational Therapy Evaluation Patient Details Name: [name] MRN: [id number] DOB: 1941-05-01 Today's Date: 10/22/2014    History of Present Illness Admitted with shortness of breath, CAP; developed afib with RVR; cardiac status became unstable went to cardiac Cath and IABP placed, occlusion of SVG-PDA found, transferrred to ICU; IABP dc'd on 11/5, and transferred to SDU;    Clinical Impression   Pt admitted with above. He demonstrates the below listed deficits and will benefit from continued OT to maximize safety and independence with BADLs.  Pt presents with generalized weakness.  Currently, he requires min guard assist - min A with BADLs, he and wife report he is close to his baseline and she feels comfortable assisting him at discharge.  He frequently drops sats to 86-87%, but then quickly recovers to 91-93%.  Difficult to determine accuracy of readings.       Follow Up Recommendations  No OT follow up;Supervision/Assistance - 24 hour    Equipment Recommendations  None recommended by OT    Recommendations for Other Services       Precautions / Restrictions Precautions Precautions: Fall      Mobility Bed Mobility Overal bed mobility: Needs Assistance Bed Mobility: Supine to Sit     Supine to sit: Supervision;HOB elevated Sit to supine: Modified independent (Device/Increase time)   General bed mobility comments: Pt instructed to log roll as it may decrease back pain   Transfers Overall transfer level: Needs assistance Equipment used: Rolling walker (2 wheeled) Transfers: Sit to/from [employer] Sit to Stand: Min guard Stand pivot transfers: Min guard       General transfer comment: To stand from bed.    Balance Overall balance assessment: Needs assistance Sitting-balance support: Feet supported Sitting balance-Leahy Scale: Good     Standing balance support: Single extremity supported Standing balance-Leahy Scale: Fair Standing  balance comment: support of walker                            ADL Overall ADL's : Needs assistance/impaired Eating/Feeding: Set up;Bed level;Sitting   Grooming: Wash/dry hands;Wash/dry face;Oral care;Brushing hair;Set up;Sitting   Upper Body Bathing: Minimal assitance;Sitting   Lower Body Bathing: Minimal assistance;Sit to/from stand   Upper Body Dressing : Set up;Sitting   Lower Body Dressing: Minimal assistance;Sit to/from stand   Toilet Transfer: Ambulation;Comfort height toilet;Min guard   Toileting- Clothing Manipulation and Hygiene: Minimal assistance;Sit to/from stand       Functional mobility during ADLs: Min guard;Rolling walker General ADL Comments: Pt fatigues quickly with activity.  02 sats drop periodically to 87%, but then rebound quickly back into the low 90s.  he does this frequently.  Difficult to determine accuracy of reading due to poor waveform.   Pt complains of LBP with LB ADLs - may benefit from AE.  Wife reports that pt is very close to his baseline level of functioning and she is available to assist him as needed      Vision                     Perception     Praxis      Pertinent Vitals/Pain Pain Assessment: 0-10 Pain Score: 4  Pain Location: back pain with activity  Pain Descriptors / Indicators: Aching Pain Intervention(s): Limited activity within patient's tolerance     Hand Dominance Right   Extremity/Trunk Assessment Upper Extremity Assessment Upper Extremity Assessment: Overall WFL for tasks assessed  Lower Extremity Assessment Lower Extremity Assessment: Overall WFL for tasks assessed   Cervical / Trunk Assessment Cervical / Trunk Assessment: Normal   Communication Communication Communication: No difficulties   Cognition Arousal/Alertness: Awake/alert Behavior During Therapy: WFL for tasks assessed/performed Overall Cognitive Status: Within Functional Limits for tasks assessed                      General Comments       Exercises       Shoulder Instructions      Home Living Family/patient expects to be discharged to:: Private residence Living Arrangements: Spouse/significant other Available Help at Discharge: Family;Available 24 hours/day Type of Home: House Home Access: Stairs to enter [employer] of Steps: 3 Entrance Stairs-Rails: Right Home Layout: Two level;Able to live on main level with bedroom/bathroom     Bathroom Shower/Tub: Tub/shower unit;Curtain Shower/tub characteristics: Engineer, building services: Standard     Home Equipment: Environmental consultant - 2 wheels;Cane - single point   Additional Comments: Sleeps in recliner      Prior Functioning/Environment Level of Independence: Independent with assistive device(s)             OT Diagnosis: Generalized weakness   OT Problem List: Decreased strength;Decreased activity tolerance;Impaired balance (sitting and/or standing)   OT Treatment/Interventions: Self-care/ADL training;DME and/or AE instruction;Therapeutic activities;Patient/family education;Balance training    OT Goals(Current goals can be found in the care plan section) Acute Rehab OT Goals Patient Stated Goal: to go home ASAP OT Goal Formulation: With patient/family Time For Goal Achievement: 11/05/14 Potential to Achieve Goals: Good ADL Goals Pt Will Perform Lower Body Bathing: with supervision;with adaptive equipment;sit to/from stand Pt Will Perform Lower Body Dressing: with supervision;sit to/from stand;with adaptive equipment Pt Will Transfer to Toilet: with supervision;ambulating;regular height toilet;grab bars  OT Frequency: Min 2X/week   Barriers to D/C:            Co-evaluation              End of Session Equipment Utilized During Treatment: Engineer, water Communication: Mobility status  Activity Tolerance: Patient limited by fatigue Patient left: Other (comment) (with PT)   Time: 8850-2774 OT Time Calculation  (min): 30 min Charges:  OT General Charges $OT Visit: 1 Procedure OT Evaluation $Initial OT Evaluation Tier I: 1 Procedure OT Treatments $Self Care/Home Management : 8-22 mins $Therapeutic Activity: 8-22 mins G-Codes:    [name] M Nov 18, 2014, 4:31 PM

## 2014-10-22 NOTE — Progress Notes (Signed)
ANTICOAGULATION CONSULT NOTE - Follow Up Consult  Pharmacy Consult for Coumadin Indication: atrial fibrillation   Allergies  Allergen Reactions  . Butrans [Buprenorphine] Shortness Of Breath  . Clarithromycin Other (See Comments)    Unknown, thinks "blisters in mouth"  . Influenza Vaccines Other (See Comments)    Severe coughing for weeks after vaccine; also had flu like symptoms for about 7 to 8 weeks.   . Lanoxin [Digoxin] Other (See Comments)    Nausea, anorexia  . Lisinopril Other (See Comments)    cough  . Ranexa [Ranolazine Er]   . Statins     Patient Measurements: Height: 6' (182.9 cm) Weight: 215 lb 13.3 oz (97.9 kg) IBW/kg (Calculated) : 77.6  Vital Signs: Temp: 97.8 F (36.6 C) (11/09 0400) Temp Source: Oral (11/09 0400) BP: 92/67 mmHg (11/09 0600) Pulse Rate: 81 (11/09 0700)  Labs:  Recent Labs  10/20/14 0451 10/21/14 0430 10/22/14 0240  HGB 13.7 13.6 14.0  HCT 40.8 41.9 41.9  PLT 177 199 224  LABPROT 18.3* 20.5* 22.3*  INR 1.50* 1.74* 1.94*  CREATININE 1.49* 1.44* 1.56*    Estimated Creatinine Clearance: 49.6 mL/min (by C-G formula based on Cr of 1.56).  Assessment: 73 yo male on coumadin pta for afib. Coumadin held on admission for cath and heparin bridge started. Heparin was resumed post-cath as patient needed an IABP. IABP was pulled 11/6, heparin discontinued, and coumadin resumed. INR is slighty below goal but increasing appropriately with higher 12.5mg  doses. Will try to resume home regimen today. Amiodarone drip was started on admission but discontinued 11/6.  Home regimen: 7.5mg  daily except 10mg  Mon/Fri  Goal of Therapy:  INR 2-3 Monitor platelets by anticoagulation protocol: Yes    Plan:  1) Coumadin 10 mg x 1 2) Daily INR  , PharmD, BCPS 10/22/2014 10:57 AM

## 2014-10-22 NOTE — Progress Notes (Signed)
Physical Therapy Treatment Patient Details Name: [name] MRN: [id number] DOB: [birth year] Today's Date: 03-Nov-2014    History of Present Illness Admitted with shortness of breath, CAP; developed afib with RVR; cardiac status became unstable went to cardiac Cath and IABP placed, occlusion of SVG-PDA found, transferrred to ICU; IABP dc'd on 11/5, and transferred to SDU;     PT Comments    Pt making steady progress.  Follow Up Recommendations  Home health PT;Supervision/Assistance - 24 hour     Equipment Recommendations  None recommended by PT    Recommendations for Other Services       Precautions / Restrictions Precautions Precautions: Fall    Mobility  Bed Mobility Overal bed mobility: Modified Independent Bed Mobility: Sit to Supine       Sit to supine: Modified independent (Device/Increase time)      Transfers Overall transfer level: Needs assistance Equipment used: Rolling walker (2 wheeled) Transfers: Sit to/from Stand Sit to Stand: Min guard         General transfer comment: To stand from bed.  Ambulation/Gait Ambulation/Gait assistance: Min guard Ambulation Distance (Feet): 50 Feet (x 2) Assistive device: Rolling walker (2 wheeled) Gait Pattern/deviations: Step-through pattern;Decreased step length - right;Decreased step length - left;Trunk flexed Gait velocity: slowed Gait velocity interpretation: Below normal speed for age/gender General Gait Details: Verbal cues to stand more erect and stay closer to walker. Pt required 1 sitting rest break. SaO2 reading 80% on RA but difficult to tell if this accurate. Replaced O2.   Stairs            Wheelchair Mobility    Modified Rankin (Stroke Patients Only)       Balance Overall balance assessment: Needs assistance         Standing balance support: Bilateral upper extremity supported Standing balance-Leahy Scale: Poor Standing balance comment: support of walker                     Cognition Arousal/Alertness: Awake/alert Behavior During Therapy: WFL for tasks assessed/performed Overall Cognitive Status: Within Functional Limits for tasks assessed                      Exercises      General Comments        Pertinent Vitals/Pain Pain Assessment: No/denies pain    Home Living                      Prior Function            PT Goals (current goals can now be found in the care plan section) Progress towards PT goals: Progressing toward goals    Frequency  Min 3X/week    PT Plan Current plan remains appropriate    Co-evaluation             End of Session Equipment Utilized During Treatment: Oxygen Activity Tolerance: Patient limited by fatigue Patient left: in bed;with call bell/phone within reach;with family/visitor present     Time: 3382-5053 PT Time Calculation (min): 8 min  Charges:  $Gait Training: 8-22 mins                    G Codes:      [facility] 11-03-14, 2:59 PM  [employer] PT [phone]

## 2014-10-22 NOTE — Progress Notes (Signed)
[facility] TEAM 1 - Stepdown/ICU TEAM Progress Note  [name] GEX:[id number] DOB: October 24, 1941 DOA: 10/14/2014 PCP: Thayer Headings, MD  Admit HPI / Brief Narrative: 73 year old M w/ Hx ischemic cardiomyopathy, known CAD involving bypass graft on medical therapy, mild systolic dysfunction with an EF of 40-45%, and diabetes who presented to the emergency department c/o shortness of breath ongoing for 4 days. He reported coughing without fever or other constitutional signs. This shortness of breath was different from his baseline which is caused by an issue with a paralyzed left hemidiaphragm; he is followed by Dr. Shelle Iron as an outpatient for this.   Evaluation in the ER revealed mild leukocytosis with a white count of 12,000, chest x-ray demonstrated mild interstitial infiltrates in the right base. It was felt his presenting symptoms were likely related to community-acquired pneumonia and he was started on empiric antibiotic therapy. There was some concern he may have mild heart failure exacerbation related to the acute illness noting his proBNP was 2372 but this was not higher than his apparent baseline which is between 2300 and 2400.  After admission patient developed atrial fibrillation with rapid ventricular response and was subsequently started on IV Cardizem drip. He later developed a rectal temp of 102.5 rectally. Subsequently his antibiotics were broadened.  By 11/2 patient continued to require high levels of oxygen at 6 L/m. His ventricular rate was better controlled in the 70s and the Cardizem was weaned and discontinued. Blood pressures remained soft in the 90 systolic range. His creatinine had increased. He was noted to have focal lower extremity edema around 2+ on the left lower extremity and 1+ on the right lower extremity. He had developed bilateral crackles on exam. Follow-up chest x-ray was consistent with pulmonary edema so Lasix was started.  On 11/3 patient became unstable  from a cardiac standpoint as evidenced by persistent A. Fib with RVR and chest pain. He was taken to the Cath Lab where an IABP was placed; Swan-Ganz catheter was also placed and the patient was subsequently sent to the ICU. He was started on IV amiodarone for rate control and a milrinone infusion to help with his cardiogenic shock. He was also initiated on a Lasix infusion. Catheterization did reveal a likely culprit lesion being occlusion of the SVG-PDA which cardiology felt may have been subacute given the fact he has collaterals. Again no good target lesion was identified for intervention.  The IABP was dc'd 11/5 and Milrinone weaning initiated. By 11/6 Milrinone was dc'd and IV Amiodarone dc'd in favor of metoprolol and digoxin. His Lasix drip had previously been transitioned to PO Demadex 11/5. Cardiology transferred him to SDU. PT recommended HH PT. He has had O2 desats with ambulation so will likely need O2 at discharge.  HPI/Subjective: Patient alert and denies CP or SOB at rest  Assessment/Plan:  Cardiomyopathy, ischemic,EF 40-45% 01/2013 per cath / Pulmonary edema -S/P right and left heart cardiac catheterization - req. IABP for cardiogenic shock with heart failure; (see below) - managed per cardiology: Lasix drip was dc'd 11/5 in favor of Demadex, milrinone dc'd 11/6, IABP discontinued 11/5 - cont Spironoloactone; Imdur and hydralazine  CAD of artery bypass graft/ NSTEMI Cont ASA, nitrates and statin--no target lesion for intervention identified but it is suspected that the SVG/PDA was culprit lesion--has multiple collaterals-TNI peak 4.9-concern was for true ACS with plaque rupture rather than demand ischemia - has chronic LBBB   Exertional hypoxia Qualifies for HH oxygen- orders placed and face to face completed  Atrial fibrillation with RVR -Discontinued IV amiodarone (per cardiology) in favor of Metoprolol -rates better controlled- no digoxin since intolerant in past   Essential  hypertension -Blood pressure stable  Chronic anticoagulation -Has been transitioned back to warfarin now that balloon pump out  Fever/CAP  -an empiric 5 day tx course was completed - influenza PCR negative  Acute respiratory failure with hypoxia:  -multifactorial due to decompensated ischemic CHF and suspected CAP  Diabetes mellitus -Cont Lantus, Tradjenta and resistant SSI - hemoglobin A1c 8.6   Physical Deconditioning PT/OT eval - hypoxia as above - cont HH PT  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 Shelle Iron with pulmonology as an outpatient   Code Status: FULL Family Communication: No family at bedside Disposition Plan: Transfer to Telemetry - possible dc 11/10  Consultants: Dr. Bryan Lemma (cardiology)  Procedure/Significant Events: 11/3 Right & Left Heart Catheterization with Native Coronary and Graft Angiography via RIGHT COMMON FEMORA Artery & Vein Access.INTRA-AORTIC BALLOON PUMP PLACEMENT - for decompensated Combined CHF  11/2 2-D echocardiogram:- Left ventricle: There was moderate concentric hypertrophy. Systolic function was mildly reduced. The estimated ejection fraction was in the range of 45% to 50%. Hypokinesis of the lateral and inferolateral myocardium. Atrial arrhythmia prevents adequate evaluation of LV diastolic function. - Ventricular septum: Septal motion showed paradox. - Aortic valve: Mild regurgitation. - Left atrium: The atrium was mildly dilated. - Right atrium: The atrium was mildly dilated.   Antibiotics: Cefepime 11/1>stopped 11/5 Vancomycin 11/1>11/4  DVT prophylaxis: warfarin  Objective: Blood pressure 92/67, pulse 116, temperature 97.8 F (36.6 C), temperature source Oral, resp. rate 12, height 6' (1.829 m), weight 215 lb 13.3 oz (97.9 kg), SpO2 94 %.  Intake/Output Summary (Last 24 hours) at 10/22/14 1346 Last data filed at 10/22/14 1200  Gross per  24 hour  Intake    535 ml  Output   2225 ml  Net  -1690 ml   Exam: General: no respiratory distress, alert and talkative Lungs: Clear to auscultation bilaterally without wheezes or crackles, RA at rest Cardiovascular: Irregular rate and rhythm ,no rubs murmurs thrills or gallops, tr. peripheral edema left lower extremity Abdomen: Nontender, nondistended, soft, bowel sounds positive, no rebound, no ascites, no appreciable mass Extremities: No significant cyanosis, clubbing of bilateral lower extremities  Data Reviewed: Basic Metabolic Panel:  Recent Labs Lab 10/17/14 0345  10/18/14 0430 10/19/14 0320 10/20/14 0451 10/21/14 0430 10/22/14 0240  NA 136*  < > 138 135* 136* 138 139  K 3.6*  < > 3.7 4.0 4.2 4.2 4.7  CL 95*  < > 95* 93* 94* 97 95*  CO2 29  < > 30 29 31 31  32  GLUCOSE 219*  < > 168* 201* 123* 100* 119*  BUN 30*  < > 32* 31* 30* 30* 30*  CREATININE 1.51*  < > 1.71* 1.57* 1.49* 1.44* 1.56*  CALCIUM 8.5  < > 8.7 8.6 9.2 9.0 9.3  MG 1.9  --   --   --   --   --   --   < > = values in this interval not displayed.   Liver Function Tests: No results for input(s): AST, ALT, ALKPHOS, BILITOT, PROT, ALBUMIN in the last 168 hours.   CBC:  Recent Labs Lab 10/18/14 0800 10/19/14 0320 10/20/14 0451 10/21/14 0430 10/22/14 0240  WBC 9.7 9.7 9.7 10.6* 14.8*  HGB 13.5 13.0 13.7 13.6 14.0  HCT 40.3 39.3 40.8 41.9 41.9  MCV  94.4 94.9 95.3 96.1 96.1  PLT 147* 156 177 199 224   Cardiac Enzymes:  Recent Labs Lab 10/15/14 1947 10/16/14 0053  TROPONINI 4.44* 2.95*    CBG:  Recent Labs Lab 10/21/14 1233 10/21/14 1738 10/21/14 2135 10/22/14 0734 10/22/14 1147  GLUCAP 201* 203* 158* 105* 258*    Recent Results (from the past 240 hour(s))  Culture, blood (routine x 2) Call MD if unable to obtain prior to antibiotics being given     Status: None   Collection Time: 10/14/14  1:25 PM  Result Value Ref Range Status   Specimen Description BLOOD RIGHT HAND  Final    Special Requests BOTTLES DRAWN AEROBIC AND ANAEROBIC 10CC  Final   Culture  Setup Time   Final    10/15/2014 02:25 Performed at [facility]    Culture   Final    NO GROWTH 5 DAYS Performed at [facility]    Report Status 10/22/2014 FINAL  Final  Culture, blood (routine x 2) Call MD if unable to obtain prior to antibiotics being given     Status: None   Collection Time: 10/14/14  1:29 PM  Result Value Ref Range Status   Specimen Description BLOOD LEFT HAND  Final   Special Requests BOTTLES DRAWN AEROBIC AND ANAEROBIC 10CC  Final   Culture  Setup Time   Final    10/15/2014 02:25 Performed at [facility]    Culture   Final    NO GROWTH 5 DAYS Performed at [facility]    Report Status 10/22/2014 FINAL  Final  MRSA PCR Screening     Status: None   Collection Time: 10/14/14  6:37 PM  Result Value Ref Range Status   MRSA by PCR NEGATIVE NEGATIVE Final    Comment:        The GeneXpert MRSA Assay (FDA approved for NASAL specimens only), is one component of a comprehensive MRSA colonization surveillance program. It is not intended to diagnose MRSA infection nor to guide or monitor treatment for MRSA infections.      Studies:  Recent x-ray studies have been reviewed in detail by the Attending Physician  Scheduled Meds:  Scheduled Meds: . antiseptic oral rinse  7 mL Mouth Rinse BID  . aspirin EC  81 mg Oral Daily  . fluticasone  2 spray Each Nare Daily  . hydrALAZINE  25 mg Oral 3 times per day  . insulin aspart  0-20 Units Subcutaneous TID WC  . insulin aspart  0-5 Units Subcutaneous QHS  . insulin aspart  3 Units Subcutaneous TID WC  . insulin glargine  37 Units Subcutaneous Daily  . isosorbide mononitrate  60 mg Oral Daily  . levothyroxine  175 mcg Oral Daily  . linagliptin  5 mg Oral Daily  . metoprolol succinate  37.5 mg Oral BID  . pantoprazole  40 mg Oral BID  . predniSONE  10 mg Oral Q breakfast  . ranitidine  150 mg Oral  BID  . rosuvastatin  20 mg Oral Daily  . sodium chloride  10-40 mL Intracatheter Q12H  . spironolactone  25 mg Oral Daily  . torsemide  40 mg Oral Daily  . warfarin  10 mg Oral ONCE-1800  . Warfarin - Pharmacist Dosing Inpatient   Does not apply q1800    Time spent on care of this patient: 35 mins   ELLIS,ALLISON L. , ANP  [facility] Office  [phone] Pager - [phone]  On-Call/Text Page:  [email or web address]      password [email or web address]  If 7PM-7AM, please contact night-coverage [email or web address] Password [email or web address] 10/22/2014, 1:46 PM   LOS: 8 days   I have personally examined this patient and reviewed the entire database. I have reviewed the above note, made any necessary editorial changes, and agree with its content.  Lonia Blood, MD [facility]

## 2014-10-22 NOTE — Progress Notes (Signed)
CARDIAC REHAB PHASE I   PRE:  Rate/Rhythm: 97 afib    BP: sitting 136/69    SaO2: 94 RA  MODE:  Ambulation: 72 ft   POST:  Rate/Rhythm: 144 afib    BP: sitting 149/58     SaO2: 97 2L  Pt able to stand almost independently. Used rollator, gait belt, assist x2. Began on RA however SaO2 seemingly down to 82 RA at door. Difficult to determine if accurate, not good reading due to afib. Pt denied SOB. Pt ambulated 36 ft into hall and sat to rest (this is his baseline due to spinal stenosis). HR elevated to 140s with ambulation. SaO2 continued to be low in 80s therefore applied 2L O2 for walk back to room. To recliner. When asked pt reported slight chest tightness at end of walk, maybe 2/10. With rest chest tightness down to 1/10. Left pt on RA as his SaO2 reads 98 RA with good pulse pattern. Will f/u am. [phone]  Elissa Lovett Engelhard CES, ACSM 10/22/2014 9:15 AM

## 2014-10-23 DIAGNOSIS — E08329 Diabetes mellitus due to underlying condition with mild nonproliferative diabetic retinopathy without macular edema: Secondary | ICD-10-CM

## 2014-10-23 DIAGNOSIS — R531 Weakness: Secondary | ICD-10-CM

## 2014-10-23 DIAGNOSIS — J441 Chronic obstructive pulmonary disease with (acute) exacerbation: Secondary | ICD-10-CM

## 2014-10-23 DIAGNOSIS — J810 Acute pulmonary edema: Secondary | ICD-10-CM | POA: Insufficient documentation

## 2014-10-23 DIAGNOSIS — R0602 Shortness of breath: Secondary | ICD-10-CM

## 2014-10-23 DIAGNOSIS — E1139 Type 2 diabetes mellitus with other diabetic ophthalmic complication: Secondary | ICD-10-CM | POA: Insufficient documentation

## 2014-10-23 LAB — CARBOXYHEMOGLOBIN
Carboxyhemoglobin: 0.8 % (ref 0.5–1.5)
Methemoglobin: 1.1 % (ref 0.0–1.5)
O2 Saturation: 58.7 %
TOTAL HEMOGLOBIN: 14.1 g/dL (ref 13.5–18.0)

## 2014-10-23 LAB — BASIC METABOLIC PANEL
ANION GAP: 11 (ref 5–15)
BUN: 26 mg/dL — AB (ref 6–23)
CO2: 31 mEq/L (ref 19–32)
Calcium: 9.1 mg/dL (ref 8.4–10.5)
Chloride: 98 mEq/L (ref 96–112)
Creatinine, Ser: 1.41 mg/dL — ABNORMAL HIGH (ref 0.50–1.35)
GFR, EST AFRICAN AMERICAN: 55 mL/min — AB (ref >90)
GFR, EST NON AFRICAN AMERICAN: 47 mL/min — AB (ref >90)
Glucose, Bld: 96 mg/dL (ref 70–99)
POTASSIUM: 4.8 meq/L (ref 3.7–5.3)
SODIUM: 140 meq/L (ref 137–147)

## 2014-10-23 LAB — CBC
HCT: 42.7 % (ref 39.0–52.0)
Hemoglobin: 13.6 g/dL (ref 13.0–17.0)
MCH: 31.6 pg (ref 26.0–34.0)
MCHC: 31.9 g/dL (ref 30.0–36.0)
MCV: 99.3 fL (ref 78.0–100.0)
Platelets: 239 10*3/uL (ref 150–400)
RBC: 4.3 MIL/uL (ref 4.22–5.81)
RDW: 15.6 % — AB (ref 11.5–15.5)
WBC: 13.4 10*3/uL — ABNORMAL HIGH (ref 4.0–10.5)

## 2014-10-23 LAB — PROTIME-INR
INR: 2.47 — AB (ref 0.00–1.49)
PROTHROMBIN TIME: 27 s — AB (ref 11.6–15.2)

## 2014-10-23 LAB — GLUCOSE, CAPILLARY
GLUCOSE-CAPILLARY: 179 mg/dL — AB (ref 70–99)
Glucose-Capillary: 158 mg/dL — ABNORMAL HIGH (ref 70–99)
Glucose-Capillary: 109 mg/dL — ABNORMAL HIGH (ref 70–99)

## 2014-10-23 MED ORDER — ISOSORBIDE MONONITRATE ER 60 MG PO TB24: 60.0000 mg | ORAL_TABLET | Freq: Every day | ORAL | Status: DC

## 2014-10-23 MED ORDER — INSULIN GLARGINE 100 UNIT/ML SOLOSTAR PEN: 37.0000 [IU] | PEN_INJECTOR | Freq: Every day | SUBCUTANEOUS | Status: DC

## 2014-10-23 MED ORDER — METOPROLOL SUCCINATE 12.5 MG HALF TABLET
37.5000 mg | ORAL_TABLET | Freq: Two times a day (BID) | ORAL | Status: DC
Start: 2014-10-23 — End: 2014-11-27

## 2014-10-23 MED ORDER — HYDROCODONE-ACETAMINOPHEN 5-325 MG PO TABS: 1.0000 | ORAL_TABLET | ORAL | Status: DC | PRN

## 2014-10-23 MED ORDER — TORSEMIDE 20 MG PO TABS: 40.0000 mg | ORAL_TABLET | Freq: Every day | ORAL | Status: DC

## 2014-10-23 MED ORDER — ROSUVASTATIN CALCIUM 20 MG PO TABS: 20.0000 mg | ORAL_TABLET | Freq: Every day | ORAL | Status: DC

## 2014-10-23 MED ORDER — SPIRONOLACTONE 25 MG PO TABS
25.0000 mg | ORAL_TABLET | Freq: Every day | ORAL | Status: DC
Start: 2014-10-23 — End: 2014-11-13

## 2014-10-23 MED ORDER — HYDRALAZINE HCL 25 MG PO TABS: 25.0000 mg | ORAL_TABLET | Freq: Three times a day (TID) | ORAL | Status: DC

## 2014-10-23 NOTE — Progress Notes (Signed)
CARDIAC REHAB PHASE I   PRE:  Rate/Rhythm: 96 afib  BP:  Supine:   Sitting: 107/63  Standing:    SaO2: 95%RA  MODE:  Ambulation: 72 ft   POST:  Rate/Rhythm: 162 max afib, 96 with rest  BP:  Supine:   Sitting: 138/85  Standing:    SaO2: 98% Hall, 96% room [phone] Pt did well on RA. Walked 72 ft on RA with rollator, gait belt use and asst x2 with fairly steady gait. Sat at 36 ft to rest and checked sats at 98%. Pt c/o shoulder pain which he stated is not unusual for him. To recliner after walk. Heart rated briefly to 162 and then to 140s with activity and to 96 with rest. Pt would like a rollator for home use. Case manager aware. Pt's wife has CHF booklet and stated they weigh every day and do whatever the MD tells them to. When asked if pt had chest discomfort during walk, he stated 0.5 on scale of 1 to 10. Family in room.   Luetta Nutting, RN BSN  10/23/2014 9:27 AM

## 2014-10-23 NOTE — Progress Notes (Signed)
SATURATION QUALIFICATIONS: (This note is used to comply with regulatory documentation for home oxygen)  Patient Saturations on Room Air at Rest = 82%  Patient Saturations on Room Air while Ambulating = %  Patient Saturations on 2 Liters of oxygen while Ambulating =98 %  Please briefly explain why patient needs home oxygen:

## 2014-10-23 NOTE — Progress Notes (Signed)
PICC line removed,pressure dressing applied,no bleeding noted at site,patient tolerated procedure well. Instructed patient to remain supine for 30 min. afterr PICC removal to avoid complications. Instructed patient that pressure dressing to remain in place for 24 hrs. Eliot Ford RN [facility].

## 2014-10-23 NOTE — Progress Notes (Signed)
Discharge instructions given to patient and family at bedside. Verbalized understanding. asissted to car via wheelchair with no distress noted.

## 2014-10-23 NOTE — Discharge Instructions (Signed)
Torsemide tablets °What is this medicine? °TORSEMIDE (TORE se mide) is a diuretic. It helps you make more urine and to lose salt and excess water from your body. This medicine is used to treat high blood pressure, and edema or swelling from heart, kidney, or liver disease. °This medicine may be used for other purposes; ask your health care provider or pharmacist if you have questions. °COMMON BRAND NAME(S): Demadex °What should I tell my health care provider before I take this medicine? °They need to know if you have any of these conditions: °-abnormal blood electrolytes °-diabetes °-gout °-heart disease °-kidney disease °-liver disease °-small amounts of urine, or difficulty passing urine °-an unusual or allergic reaction to torsemide, sulfa drugs, other medicines, foods, dyes, or preservatives °-pregnant or trying to get pregnant °-breast-feeding °How should I use this medicine? °Take this medicine by mouth with a glass of water. Follow the directions on the prescription label. You may take this medicine with or without food. If it upsets your stomach, take it with food or milk. Do not take your medicine more often than directed. Remember that you will need to pass more urine after taking this medicine. Do not take your medicine at a time of day that will cause you problems. Do not take at bedtime. °Talk to your pediatrician regarding the use of this medicine in children. Special care may be needed. °Overdosage: If you think you have taken too much of this medicine contact a poison control center or emergency room at once. °NOTE: This medicine is only for you. Do not share this medicine with others. °What if I miss a dose? °If you miss a dose, take it as soon as you can. If it is almost time for your next dose, take only that dose. Do not take double or extra doses. °What may interact with this medicine? °-alcohol °-certain antibiotics given by injection °certain heart medicines like  digoxin °-diuretics °-lithium °-medicines for diabetes °-medicines for blood pressure °-medicines for cholesterol like cholestyramine °-medicines that relax muscles for surgery °-NSAIDs, medicines for pain and inflammation, like ibuprofen or naproxen °-OTC supplements like ginseng and ephedra °-probenecid °-steroid medicines like prednisone or cortisone °This list may not describe all possible interactions. Give your health care provider a list of all the medicines, herbs, non-prescription drugs, or dietary supplements you use. Also tell them if you smoke, drink alcohol, or use illegal drugs. Some items may interact with your medicine. °What should I watch for while using this medicine? °Visit your doctor or health care professional for regular checks on your progress. Check your blood pressure regularly. Ask your doctor or health care professional what your blood pressure should be, and when you should contact him or her. If you are a diabetic, check your blood sugar as directed. °You may need to be on a special diet while taking this medicine. Check with your doctor. Also, ask how many glasses of fluid you need to drink a day. You must not get dehydrated. °You may get drowsy or dizzy. Do not drive, use machinery, or do anything that needs mental alertness until you know how this drug affects you. Do not stand or sit up quickly, especially if you are an older patient. This reduces the risk of dizzy or fainting spells. Alcohol can make you more drowsy and dizzy. Avoid alcoholic drinks. °What side effects may I notice from receiving this medicine? °Side effects that you should report to your doctor or health care professional as soon as possible: °-  allergic reactions such as skin rash or itching, hives, swelling of the lips, mouth, tongue or throat -blood in urine or stool -dry mouth -hearing loss or ringing in the ears -irregular heartbeat -muscle pain, weakness or cramps -pain or difficulty passing  urine -unusually weak or tired -vomiting or diarrhea Side effects that usually do not require medical attention (report to your doctor or health care professional if they continue or are bothersome): -dizzy or lightheaded -headache -increased thirst -passing large amounts of urine -sexual difficulties -stomach pain, upset or nausea This list may not describe all possible side effects. Call your doctor for medical advice about side effects. You may report side effects to FDA at 1-800-FDA-1088. Where should I keep my medicine? Keep out of the reach of children. Store at room temperature between 15 and 30 degrees C (59 and 86 degrees F). Throw away any unused medicine after the expiration date. NOTE: This sheet is a summary. It may not cover all possible information. If you have questions about this medicine, talk to your doctor, pharmacist, or health care provider.  2015, [employer]. (2008-08-16 11:35:45) Heart Failure Heart failure is a condition in which the heart has trouble pumping blood. This means your heart does not pump blood efficiently for your body to work well. In some cases of heart failure, fluid may back up into your lungs or you may have swelling (edema) in your lower legs. Heart failure is usually a long-term (chronic) condition. It is important for you to take good care of yourself and follow your health care provider's treatment plan. CAUSES  Some health conditions can cause heart failure. Those health conditions include:  High blood pressure (hypertension). Hypertension causes the heart muscle to work harder than normal. When pressure in the blood vessels is high, the heart needs to pump (contract) with more force in order to circulate blood throughout the body. High blood pressure eventually causes the heart to become stiff and weak.  Coronary artery disease (CAD). CAD is the buildup of cholesterol and fat (plaque) in the arteries of the heart. The blockage in  the arteries deprives the heart muscle of oxygen and blood. This can cause chest pain and may lead to a heart attack. High blood pressure can also contribute to CAD.  Heart attack (myocardial infarction). A heart attack occurs when one or more arteries in the heart become blocked. The loss of oxygen damages the muscle tissue of the heart. When this happens, part of the heart muscle dies. The injured tissue does not contract as well and weakens the heart's ability to pump blood.  Abnormal heart valves. When the heart valves do not open and close properly, it can cause heart failure. This makes the heart muscle pump harder to keep the blood flowing.  Heart muscle disease (cardiomyopathy or myocarditis). Heart muscle disease is damage to the heart muscle from a variety of causes. These can include drug or alcohol abuse, infections, or unknown reasons. These can increase the risk of heart failure.  Lung disease. Lung disease makes the heart work harder because the lungs do not work properly. This can cause a strain on the heart, leading it to fail.  Diabetes. Diabetes increases the risk of heart failure. High blood sugar contributes to high fat (lipid) levels in the blood. Diabetes can also cause slow damage to tiny blood vessels that carry important nutrients to the heart muscle. When the heart does not get enough oxygen and food, it can cause the heart to  become weak and stiff. This leads to a heart that does not contract efficiently.  Other conditions can contribute to heart failure. These include abnormal heart rhythms, thyroid problems, and low blood counts (anemia). Certain unhealthy behaviors can increase the risk of heart failure, including:  Being overweight.  Smoking or chewing tobacco.  Eating foods high in fat and cholesterol.  Abusing illicit drugs or alcohol.  Lacking physical activity. SYMPTOMS  Heart failure symptoms may vary and can be hard to detect. Symptoms may  include:  Shortness of breath with activity, such as climbing stairs.  Persistent cough.  Swelling of the feet, ankles, legs, or abdomen.  Unexplained weight gain.  Difficulty breathing when lying flat (orthopnea).  Waking from sleep because of the need to sit up and get more air.  Rapid heartbeat.  Fatigue and loss of energy.  Feeling light-headed, dizzy, or close to fainting.  Loss of appetite.  Nausea.  Increased urination during the night (nocturia). DIAGNOSIS  A diagnosis of heart failure is based on your history, symptoms, physical examination, and diagnostic tests. Diagnostic tests for heart failure may include:  Echocardiography.  Electrocardiography.  Chest X-ray.  Blood tests.  Exercise stress test.  Cardiac angiography.  Radionuclide scans. TREATMENT  Treatment is aimed at managing the symptoms of heart failure. Medicines, behavioral changes, or surgical intervention may be necessary to treat heart failure.  Medicines to help treat heart failure may include:  Angiotensin-converting enzyme (ACE) inhibitors. This type of medicine blocks the effects of a blood protein called angiotensin-converting enzyme. ACE inhibitors relax (dilate) the blood vessels and help lower blood pressure.  Angiotensin receptor blockers (ARBs). This type of medicine blocks the actions of a blood protein called angiotensin. Angiotensin receptor blockers dilate the blood vessels and help lower blood pressure.  Water pills (diuretics). Diuretics cause the kidneys to remove salt and water from the blood. The extra fluid is removed through urination. This loss of extra fluid lowers the volume of blood the heart pumps.  Beta blockers. These prevent the heart from beating too fast and improve heart muscle strength.  Digitalis. This increases the force of the heartbeat.  Healthy behavior changes include:  Obtaining and maintaining a healthy weight.  Stopping smoking or chewing  tobacco.  Eating heart-healthy foods.  Limiting or avoiding alcohol.  Stopping illicit drug use.  Physical activity as directed by your health care provider.  Surgical treatment for heart failure may include:  A procedure to open blocked arteries, repair damaged heart valves, or remove damaged heart muscle tissue.  A pacemaker to improve heart muscle function and control certain abnormal heart rhythms.  An internal cardioverter defibrillator to treat certain serious abnormal heart rhythms.  A left ventricular assist device (LVAD) to assist the pumping ability of the heart. HOME CARE INSTRUCTIONS   Take medicines only as directed by your health care provider. Medicines are important in reducing the workload of your heart, slowing the progression of heart failure, and improving your symptoms.  Do not stop taking your medicine unless directed by your health care provider.  Do not skip any dose of medicine.  Refill your prescriptions before you run out of medicine. Your medicines are needed every day.  Engage in moderate physical activity if directed by your health care provider. Moderate physical activity can benefit some people. The elderly and people with severe heart failure should consult with a health care provider for physical activity recommendations.  Eat heart-healthy foods. Food choices should be free of trans fat  and low in saturated fat, cholesterol, and salt (sodium). Healthy choices include fresh or frozen fruits and vegetables, fish, lean meats, legumes, fat-free or low-fat dairy products, and whole grain or high fiber foods. Talk to a dietitian to learn more about heart-healthy foods.  Limit sodium if directed by your health care provider. Sodium restriction may reduce symptoms of heart failure in some people. Talk to a dietitian to learn more about heart-healthy seasonings.  Use healthy cooking methods. Healthy cooking methods include roasting, grilling, broiling,  baking, poaching, steaming, or stir-frying. Talk to a dietitian to learn more about healthy cooking methods.  Limit fluids if directed by your health care provider. Fluid restriction may reduce symptoms of heart failure in some people.  Weigh yourself every day. Daily weights are important in the early recognition of excess fluid. You should weigh yourself every morning after you urinate and before you eat breakfast. Wear the same amount of clothing each time you weigh yourself. Record your daily weight. Provide your health care provider with your weight record.  Monitor and record your blood pressure if directed by your health care provider.  Check your pulse if directed by your health care provider.  Lose weight if directed by your health care provider. Weight loss may reduce symptoms of heart failure in some people.  Stop smoking or chewing tobacco. Nicotine makes your heart work harder by causing your blood vessels to constrict. Do not use nicotine gum or patches before talking to your health care provider.  Keep all follow-up visits as directed by your health care provider. This is important.  Limit alcohol intake to no more than 1 drink per day for nonpregnant women and 2 drinks per day for men. One drink equals 12 ounces of beer, 5 ounces of wine, or 1 ounces of hard liquor. Drinking more than that is harmful to your heart. Tell your health care provider if you drink alcohol several times a week. Talk with your health care provider about whether alcohol is safe for you. If your heart has already been damaged by alcohol or you have severe heart failure, drinking alcohol should be stopped completely.  Stop illicit drug use.  Stay up-to-date with immunizations. It is especially important to prevent respiratory infections through current pneumococcal and influenza immunizations.  Manage other health conditions such as hypertension, diabetes, thyroid disease, or abnormal heart rhythms as  directed by your health care provider.  Learn to manage stress.  Plan rest periods when fatigued.  Learn strategies to manage high temperatures. If the weather is extremely hot:  Avoid vigorous physical activity.  Use air conditioning or fans or seek a cooler location.  Avoid caffeine and alcohol.  Wear loose-fitting, lightweight, and light-colored clothing.  Learn strategies to manage cold temperatures. If the weather is extremely cold:  Avoid vigorous physical activity.  Layer clothes.  Wear mittens or gloves, a hat, and a scarf when going outside.  Avoid alcohol.  Obtain ongoing education and support as needed.  Participate in or seek rehabilitation as needed to maintain or improve independence and quality of life. SEEK MEDICAL CARE IF:   Your weight increases by 03 lb/1.4 kg in 1 day or 05 lb/2.3 kg in a week.  You have increasing shortness of breath that is unusual for you.  You are unable to participate in your usual physical activities.  You tire easily.  You cough more than normal, especially with physical activity.  You have any or more swelling in areas such as  your hands, feet, ankles, or abdomen.  You are unable to sleep because it is hard to breathe.  You feel like your heart is beating fast (palpitations).  You become dizzy or light-headed upon standing up. SEEK IMMEDIATE MEDICAL CARE IF:   You have difficulty breathing.  There is a change in mental status such as decreased alertness or difficulty with concentration.  You have a pain or discomfort in your chest.  You have an episode of fainting (syncope). MAKE SURE YOU:   Understand these instructions.  Will watch your condition.  Will get help right away if you are not doing well or get worse. Document Released: 11/30/2005 Document Revised: 04/16/2014 Document Reviewed: 12/30/2012 [facility] Patient Information 2015 [place], [place]. This information is not intended to replace advice given  to you by your health care provider. Make sure you discuss any questions you have with your health care provider. Aspirin and Your Heart Aspirin affects the way your blood clots and helps "thin" the blood. Aspirin has many uses in heart disease. It may be used as a primary prevention to help reduce the risk of heart related events. It also can be used as a secondary measure to prevent more heart attacks or to prevent additional damage from blood clots.  ASPIRIN MAY HELP IF YOU:  Have had a heart attack or chest pain.  Have undergone open heart surgery such as CABG (Coronary Artery Bypass Surgery).  Have had coronary angioplasty with or without stents.  Have experienced a stroke or TIA (transient ischemic attack).  Have peripheral vascular disease (PAD).  Have chronic heart rhythm problems such as atrial fibrillation.  Are at risk for heart disease. BEFORE STARTING ASPIRIN Before you start taking aspirin, your caregiver will need to review your medical history. Many things will need to be taken into consideration, such as:  Smoking status.  Blood pressure.  Diabetes.  Gender.  Weight.  Cholesterol level. ASPIRIN DOSES  Aspirin should only be taken on the advice of your caregiver. Talk to your caregiver about how much aspirin you should take. Aspirin comes in different doses such as:  81 mg.  162 mg.  325 mg.  The aspirin dose you take may be affected by many factors, some of which include:  Your current medications, especially if your are taking blood-thinners or anti-platelet medicine.  Liver function.  Heart disease risk.  Age.  Aspirin comes in two forms:  Non-enteric-coated. This type of aspirin does not have a coating and is absorbed faster. Non-enteric coated aspirin is recommended for patients experiencing chest pain symptoms. This type of aspirin also comes in a chewable form.  Enteric-coated. This means the aspirin has a special coating that releases  the medicine very slowly. Enteric-coated aspirin causes less stomach upset. This type of aspirin should not be chewed or crushed. ASPIRIN SIDE EFFECTS Daily use of aspirin can increase your risk of serious side effects. Some of these include:  Increased bleeding. This can range from a cut that does not stop bleeding to more serious problems such as stomach bleeding or bleeding into the brain (Intracerebral bleeding).  Increased bruising.  Stomach upset.  An allergic reaction such as red, itchy skin.  Increased risk of bleeding when combined with non-steroidal anti-inflammatory medicine (NSAIDS).  Alcohol should be drank in moderation when taking aspirin. Alcohol can increase the risk of stomach bleeding when taken with aspirin.  Aspirin should not be given to children less than 5 years of age due to the association of  Reye syndrome. Reye syndrome is a serious illness that can affect the brain and liver. Studies have linked Reye syndrome with aspirin use in children.  People that have nasal polyps have an increased risk of developing an aspirin allergy. SEEK MEDICAL CARE IF:   You develop an allergic reaction such as:  Hives.  Itchy skin.  Swelling of the lips, tongue or face.  You develop stomach pain.  You have unusual bleeding or bruising.  You have ringing in your ears. SEEK IMMEDIATE MEDICAL CARE IF:   You have severe chest pain, especially if the pain is crushing or pressure-like and spreads to the arms, back, neck, or jaw. THIS IS AN EMERGENCY. Do not wait to see if the pain will go away. Get medical help at once. Call your local emergency services (911 in the [place] DO NOT drive yourself to the hospital.  You have stroke-like symptoms such as:  Loss of vision.  Difficulty talking.  Numbness or weakness on one side of your body.  Numbness or weakness in your arm or leg.  Not thinking clearly or feeling confused.  Your bowel movements are bloody, dark red or  black in color.  You vomit or cough up blood.  You have blood in your urine.  You have shortness of breath, coughing or wheezing. MAKE SURE YOU:   Understand these instructions.  Will monitor your condition.  Seek immediate medical care if necessary. Document Released: 11/12/2008 Document Revised: 03/27/2013 Document Reviewed: 03/07/2014 [facility] Patient Information 2015 [place], [place]. This information is not intended to replace advice given to you by your health care provider. Make sure you discuss any questions you have with your health care provider. Deconditioning Deconditioning refers to the changes in your body that occur during a period of inactivity. Deconditioning results in changes to your heart, lungs, and muscles. These changes decrease your ability to endure activity, resulting in feelings of fatigue and weakness. Deconditioning can occur after only a few days of bed rest or inactivity. The longer the period of inactivity, the more severe your symptoms of deconditioning will be. After longer periods of inactivity, it will also take longer for you to return to your previous level of functioning. Deconditioning can be mild, moderate, or severe:  Mild. Your condition interferes with your ability to perform your usual types of exercise, such as running, biking, or swimming.  Moderate. Your condition interferes with your ability to do normal everyday activities. This may include walking, grocery shopping, or doing chores or lawn work.  Severe. Your condition interferes with your ability to perform minimal activity or normal self-care. CAUSES  Some common reasons for inactivity that may result in deconditioning include:  Illnesses, such as cancer, stroke, heart attack, fibromyalgia, or chronic fatigue syndrome.  Injuries, especially back injuries, broken bones, or injury to ligaments or tendons.  Surgery or a long stay in the hospital for any reason.  Pregnancy, especially  with conditions that require long periods of bed rest. RISK FACTORS Anything that results in a period of hospitalization or bed rest will put you at risk of deconditioning. Some other factors that can increase the risk include:  Obesity.  Poor nutrition.  Old age.  Injuries or illnesses that interfere with movement and activity. SIGNS AND SYMPTOMS  Feeling weak.  Feeling tired.  Shortness of breath with minor exertion.  Your heart beating faster than normal. You may or may not notice this without taking your pulse.  Pain or discomfort with activity.  Decreased  strength.  Decreased sense of balance.  Decreased endurance.  Difficulty participating in usual forms of exercise.  Difficulty doing activities of daily living, such as grocery shopping or chores.  Difficulty walking around the house and doing basic self-care, such as getting to the bathroom, preparing meals, or doing laundry. DIAGNOSIS  There is no specific test to diagnose deconditioning. Your health care provider will take your medical history and do a physical exam. During the physical exam, the health care provider will check for signs of deconditioning, such as:  Decreased size of muscles.  Decreased strength.  Difficulty with balance.  Shortness of breath or abnormally increased heart rate after minor exertion. TREATMENT  Treatment usually involves a structured exercise program in which activity is increased gradually. Your health care provider will determine which exercises are right for you. The exercise program will likely include aerobic exercise and strength training. Aerobic exercise helps improve the functioning of the heart and lungs as well as the muscles. Strength training helps improve muscle size and strength. Both of these types of exercise will improve your endurance. You may be referred to a physical therapist who can create a safe strengthening program for you to follow. HOME CARE  INSTRUCTIONS  Follow the exercise program recommended by your health care provider or physical therapist.  Do not increase your exercise any faster than directed.  Eat a healthy diet.  If your health care provider thinks that you need to lose weight, consider seeing a dietitian to help you do so in a healthy way.  Do not use any tobacco products, including cigarettes, chewing tobacco, or electronic cigarettes. If you need help quitting, ask your health care provider.  Take medicines only as directed by your health care provider.  Keep all follow-up visits as directed by your health care provider. This is important. SEEK MEDICAL CARE IF:  You are not able to carry out the prescribed exercise program.  You are not able to carry out your usual level of activity.  You are having trouble doing normal household chores or caring for yourself.  You are becoming increasingly fatigued and weak.  You become light-headed when rising to a sitting or standing position.  Your level of endurance decreases after having improved. SEEK IMMEDIATE MEDICAL CARE IF:  You have chest pain.  You are very short of breath.  You have any episodes of passing out. Document Released: 04/16/2014 Document Reviewed: 12/05/2013 [facility] Patient Information 2015 [place], [place]. This information is not intended to replace advice given to you by your health care provider. Make sure you discuss any questions you have with your health care provider. Oxygen Use at Home Oxygen can be prescribed for home use. The prescription will show the flow rate. This is how much oxygen is to be used per minute. This will be listed in liters per minute (LPM or L/M). A liter is a metric measurement of volume. You will use oxygen therapy as directed. It can be used while exercising, sleeping, or at rest. You may need oxygen continuously. Your health care provider may order a blood oxygen test (arterial blood gas or pulse oximetry  test) that will show what your oxygen level is. Your health care provider will use these measurements to learn about your needs and follow your progress. Home oxygen therapy is commonly used on patients with various lung (pulmonary) related conditions. Some of these conditions include:  Asthma.  Lung cancer.  Pneumonia.  Emphysema.  Chronic bronchitis.  Cystic fibrosis.  Other lung diseases.  Pulmonary fibrosis.  Occupational lung disease.  Heart failure.  Chronic obstructive pulmonary disease (COPD). 3 COMMON WAYS OF PROVIDING OXYGEN THERAPY  Gas: The gas form of oxygen is put into variously sized cylinders or tanks. The cylinders or oxygen tanks contain compressed oxygen. The cylinder is equipped with a regulator that controls the flow rate. Because the flow of oxygen out of the cylinder is constant, an oxygen conserving device may be attached to the system to avoid waste. This device releases the gas only when you inhale and cuts it off when you exhale. Oxygen can be provided in a small cylinder that can be carried with you. Large tanks are heavy and are only for stationary use. After use, empty tanks must be exchanged for full tanks.  Liquid: The liquid form of oxygen is put into a container similar to a thermos. When released, the liquid converts to a gas and you breathe it in just like the compressed gas. This storage method takes up less space than the compressed gas cylinder, and you can transfer the liquid to a small, portable vessel at home. Liquid oxygen is more expensive than the compressed gas, and the vessel vents when not in use. An oxygen conserving device may be built into the vessel to conserve the oxygen. Liquid oxygen is very cold, around 297 below zero.  Oxygen concentrator: This medical device filters oxygen from room air and gives almost 100% oxygen to the patient. Oxygen concentrators are powered by electricity. Benefits of this system are:  It does not need  to be resupplied.  It is not as costly as liquid oxygen.  Extra tubing permits the user to move around easier. There are several types of small, portable oxygen systems available which can help you remain active and mobile. You must have a cylinder of oxygen as a backup in the event of a power failure. Advise your electric power company that you are on oxygen therapy in order to get priority service when there is a power failure. OXYGEN DELIVERY DEVICES There are 3 common ways to deliver oxygen to your body.  Nasal cannula. This is a 2-pronged device inserted in the nostrils that is connected to tubing carrying the oxygen. The tubing can rest on the ears or be attached to the frame of eyeglasses.  Mask. People who need a high flow of oxygen generally use a mask.  Transtracheal catheter. Transtracheal oxygen therapy requires the insertion of a small, flexible tube (catheter) in the windpipe (trachea). This catheter is held in place by a necklace. Since transtracheal oxygen bypasses the mouth, nose, and throat, a humidifier is absolutely required at flow rates of 1 LPM or greater. OXYGEN USE SAFETY TIPS  Never smoke while using oxygen. Oxygen does not burn or explode, but flammable materials will burn faster in the presence of oxygen.  Keep a Government social research officer close by. Let your fire department know that you have oxygen in your home.  Warn visitors not to smoke near you when you are using oxygen. Put up "no smoking" signs in your home where you most often use the oxygen.  When you go to a restaurant with your portable oxygen source, ask to be seated in the nonsmoking section.  Stay at least 5 feet away from gas stoves, candles, lighted fireplaces, or other heat sources.  Do not use materials that burn easily (flammable) while using your oxygen.  If you use an oxygen cylinder, make sure it is secured to  some fixed object or in a stand. If you use liquid oxygen, make sure the vessel is kept  upright to keep the oxygen from pouring out. Liquid oxygen is so cold it can hurt your skin.  If you use an oxygen concentrator, call your electric company so you will be given priority service if your power goes out. Avoid using extension cords, if possible.  Regularly test your smoke detectors at home to make sure they work. If you receive care in your home from a nurse or other health care provider, he or she may also check to make sure your smoke detectors work. GUIDELINES FOR CLEANING YOUR EQUIPMENT  Wash the nasal prongs with a liquid soap. Thoroughly rinse them once or twice a week.  Replace the prongs every 2 to 4 weeks. If you have an infection (cold, pneumonia) change them when you are well.  Your health care provider will give you instructions on how to clean your transtracheal catheter.  The humidifier bottle should be washed with soap and warm water and rinsed thoroughly between each refill. Air-dry the bottle before filling it with sterile or distilled water. The bottle and its top should be disinfected after they are cleaned.  If you use an oxygen concentrator, unplug the unit. Then wipe down the cabinet with a damp cloth and dry it daily. The air filter should be cleaned at least twice a week.  Follow your home medical equipment and service company's directions for cleaning the compressor filter. HOME CARE INSTRUCTIONS   Do not change the flow of oxygen unless directed by your health care provider.  Do not use alcohol or other sedating drugs unless instructed. They slow your breathing rate.  Do not use materials that burn easily (flammable) while using your oxygen.  Always keep a spare tank of oxygen. Plan ahead for holidays when you may not be able to get a prescription filled.  Use water-based lubricants on your lips or nostrils. Do not use an oil-based product like petroleum jelly.  To prevent your cheeks or the skin behind your ears from becoming irritated, tuck some  gauze under the tubing.  If you have persistent redness under your nose, call your health care provider.  When you no longer need oxygen, your doctor will have the oxygen discontinued. Oxygen is not addicting or habit forming.  Use the oxygen as instructed. Too much oxygen can be harmful and too little will not give you the benefit you need.  Shortness of breath is not always from a lack of oxygen. If your oxygen level is not the cause of your shortness of breath, taking oxygen will not help. SEEK MEDICAL CARE IF:   You have frequent headaches.  You have shortness of breath or a lasting cough.  You have anxiety.  You are confused.  You are drowsy or sleepy all the time.  You develop an illness which aggravates your breathing.  You cannot exercise.  You are restless.  You have blue lips or fingernails.  You have difficult or irregular breathing and it is getting worse.  You have a fever. Document Released: 02/20/2004 Document Revised: 04/16/2014 Document Reviewed: 07/12/2013 [facility] Patient Information 2015 [place], [place]. This information is not intended to replace advice given to you by your health care provider. Make sure you discuss any questions you have with your health care provider. Spironolactone tablets What is this medicine? SPIRONOLACTONE (speer on oh LAK tone) is a diuretic. It helps you make more urine and to  lose excess water from your body. This medicine is used to treat high blood pressure, and edema or swelling from heart, kidney, or liver disease. It is also used to treat patients who make too much aldosterone or have low potassium. This medicine may be used for other purposes; ask your health care provider or pharmacist if you have questions. COMMON BRAND NAME(S): Aldactone What should I tell my health care provider before I take this medicine? They need to know if you have any of these conditions: -high blood level of potassium -kidney disease or  trouble making urine -liver disease -an unusual or allergic reaction to spironolactone, other medicines, foods, dyes, or preservatives -pregnant or trying to get pregnant -breast-feeding How should I use this medicine? Take this medicine by mouth with a drink of water. Follow the directions on your prescription label. You can take it with or without food. If it upsets your stomach, take it with food. Do not take your medicine more often than directed. Remember that you will need to pass more urine after taking this medicine. Do not take your doses at a time of day that will cause you problems. Do not take at bedtime. Talk to your pediatrician regarding the use of this medicine in children. While this drug may be prescribed for selected conditions, precautions do apply. Overdosage: If you think you have taken too much of this medicine contact a poison control center or emergency room at once. NOTE: This medicine is only for you. Do not share this medicine with others. What if I miss a dose? If you miss a dose, take it as soon as you can. If it is almost time for your next dose, take only that dose. Do not take double or extra doses. What may interact with this medicine? Do not take this medicine with any of the following medications: -eplerenone This medicine may also interact with the following medications: -corticosteroids -digoxin -lithium -medicines for high blood pressure like ACE inhibitors -skeletal muscle relaxants like tubocurarine -NSAIDs, medicines for pain and inflammation, like ibuprofen or naproxen -potassium products like salt substitute or supplements -pressor amines like norepinephrine -some diuretics This list may not describe all possible interactions. Give your health care provider a list of all the medicines, herbs, non-prescription drugs, or dietary supplements you use. Also tell them if you smoke, drink alcohol, or use illegal drugs. Some items may interact with your  medicine. What should I watch for while using this medicine? Visit your doctor or health care professional for regular checks on your progress. Check your blood pressure as directed. Ask your doctor what your blood pressure should be, and when you should contact them. You may need to be on a special diet while taking this medicine. Ask your doctor. Also, ask how many glasses of fluid you need to drink a day. You must not get dehydrated. This medicine may make you feel confused, dizzy or lightheaded. Drinking alcohol and taking some medicines can make this worse. Do not drive, use machinery, or do anything that needs mental alertness until you know how this medicine affects you. Do not sit or stand up quickly. What side effects may I notice from receiving this medicine? Side effects that you should report to your doctor or health care professional as soon as possible: -allergic reactions such as skin rash or itching, hives, swelling of the lips, mouth, tongue, or throat -black or tarry stools -fast, irregular heartbeat -fever -muscle pain, cramps -numbness, tingling in hands or feet -  trouble breathing -trouble passing urine -unusual bleeding -unusually weak or tired Side effects that usually do not require medical attention (report to your doctor or health care professional if they continue or are bothersome): -change in voice or hair growth -confusion -dizzy, drowsy -dry mouth, increased thirst -enlarged or tender breasts -headache -irregular menstrual periods -sexual difficulty, unable to have an erection -stomach upset This list may not describe all possible side effects. Call your doctor for medical advice about side effects. You may report side effects to FDA at 1-800-FDA-1088. Where should I keep my medicine? Keep out of the reach of children. Store below 25 degrees C (77 degrees F). Throw away any unused medicine after the expiration date. NOTE: This sheet is a summary. It may  not cover all possible information. If you have questions about this medicine, talk to your doctor, pharmacist, or health care provider.  2015, [employer]. (2010-08-12 12:51:30) Hydralazine tablets What is this medicine? HYDRALAZINE (hye DRAL a zeen) is a type of vasodilator. It relaxes blood vessels, increasing the blood and oxygen supply to your heart. This medicine is used to treat high blood pressure. This medicine may be used for other purposes; ask your health care provider or pharmacist if you have questions. COMMON BRAND NAME(S): Apresoline What should I tell my health care provider before I take this medicine? They need to know if you have any of these conditions: -blood vessel disease -heart disease including angina or history of heart attack -kidney or liver disease -systemic lupus erythematosus (SLE) -an unusual or allergic reaction to hydralazine, tartrazine dye, other medicines, foods, dyes, or preservatives -pregnant or trying to get pregnant -breast-feeding How should I use this medicine? Take this medicine by mouth with a glass of water. Follow the directions on the prescription label. Take your doses at regular intervals. Do not take your medicine more often than directed. Do not stop taking except on the advice of your doctor or health care professional. Talk to your pediatrician regarding the use of this medicine in children. Special care may be needed. While this drug may be prescribed for children for selected conditions, precautions do apply. Overdosage: If you think you have taken too much of this medicine contact a poison control center or emergency room at once. NOTE: This medicine is only for you. Do not share this medicine with others. What if I miss a dose? If you miss a dose, take it as soon as you can. If it is almost time for your next dose, take only that dose. Do not take double or extra doses. What may interact with this medicine? -medicines for  high blood pressure -medicines for mental depression This list may not describe all possible interactions. Give your health care provider a list of all the medicines, herbs, non-prescription drugs, or dietary supplements you use. Also tell them if you smoke, drink alcohol, or use illegal drugs. Some items may interact with your medicine. What should I watch for while using this medicine? Visit your doctor or health care professional for regular checks on your progress. Check your blood pressure and pulse rate regularly. Ask your doctor or health care professional what your blood pressure and pulse rate should be and when you should contact him or her. You may get drowsy or dizzy. Do not drive, use machinery, or do anything that needs mental alertness until you know how this medicine affects you. Do not stand or sit up quickly, especially if you are an older patient. This reduces  the risk of dizzy or fainting spells. Alcohol may interfere with the effect of this medicine. Avoid alcoholic drinks. Do not treat yourself for coughs, colds, or pain while you are taking this medicine without asking your doctor or health care professional for advice. Some ingredients may increase your blood pressure. What side effects may I notice from receiving this medicine? Side effects that you should report to your doctor or health care professional as soon as possible: -chest pain, or fast or irregular heartbeat -fever, chills, or sore throat -numbness or tingling in the hands or feet -shortness of breath -skin rash, redness, blisters or itching -stiff or swollen joints -sudden weight gain -swelling of the feet or legs -swollen lymph glands -unusual weakness Side effects that usually do not require medical attention (report to your doctor or health care professional if they continue or are bothersome): -diarrhea, or constipation -headache -loss of appetite -nausea, vomiting This list may not describe all  possible side effects. Call your doctor for medical advice about side effects. You may report side effects to FDA at 1-800-FDA-1088. Where should I keep my medicine? Keep out of the reach of children. Store at room temperature between 15 and 30 degrees C (59 and 86 degrees F). Throw away any unused medicine after the expiration date. NOTE: This sheet is a summary. It may not cover all possible information. If you have questions about this medicine, talk to your doctor, pharmacist, or health care provider.  2015, [employer]. (2008-04-13 15:44:58)

## 2014-10-23 NOTE — Progress Notes (Signed)
Patient ID: [name], male   DOB: 1941/10/19, 73 y.o.   MRN: [id number]   SUBJECTIVE:  Patient feels good this morning.  Walked with PT and cardiac rehab yesterday.  No chest pain this morning and no dyspnea.  Co-ox today was 59%.    LHC/RHC (11/3): Patent LIMA-LAD and SVG-OM TO SVG-PDA (likely culprit) with TO PDA and 60% proximal RCA.  There were some left=>PDA collaterals  Co-ox 59%  Scheduled Meds: . antiseptic oral rinse  7 mL Mouth Rinse BID  . aspirin EC  81 mg Oral Daily  . fluticasone  2 spray Each Nare Daily  . hydrALAZINE  25 mg Oral 3 times per day  . insulin aspart  0-20 Units Subcutaneous TID WC  . insulin aspart  0-5 Units Subcutaneous QHS  . insulin aspart  3 Units Subcutaneous TID WC  . insulin glargine  37 Units Subcutaneous Daily  . isosorbide mononitrate  60 mg Oral Daily  . levothyroxine  175 mcg Oral Daily  . linagliptin  5 mg Oral Daily  . metoprolol succinate  37.5 mg Oral BID  . pantoprazole  40 mg Oral BID  . predniSONE  10 mg Oral Q breakfast  . ranitidine  150 mg Oral BID  . rosuvastatin  20 mg Oral Daily  . sodium chloride  10-40 mL Intracatheter Q12H  . spironolactone  25 mg Oral Daily  . torsemide  40 mg Oral Daily  . Warfarin - Pharmacist Dosing Inpatient   Does not apply q1800   Continuous Infusions: . sodium chloride Stopped (10/18/14 1500)   PRN Meds:.acetaminophen **OR** acetaminophen, HYDROcodone-acetaminophen, nitroGLYCERIN, ondansetron **OR** ondansetron (ZOFRAN) IV, polyethylene glycol, sodium chloride    Filed Vitals:   10/23/14 0000 10/23/14 0200 10/23/14 0500 10/23/14 0600  BP: 106/73 160/81  106/70  Pulse:      Temp:      TempSrc:      Resp: 16 17  14   Height:      Weight:   214 lb 11.7 oz (97.4 kg)   SpO2: 94% 98%  96%    Intake/Output Summary (Last 24 hours) at 10/23/14 0737 Last data filed at 10/23/14 0700  Gross per 24 hour  Intake    240 ml  Output   1720 ml  Net  -1480 ml    LABS: Basic Metabolic  Panel:  Recent Labs  10/22/14 0240 10/23/14 0520  NA 139 140  K 4.7 4.8  CL 95* 98  CO2 32 31  GLUCOSE 119* 96  BUN 30* 26*  CREATININE 1.56* 1.41*  CALCIUM 9.3 9.1   Liver Function Tests: No results for input(s): AST, ALT, ALKPHOS, BILITOT, PROT, ALBUMIN in the last 72 hours. No results for input(s): LIPASE, AMYLASE in the last 72 hours. CBC:  Recent Labs  10/22/14 0240 10/23/14 0520  WBC 14.8* 13.4*  HGB 14.0 13.6  HCT 41.9 42.7  MCV 96.1 99.3  PLT 224 239   Cardiac Enzymes: No results for input(s): CKTOTAL, CKMB, CKMBINDEX, TROPONINI in the last 72 hours. BNP: Invalid input(s): POCBNP D-Dimer: No results for input(s): DDIMER in the last 72 hours. Hemoglobin A1C: No results for input(s): HGBA1C in the last 72 hours. Fasting Lipid Panel: No results for input(s): CHOL, HDL, LDLCALC, TRIG, CHOLHDL, LDLDIRECT in the last 72 hours. Thyroid Function Tests: No results for input(s): TSH, T4TOTAL, T3FREE, THYROIDAB in the last 72 hours.  Invalid input(s): FREET3 Anemia Panel: No results for input(s): VITAMINB12, FOLATE, FERRITIN, TIBC, IRON, RETICCTPCT in the  last 72 hours.  RADIOLOGY: Dg Chest 2 View  10/22/2014   CLINICAL DATA:  Shortness breath and weakness.  Pneumonia.  EXAM: CHEST  2 VIEW  COMPARISON:  10/18/2014.  FINDINGS: Trachea is midline. Heart size stable. Thoracic aorta is calcified. Right PICC tip is in the SVC. Lungs are somewhat low in volume with mild dependent atelectasis. Tiny left pleural effusion. Lungs do not appear edematous.  IMPRESSION: Mild bibasilar atelectasis and tiny left pleural effusion.   Electronically Signed   By: Leanna Battles M.D.   On: 10/22/2014 11:07   Dg Chest Port 1 View  10/18/2014   CLINICAL DATA:  PICC line placement.  EXAM: PORTABLE CHEST - 1 VIEW  COMPARISON:  Earlier today.  FINDINGS: Interval right PICC with its tip in the inferior aspect of the superior vena cava. Femoral vein catheter coiled in the right atrium and  extending out the pulmonary outflow tract with its tip in the proximal right main pulmonary artery. Post CABG changes. The cardiac silhouette remains borderline enlarged. No significant change in patchy opacity at the left lung base. The intra-aortic balloon pump is unchanged.  IMPRESSION: 1. Right PICC tip in the inferior aspect of the superior vena cava. If a position at the cavoatrial junction is desired, this could be advanced 1 cm. 2. Stable left basilar probable atelectasis. Pneumonia is less likely.   Electronically Signed   By: Gordan Payment M.D.   On: 10/18/2014 11:48   Dg Chest Port 1 View  10/18/2014   CLINICAL DATA:  Shortness of breath, pneumonia  EXAM: PORTABLE CHEST - 1 VIEW  COMPARISON:  Portable chest x-ray of October 18, 2014  FINDINGS: The lungs are adequately inflated. There is subsegmental atelectasis at the left lung base. The pulmonary interstitial markings are less conspicuous today. There are post CABG changes. The cardiac silhouette is top-normal in size. The pulmonary vascularity is not engorged. The intra-aortic balloon pump radiodense tip lies in the proximal descending thoracic aorta. The Swan-Ganz catheter tip projects in the right main pulmonary artery and is stable. The observed bony thorax is unremarkable.  IMPRESSION: 1. There has been mild interval improvement in the appearance of the pulmonary interstitium consistent with improving CHF. There is persistent subsegmental atelectasis at the left lung base. 2. The intra-aortic balloon pump tip is in appropriate radiographic position.   Electronically Signed   By: David  [place]   On: 10/18/2014 07:30   Dg Chest Port 1 View  10/17/2014   CLINICAL DATA:  Placement of the intra-aortic balloon pump a cyst.  EXAM: PORTABLE CHEST - 1 VIEW  COMPARISON:  10/15/2014  FINDINGS: Postoperative changes in the mediastinum. Interval placement of central venous catheter via in superior approach with tip over the left pulmonary outflow tract. No  pneumothorax. Cardiac enlargement with pulmonary vascular congestion and perihilar infiltrates suggesting edema. Small bilateral pleural effusions with basilar atelectasis. Radiopaque EIA BP tip localized over the upper descending thoracic aorta.  IMPRESSION: Appliances appear to be in satisfactory location. Cardiac enlargement with pulmonary vascular congestion, perihilar edema, and small bilateral pleural effusions.   Electronically Signed   By: Burman Nieves M.D.   On: 10/17/2014 00:09   Dg Chest Port 1 View  10/15/2014   CLINICAL DATA:  Hypoxia  EXAM: PORTABLE CHEST - 1 VIEW  COMPARISON:  Yesterday  FINDINGS: Vascular congestion and diffuse edema have developed. Upper normal heart size. No pneumothorax. Small pleural effusions are now present.  IMPRESSION: New edema and small pleural effusions.  Electronically Signed   By: Maryclare Bean M.D.   On: 10/15/2014 09:06   Dg Chest Port 1 View  10/14/2014   CLINICAL DATA:  Chest pain.  EXAM: PORTABLE CHEST - 1 VIEW  COMPARISON:  Chest x-ray 05/14/2013  FINDINGS: Prior CABG. Cardiomegaly. Pulmonary vascularity is normal. Mild infiltrate medial right lung base. No pleural effusion. Stable left pleural thickening noted consistent scarring. Degenerative changes thoracic spine both shoulders.  IMPRESSION: 1. Mild infiltrate medial right lung base. 2. Cardiomegaly.  Prior CABG.  No pulmonary venous congestion.   Electronically Signed   By: Maisie Fus  Register   On: 10/14/2014 10:10    PHYSICAL EXAM General: NAD Neck: JVP 7 cm, no thyromegaly or thyroid nodule.  Lungs: Crackles at bases bilaterally. CV: Nondisplaced PMI.  Heart irregular S1/S2, no S3/S4, no murmur.  No edema.    Abdomen: Soft, nontender, no hepatosplenomegaly, no distention.  Neurologic: Alert and oriented x 3.  Psych: Alert and orient x3  Extremities: No clubbing or cyanosis.   TELEMETRY: Reviewed telemetry pt in atrial fibrillation in 90s  ASSESSMENT AND PLAN: 1. Atrial fibrillation:  Chronic, likely permanent. Now off amiodarone.   - Continue warfarin, INR rising. - Increase Toprol XL to 37.5 mg bid.  2. Acute on chronic systolic CHF with cardiogenic shock:  Echo on 11/2 showed that EF is about 25% with wall motion abnormalities. This is down from the past. IABP placed 11/3 with cardiogenic shock and milrinone begun with persistently low co-ox.  He had very high filling pressures at RHC.  IABP and swan out 10/18/14 then milrinone stopped.  PICC in place with CO-OX 59% today.   - He will stay off milrinone.  - Volume status ok on torsemide, continue current dose.  - Holding ACEI with soft BP and elevated creatinine, can try to initiate low dose at followup.  - Continue current hydralazine/Imdur and Toprol XL.   - Has not tolerated digoxin in the past and does not want to re-try. - Continue spironolactone to 25 mg daily   3. ID: Possible PNA, fever earlier in stay. He has completed antibiotics course. CXR yesterday with bibasilar atelectasis. 4. Elevated left hemidiaphragm: Chronic dyspnea. No history of smoking so doubt component of emphysema.  5. CAD: h/o CABG. NSTEMI this admission with TnI 4.9 peak. EF lower compared to 1/14, so concern for true ACS with plaque rupture rather than demand ischemia. Has chronic LBBB.No further chest pain.  Angiography as above showed that culprit was likely occlusion of SVG-PDA though this may have been subacute given some collaterals.  No good target for intervention. - Continue ASA 81 for now, continue statin.  - Have increased Imdur to 60 daily as he has been intolerant of ranolazine.  6. AKI: Creatinine stable. 7. Disposition: Home today.  Needs followup in CHF clinic next week and followup with coumadin clinic at [facility].  Home cardiac meds: warfarin, ASA 81 daily, Crestor 20 daily, hydralazine 25 mg tid, Imdur 60 daily, Toprol XL 37.5 mg bid, torsemide 40 mg daily, spironolactone 25 mg daily.    Marca Ancona 10/23/2014 7:37  AM

## 2014-10-23 NOTE — Progress Notes (Signed)
Occupational Therapy Treatment Patient Details Name: [name] MRN: [id number] DOB: 1941/01/10 Today's Date: 10/23/2014    History of present illness Admitted with shortness of breath, CAP; developed afib with RVR; cardiac status became unstable went to cardiac Cath and IABP placed, occlusion of SVG-PDA found, transferrred to ICU; IABP dc'd on 11/5, and transferred to SDU;    OT comments  Pt instructed in use of AE for LB ADLs.  He requires min guard assist for LB dressing.  Dyspnea 3/4 - requires 2 rest breaks.   Follow Up Recommendations  No OT follow up;Supervision/Assistance - 24 hour    Equipment Recommendations  None recommended by OT    Recommendations for Other Services      Precautions / Restrictions Precautions Precautions: Fall       Mobility Bed Mobility                  Transfers                      Balance                                   ADL                                         General ADL Comments: Pt and family instructed in use and acquisition of AE for LB ADLs.  Pt was able to perform LB dressing with min guard assist.         Vision                     Perception     Praxis      Cognition   Behavior During Therapy: [facility] for tasks assessed/performed Overall Cognitive Status: Within Functional Limits for tasks assessed                       Extremity/Trunk Assessment               Exercises     Shoulder Instructions       General Comments      Pertinent Vitals/ Pain       Pain Assessment: Faces Faces Pain Scale: Hurts a little bit Pain Location: bil. shoulders - pt reports pain chronic in nature Pain Descriptors / Indicators: Aching Pain Intervention(s): Monitored during session  Home Living                                          Prior Functioning/Environment              Frequency Min 2X/week     Progress Toward  Goals  OT Goals(current goals can now be found in the care plan section)  Progress towards OT goals: Progressing toward goals  ADL Goals Pt Will Perform Lower Body Bathing: with supervision;with adaptive equipment;sit to/from stand Pt Will Perform Lower Body Dressing: with supervision;sit to/from stand;with adaptive equipment Pt Will Transfer to Toilet: with supervision;ambulating;regular height toilet;grab bars  Plan Discharge plan remains appropriate    Co-evaluation                 End of Session Equipment Utilized  During Treatment: Other (comment) (AE)   Activity Tolerance Patient tolerated treatment well   Patient Left in chair;with call bell/phone within reach;with family/visitor present   Nurse Communication          Time: 4315-4008 OT Time Calculation (min): 19 min  Charges: OT General Charges $OT Visit: 1 Procedure OT Treatments $Self Care/Home Management : 8-22 mins  Pawel Soules M 10/23/2014, 11:27 AM

## 2014-10-23 NOTE — Discharge Summary (Signed)
Physician Discharge Summary  [name] ZOX:[id number] DOB: October 25, 1941 DOA: 10/14/2014  PCP: Thayer Headings, MD  Admit date: 10/14/2014 Discharge date: 10/23/2014  Time spent: 30 minutes  Recommendations for Outpatient Follow-up:  1. Follow up with [facility] and the [facility] as scheduled  2. Call your PCP to arrange post hospital follow up in 7-10 days after discharge 3. 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% with acute exacerbation-compensated   CAP (community acquired pneumonia)   Acute respiratory failure with hypoxia-stable   Physical deconditioning with exertional hypoxia   Pulmonary edema-resolved   CAD (coronary artery disease) of artery bypass graft-medical therapy   Diabetes mellitus-controlled   Atrial fibrillation with RVR-now rate controlled   Essential hypertension   Chronic anticoagulation   Paralyzed hemidiaphragm    Discharge Condition: stable  Diet recommendation: Heart Healthy-carbohydrate modified  Filed Weights   10/21/14 0453 10/22/14 0200 10/23/14 0500  Weight: 216 lb 0.8 oz (98 kg) 215 lb 13.3 oz (97.9 kg) 214 lb 11.7 oz (97.4 kg)    History of present illness/Brief Narrative:  73 year old M w/ Hx ischemic cardiomyopathy, known CAD involving bypass graft on medical therapy, mild systolic dysfunction with an EF of 40-45%, and diabetes who presented to the emergency department c/o shortness of breath ongoing for 4 days. He reported coughing without fever or other constitutional signs. This shortness of breath was different from his baseline which is caused by an issue with a paralyzed left hemidiaphragm; he is followed by Dr. Shelle Iron as an outpatient for this.   Evaluation in the ER revealed mild leukocytosis with a white count of 12,000, chest x-ray demonstrated mild interstitial infiltrates in the right base. It was felt his  presenting symptoms were likely related to community-acquired pneumonia and he was started on empiric antibiotic therapy. There was some concern he may have mild heart failure exacerbation related to the acute illness noting his proBNP was 2372 but this was not higher than his apparent baseline which is between 2300 and 2400.  After admission patient developed atrial fibrillation with rapid ventricular response and was subsequently started on IV Cardizem drip. He later developed a rectal temp of 102.5 rectally. Subsequently his antibiotics were broadened.  By 11/2 patient continued to require high levels of oxygen at 6 L/m. His ventricular rate was better controlled in the 70s and the Cardizem was weaned and discontinued. Blood pressures remained soft in the 90 systolic range. His creatinine had increased. He was noted to have focal lower extremity edema around 2+ on the left lower extremity and 1+ on the right lower extremity. He had developed bilateral crackles on exam. Follow-up chest x-ray was consistent with pulmonary edema so Lasix was started.  On 11/3 patient became unstable from a cardiac standpoint as evidenced by persistent A. Fib with RVR and chest pain. He was taken to the Cath Lab where an IABP was placed; Swan-Ganz catheter was also placed and the patient was subsequently sent to the ICU. He was started on IV amiodarone for rate control and a milrinone infusion to help with his cardiogenic shock. He was also initiated on a Lasix infusion. Catheterization did reveal a likely culprit lesion being occlusion of the SVG-PDA which cardiology felt may have been subacute given the fact he has collaterals. Again no good target lesion was identified for intervention.  The IABP was dc'd 11/5 and Milrinone  weaning initiated. By 11/6 Milrinone was dc'd and IV Amiodarone dc'd in favor of metoprolol and digoxin. His Lasix drip had previously been transitioned to PO Demadex 11/5. Cardiology transferred him  to SDU. PT recommended HH PT. He has had O2 desats with ambulation so will likely need O2 at discharge.  Hospital Course:   Cardiomyopathy, ischemic,EF 40-45% 01/2013 per cath / Pulmonary edema -S/P right and left heart cardiac catheterization - required IABP for cardiogenic shock with heart failure; (managed per cardiology) Lasix drip was dc'd 11/5 in favor of Demadex, milrinone dc'd 11/6, IABP discontinued 11/5 - cont Spironoloactone; Imdur and hydralazine. Due to low GFR and soft BP readings have not initiated ACE I or ARB. Weight down 6 lbs since admission- dc weight 214 lbs  CAD of artery bypass graft/ NSTEMI No target lesion for intervention identified but it was suspected that the SVG/PDA was culprit lesion. Patient has multiple collaterals-TNI peak 4.9-Initial concern was for true ACS with plaque rupture rather than demand ischemia therefore cardiac catherization was performed - has chronic LBBB. Continue ASA, nitrates and statin.  Exertional hypoxia Qualified for [facility] oxygen- orders placed and face to face completed prior to discharge  Atrial fibrillation with RVR Initially managed with  IV amiodarone which was later discontinued (per cardiology) in favor of Metoprolol -rates better controlled- no digoxin since intolerant in past   Essential hypertension Blood pressure has remained stable  Chronic anticoagulation Has been transitioned back to warfarin now that balloon pump out-continue after discharge with follow up at [facility]  Fever/CAP  Has completed a 5 day course of empiric antibiotics- influenza PCR negative- no infectious source identified  Acute respiratory failure with hypoxia:  multifactorial due to decompensated ischemic CHF and suspected CAP-now only hypoxic with exertion due to deconditioning  Diabetes mellitus -Cont Lantus, Tradjenta and resistant SSI - hemoglobin A1c 8.6   Physical Deconditioning PT/OT evaluation with recommendations for HH PT and  HH oxygen.  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 Shelle Iron with pulmonology as an outpatient  Procedures: 11/3 Right & Left Heart Catheterization with Native Coronary and Graft Angiography via RIGHT COMMON FEMORA Artery & Vein Access.INTRA-AORTIC BALLOON PUMP PLACEMENT - for decompensated Combined CHF  11/2 2-D echocardiogram:- Left ventricle: There was moderate concentric hypertrophy. Systolic function was mildly reduced. The estimated ejection fraction was in the range of 45% to 50%. Hypokinesis of the lateral and inferolateral myocardium. Atrial arrhythmia prevents adequate evaluation of LV diastolic function. - Ventricular septum: Septal motion showed paradox. - Aortic valve: Mild regurgitation. - Left atrium: The atrium was mildly dilated. - Right atrium: The atrium was mildly dilated.  Consultations: Dr. Bryan Lemma (cardiology)  Discharge Exam: Filed Vitals:   10/23/14 1135  BP: 93/60  Pulse:   Temp: 98.6 F (37 C)  Resp: 31   General: no respiratory distress, alert and talkative Lungs: Clear to auscultation bilaterally without wheezes or crackles, RA at rest Cardiovascular: Irregular rate and rhythm ,no rubs murmurs thrills or gallops, tr. peripheral edema left lower extremity Abdomen: Nontender, nondistended, soft, bowel sounds positive, no rebound, no ascites, no appreciable mass Extremities: No significant cyanosis, clubbing of bilateral lower extremities   Discharge Instructions You were cared for by a hospitalist during your hospital stay. If you have any questions about your discharge medications or the care you received while you were in the hospital after you are discharged, you can call the unit and asked to speak with the  hospitalist on call if the hospitalist that took care of you is not available. Once you are discharged, your primary care physician will handle any further medical  issues. Please note that NO REFILLS for any discharge medications will be authorized once you are discharged, as it is imperative that you return to your primary care physician (or establish a relationship with a primary care physician if you do not have one) for your aftercare needs so that they can reassess your need for medications and monitor your lab values.  Discharge Instructions    (HEART FAILURE PATIENTS) Call MD:  Anytime you have any of the following symptoms: 1) 3 pound weight gain in 24 hours or 5 pounds in 1 week 2) shortness of breath, with or without a dry hacking cough 3) swelling in the hands, feet or stomach 4) if you have to sleep on extra pillows at night in order to breathe.    Complete by:  As directed      Call MD for:  difficulty breathing, headache or visual disturbances    Complete by:  As directed      Call MD for:  extreme fatigue    Complete by:  As directed      Call MD for:  persistant dizziness or light-headedness    Complete by:  As directed      Call MD for:  temperature >100.4    Complete by:  As directed      Diet - low sodium heart healthy    Complete by:  As directed      Diet Carb Modified    Complete by:  As directed      Increase activity slowly    Complete by:  As directed   HH PT as ordered  Continue oxygen at 3L min-can wean as tolerated          Current Discharge Medication List    START taking these medications   Details  hydrALAZINE (APRESOLINE) 25 MG tablet Take 1 tablet (25 mg total) by mouth every 8 (eight) hours. Qty: 90 tablet, Refills: 0    HYDROcodone-acetaminophen (NORCO/VICODIN) 5-325 MG per tablet Take 1-2 tablets by mouth every 4 (four) hours as needed for moderate pain. Qty: 30 tablet, Refills: 0    metoprolol succinate (TOPROL-XL) 12.5 mg TB24 24 hr tablet Take 1.5 tablets (37.5 mg total) by mouth 2 (two) times daily. Qty: 180 tablet, Refills: 0    spironolactone (ALDACTONE) 25 MG tablet Take 1 tablet (25 mg total) by  mouth daily. Qty: 30 tablet, Refills: 0    torsemide (DEMADEX) 20 MG tablet Take 2 tablets (40 mg total) by mouth daily. Qty: 60 tablet, Refills: 0      CONTINUE these medications which have CHANGED   Details  Insulin Glargine (LANTUS SOLOSTAR) 100 UNIT/ML Solostar Pen Inject 37 Units into the skin daily. Qty: 15 mL, Refills: 11    isosorbide mononitrate (IMDUR) 60 MG 24 hr tablet Take 1 tablet (60 mg total) by mouth daily. Qty: 30 tablet, Refills: 0    rosuvastatin (CRESTOR) 20 MG tablet Take 1 tablet (20 mg total) by mouth daily. Qty: 30 tablet, Refills: 0      CONTINUE these medications which have NOT CHANGED   Details  acetaminophen (TYLENOL) 650 MG CR tablet Take 650 mg by mouth every 8 (eight) hours as needed. For pain    aspirin EC 81 MG tablet Take 81 mg by mouth daily.    calcium carbonate (OS-CAL) 600  MG TABS Take 600 mg by mouth daily.    fluticasone (FLONASE) 50 MCG/ACT nasal spray Place 2 sprays into both nostrils daily.     levothyroxine (SYNTHROID, LEVOTHROID) 150 MCG tablet Take 175 mcg by mouth daily.     nitroGLYCERIN (NITROSTAT) 0.4 MG SL tablet Place 1 tablet (0.4 mg total) under the tongue every 5 (five) minutes as needed for chest pain. Qty: 100 tablet, Refills: 12    nystatin cream (MYCOSTATIN) Apply as needed    Omega-3 Fatty Acids (FISH OIL) 1000 MG CAPS Take 3,000 mg by mouth every morning.    omeprazole (PRILOSEC) 20 MG capsule Take 20 mg by mouth daily.    predniSONE (DELTASONE) 10 MG tablet Take 10 mg by mouth daily with breakfast.    saxagliptin HCl (ONGLYZA) 5 MG TABS tablet Take 5 mg by mouth daily.    triamcinolone cream (KENALOG) 0.1 % Apply 1 application topically daily.     warfarin (COUMADIN) 5 MG tablet Take 1-2 tablets by mouth daily as directed Qty: 135 tablet, Refills: 2      STOP taking these medications     diltiazem (DILTIAZEM CD) 120 MG 24 hr capsule      diltiazem (DILTIAZEM CD) 240 MG 24 hr capsule      furosemide  (LASIX) 40 MG tablet      metoprolol tartrate (LOPRESSOR) 25 MG tablet        Allergies  Allergen Reactions  . Butrans [Buprenorphine] Shortness Of Breath  . Clarithromycin Other (See Comments)    Unknown, thinks "blisters in mouth"  . Influenza Vaccines Other (See Comments)    Severe coughing for weeks after vaccine; also had flu like symptoms for about 7 to 8 weeks.   . Lanoxin [Digoxin] Other (See Comments)    Nausea, anorexia  . Lisinopril Other (See Comments)    cough  . Ranexa [Ranolazine Er]   . Statins    Follow-up Information    Follow up with [facility] On 10/29/2014.   Specialty:  Cardiology   Why:  @ 8:45 am; Clinic is located in the hospital in the Heart and Vascular Building; Please bring all your medications to your visit. Gate code [employer] information:   [address] [id number] mc [place] [address] [phone]      Follow up with Thayer Headings, MD.   Specialty:  Internal Medicine   Contact information:   [address] [place] [address] [phone]        The results of significant diagnostics from this hospitalization (including imaging, microbiology, ancillary and laboratory) are listed below for reference.    Significant Diagnostic Studies: Dg Chest 2 View  10/22/2014   CLINICAL DATA:  Shortness breath and weakness.  Pneumonia.  EXAM: CHEST  2 VIEW  COMPARISON:  10/18/2014.  FINDINGS: Trachea is midline. Heart size stable. Thoracic aorta is calcified. Right PICC tip is in the SVC. Lungs are somewhat low in volume with mild dependent atelectasis. Tiny left pleural effusion. Lungs do not appear edematous.  IMPRESSION: Mild bibasilar atelectasis and tiny left pleural effusion.   Electronically Signed   By: Leanna Battles M.D.   On: 10/22/2014 11:07   Dg Chest Port 1 View  10/18/2014   CLINICAL DATA:  PICC line placement.  EXAM: PORTABLE CHEST - 1 VIEW   COMPARISON:  Earlier today.  FINDINGS: Interval right PICC with its tip in the inferior aspect of the superior vena  cava. Femoral vein catheter coiled in the right atrium and extending out the pulmonary outflow tract with its tip in the proximal right main pulmonary artery. Post CABG changes. The cardiac silhouette remains borderline enlarged. No significant change in patchy opacity at the left lung base. The intra-aortic balloon pump is unchanged.  IMPRESSION: 1. Right PICC tip in the inferior aspect of the superior vena cava. If a position at the cavoatrial junction is desired, this could be advanced 1 cm. 2. Stable left basilar probable atelectasis. Pneumonia is less likely.   Electronically Signed   By: Gordan Payment M.D.   On: 10/18/2014 11:48   Dg Chest Port 1 View  10/18/2014   CLINICAL DATA:  Shortness of breath, pneumonia  EXAM: PORTABLE CHEST - 1 VIEW  COMPARISON:  Portable chest x-ray of October 18, 2014  FINDINGS: The lungs are adequately inflated. There is subsegmental atelectasis at the left lung base. The pulmonary interstitial markings are less conspicuous today. There are post CABG changes. The cardiac silhouette is top-normal in size. The pulmonary vascularity is not engorged. The intra-aortic balloon pump radiodense tip lies in the proximal descending thoracic aorta. The Swan-Ganz catheter tip projects in the right main pulmonary artery and is stable. The observed bony thorax is unremarkable.  IMPRESSION: 1. There has been mild interval improvement in the appearance of the pulmonary interstitium consistent with improving CHF. There is persistent subsegmental atelectasis at the left lung base. 2. The intra-aortic balloon pump tip is in appropriate radiographic position.   Electronically Signed   By: David  [place]   On: 10/18/2014 07:30   Dg Chest Port 1 View  10/17/2014   CLINICAL DATA:  Placement of the intra-aortic balloon pump a cyst.  EXAM: PORTABLE CHEST - 1 VIEW  COMPARISON:  10/15/2014   FINDINGS: Postoperative changes in the mediastinum. Interval placement of central venous catheter via in superior approach with tip over the left pulmonary outflow tract. No pneumothorax. Cardiac enlargement with pulmonary vascular congestion and perihilar infiltrates suggesting edema. Small bilateral pleural effusions with basilar atelectasis. Radiopaque EIA BP tip localized over the upper descending thoracic aorta.  IMPRESSION: Appliances appear to be in satisfactory location. Cardiac enlargement with pulmonary vascular congestion, perihilar edema, and small bilateral pleural effusions.   Electronically Signed   By: Burman Nieves M.D.   On: 10/17/2014 00:09   Dg Chest Port 1 View  10/15/2014   CLINICAL DATA:  Hypoxia  EXAM: PORTABLE CHEST - 1 VIEW  COMPARISON:  Yesterday  FINDINGS: Vascular congestion and diffuse edema have developed. Upper normal heart size. No pneumothorax. Small pleural effusions are now present.  IMPRESSION: New edema and small pleural effusions.   Electronically Signed   By: Maryclare Bean M.D.   On: 10/15/2014 09:06   Dg Chest Port 1 View  10/14/2014   CLINICAL DATA:  Chest pain.  EXAM: PORTABLE CHEST - 1 VIEW  COMPARISON:  Chest x-ray 05/14/2013  FINDINGS: Prior CABG. Cardiomegaly. Pulmonary vascularity is normal. Mild infiltrate medial right lung base. No pleural effusion. Stable left pleural thickening noted consistent scarring. Degenerative changes thoracic spine both shoulders.  IMPRESSION: 1. Mild infiltrate medial right lung base. 2. Cardiomegaly.  Prior CABG.  No pulmonary venous congestion.   Electronically Signed   By: Maisie Fus  Register   On: 10/14/2014 10:10    Microbiology: Recent Results (from the past 240 hour(s))  Culture, blood (routine x 2) Call MD if unable to obtain prior to antibiotics being given     Status:  None   Collection Time: 10/14/14  1:25 PM  Result Value Ref Range Status   Specimen Description BLOOD RIGHT HAND  Final   Special Requests BOTTLES DRAWN  AEROBIC AND ANAEROBIC 10CC  Final   Culture  Setup Time   Final    10/15/2014 02:25 Performed at [facility]    Culture   Final    NO GROWTH 5 DAYS Performed at [facility]    Report Status 10/22/2014 FINAL  Final  Culture, blood (routine x 2) Call MD if unable to obtain prior to antibiotics being given     Status: None   Collection Time: 10/14/14  1:29 PM  Result Value Ref Range Status   Specimen Description BLOOD LEFT HAND  Final   Special Requests BOTTLES DRAWN AEROBIC AND ANAEROBIC 10CC  Final   Culture  Setup Time   Final    10/15/2014 02:25 Performed at [facility]    Culture   Final    NO GROWTH 5 DAYS Performed at [facility]    Report Status 10/22/2014 FINAL  Final  MRSA PCR Screening     Status: None   Collection Time: 10/14/14  6:37 PM  Result Value Ref Range Status   MRSA by PCR NEGATIVE NEGATIVE Final    Comment:        The GeneXpert MRSA Assay (FDA approved for NASAL specimens only), is one component of a comprehensive MRSA colonization surveillance program. It is not intended to diagnose MRSA infection nor to guide or monitor treatment for MRSA infections.      Labs: Basic Metabolic Panel:  Recent Labs Lab 10/17/14 0345  10/19/14 0320 10/20/14 0451 10/21/14 0430 10/22/14 0240 10/23/14 0520  NA 136*  < > 135* 136* 138 139 140  K 3.6*  < > 4.0 4.2 4.2 4.7 4.8  CL 95*  < > 93* 94* 97 95* 98  CO2 29  < > 29 31 31  32 31  GLUCOSE 219*  < > 201* 123* 100* 119* 96  BUN 30*  < > 31* 30* 30* 30* 26*  CREATININE 1.51*  < > 1.57* 1.49* 1.44* 1.56* 1.41*  CALCIUM 8.5  < > 8.6 9.2 9.0 9.3 9.1  MG 1.9  --   --   --   --   --   --   < > = values in this interval not displayed. Liver Function Tests: No results for input(s): AST, ALT, ALKPHOS, BILITOT, PROT, ALBUMIN in the last 168 hours. No results for input(s): LIPASE, AMYLASE in the last 168 hours. No results for input(s): AMMONIA in the last 168  hours. CBC:  Recent Labs Lab 10/19/14 0320 10/20/14 0451 10/21/14 0430 10/22/14 0240 10/23/14 0520  WBC 9.7 9.7 10.6* 14.8* 13.4*  HGB 13.0 13.7 13.6 14.0 13.6  HCT 39.3 40.8 41.9 41.9 42.7  MCV 94.9 95.3 96.1 96.1 99.3  PLT 156 177 199 224 239   Cardiac Enzymes: No results for input(s): CKTOTAL, CKMB, CKMBINDEX, TROPONINI in the last 168 hours. BNP: BNP (last 3 results)  Recent Labs  10/14/14 0921 10/15/14 0237 10/16/14 0053  PROBNP 2372.0* 9181.0* 15184.0*   CBG:  Recent Labs Lab 10/22/14 1147 10/22/14 1640 10/22/14 2133 10/23/14 0708 10/23/14 1131  GLUCAP 258* 139* 158* 109* 179*       Signed:  ELLIS,ALLISON L. ANP [facility] 10/23/2014, 2:00 PM Examined patient and discussed assessment and plan with ANP Revonda Standard and agree with above. Discussed plan of care with  wife and answered all questions Patient with multiple complex medical problems >35 minutes spent in direct patient care

## 2014-10-29 ENCOUNTER — Ambulatory Visit (HOSPITAL_COMMUNITY)
Admit: 2014-10-29 | Discharge: 2014-10-29 | Disposition: A | Discharge status: Home / Self Care | Payer: Medicare Other | Source: Ambulatory Visit | Attending: Internal Medicine | Admitting: Internal Medicine

## 2014-10-29 ENCOUNTER — Encounter (HOSPITAL_COMMUNITY): Payer: Self-pay

## 2014-10-29 ENCOUNTER — Telehealth: Payer: Self-pay | Admitting: Pharmacist Clinician (PhC)/ Clinical Pharmacy Specialist

## 2014-10-29 VITALS — BP 111/74 | HR 95 | Resp 18 | Wt 213.1 lb

## 2014-10-29 DIAGNOSIS — Z951 Presence of aortocoronary bypass graft: Secondary | ICD-10-CM | POA: Insufficient documentation

## 2014-10-29 DIAGNOSIS — I5022 Chronic systolic (congestive) heart failure: Secondary | ICD-10-CM | POA: Insufficient documentation

## 2014-10-29 DIAGNOSIS — I4821 Permanent atrial fibrillation: Secondary | ICD-10-CM

## 2014-10-29 DIAGNOSIS — I482 Chronic atrial fibrillation: Secondary | ICD-10-CM

## 2014-10-29 DIAGNOSIS — I4891 Unspecified atrial fibrillation: Secondary | ICD-10-CM | POA: Diagnosis not present

## 2014-10-29 DIAGNOSIS — I2510 Atherosclerotic heart disease of native coronary artery without angina pectoris: Secondary | ICD-10-CM | POA: Insufficient documentation

## 2014-10-29 DIAGNOSIS — I25810 Atherosclerosis of coronary artery bypass graft(s) without angina pectoris: Secondary | ICD-10-CM

## 2014-10-29 LAB — BASIC METABOLIC PANEL
Anion gap: 16 — ABNORMAL HIGH (ref 5–15)
BUN: 37 mg/dL — AB (ref 6–23)
CHLORIDE: 92 meq/L — AB (ref 96–112)
CO2: 20 mEq/L (ref 19–32)
Calcium: 10.3 mg/dL (ref 8.4–10.5)
Creatinine, Ser: 1.98 mg/dL — ABNORMAL HIGH (ref 0.50–1.35)
GFR calc Af Amer: 36 mL/min — ABNORMAL LOW (ref >90)
GFR, EST NON AFRICAN AMERICAN: 31 mL/min — AB (ref >90)
Glucose, Bld: 155 mg/dL — ABNORMAL HIGH (ref 70–99)
POTASSIUM: 5.3 meq/L (ref 3.7–5.3)
SODIUM: 128 meq/L — AB (ref 137–147)

## 2014-10-29 LAB — PRO B NATRIURETIC PEPTIDE: PRO B NATRI PEPTIDE: 5069 pg/mL — AB (ref 0–450)

## 2014-10-29 NOTE — Progress Notes (Signed)
Patient ID: JAVONTA WIGMORE, male   DOB: 11/26/1941, 73 y.o.   MRN: [id number]  PCP: Dr. Thayer Headings Primary Cardiologist: Dr. [name]  HPI: Mr. Spanel is a 73 yo male with a history of HTN, DM2, HLD, CAD s/p CABG 1998, ICM, chronic CP, chronic atrial fibrillation, LBBB, COPD on chronic steroids and severe spinal stenosis.    Post Hospital Follow up: Reports heart is doing well but having back and leg pain. Says that his breathing is ok sometimes but gets labored breathing sometimes. O2 at home running 90-100. Weight at home 209-211 lbs. Denies PND or CP. +chronic orthopnea (sleeps in recliner d/t back pain and breathing). Walking a few times around the table at home and reports getting SOB. Very unstable and is getting weaker. Reports [facility] RN came to house but they are not sure if he is going to have PT because insurance will not cover. Following a low salt diet and drinking less than 2L a day. HR at home 50-90s  Studies: Echo (10/15/14): EF 25%, mod calcified annulus mitral valve, mod MR, RV sys fx mildly reduced LHC (10/16/14): patent LIMA-LAD and SVG to OM, TO SVG-PDA with TO PDA and 60% proximal RCA, some left =>PDA collaterals;  RHC (10/16/14): RA 20, RV 69/11, PA 69/36 (35), PCWP 35, PA sat: 46% on 100% NRB, Fick CO/CI: 3.15/1.43, Thermo CO/CI: 3.61/1.63   ROS: All systems negative except as listed in HPI, PMH and Problem List.  SH:  History   Social History  . Marital Status: Married    Spouse Name: N/A    Number of Children: N/A  . Years of Education: N/A   Occupational History  . Not on file.   Social History Main Topics  . Smoking status: Never Smoker   . Smokeless tobacco: Never Used  . Alcohol Use: No  . Drug Use: No  . Sexual Activity: Not on file   Other Topics Concern  . Not on file   Social History Narrative    FH:  Family History  Problem Relation Age of Onset  . Heart disease Mother   . Heart disease Father   . Heart disease Brother   . Heart disease  Maternal Aunt   . Cancer      Past Medical History  Diagnosis Date  . Pleuritis     h/o------ with negative vats biopsy  . Acute myocardial infarction, unspecified site, episode of care unspecified   . Hypertension   . Other and unspecified hyperlipidemia   . Coronary artery disease   . Chronic ischemic heart disease, unspecified   . Diabetes mellitus   . 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 Chronic anticoagulation, on coumadin 01/12/2013  . Cardiomyopathy, ischemic EF 25% 01/12/2013    now improved to 40-45% 01/2013  . Hypothyroidism   . GERD (gastroesophageal reflux disease)   . Arthritis     all over  . Atrial fibrillation 03/04/2011    Persistant;   . Digitalis toxicity     Current Outpatient Prescriptions  Medication Sig Dispense Refill  . acetaminophen (TYLENOL) 650 MG CR tablet Take 650 mg by mouth every 8 (eight) hours as needed. For pain    . calcium carbonate (OS-CAL) 600 MG TABS Take 600 mg by mouth daily.    . fluticasone (FLONASE) 50 MCG/ACT nasal spray Place 2 sprays into both nostrils daily.     . hydrALAZINE (APRESOLINE) 25 MG tablet Take 1 tablet (25 mg total) by  mouth every 8 (eight) hours. 90 tablet 0  . HYDROcodone-acetaminophen (NORCO/VICODIN) 5-325 MG per tablet Take 1-2 tablets by mouth every 4 (four) hours as needed for moderate pain. 30 tablet 0  . Insulin Glargine (LANTUS SOLOSTAR) 100 UNIT/ML Solostar Pen Inject 37 Units into the skin daily. 15 mL 11  . isosorbide mononitrate (IMDUR) 60 MG 24 hr tablet Take 1 tablet (60 mg total) by mouth daily. 30 tablet 0  . levothyroxine (SYNTHROID, LEVOTHROID) 150 MCG tablet Take 175 mcg by mouth daily.     . metoprolol succinate (TOPROL-XL) 12.5 mg TB24 24 hr tablet Take 1.5 tablets (37.5 mg total) by mouth 2 (two) times daily. 180 tablet 0  . nitroGLYCERIN (NITROSTAT) 0.4 MG SL tablet Place 1 tablet (0.4 mg total) under the tongue every 5 (five) minutes as needed for chest  pain. 100 tablet 12  . nystatin cream (MYCOSTATIN) Apply as needed    . Omega-3 Fatty Acids (FISH OIL) 1000 MG CAPS Take 3,000 mg by mouth every morning.    [name] omeprazole (PRILOSEC) 20 MG capsule Take 20 mg by mouth daily.    . predniSONE (DELTASONE) 10 MG tablet Take 10 mg by mouth daily with breakfast.    . rosuvastatin (CRESTOR) 20 MG tablet Take 1 tablet (20 mg total) by mouth daily. 30 tablet 0  . saxagliptin HCl (ONGLYZA) 5 MG TABS tablet Take 5 mg by mouth daily.    [name] spironolactone (ALDACTONE) 25 MG tablet Take 1 tablet (25 mg total) by mouth daily. 30 tablet 0  . torsemide (DEMADEX) 20 MG tablet Take 2 tablets (40 mg total) by mouth daily. 60 tablet 0  . triamcinolone cream (KENALOG) 0.1 % Apply 1 application topically daily.     [name] warfarin (COUMADIN) 5 MG tablet Take 1-2 tablets by mouth daily as directed (Patient taking differently: Take 7.5-10 mg by mouth daily at 6 PM. 7.5 mg daily except 10mg  Monday and Friday) 135 tablet 2  . aspirin EC 81 MG tablet Take 81 mg by mouth daily.     No current facility-administered medications for this encounter.    Filed Vitals:   10/29/14 0825  BP: 111/74  Pulse: 95  Resp: 18  Weight: 213 lb 2 oz (96.673 kg)  SpO2: 98%    PHYSICAL EXAM:  General: Fatigued appearing. No resp difficulty, in wheelchair, wife and son present HEENT: normal Neck: supple. JVP 7. Carotids 2+ bilaterally; no bruits. No lymphadenopathy or thryomegaly appreciated. Cor: PMI normal. Heart irregular S1/S2, no S3/S4, 1/6 HSM at apex.  Lungs: diminished in bases Abdomen: soft, nontender, nondistended. No hepatosplenomegaly. No bruits or masses. Good bowel sounds. Extremities: no cyanosis, clubbing, rash, edema Neuro: alert & orientedx3, cranial nerves grossly intact. Moves all 4 extremities w/o difficulty. Affect pleasant.   ASSESSMENT & PLAN:  1) Chronic systolic heart failure: EF 25% (10/2014), ischemic cardiomyopathy s/p CABG.  Patient recently admitted to the  hospital for A/C systolic CHF with cardiogenic shock in the setting of NSTEMI. He had IABP placed and was diuresed with IV lasix and milrinone. He was weaned off milrinone.  Discharge weight 214 lbs.  NYHA III symptoms but does not appear volume overloaded on exam. Concern for increased SOB will try to increase torsemide to 40 mg q am and 20 mg q pm for 3 days. Check BMET and pro-BNP. - Will continue Toprol 37.5 mg BID, spironolactone 25 mg daily, hydralazine 25 mg TID and Imdur 60 mg daily.  - No digoxin due to history  of digoxin toxicity.  - Will need repeat Echo in 3 months to reassess EF. If remains less than 35% will need referral for consideration of CRT-D. He has LBBB last QRS 134 msec. Would prefer [employer] for HF diagnostics.  - Reinforced the need and importance of daily weights, a low sodium diet, and fluid restriction (less than 2 L a day). Instructed to call the HF clinic if weight increases more than 3 lbs overnight or 5 lbs in a week.  2) CAD s/p CABG: No chest pain. In hospital, he had NSTEMI and angiography showed that culprit was likely occlusion of SVG-PDA though it may have been subacute given some collaterals. No good target for intervention. - Continue ASA and statin.  3) Elevated left hemidiaphragm:  This may contribute to chronic dyspnea. Patient and family think that breathing is getting worse, however he does not appear to have much fluid on board. He does not have a significant history of smoking so COPD unlikely.  I think it is chronic and related to deconditioning. Will see if we can get PT involved. As above will try to increase diuretics to 40 mg q am and 20 mg q pm for 3 days and see how he does.  4) Atrial fibrillation: Chronic. HR controlled will continue Toprol and ASA. 5) Immobility: Continue to try and be as active as possible and as above will try to get PT involved.  F/U 2 weeks Ulla Potash B 9:10 AM   Patient seen with NP, agree with the above note.  Recent  prolonged hospitalization with cardiogenic shock requiring IABP and milrinone. Possible subacute occlusion of SVG-PDA prior to hospitalization.  He continues to have significant exertional dyspnea though on exam, but volume status does not appear significantly elevated.  We initially planned to try to increase torsemide empirically to see if this helped but BMET returned with increased creatinine, so as he does not really look like he has volume on exam, I will not increase torsemide. I suspect some of the dyspnea is baseline related to elevated right hemidiaphragm and deconditioning.  Will need to involve PT.  If dyspnea continues, may need right heart cath.  I tried to avoid home milrinone given chronic atrial fibrillation and risk of raising rate.  Long-term, his poor mobility makes him a less than ideal LVAD candidate.   Marca Ancona 10/29/2014

## 2014-10-29 NOTE — Telephone Encounter (Signed)
-----   Message from Marella Bile, RN sent at 10/25/2014 10:06 AM EST -----   ----- Message -----    From: Runell Gess, MD    Sent: 10/24/2014   5:25 PM      To: Marella Bile, RN  He'll be bridging for his procedure since he is chronic A. Fib. He can hold his aspirin as well and restart both medications postop when the ophthalmologist feels comfortable   JJB ----- Message -----    From: Marella Bile, RN    Sent: 10/24/2014   5:22 PM      To: Runell Gess, MD  Please advise  ----- Message -----    From: Phillips Hay, RPH-CPP    Sent: 10/17/2014   7:56 AM      To: Marella Bile, RN, [place], RN, #  [name] will be having retinal surgery on December 3, with Dr. Allyne Gee of [facility].  They would like him to hold warfarin x 7 days prior to procedure.  They would also prefer that he hold his aspirin for 7 days as well, but will continue if aspirin not held.    Do you want to hold and bridge or just hold?  Belenda Cruise

## 2014-10-29 NOTE — Telephone Encounter (Signed)
Pt hospitalized prior to planned eye surgery.  When rescheduled will arrange to hold warfarin and aspirin, and coordinate lovenox bridge.

## 2014-10-29 NOTE — Patient Instructions (Addendum)
Doing well.  Continue to try and be as active as possible.  Increase your diuretics to 40 mg in the morning and 20 mg in the evening for 3 days then go back to 40 mg daily. Call us back to let us know if feeling better on higher dose of diuretics.   Call any issues.  Follow up in 2 weeks.  Will order overnight oximetry  Do the following things EVERYDAY: 1) Weigh yourself in the morning before breakfast. Write it down and keep it in a log. 2) Take your medicines as prescribed 3) Eat low salt foods-Limit salt (sodium) to 2000 mg per day.  4) Stay as active as you can everyday 5) Limit all fluids for the day to less than 2 liters 6)

## 2014-10-31 ENCOUNTER — Ambulatory Visit: Payer: Medicare Other | Admitting: Pharmacist Clinician (PhC)/ Clinical Pharmacy Specialist

## 2014-11-01 ENCOUNTER — Ambulatory Visit (INDEPENDENT_AMBULATORY_CARE_PROVIDER_SITE_OTHER): Payer: Medicare Other | Admitting: Pharmacist Clinician (PhC)/ Clinical Pharmacy Specialist

## 2014-11-01 DIAGNOSIS — Z7901 Long term (current) use of anticoagulants: Secondary | ICD-10-CM

## 2014-11-01 DIAGNOSIS — I4821 Permanent atrial fibrillation: Secondary | ICD-10-CM

## 2014-11-01 DIAGNOSIS — I482 Chronic atrial fibrillation: Secondary | ICD-10-CM

## 2014-11-01 LAB — POCT INR: INR: 3

## 2014-11-05 IMAGING — US US ABDOMEN LIMITED
1 series · 14 of 25 positions shown · non-contrast
Comparison: Ultrasound of the abdomen of 04/14/2011

CLINICAL DATA: Elevated liver function tests, diabetes

LIMITED ABDOMINAL ULTRASOUND - RIGHT UPPER QUADRANT

[Series 1: us abdomen limited · 0.30mm/px · 14 of 43 slices shown]
[im 1/43]
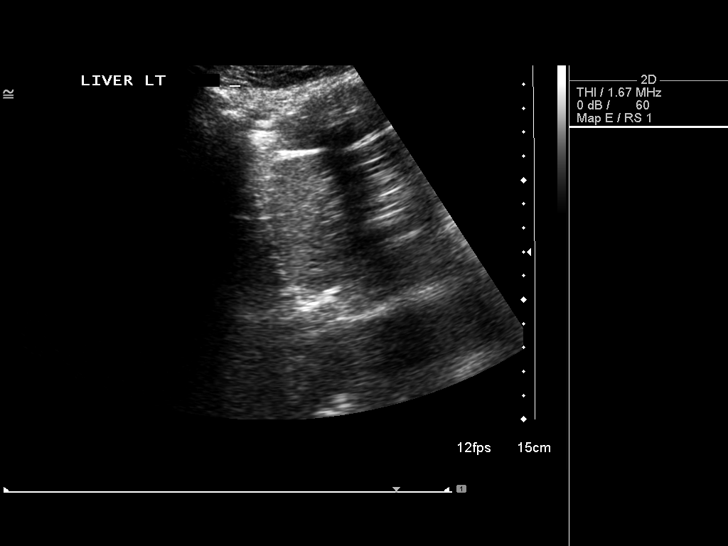
[im 4/43]
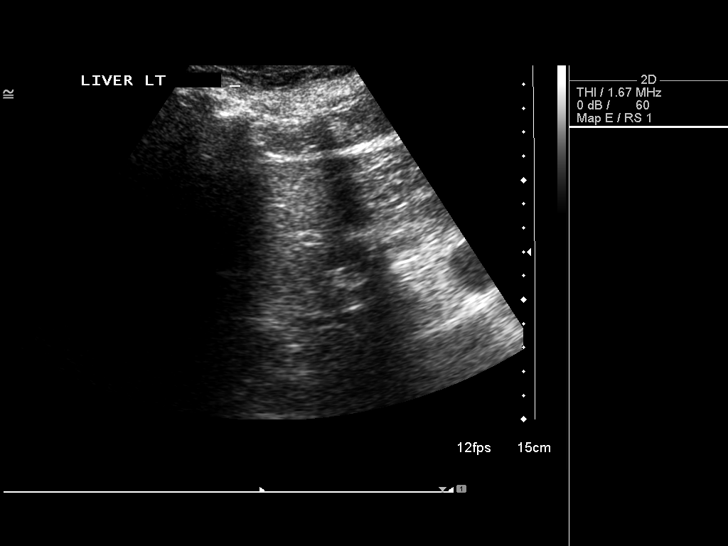
[im 8/43]
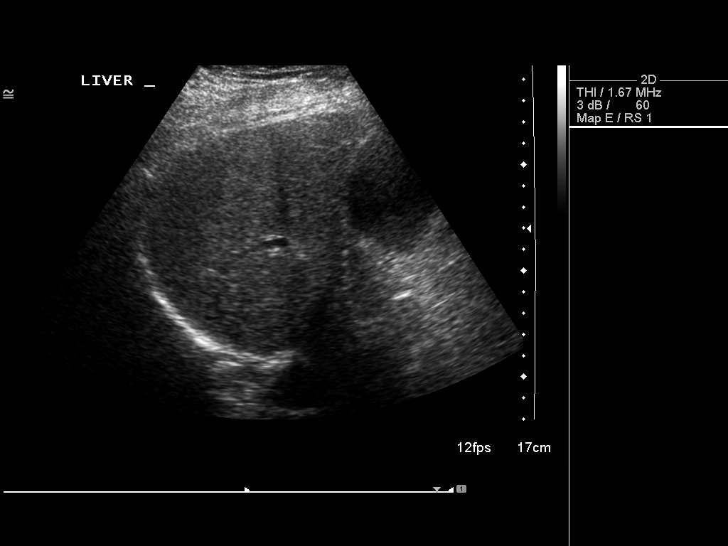
[im 11/43]
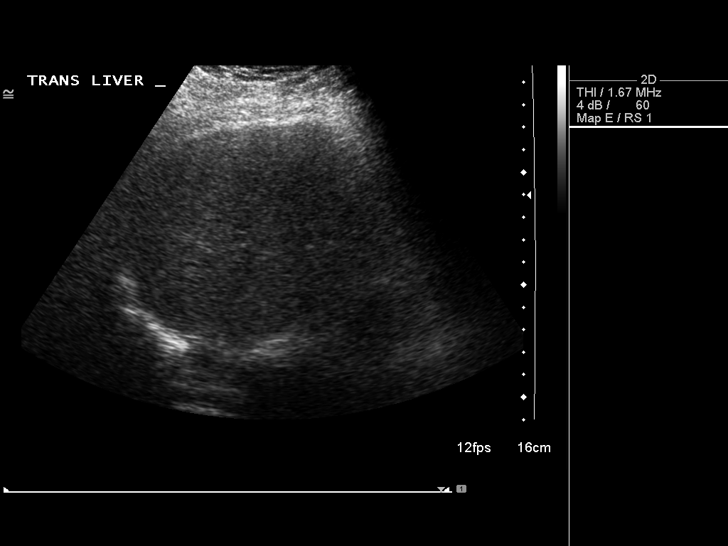
[im 15/43]
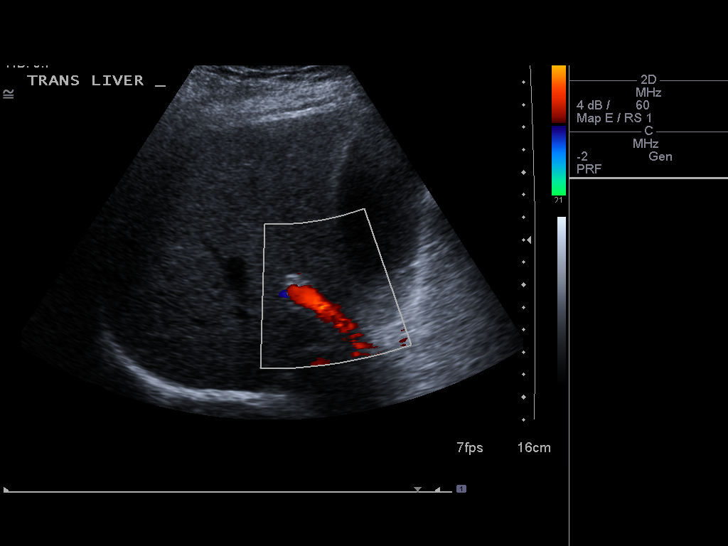
[im 16/43]
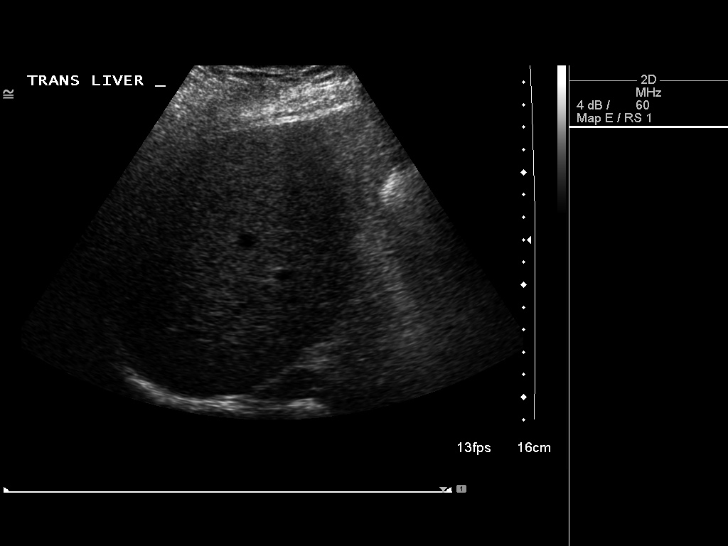
[im 20/43]
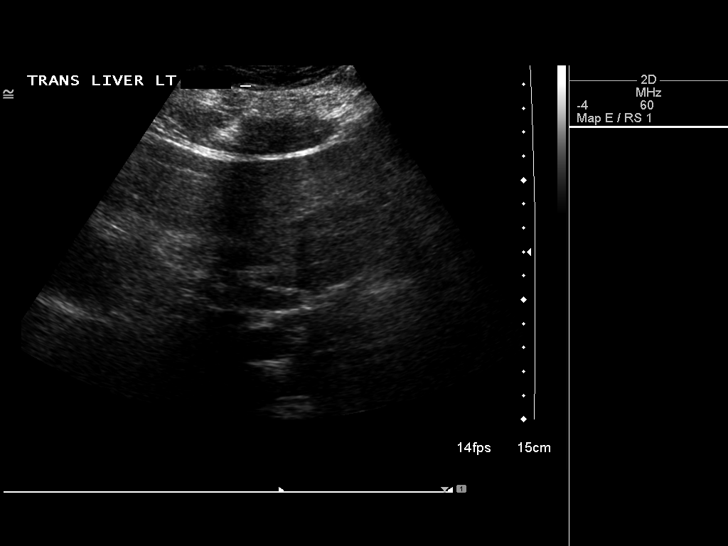
[im 23/43]
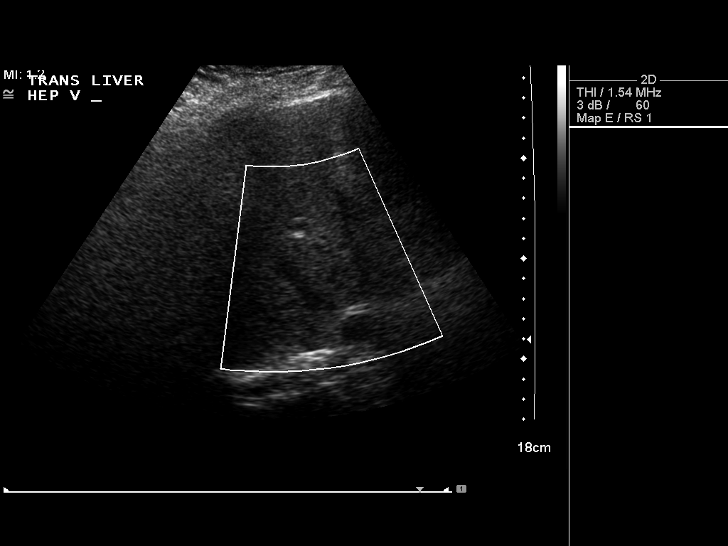
[im 27/43]
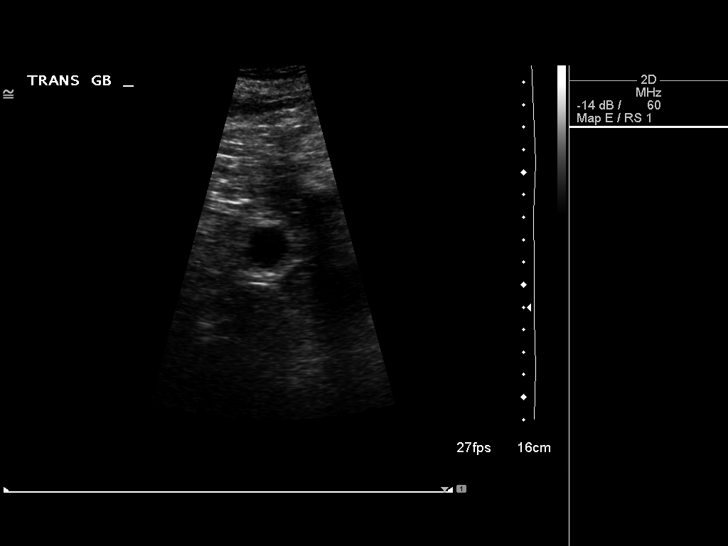
[im 29/43]
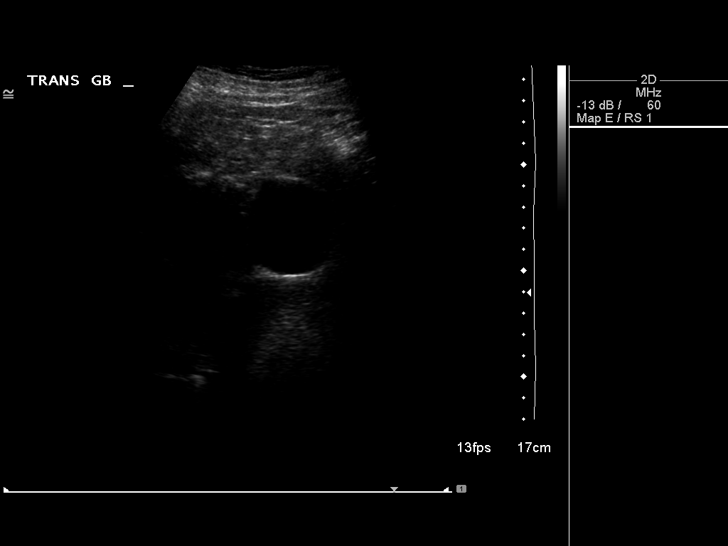
[im 32/43]
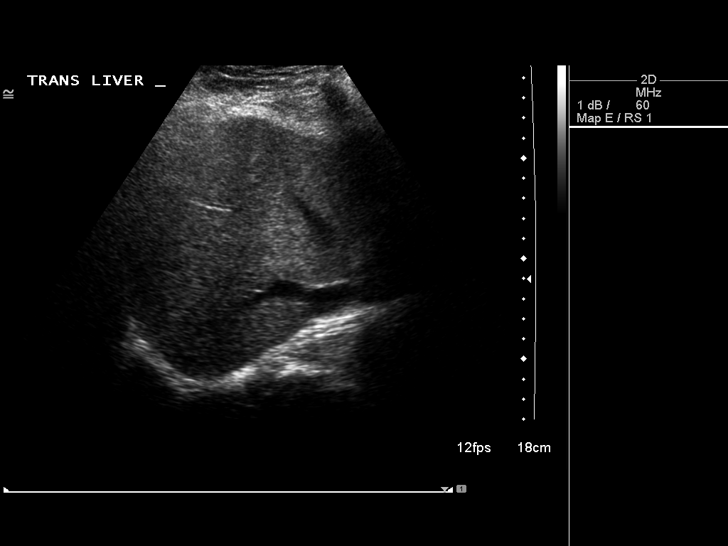
[im 36/43]
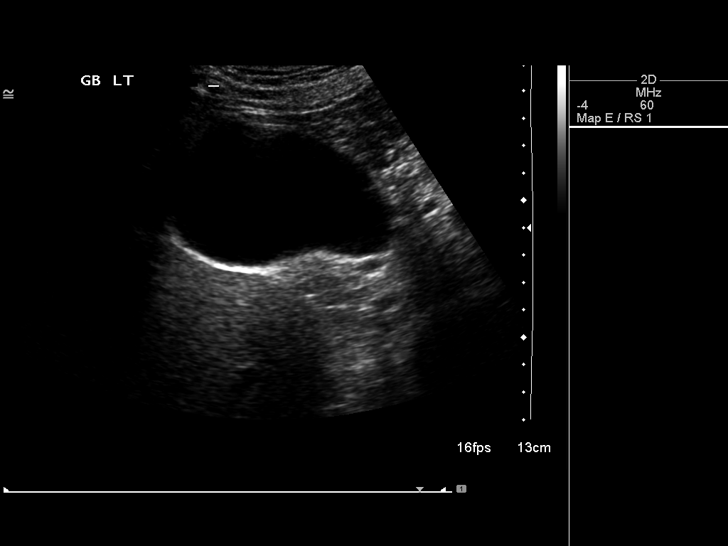
[im 39/43]
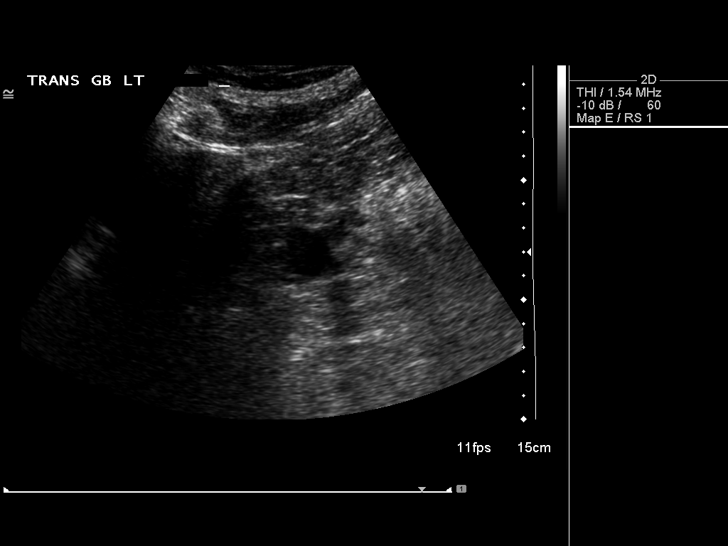
[im 43/43]
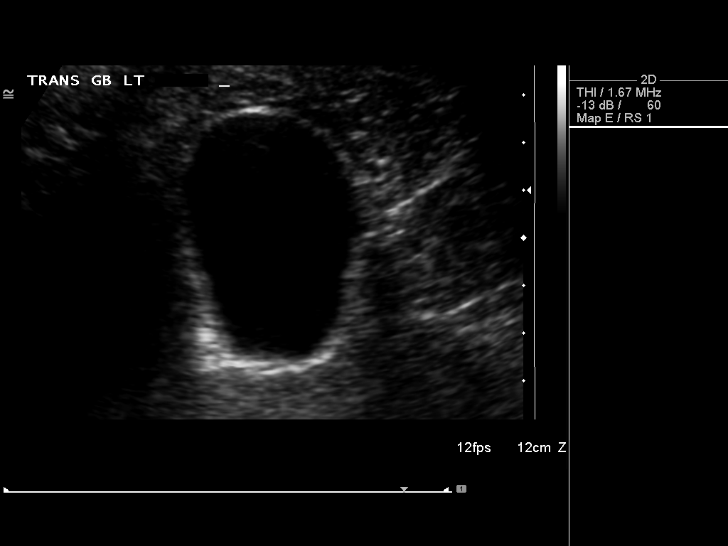

[14 of 25 positions shown; findings below may reference images not displayed]

FINDINGS: Gallbladder:  The gallbladder is visualized and no gallstones are
seen.  The gallbladder wall is not thickened, and there is no pain
over the gallbladder with compression.

Common bile duct:  The common bile duct is normal measuring 4.2 mm
in diameter.

Liver:  The liver is slightly echogenic suggesting mild fatty
infiltration.  No focal abnormality is seen.
IMPRESSION: 1.  No gallstones.  No pain over gallbladder.
2.  Slightly increased echogenicity of the liver parenchyma
consistent with mild fatty infiltration.

## 2014-11-07 ENCOUNTER — Ambulatory Visit (INDEPENDENT_AMBULATORY_CARE_PROVIDER_SITE_OTHER): Payer: Medicare Other | Admitting: Pharmacist Clinician (PhC)/ Clinical Pharmacy Specialist

## 2014-11-07 DIAGNOSIS — Z7901 Long term (current) use of anticoagulants: Secondary | ICD-10-CM

## 2014-11-07 DIAGNOSIS — I4821 Permanent atrial fibrillation: Secondary | ICD-10-CM

## 2014-11-07 DIAGNOSIS — I482 Chronic atrial fibrillation: Secondary | ICD-10-CM

## 2014-11-07 LAB — POCT INR: INR: 3

## 2014-11-12 ENCOUNTER — Ambulatory Visit (INDEPENDENT_AMBULATORY_CARE_PROVIDER_SITE_OTHER): Payer: Medicare Other | Admitting: Pharmacist Clinician (PhC)/ Clinical Pharmacy Specialist

## 2014-11-12 DIAGNOSIS — I4821 Permanent atrial fibrillation: Secondary | ICD-10-CM

## 2014-11-12 DIAGNOSIS — I482 Chronic atrial fibrillation: Secondary | ICD-10-CM

## 2014-11-12 DIAGNOSIS — Z7901 Long term (current) use of anticoagulants: Secondary | ICD-10-CM

## 2014-11-12 LAB — POCT INR: INR: 2.7

## 2014-11-13 ENCOUNTER — Ambulatory Visit (HOSPITAL_COMMUNITY)
Admission: RE | Admit: 2014-11-13 | Discharge: 2014-11-13 | Disposition: A | Discharge status: Home / Self Care | Payer: Medicare Other | Source: Ambulatory Visit | Attending: Internal Medicine | Admitting: Internal Medicine

## 2014-11-13 ENCOUNTER — Encounter (HOSPITAL_COMMUNITY): Payer: Self-pay

## 2014-11-13 VITALS — BP 100/72 | HR 71 | Wt 208.8 lb

## 2014-11-13 DIAGNOSIS — M25552 Pain in left hip: Secondary | ICD-10-CM | POA: Insufficient documentation

## 2014-11-13 DIAGNOSIS — I255 Ischemic cardiomyopathy: Secondary | ICD-10-CM | POA: Insufficient documentation

## 2014-11-13 DIAGNOSIS — M069 Rheumatoid arthritis, unspecified: Secondary | ICD-10-CM | POA: Diagnosis not present

## 2014-11-13 DIAGNOSIS — I447 Left bundle-branch block, unspecified: Secondary | ICD-10-CM | POA: Diagnosis not present

## 2014-11-13 DIAGNOSIS — Z79899 Other long term (current) drug therapy: Secondary | ICD-10-CM | POA: Insufficient documentation

## 2014-11-13 DIAGNOSIS — I10 Essential (primary) hypertension: Secondary | ICD-10-CM | POA: Diagnosis not present

## 2014-11-13 DIAGNOSIS — I5023 Acute on chronic systolic (congestive) heart failure: Secondary | ICD-10-CM | POA: Insufficient documentation

## 2014-11-13 DIAGNOSIS — E119 Type 2 diabetes mellitus without complications: Secondary | ICD-10-CM | POA: Diagnosis not present

## 2014-11-13 DIAGNOSIS — Z794 Long term (current) use of insulin: Secondary | ICD-10-CM | POA: Insufficient documentation

## 2014-11-13 DIAGNOSIS — J986 Disorders of diaphragm: Secondary | ICD-10-CM

## 2014-11-13 DIAGNOSIS — I482 Chronic atrial fibrillation: Secondary | ICD-10-CM | POA: Diagnosis not present

## 2014-11-13 DIAGNOSIS — E785 Hyperlipidemia, unspecified: Secondary | ICD-10-CM | POA: Diagnosis not present

## 2014-11-13 DIAGNOSIS — Z7982 Long term (current) use of aspirin: Secondary | ICD-10-CM | POA: Diagnosis not present

## 2014-11-13 DIAGNOSIS — I25701 Atherosclerosis of coronary artery bypass graft(s), unspecified, with angina pectoris with documented spasm: Secondary | ICD-10-CM

## 2014-11-13 DIAGNOSIS — Z7901 Long term (current) use of anticoagulants: Secondary | ICD-10-CM | POA: Insufficient documentation

## 2014-11-13 DIAGNOSIS — I252 Old myocardial infarction: Secondary | ICD-10-CM | POA: Insufficient documentation

## 2014-11-13 DIAGNOSIS — M549 Dorsalgia, unspecified: Secondary | ICD-10-CM | POA: Diagnosis not present

## 2014-11-13 DIAGNOSIS — I5022 Chronic systolic (congestive) heart failure: Secondary | ICD-10-CM

## 2014-11-13 DIAGNOSIS — I4891 Unspecified atrial fibrillation: Secondary | ICD-10-CM

## 2014-11-13 DIAGNOSIS — Z951 Presence of aortocoronary bypass graft: Secondary | ICD-10-CM | POA: Insufficient documentation

## 2014-11-13 LAB — CBC
HEMATOCRIT: 48.5 % (ref 39.0–52.0)
Hemoglobin: 16.1 g/dL (ref 13.0–17.0)
MCH: 32.7 pg (ref 26.0–34.0)
MCHC: 33.2 g/dL (ref 30.0–36.0)
MCV: 98.6 fL (ref 78.0–100.0)
PLATELETS: 143 10*3/uL — AB (ref 150–400)
RBC: 4.92 MIL/uL (ref 4.22–5.81)
RDW: 15.9 % — AB (ref 11.5–15.5)
WBC: 15.5 10*3/uL — AB (ref 4.0–10.5)

## 2014-11-13 LAB — BASIC METABOLIC PANEL
Anion gap: 24 — ABNORMAL HIGH (ref 5–15)
BUN: 43 mg/dL — AB (ref 6–23)
CALCIUM: 10 mg/dL (ref 8.4–10.5)
CO2: 15 mEq/L — ABNORMAL LOW (ref 19–32)
Chloride: 90 mEq/L — ABNORMAL LOW (ref 96–112)
Creatinine, Ser: 1.9 mg/dL — ABNORMAL HIGH (ref 0.50–1.35)
GFR, EST AFRICAN AMERICAN: 38 mL/min — AB (ref >90)
GFR, EST NON AFRICAN AMERICAN: 33 mL/min — AB (ref >90)
GLUCOSE: 191 mg/dL — AB (ref 70–99)
POTASSIUM: 5.7 meq/L — AB (ref 3.7–5.3)
Sodium: 129 mEq/L — ABNORMAL LOW (ref 137–147)

## 2014-11-13 LAB — PRO B NATRIURETIC PEPTIDE: Pro B Natriuretic peptide (BNP): 3446 pg/mL — ABNORMAL HIGH (ref 0–450)

## 2014-11-13 MED ORDER — TORSEMIDE 20 MG PO TABS: ORAL_TABLET | ORAL | Status: DC

## 2014-11-13 MED ORDER — SPIRONOLACTONE 25 MG PO TABS: 12.5000 mg | ORAL_TABLET | Freq: Every day | ORAL | Status: DC

## 2014-11-13 NOTE — Patient Instructions (Signed)
Increase Torsemide to 40mg  (2 tablets) one day, then 20 mg(1 tablet) the next day, and alternate continuously.  Decrease Spironolactone to 12.5mg  (1/2 tabelt) once daily.  [facility] will contact you to set up a night to do overnight oximetry test.  Follow up with Dr. for pulmonary care.  Follow up with our office in 1 month.  Will schedule echocardiogram in February.  Do the following things EVERYDAY: 1) Weigh yourself in the morning before breakfast. Write it down and keep it in a log. 2) Take your medicines as prescribed 3) Eat low salt foods-Limit salt (sodium) to 2000 mg per day.  4) Stay as active as you can everyday 5) Limit all fluids for the day to less than 2 liters

## 2014-11-14 NOTE — Progress Notes (Addendum)
Patient ID: [name], male   DOB: 1941/04/29, 73 y.o.   MRN: [id number]  PCP: Dr. Thayer Headings Primary Cardiologist: Dr. [name]  HPI: [name] is a 73 yo male with a history of HTN, DM2, HLD, CAD s/p CABG 1998, ischemic cardiomyopathy, chronic angina, chronic atrial fibrillation, LBBB, rheumatoid arthritis on chronic steroids, and severe spinal stenosis.  He has a chronic elevated left hemidiaphragm.  He has left hip pain with adductor muscle tears on prior MRI.   Symptoms seem similar compared to prior appointment.  He is still minimally active, but this has been an issue for years now and mainly related to spinal stenosis with back pain and hip pain.  He has been getting PT at home.  His walking is limited by hip and back pain.  He walks short distances in his house.  He is short of breath after walking about 20 feet (chronic).  He uses NTG rarely for chest pain.  Last chest pain was about a week ago, it was somewhat atypical but he took 2 NTG.  No clear exertional chest pain.    Labs (11/15): K 5.3, creatinine 1.98, BNP 15184 => 5069, hemoglobin 13.6  Studies: Echo (10/15/14): EF 25%, mod calcified annulus mitral valve, mod MR, RV sys fx mildly reduced LHC (10/16/14): patent LIMA-LAD and SVG to OM, TO SVG-PDA with TO PDA and 60% proximal RCA, some left =>PDA collaterals;  RHC (10/16/14): RA 20, RV 69/11, PA 69/36 (35), PCWP 35, PA sat: 46% on 100% NRB, Fick CO/CI: 3.15/1.43, Thermo CO/CI: 3.61/1.63 ECG (11/15): atrial fibrillation with LBBB, QRS 132 msec.   ROS: All systems negative except as listed in HPI, PMH and Problem List.  SH:  History   Social History  . Marital Status: Married    Spouse Name: N/A    Number of Children: N/A  . Years of Education: N/A   Occupational History  . Not on file.   Social History Main Topics  . Smoking status: Never Smoker   . Smokeless tobacco: Never Used  . Alcohol Use: No  . Drug Use: No  . Sexual Activity: Not on file   Other  Topics Concern  . Not on file   Social History Narrative    FH:  Family History  Problem Relation Age of Onset  . Heart disease Mother   . Heart disease Father   . Heart disease Brother   . Heart disease Maternal Aunt   . Cancer      Past Medical History  Diagnosis Date  . Pleuritis     h/o------ with negative vats biopsy  . Acute myocardial infarction, unspecified site, episode of care unspecified   . Hypertension   . Other and unspecified hyperlipidemia   . Coronary artery disease   . Chronic ischemic heart disease, unspecified   . Diabetes mellitus   . 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 Chronic anticoagulation, on coumadin 01/12/2013  . Cardiomyopathy, ischemic EF 25% 01/12/2013    now improved to 40-45% 01/2013  . Hypothyroidism   . GERD (gastroesophageal reflux disease)   . Arthritis     all over  . Atrial fibrillation 03/04/2011    Persistant;   . Digitalis toxicity     Current Outpatient Prescriptions  Medication Sig Dispense Refill  . acetaminophen (TYLENOL) 650 MG CR tablet Take 650 mg by mouth every 8 (eight) hours as needed. For pain    . aspirin EC 81 MG tablet  Take 81 mg by mouth daily.    . calcium carbonate (OS-CAL) 600 MG TABS Take 600 mg by mouth daily.    . fluticasone (FLONASE) 50 MCG/ACT nasal spray Place 2 sprays into both nostrils daily.     . hydrALAZINE (APRESOLINE) 25 MG tablet Take 1 tablet (25 mg total) by mouth every 8 (eight) hours. 90 tablet 0  . HYDROcodone-acetaminophen (NORCO/VICODIN) 5-325 MG per tablet Take 1-2 tablets by mouth every 4 (four) hours as needed for moderate pain. 30 tablet 0  . Insulin Glargine (LANTUS SOLOSTAR) 100 UNIT/ML Solostar Pen Inject 37 Units into the skin daily. 15 mL 11  . isosorbide mononitrate (IMDUR) 60 MG 24 hr tablet Take 1 tablet (60 mg total) by mouth daily. 30 tablet 0  . levothyroxine (SYNTHROID, LEVOTHROID) 150 MCG tablet Take 175 mcg by mouth daily.     .  metoprolol succinate (TOPROL-XL) 12.5 mg TB24 24 hr tablet Take 1.5 tablets (37.5 mg total) by mouth 2 (two) times daily. 180 tablet 0  . nitroGLYCERIN (NITROSTAT) 0.4 MG SL tablet Place 1 tablet (0.4 mg total) under the tongue every 5 (five) minutes as needed for chest pain. 100 tablet 12  . nystatin cream (MYCOSTATIN) Apply as needed    . Omega-3 Fatty Acids (FISH OIL) 1000 MG CAPS Take 3,000 mg by mouth every morning.    [name] omeprazole (PRILOSEC) 20 MG capsule Take 20 mg by mouth daily.    . predniSONE (DELTASONE) 10 MG tablet Take 10 mg by mouth daily with breakfast.    . rosuvastatin (CRESTOR) 20 MG tablet Take 1 tablet (20 mg total) by mouth daily. 30 tablet 0  . saxagliptin HCl (ONGLYZA) 5 MG TABS tablet Take 5 mg by mouth daily.    [name] spironolactone (ALDACTONE) 25 MG tablet Take 0.5 tablets (12.5 mg total) by mouth daily. 45 tablet 3  . torsemide (DEMADEX) 20 MG tablet Take 40mg  (2 tabs) one day, 20mg  (1 tab) next day, and alternate. 75 tablet 3  . triamcinolone cream (KENALOG) 0.1 % Apply 1 application topically daily.     warfarin (COUMADIN) 5 MG tablet Take 1-2 tablets by mouth daily as directed (Patient taking differently: Take 7.5-10 mg by mouth daily at 6 PM. 7.5 mg daily except 10mg  Monday and Friday) 135 tablet 2   No current facility-administered medications for this encounter.    Filed Vitals:   11/13/14 1430  BP: 100/72  Pulse: 71  Weight: 208 lb 12.8 oz (94.711 kg)  SpO2: 98%    PHYSICAL EXAM:  General: Fatigued appearing. No resp difficulty, in wheelchair, wife and son present HEENT: normal Neck: supple. JVP 7. Carotids 2+ bilaterally; no bruits. No lymphadenopathy or thryomegaly appreciated. Cor: PMI normal. Heart irregular S1/S2, no S3/S4, 1/6 HSM at apex.  Lungs: Decreased breath sounds at left base.  Abdomen: soft, nontender, nondistended. No hepatosplenomegaly. No bruits or masses. Good bowel sounds. Extremities: no cyanosis, clubbing, rash, edema Neuro:  alert & orientedx3, cranial nerves grossly intact. Moves all 4 extremities w/o difficulty. Affect pleasant.  ASSESSMENT & PLAN: 1) Chronic systolic heart failure: EF 25% (10/2014), ischemic cardiomyopathy s/p CABG.  Patient recently admitted to the hospital for A/C systolic CHF with cardiogenic shock in the setting of NSTEMI. He had IABP placed and was diuresed with IV lasix and milrinone. He was weaned off milrinone.  Weight is down 5 lbs since last appointment.  NYHA IIIb symptoms but does not appear volume overloaded on exam. Creatinine and K higher on  last labs.  - Will continue Toprol 37.5 mg BID, hydralazine 25 mg TID and Imdur 60 mg daily.  - Decrease spironolactone to 12.5 mg daily given elevated K.  - As he is not volume overloaded on exam and weight is down, I will decrease torsemide to 40 daily alternating with 20 daily given elevated creatinine.  - I will repeat BMET/BNP today.  - No digoxin due to history of digoxin toxicity.  - Will need repeat Echo in 2/16 to reassess EF. If remains less than 35% will need referral for consideration of CRT-D. He has LBBB last QRS 134 msec. - Reinforced the need and importance of daily weights, a low sodium diet, and fluid restriction (less than 2 L a day). Instructed to call the HF clinic if weight increases more than 3 lbs overnight or 5 lbs in a week.  2) CAD s/p CABG: Rare chest pain. Culprit for recent NSTEMI was likely occlusion of SVG-PDA.  There was no good target for intervention. - Continue ASA and statin.  3) Dyspnea:  This continues to be significant.  As above, I do not think that he is significantly volume overloaded.  He has been very limited for a long time now.  I suspect that elevated left hemidiaphragm plays a role, as does general deconditioning.  He did not smoke so doubt COPD.  There was consideration for interstitial lung disease from rheumatoid arthritis in the past, but CT did not show evidence for ILD.   - I will arrange for  overnight oximetry to see if he needs oxygen at night.   - I would like him to followup with pulmonary, will arrange appt with Dr Shelle Iron (has seen in the past).  4) Atrial fibrillation: Chronic. HR controlled, will continue Toprol and warfarin.   5) Immobility: Continue to try and be as active as possible.  Followup in 1 month.  Marca Ancona 11/14/2014

## 2014-11-15 ENCOUNTER — Other Ambulatory Visit (HOSPITAL_COMMUNITY): Payer: Self-pay

## 2014-11-15 ENCOUNTER — Other Ambulatory Visit (HOSPITAL_COMMUNITY): Payer: Self-pay | Admitting: Cardiology

## 2014-11-15 ENCOUNTER — Telehealth (HOSPITAL_COMMUNITY): Payer: Self-pay

## 2014-11-15 ENCOUNTER — Encounter: Payer: Self-pay | Admitting: Cardiology

## 2014-11-15 DIAGNOSIS — R0600 Dyspnea, unspecified: Secondary | ICD-10-CM

## 2014-11-15 NOTE — Telephone Encounter (Signed)
Attempted to call patient concerning high K level from labs on 11/13/2014, per [name] needs to stop spironolactone, reduce K  In diet, and recheck BMET next week.  Will attempt to recall later today.  Ave Filter

## 2014-11-15 NOTE — Telephone Encounter (Signed)
Wife returned my call, instructed wife to have patient stop spironolactone, reduce K in diet, and have BMET rechecked next week per Dr. [name] for K of 5.7.  Patient not symptomatic and "feels fine".  Patient has appointment with Dr. Mitzie Na next Monday at [facility], will see if they mind drawing this lab at that appointment and faxing results to our office, otherwise will have him come over to our clinic after that appointment.  Aware anda greeable.  Ave Filter

## 2014-11-16 ENCOUNTER — Other Ambulatory Visit: Payer: Self-pay | Admitting: *Deleted

## 2014-11-16 MED ORDER — ISOSORBIDE MONONITRATE ER 60 MG PO TB24: 60.0000 mg | ORAL_TABLET | Freq: Every day | ORAL | Status: DC

## 2014-11-16 NOTE — Telephone Encounter (Signed)
Refilled electronically 

## 2014-11-19 ENCOUNTER — Ambulatory Visit (INDEPENDENT_AMBULATORY_CARE_PROVIDER_SITE_OTHER): Payer: Medicare Other | Admitting: Pharmacist Clinician (PhC)/ Clinical Pharmacy Specialist

## 2014-11-19 DIAGNOSIS — I482 Chronic atrial fibrillation: Secondary | ICD-10-CM

## 2014-11-19 DIAGNOSIS — Z7901 Long term (current) use of anticoagulants: Secondary | ICD-10-CM

## 2014-11-19 DIAGNOSIS — I4821 Permanent atrial fibrillation: Secondary | ICD-10-CM

## 2014-11-19 LAB — POCT INR: INR: 2.6

## 2014-11-22 ENCOUNTER — Encounter (HOSPITAL_COMMUNITY): Payer: Self-pay | Admitting: Cardiology

## 2014-11-26 ENCOUNTER — Telehealth (HOSPITAL_COMMUNITY): Payer: Self-pay | Admitting: Cardiology

## 2014-11-26 ENCOUNTER — Ambulatory Visit (INDEPENDENT_AMBULATORY_CARE_PROVIDER_SITE_OTHER): Payer: Medicare Other | Admitting: Pharmacist Clinician (PhC)/ Clinical Pharmacy Specialist

## 2014-11-26 DIAGNOSIS — Z7901 Long term (current) use of anticoagulants: Secondary | ICD-10-CM

## 2014-11-26 DIAGNOSIS — I482 Chronic atrial fibrillation: Secondary | ICD-10-CM

## 2014-11-26 DIAGNOSIS — I4821 Permanent atrial fibrillation: Secondary | ICD-10-CM

## 2014-11-26 LAB — POCT INR: INR: 2

## 2014-11-26 NOTE — Telephone Encounter (Signed)
Inetta Fermo, RN called during [facility] visit to inform CHF clinic pt was c/o chest congestion today pts weight is stable, denies increased SOB, and pts vitals are stable Pt does admit to using NTG x 4 today, this is not uncommon for pt  Advised pt should report to ER for further evaluation of chest pain and can take plain coricidin or plain mucinex. As long as it does not have a decongestant

## 2014-11-27 ENCOUNTER — Encounter (HOSPITAL_COMMUNITY): Payer: Self-pay

## 2014-11-27 ENCOUNTER — Observation Stay (HOSPITAL_COMMUNITY)
Admission: EM | Admit: 2014-11-27 | Discharge: 2014-11-30 | Disposition: A | Discharge status: Home / Self Care | Payer: Medicare Other | Attending: Internal Medicine | Admitting: Internal Medicine

## 2014-11-27 ENCOUNTER — Emergency Department (HOSPITAL_COMMUNITY): Payer: Medicare Other

## 2014-11-27 DIAGNOSIS — Z7982 Long term (current) use of aspirin: Secondary | ICD-10-CM | POA: Insufficient documentation

## 2014-11-27 DIAGNOSIS — E119 Type 2 diabetes mellitus without complications: Secondary | ICD-10-CM | POA: Insufficient documentation

## 2014-11-27 DIAGNOSIS — N183 Chronic kidney disease, stage 3 unspecified: Secondary | ICD-10-CM | POA: Diagnosis present

## 2014-11-27 DIAGNOSIS — G8929 Other chronic pain: Secondary | ICD-10-CM | POA: Insufficient documentation

## 2014-11-27 DIAGNOSIS — J984 Other disorders of lung: Secondary | ICD-10-CM | POA: Diagnosis not present

## 2014-11-27 DIAGNOSIS — R0789 Other chest pain: Secondary | ICD-10-CM | POA: Diagnosis present

## 2014-11-27 DIAGNOSIS — R079 Chest pain, unspecified: Principal | ICD-10-CM | POA: Insufficient documentation

## 2014-11-27 DIAGNOSIS — I25719 Atherosclerosis of autologous vein coronary artery bypass graft(s) with unspecified angina pectoris: Secondary | ICD-10-CM | POA: Diagnosis present

## 2014-11-27 DIAGNOSIS — Z7951 Long term (current) use of inhaled steroids: Secondary | ICD-10-CM | POA: Insufficient documentation

## 2014-11-27 DIAGNOSIS — E875 Hyperkalemia: Secondary | ICD-10-CM | POA: Diagnosis not present

## 2014-11-27 DIAGNOSIS — I10 Essential (primary) hypertension: Secondary | ICD-10-CM | POA: Diagnosis present

## 2014-11-27 DIAGNOSIS — I4891 Unspecified atrial fibrillation: Secondary | ICD-10-CM | POA: Diagnosis not present

## 2014-11-27 DIAGNOSIS — Z79899 Other long term (current) drug therapy: Secondary | ICD-10-CM | POA: Insufficient documentation

## 2014-11-27 DIAGNOSIS — R05 Cough: Secondary | ICD-10-CM | POA: Insufficient documentation

## 2014-11-27 DIAGNOSIS — M1990 Unspecified osteoarthritis, unspecified site: Secondary | ICD-10-CM | POA: Insufficient documentation

## 2014-11-27 DIAGNOSIS — I4821 Permanent atrial fibrillation: Secondary | ICD-10-CM | POA: Diagnosis present

## 2014-11-27 DIAGNOSIS — M25559 Pain in unspecified hip: Secondary | ICD-10-CM | POA: Insufficient documentation

## 2014-11-27 DIAGNOSIS — J209 Acute bronchitis, unspecified: Secondary | ICD-10-CM

## 2014-11-27 DIAGNOSIS — Z794 Long term (current) use of insulin: Secondary | ICD-10-CM | POA: Insufficient documentation

## 2014-11-27 DIAGNOSIS — R059 Cough, unspecified: Secondary | ICD-10-CM | POA: Insufficient documentation

## 2014-11-27 DIAGNOSIS — I482 Chronic atrial fibrillation: Secondary | ICD-10-CM

## 2014-11-27 DIAGNOSIS — Z7952 Long term (current) use of systemic steroids: Secondary | ICD-10-CM | POA: Diagnosis not present

## 2014-11-27 DIAGNOSIS — M4800 Spinal stenosis, site unspecified: Secondary | ICD-10-CM | POA: Diagnosis present

## 2014-11-27 DIAGNOSIS — I340 Nonrheumatic mitral (valve) insufficiency: Secondary | ICD-10-CM | POA: Insufficient documentation

## 2014-11-27 DIAGNOSIS — E039 Hypothyroidism, unspecified: Secondary | ICD-10-CM | POA: Diagnosis not present

## 2014-11-27 DIAGNOSIS — Z7901 Long term (current) use of anticoagulants: Secondary | ICD-10-CM | POA: Insufficient documentation

## 2014-11-27 DIAGNOSIS — I5022 Chronic systolic (congestive) heart failure: Secondary | ICD-10-CM | POA: Diagnosis not present

## 2014-11-27 DIAGNOSIS — I252 Old myocardial infarction: Secondary | ICD-10-CM | POA: Insufficient documentation

## 2014-11-27 DIAGNOSIS — Z8701 Personal history of pneumonia (recurrent): Secondary | ICD-10-CM | POA: Diagnosis not present

## 2014-11-27 DIAGNOSIS — K219 Gastro-esophageal reflux disease without esophagitis: Secondary | ICD-10-CM | POA: Diagnosis not present

## 2014-11-27 DIAGNOSIS — I255 Ischemic cardiomyopathy: Secondary | ICD-10-CM

## 2014-11-27 DIAGNOSIS — E785 Hyperlipidemia, unspecified: Secondary | ICD-10-CM | POA: Diagnosis not present

## 2014-11-27 DIAGNOSIS — I5042 Chronic combined systolic (congestive) and diastolic (congestive) heart failure: Secondary | ICD-10-CM | POA: Diagnosis present

## 2014-11-27 DIAGNOSIS — I2510 Atherosclerotic heart disease of native coronary artery without angina pectoris: Secondary | ICD-10-CM | POA: Insufficient documentation

## 2014-11-27 DIAGNOSIS — J986 Disorders of diaphragm: Secondary | ICD-10-CM | POA: Diagnosis present

## 2014-11-27 DIAGNOSIS — M069 Rheumatoid arthritis, unspecified: Secondary | ICD-10-CM | POA: Diagnosis present

## 2014-11-27 DIAGNOSIS — I447 Left bundle-branch block, unspecified: Secondary | ICD-10-CM

## 2014-11-27 DIAGNOSIS — E1139 Type 2 diabetes mellitus with other diabetic ophthalmic complication: Secondary | ICD-10-CM | POA: Diagnosis present

## 2014-11-27 HISTORY — DX: Nonrheumatic mitral (valve) insufficiency: I34.0

## 2014-11-27 HISTORY — DX: Long term (current) use of systemic steroids: Z79.52

## 2014-11-27 HISTORY — DX: Chronic atrial fibrillation, unspecified: I48.20

## 2014-11-27 HISTORY — DX: Chronic kidney disease, stage 3 (moderate): N18.3

## 2014-11-27 HISTORY — DX: Rheumatoid arthritis, unspecified: M06.9

## 2014-11-27 HISTORY — DX: Acute myocardial infarction, unspecified: I21.9

## 2014-11-27 HISTORY — DX: Chronic kidney disease, stage 3 unspecified: N18.30

## 2014-11-27 HISTORY — DX: Type 2 diabetes mellitus without complications: E11.9

## 2014-11-27 HISTORY — DX: Pain in unspecified hip: M25.559

## 2014-11-27 HISTORY — DX: Spinal stenosis, site unspecified: M48.00

## 2014-11-27 HISTORY — DX: Chest pain, unspecified: R07.9

## 2014-11-27 HISTORY — DX: Hyperkalemia: E87.5

## 2014-11-27 HISTORY — DX: Other chronic pain: G89.29

## 2014-11-27 HISTORY — DX: Left bundle-branch block, unspecified: I44.7

## 2014-11-27 LAB — I-STAT TROPONIN, ED
Troponin i, poc: 0.03 ng/mL (ref 0.00–0.08)
Troponin i, poc: 0.04 ng/mL (ref 0.00–0.08)

## 2014-11-27 LAB — CBG MONITORING, ED: Glucose-Capillary: 198 mg/dL — ABNORMAL HIGH (ref 70–99)

## 2014-11-27 LAB — BASIC METABOLIC PANEL
Anion gap: 18 — ABNORMAL HIGH (ref 5–15)
BUN: 33 mg/dL — ABNORMAL HIGH (ref 6–23)
CALCIUM: 9.9 mg/dL (ref 8.4–10.5)
CO2: 19 mEq/L (ref 19–32)
Chloride: 95 mEq/L — ABNORMAL LOW (ref 96–112)
Creatinine, Ser: 1.42 mg/dL — ABNORMAL HIGH (ref 0.50–1.35)
GFR calc Af Amer: 54 mL/min — ABNORMAL LOW (ref >90)
GFR calc non Af Amer: 47 mL/min — ABNORMAL LOW (ref >90)
GLUCOSE: 270 mg/dL — AB (ref 70–99)
Potassium: 5.3 mEq/L (ref 3.7–5.3)
Sodium: 132 mEq/L — ABNORMAL LOW (ref 137–147)

## 2014-11-27 LAB — CBC
HEMATOCRIT: 40.2 % (ref 39.0–52.0)
Hemoglobin: 13.2 g/dL (ref 13.0–17.0)
MCH: 31.8 pg (ref 26.0–34.0)
MCHC: 32.8 g/dL (ref 30.0–36.0)
MCV: 96.9 fL (ref 78.0–100.0)
Platelets: 182 10*3/uL (ref 150–400)
RBC: 4.15 MIL/uL — ABNORMAL LOW (ref 4.22–5.81)
RDW: 16.7 % — AB (ref 11.5–15.5)
WBC: 17.5 10*3/uL — AB (ref 4.0–10.5)

## 2014-11-27 LAB — PRO B NATRIURETIC PEPTIDE: PRO B NATRI PEPTIDE: 6703 pg/mL — AB (ref 0–450)

## 2014-11-27 LAB — PROTIME-INR
INR: 2.6 — ABNORMAL HIGH (ref 0.00–1.49)
Prothrombin Time: 28 seconds — ABNORMAL HIGH (ref 11.6–15.2)

## 2014-11-27 LAB — GLUCOSE, CAPILLARY: GLUCOSE-CAPILLARY: 278 mg/dL — AB (ref 70–99)

## 2014-11-27 LAB — TROPONIN I: Troponin I: 0.33 ng/mL (ref <0.30)

## 2014-11-27 MED ORDER — GUAIFENESIN ER 600 MG PO TB12
600.0000 mg | ORAL_TABLET | Freq: Two times a day (BID) | ORAL | Status: DC
  Administered 2014-11-27 – 2014-11-30 (×6): 600 mg via ORAL
  Filled 2014-11-27 (×6): qty 1

## 2014-11-27 MED ORDER — OXYCODONE-ACETAMINOPHEN 5-325 MG PO TABS: 1.0000 | ORAL_TABLET | ORAL | Status: DC | PRN

## 2014-11-27 MED ORDER — IPRATROPIUM BROMIDE 0.02 % IN SOLN
0.5000 mg | Freq: Once | RESPIRATORY_TRACT | Status: AC
  Administered 2014-11-27: 0.5 mg via RESPIRATORY_TRACT
  Filled 2014-11-27: qty 2.5

## 2014-11-27 MED ORDER — DILTIAZEM LOAD VIA INFUSION
10.0000 mg | Freq: Once | INTRAVENOUS | Status: AC
  Administered 2014-11-27: 10 mg via INTRAVENOUS
  Filled 2014-11-27: qty 10

## 2014-11-27 MED ORDER — ROSUVASTATIN CALCIUM 10 MG PO TABS
20.0000 mg | ORAL_TABLET | Freq: Every morning | ORAL | Status: DC
  Administered 2014-11-28 – 2014-11-30 (×3): 20 mg via ORAL
  Filled 2014-11-27 (×3): qty 2

## 2014-11-27 MED ORDER — ONDANSETRON HCL 4 MG/2ML IJ SOLN: 4.0000 mg | Freq: Four times a day (QID) | INTRAMUSCULAR | Status: DC | PRN

## 2014-11-27 MED ORDER — NITROGLYCERIN 0.4 MG SL SUBL
0.4000 mg | SUBLINGUAL_TABLET | SUBLINGUAL | Status: DC | PRN
  Administered 2014-11-28 – 2014-11-29 (×8): 0.4 mg via SUBLINGUAL
  Filled 2014-11-27 (×8): qty 1

## 2014-11-27 MED ORDER — METOPROLOL SUCCINATE ER 25 MG PO TB24
37.5000 mg | ORAL_TABLET | Freq: Two times a day (BID) | ORAL | Status: DC
  Administered 2014-11-27 – 2014-11-28 (×2): 37.5 mg via ORAL
  Filled 2014-11-27 (×2): qty 2

## 2014-11-27 MED ORDER — WARFARIN - PHARMACIST DOSING INPATIENT
Freq: Every day | Status: DC
  Administered 2014-11-27 – 2014-11-29 (×2)

## 2014-11-27 MED ORDER — TORSEMIDE 20 MG PO TABS
20.0000 mg | ORAL_TABLET | Freq: Once | ORAL | Status: AC
  Administered 2014-11-27: 20 mg via ORAL
  Filled 2014-11-27: qty 1

## 2014-11-27 MED ORDER — WARFARIN SODIUM 7.5 MG PO TABS
7.5000 mg | ORAL_TABLET | ORAL | Status: DC
  Administered 2014-11-28 – 2014-11-29 (×2): 7.5 mg via ORAL
  Filled 2014-11-27 (×2): qty 1

## 2014-11-27 MED ORDER — TORSEMIDE 20 MG PO TABS: 20.0000 mg | ORAL_TABLET | ORAL | Status: DC

## 2014-11-27 MED ORDER — IPRATROPIUM BROMIDE 0.02 % IN SOLN
0.5000 mg | Freq: Three times a day (TID) | RESPIRATORY_TRACT | Status: DC
  Administered 2014-11-27: 0.5 mg via RESPIRATORY_TRACT
  Filled 2014-11-27: qty 2.5

## 2014-11-27 MED ORDER — ACETAMINOPHEN 325 MG PO TABS: 650.0000 mg | ORAL_TABLET | Freq: Three times a day (TID) | ORAL | Status: DC | PRN

## 2014-11-27 MED ORDER — WARFARIN SODIUM 7.5 MG PO TABS
7.5000 mg | ORAL_TABLET | ORAL | Status: AC
  Administered 2014-11-27: 7.5 mg via ORAL
  Filled 2014-11-27 (×2): qty 1

## 2014-11-27 MED ORDER — TORSEMIDE 20 MG PO TABS
40.0000 mg | ORAL_TABLET | ORAL | Status: DC
  Administered 2014-11-28: 40 mg via ORAL
  Filled 2014-11-27: qty 2

## 2014-11-27 MED ORDER — LEVALBUTEROL HCL 0.63 MG/3ML IN NEBU: 0.6300 mg | INHALATION_SOLUTION | Freq: Four times a day (QID) | RESPIRATORY_TRACT | Status: DC | PRN

## 2014-11-27 MED ORDER — INSULIN ASPART 100 UNIT/ML ~~LOC~~ SOLN
0.0000 [IU] | Freq: Three times a day (TID) | SUBCUTANEOUS | Status: DC
  Administered 2014-11-28 (×2): 3 [IU] via SUBCUTANEOUS
  Administered 2014-11-28: 2 [IU] via SUBCUTANEOUS
  Administered 2014-11-29 (×2): 3 [IU] via SUBCUTANEOUS
  Administered 2014-11-29: 5 [IU] via SUBCUTANEOUS
  Administered 2014-11-30: 1 [IU] via SUBCUTANEOUS

## 2014-11-27 MED ORDER — CALCIUM CARBONATE 1250 (500 CA) MG PO TABS
1250.0000 mg | ORAL_TABLET | Freq: Every day | ORAL | Status: DC
  Administered 2014-11-27 – 2014-11-29 (×3): 1250 mg via ORAL
  Filled 2014-11-27 (×3): qty 3

## 2014-11-27 MED ORDER — PANTOPRAZOLE SODIUM 40 MG PO TBEC
40.0000 mg | DELAYED_RELEASE_TABLET | Freq: Every day | ORAL | Status: DC
  Administered 2014-11-28 – 2014-11-30 (×3): 40 mg via ORAL
  Filled 2014-11-27 (×3): qty 1

## 2014-11-27 MED ORDER — PREDNISONE 5 MG PO TABS
5.0000 mg | ORAL_TABLET | Freq: Two times a day (BID) | ORAL | Status: DC
  Administered 2014-11-28 – 2014-11-30 (×5): 5 mg via ORAL
  Filled 2014-11-27 (×5): qty 1

## 2014-11-27 MED ORDER — INSULIN GLARGINE 100 UNIT/ML ~~LOC~~ SOLN
25.0000 [IU] | Freq: Every morning | SUBCUTANEOUS | Status: DC
  Administered 2014-11-28: 25 [IU] via SUBCUTANEOUS
  Filled 2014-11-27 (×2): qty 0.25

## 2014-11-27 MED ORDER — ASPIRIN EC 81 MG PO TBEC
81.0000 mg | DELAYED_RELEASE_TABLET | Freq: Every day | ORAL | Status: DC
Start: 2014-11-28 — End: 2014-11-30
  Administered 2014-11-28 – 2014-11-30 (×3): 81 mg via ORAL
  Filled 2014-11-27 (×3): qty 1

## 2014-11-27 MED ORDER — DILTIAZEM HCL 100 MG IV SOLR
5.0000 mg/h | INTRAVENOUS | Status: DC
  Administered 2014-11-27: 5 mg/h via INTRAVENOUS

## 2014-11-27 MED ORDER — HYDRALAZINE HCL 25 MG PO TABS
25.0000 mg | ORAL_TABLET | Freq: Three times a day (TID) | ORAL | Status: DC
  Administered 2014-11-27 – 2014-11-30 (×8): 25 mg via ORAL
  Filled 2014-11-27 (×8): qty 1

## 2014-11-27 MED ORDER — OMEGA-3-ACID ETHYL ESTERS 1 G PO CAPS
1.0000 g | ORAL_CAPSULE | Freq: Every day | ORAL | Status: DC
  Administered 2014-11-28 – 2014-11-30 (×3): 1 g via ORAL
  Filled 2014-11-27 (×3): qty 1

## 2014-11-27 MED ORDER — FLUTICASONE PROPIONATE 50 MCG/ACT NA SUSP
2.0000 | Freq: Every day | NASAL | Status: DC
  Administered 2014-11-28 – 2014-11-30 (×4): 2 via NASAL
  Filled 2014-11-27: qty 16

## 2014-11-27 MED ORDER — ALBUTEROL SULFATE (2.5 MG/3ML) 0.083% IN NEBU
5.0000 mg | INHALATION_SOLUTION | Freq: Once | RESPIRATORY_TRACT | Status: AC
  Administered 2014-11-27: 5 mg via RESPIRATORY_TRACT
  Filled 2014-11-27: qty 6

## 2014-11-27 MED ORDER — SODIUM CHLORIDE 0.9 % IV SOLN
INTRAVENOUS | Status: DC
  Administered 2014-11-27: 12:00:00 via INTRAVENOUS

## 2014-11-27 MED ORDER — ASPIRIN 81 MG PO CHEW
324.0000 mg | CHEWABLE_TABLET | Freq: Once | ORAL | Status: AC
  Administered 2014-11-27: 324 mg via ORAL
  Filled 2014-11-27: qty 4

## 2014-11-27 MED ORDER — LEVOTHYROXINE SODIUM 75 MCG PO TABS
175.0000 ug | ORAL_TABLET | Freq: Every day | ORAL | Status: DC
  Administered 2014-11-28 – 2014-11-30 (×3): 175 ug via ORAL
  Filled 2014-11-27 (×6): qty 1

## 2014-11-27 MED ORDER — LEVALBUTEROL HCL 0.63 MG/3ML IN NEBU
0.6300 mg | INHALATION_SOLUTION | Freq: Four times a day (QID) | RESPIRATORY_TRACT | Status: DC
Start: 2014-11-27 — End: 2014-11-27

## 2014-11-27 MED ORDER — LINAGLIPTIN 5 MG PO TABS
5.0000 mg | ORAL_TABLET | Freq: Every day | ORAL | Status: DC
  Administered 2014-11-28 – 2014-11-30 (×3): 5 mg via ORAL
  Filled 2014-11-27 (×3): qty 1

## 2014-11-27 MED ORDER — TORSEMIDE 20 MG PO TABS: 20.0000 mg | ORAL_TABLET | Freq: Every day | ORAL | Status: DC

## 2014-11-27 MED ORDER — WARFARIN SODIUM 10 MG PO TABS: 10.0000 mg | ORAL_TABLET | ORAL | Status: DC

## 2014-11-27 MED ORDER — ISOSORBIDE MONONITRATE ER 60 MG PO TB24
60.0000 mg | ORAL_TABLET | Freq: Every day | ORAL | Status: DC
  Administered 2014-11-28 – 2014-11-30 (×3): 60 mg via ORAL
  Filled 2014-11-27 (×3): qty 1

## 2014-11-27 NOTE — Progress Notes (Signed)
CRITICAL VALUE ALERT  Critical value received:  Troponin 0.33  Date of notification:  11/27/14  Time of notification:  2005  Critical value read back: yes  Nurse who received alert:  Dannial Monarch, RN --> relayed to Judeth Cornfield primary RN on night shift  MD notified (1st page):  Dr. Adolm Joseph  Time of first page:  2008  MD notified (2nd page):  Time of second page:  Responding MD:    Time MD responded:

## 2014-11-27 NOTE — ED Notes (Signed)
CBG 198 

## 2014-11-27 NOTE — Progress Notes (Signed)
ANTICOAGULATION CONSULT NOTE - Initial Consult  Pharmacy Consult for Coumadin Indication: atrial fibrillation  Allergies  Allergen Reactions  . Butrans [Buprenorphine] Shortness Of Breath  . Influenza Vaccines Other (See Comments)    Severe coughing for weeks after vaccine; also had flu like symptoms for about 7 to 8 weeks.   . Lanoxin [Digoxin] Other (See Comments)    Nausea, anorexia, caused tremendous weight loss  . Ranexa [Ranolazine Er] Other (See Comments)    TOLD NEVER TO TAKE MED-PER MD  . Lisinopril Other (See Comments)    cough  . Clarithromycin Other (See Comments)    Unknown, thinks "blisters in mouth"  . Statins Other (See Comments)    unknown    Patient Measurements: Height: 6' (182.9 cm) Weight: 210 lb (95.255 kg) IBW/kg (Calculated) : 77.6  Vital Signs: Temp: 97.5 F (36.4 C) (12/15 1504) Temp Source: Oral (12/15 1504) BP: 98/60 mmHg (12/15 1717) Pulse Rate: 80 (12/15 1717)  Labs:  Recent Labs  11/26/14 11/27/14 1049 11/27/14 1506  HGB  --  13.2  --   HCT  --  40.2  --   PLT  --  182  --   LABPROT  --   --  28.0*  INR 2  --  2.60*  CREATININE  --  1.42*  --     Estimated Creatinine Clearance: 53.8 mL/min (by C-G formula based on Cr of 1.42).   Medical History: Past Medical History  Diagnosis Date  . Pleuritis     h/o------ with negative vats biopsy  . Acute myocardial infarction, unspecified site, episode of care unspecified   . Hypertension   . Coronary artery disease     a. CABG 1998. b. NSTEMI 02/2013: Progression of dz in SVG-RPDA followed by severe stenosis in grafted vessel, not likely safely reacted for PCI - med rx. c. NSTEMI 10/2014: patent LIMA-LAD and SVG to OM, TO SVG-PDA with TO PDA and 60% proximal RCA, some left =>PDA collaterals; culprit was likely occlusion of SVG-PDA though this may have been subacute given some collaterals.  . Diabetes mellitus   . 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 Chronic anticoagulation, on coumadin 01/12/2013  . Cardiomyopathy, ischemic EF 25%   . Hypothyroidism   . GERD (gastroesophageal reflux disease)   . Arthritis     all over  . Chronic atrial fibrillation   . Digitalis toxicity   . Chronic systolic CHF (congestive heart failure)     a. EF prev 25%, improved to 45-50% in 2014. b. 10/2014: EF back downt o 25%.  [name] Hyperlipidemia   . LBBB (left bundle branch block)   . CKD (chronic kidney disease), stage III   . Rheumatoid arthritis   . Long term current use of systemic steroids     a. For RA.  [name] Spinal stenosis   . Hip pain     a. adductor muscle tears on prior MRI.  [name] Chronic chest pain     a. Intolerant of Ranexa.  . Hyperkalemia     a. Spironolactone d/c'd due to this.  . Elevated hemidiaphragm   . Moderate mitral regurgitation 10/2014 echo    Medications:  See electronic med rec  Assessment: 73 y.o. male presents with CP, cough. Pt on coumadin PTA for afib. Home dose: 10mg  on Mondays and 7.5mg  all other days - pt has not taken dose today. Admit INR 2.6 (therapeutic). CBC stable.  Goal of Therapy:  INR 2-3 Monitor  platelets by anticoagulation protocol: Yes   Plan:  1. Daily INR 2. Continue home coumadin 7.5mg  daily except for 10mg  on Mondays  03-12-1976, PharmD, BCPS Clinical pharmacist, pager [phone] 11/27/2014,5:21 PM

## 2014-11-27 NOTE — ED Notes (Addendum)
Pt states he was just d/c'd from hospital on 11/10. States he woke up from having cp this morning at 0600 to left chest radiating under arm and into left shoulder. Stays SOB. Has taken more than 4 NTG with some relief.

## 2014-11-27 NOTE — ED Notes (Signed)
Attempted to call floor nurse again, spoke to charge nurse

## 2014-11-27 NOTE — ED Notes (Signed)
Attempted to give floor RN report

## 2014-11-27 NOTE — H&P (Signed)
Cardiology History and Physical Note  Patient ID: [name], MRN: [id number], DOB/AGE: 1941/09/26 73 y.o. Admit date: 11/27/2014   Date of Consult: 11/27/2014 Primary Physician: Thressa Sheller, MD Primary Cardiologist: Berry/CHF Vesper  Chief Complaint: CP, cough Reason for Consult: CP  HPI: [name] is a 73 y/o M with extremely complex PMH to include chronic systolic CHF/ICM EF (51% 88/4166, previously 45-50% in 2014 but previously lower), HTN, DM2, HLD, CAD s/p CABG 1998 (NSTEMI 3/14 - med rx after cath, recurrent NSTEMI 10/2014-> cath as below, med rx), chronic angina, chronic atrial fibrillation, moderate MR 10/2014, LBBB, CKD stage III, rheumatoid arthritis on chronic steroids, severe spinal stenosis, chronic elevated left hemidiaphragm, left hip pain with adductor muscle tears on prior MRI, and prior question of ILD from RA in the past but no evidence of this on CT 2011.  He was admitted 10/2014 with CAP, SOB, fever, AF-RVR and NSTEMI c/b cardiogenic shock requiring IV amiodarone, IABP and milrinone. LHC (10/16/14): patent LIMA-LAD and SVG to OM, TO SVG-PDA with TO PDA and 60% proximal RCA, some left =>PDA collaterals; RHC showed very high filling pressures at [place].Angiography as above showed that culprit was likely occlusion of SVG-PDA though this may have been subacute given some collaterals.There was no good target for intervention and imdur was increased. He has been intolerant of Ranexa. He was seen by Dr. Aundra Dubin 11/13/14 at which time spironolactone was d/c'd due to hyperkalemia and torsemide was decreased since weight was down. The plan was to repeat echo 01/2015 and if EF <35% would need referral for CRT-D. With regard to chronic dyspnea, this was felt mulfifactorial due to deconditioning and elevated hemidiaphragm. There was consideration for interstitial lung disease from rheumatoid arthritis in the past, but CT did not show evidence for ILD. He has an oxygen tank at home  but does not use it because he was told he does not need it (and is awaiting decision from our office from some kind of home health study). He has an appt with pulm 12/22. His family is concerned because he is gradually able to do less and less. He has a fantastic sense of humor and multiple great-grandchildren who "keep him going."  He presented back to [facility] today with chest pain and cough. He goes through spurts where sometimes he goes 10-12 days without having to take NTG, and other weeks where he takes it several days in a row. He has been taking it several times a day the last few days. This morning around 6am he awoke with severe substernal chest pain. He took 2 SL NTG with relief, but the pain came back so he took 1 more. This eased off the pain for an hour but over the next few hours he took 9 SL NTG in total (he is aware of the recommendation to seek medical attention after 3 but family reports he is stubborn). He has chronic dyspnea which is gradually worsening. He had a steroid injection in his hip yesterday and he "felt like a new man" as far as hip pain goes. No significant palpitations, syncope, LEE, orthopnea, nausea, vomiting, bleeding. + Chronic orthopnea for several years - sleeps in a recliner. Weight only up 2lbs from office wt on 12/1.  In the ED he was noted to be in AF RVR 130's - was given 365m ASA, albuterol/atrovent nebs, diltiazem 174mbolus then drip. Chest pain improved with HR control. He is presently 60-70bpm. He is chest pain free.  Also reports a mostly nonproductive rattley cough for 4 days which has improved with Mucinex (no decongestants). No sick contacts. No fever/chills - initial temp 99.6 but repeat 97.5. Labs significant for leukocytosis WBC 17.5 (prev 15.5),  K 5.3, BUN/Cr 33/1.42, glu 270, Na 132 (corrects to nl at 137), troponin neg x 1, pBNP 6700. WBC climbing since November, also mildly elevated in 2014. CXR: nonacute.   Studies: Echo (10/15/14): EF  25%, mod calcified annulus mitral valve, mod MR, RV sys fx mildly reduced LHC (10/16/14): patent LIMA-LAD and SVG to OM, TO SVG-PDA with TO PDA and 60% proximal RCA, some left =>PDA collaterals;  RHC (10/16/14): RA 20, RV 69/11, PA 69/36 (35), PCWP 35, PA sat: 46% on 100% NRB, Fick CO/CI: 3.15/1.43, Thermo CO/CI: 3.61/1.63  Wt Readings from Last 3 Encounters:  11/27/14 210 lb (95.255 kg)  11/13/14 208 lb 12.8 oz (94.711 kg)  10/29/14 213 lb 2 oz (96.673 kg)   Past Medical History  Diagnosis Date  . Pleuritis     h/o------ with negative vats biopsy  . Acute myocardial infarction, unspecified site, episode of care unspecified   . Hypertension   . Coronary artery disease     a. CABG 1998. b. NSTEMI 10/2014: patent LIMA-LAD and SVG to OM, TO SVG-PDA with TO PDA and 60% proximal RCA, some left =>PDA collaterals; culprit was likely occlusion of SVG-PDA though this may have been subacute given some collaterals.  . Diabetes mellitus   . 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 Chronic anticoagulation, on coumadin 01/12/2013  . Cardiomyopathy, ischemic EF 25%   . Hypothyroidism   . GERD (gastroesophageal reflux disease)   . Arthritis     all over  . Chronic atrial fibrillation   . Digitalis toxicity   . Chronic systolic CHF (congestive heart failure)     a. EF prev 25%, improved to 45-50% in 2014. b. 10/2014: EF back downt o 25%.  [name] Hyperlipidemia   . LBBB (left bundle branch block)   . CKD (chronic kidney disease), stage III   . Rheumatoid arthritis   . Long term current use of systemic steroids     a. For RA.  [name] Spinal stenosis   . Hip pain     a. adductor muscle tears on prior MRI.  [name] Chronic chest pain     a. Intolerant of Ranexa.  . Hyperkalemia     a. Spironolactone decreased due to this.  . Elevated hemidiaphragm   . Moderate mitral regurgitation 10/2014 echo      Most Recent Cardiac Studies: 2D echo 10/2014 - Left ventricle: The  cavity size was normal. Wall thickness was increased in a pattern of mild LVH. Diffuse hypokinesis with akinesis of the apex, the apical septal wall, and the inferolateral wall. Indeterminant diastolic function. The estimated ejection fraction was 25%. - Aortic valve: There was no stenosis. There was trivial regurgitation. - Mitral valve: Moderately calcified annulus. Mildly calcified leaflets . There was moderate regurgitation. - Left atrium: The atrium was moderately dilated. - Right ventricle: Systolic function was mildly reduced. - Right atrium: The atrium was mildly to moderately dilated. - Pulmonary arteries: No complete TR doppler jet so unable to estimate PA systolic pressure. - Systemic veins: IVC measured 1.8 cm with < 50% respirophasic variation, suggesting RA pressure 8 mmHg. Impressions: - Normal LV size with mild LV hypertrophy. EF 25% with wall motion abnormalities as noted above. Normal RV size with mildly decreased systolic  function. Biatrial enlargement. Moderate MR, likely infarct-related given akinetic inferolateral wall.  Cath 10/16/14 CARDIAC CATHETERIZATION REPORT NAME: [name] [id number] DOB: 1942/04/30ADMIT DATE: 10/14/2014 Procedure Date: 10/16/2014 INTERVENTIONAL CARDIOLOGIST: Leonie Man, M.D., MS PRIMARY CARE PROVIDER: Thressa Sheller, MD PRIMARY CARDIOLOGIST: Lorretta Harp., MD REFERRING CARDIOLOGIST: Loralie Champagne, MD PATIENT: [name] is a 73 y.o. malewITH HISTORY OF KNOWN cad STATUS POST cabg IN 1998 WITH A MODERATE ISCHEMIC CARDIOPATHY ef 40-45% AND CHRONIC ATRIAL FIBRILLATION. He also has hypertension, diabetes and hyperlipidemia. 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 He presented to [facility] on November 1 for severe dyspnea and chest pain/pressure, he is ruled in for non-ST elevation MI in his been found to be in A. Fib with RVR.  Initial plans were to stabilizing and then pursue cardiac catheterization once more stable. Fortunately, he developed more chest pain this morning with rapid A. Fib. With positive troponins and recurrent pain, he was seen by Dr. Aundra Dubin who felt that we needed to proceed with cardiac catheterization. He's been referred for right and left heart catheterization. With an INR of 1.8, Physician will be done via the radial approach. He has baseline renal insufficiency therefore a ventriculogram will not the performed. PRE-OPERATIVE DIAGNOSIS:   Non-STEMI  A. 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 PROCEDURES PERFORMED:   Right & Left Heart Catheterization with Native Coronary and Graft Angiography via RIGHT COMMON FEMORA Artery & Vein Access.  INTRA-AORTIC BALLOON PUMP PLACEMENT - for decompensated Combined CHF PROCEDURE: The patient was brought to the [facility]nd [facility] in the fasting state and prepped and draped in the usual sterile fashion for left radial or right femoral arterial and right femoral venous access. A modified Allen's test was performed on the left wrist demonstrating minimal but adequate collateral flow. Sterile technique was used including antiseptics, cap, gloves, gown, hand hygiene, mask and sheet. Skin prep: Chlorhexidine.  Consent: Risks of procedure as well as the alternatives and risks of each were explained to the (patient/caregiver). Consent for procedure obtained.  Time Out: Verified patient identification, verified procedure, site/side was marked, verified correct patient position, special equipment/implants available, medications/allergies/relevent history reviewed, required imaging and test results available. Performed. Access:   Left Radial  Artery: 6 Fr sheath -- Seldinger technique using Angiocath Micropuncture Kit  10 mL radial cocktail IA;   Shortly after Obtaining Radial Access - RHC numbers reviewed with referring MD, the decision was made to place IABP, therefore, RFA access was obtained & LHC-Angio was performed fromFemoral Approach.  VASC band: 1045 hours; 15 mL, once LIMA graft injected from RFA approach. --> reduced to 12 mL Air @ 11:15  Right Common Femoral Artery: 5 Fr Sheath - fluoroscopically guided modified Seldinger Technique   Right Common Femoral Vein: 7 Fr Sheath - Seldinger Technique.. Right Heart Catheterization: 7 Fr Gordy Councilman catheter advanced under fluoroscopy with balloon inflated to the RA, RV, then PCWP-PA for hemodynamic measurement. A 0.25 mm wire was used to assist advancing the catheter into the WEDGE position. Once pressures recorded, the cathter was pulled back proximal to the WEDGE position.  Simultaneous FA & PA blood gases checked for SaO2% to calculate FICK CO/CI   Thermodilution Injections performed to calculate CO/CI   Simultaneous PCWP/LV & RV/LV pressures monitored with Angled  Pigtail in LV.   Catheter removed completely out of the body with balloon deflated.  (Unfortunately, once the initial #s were checked - the decision was made to keep RHC in place, so a new catheter was inserted using a protective catheter cover) -- advanced to 75 cm, but unable to "WEDGE" due to a dilated RA with catheter loop.  Left Heart Catheterization: 5Fr Catheters advanced or exchanged over a J-wire; JR catheter advanced first.   LIMA-LAD Cineangiography: JR4 Catheter redirected into Left Subclavian Artery   Right Coronary Artery, SVG-RCA & SVG-OM Cineangiography: JR4 Catheter (RCB used to attempt better visualization of SVG-RCA  Left Coronary Artery Cineangiography: JL4 Catheter  LV Hemodynamics (LV Gram): RCB RFA & RFV Sheaths sutured in place & dressed. FINDINGS:  Hemodynamics:   Findings:   SaO2% ;on 100% NRB Pressures mmHg  Mean P  mmHg  EDP  mmHg   Right Atrium   */20  20  Right Ventricle   69/11  17   Pulmonary Artery  46 69/36  35   PCWP   **/35 35    Central Aortic  94 111/77 89   Left Ventricle   105/29  35         Cardiac Output:   Cardiac Index:    Fick  3.15  1.43   Thermodilution  3.61  1.63   Left Ventriculography: Deferred to conserve contrast Coronary Anatomy:  Left Main: Large caliber vessel with notable calcification, but no significant stenosis. Left Anterior Descending (LAD): Likely large caliber vessel with diffuse proximal disease, then occluded just after the First Diagonal (D1), which is also essentially occluded. Circumflex (LCx): ~90-95% mid-vessel stenosis with to-fro-flow noted in the major lateral OM. The distal vessel continues as a very small caliber, diffusely diseased Left posterolateral branch.  Right Coronary Artery: dominant with a 60% proximal eccentric stenosis. The PDA had minimal flow and was 100% totally occluded. The continuation to the significant PLA branch is widely patent.  posterior descending artery: there is an occluded stent at what appears to be the ostium. Fills retrograde via LAD-PDA collaterals after LIMA-LAD  posterior lateral branch: Fills both antegrade from the native RCA rettrograde (as well as PDA-PL collaterals) from the SVG-PDA.  Grafts  LIMA - Diag - LAD: Widely patent with excellent antegrade flow down both the diagonal and the LAD.  SVG - OM: Widely patent to a large lateral OM. Retrograde flow fills back to the AV Groove Circumflex and fills a smaller OM and a LPL branch.  SVG - RPDA: 100% aorta ostial occluded After reviewing the initial angiography, the culprit lesion was thought to be the occluded SVG-RPDA.  Based upon the severity of the right heart catheter numbers and decompensated heart failure, the decision was made  to place an Intra-Aortic Balloon Pump (IABP) INTRA-AORTIC BALLOON PUMP PLACEMENT:   5 French RFA sheath exchanged for 7.5 French IABP sheath  40 cm IABP advanced over Kit wire to the proximal descending aorta; catheter was aspirated and then flushed.  Pressure lines were attached and after purging the balloon pump was started at 1:1  Initial aortic pressure is 104/77 mmHg with augmentation to 146 mmHg. MEDICATIONS:  Anesthesia: Local Lidocaine 2 mL left radial; 18 mL right groin  Sedation: none ;   Oxygen by 100% nonrebreather  Omnipaque Contrast: 90 ml  Radial Cocktail: 5 mg Verapamil, 400 mcg NTG, 2 ml 2% Lidocaine in 10 ml NS  IV Amiodarone bolus 150 mg  IV Lasix 40 mg 2  PATIENT DISPOSITION:   The patient was transferred to the PACU holding area in a hemodynamicaly stable, chest pain free condition.  The patient tolerated the procedure well, and there were no complications.   EBL: < 74m, not including ABG and VBG samples   The patient was stable before, during, and after the procedure, but required 100% non-rebreather oxygen. POST-OPERATIVE DIAGNOSIS:   Severe decompensated Combined Right and Left Heart Failure complicated by atrial fibrillation with RVR  Severe Secondary Pulmonary Hypertension with Left Ventricular Filling Pressures of ~~38mmHg; PA diastolic, PCWP and LVEDP pressures all are equal and correlate  Severe native vessel CAD the left system with 100% LAD, severe Circumflex disease, and moderate proximal RCA with occluded RPDA  New diagnosis of occlusion of the SVG-RPDA with collateral flow from LIMA-LAD to the PDA.  Excellent augmentation with IABP  Improved Ventricular response to Atrial Fibrillation after bolus of amiodarone PLAN OF CARE:  The patient will be transferred to the CCU for diuresis with Swan-Ganz catheter and IABP in place.  Once he is stabilized, can consider the possibility of intervening on the proximal RCA lesion, however  this does not look too much different than it did a year and a half ago.  Restart IV heparin infusion for IABP 6 hours after VASC band removal. HLeonie Man M.D., M.S. [facility] [address] [place] [id number]/02/2014   Surgical History:  Past Surgical History  Procedure Laterality Date  . Bypass graft    . Lung surgery    . Knee surgery    . Carpal tunnel release    . Cataract extraction    . Coronary artery bypass graft    . Cardiac catheterization Left 01/11/2013    Severe CAD with notable progression of disease in the SVG-RPDA followed by severe stenosis in grafted vessel, region not safely "reachede" for PCI, moderate ischemic CM no notable chage in EF, continue medical therapy  . Cardiac catheterization  04/03/2011    Left circumflex 95% stenosis, essentially unchanged from last cath  . Cardiac catheterization  04/01/2010    LAD 99% proximal stenosis with vague flow, circumflex 80% stenosis, essentially unchanged form last cath  . Cardiac catheterization  03/28/2010    Mild pulmonary hypertension, medical therapy  . Cardiac catheterization  04/30/2009    LAD 95% stenosis, essentially unchanged fomr last cath, medical therapy  . Cardiac catheterization  12/05/2008    2.5x12 apex, predilation in the distal PLA branch, 3x23 XIENCE V durg eluting stent and post dilated with a 3.25x20 [place] VOYAGER at 15 atmospheres  . Left heart catheterization with coronary/graft angiogram N/A 01/13/2013    Procedure: LEFT HEART CATHETERIZATION WITH CBeatrix Fetters  Surgeon: DLeonie Man MD;  [facility];  Service: Cardiovascular;  Laterality: N/A;  . Left and right heart catheterization with coronary/graft angiogram N/A 10/16/2014    Procedure: LEFT AND RIGHT HEART CATHETERIZATION WITH CBeatrix Fetters  Surgeon: DLeonie Man MD;  [facility];  Service: Cardiovascular;  Laterality: N/A;      Home Meds: Prior to Admission medications   Medication Sig Start Date End Date Taking? Authorizing Provider  acetaminophen (TYLENOL) 650 MG CR tablet Take 650 mg by mouth every 8 (eight) hours as needed. For pain    Historical Provider, MD  aspirin EC 81 MG tablet Take 81 mg by mouth daily.    Historical Provider, MD  calcium carbonate (OS-CAL) 600 MG TABS Take 600 mg  by mouth daily.    Historical Provider, MD  fluticasone (FLONASE) 50 MCG/ACT nasal spray Place 2 sprays into both nostrils daily.     Historical Provider, MD  hydrALAZINE (APRESOLINE) 25 MG tablet Take 1 tablet (25 mg total) by mouth every 8 (eight) hours. 10/23/14   Samella Parr, NP  HYDROcodone-acetaminophen (NORCO/VICODIN) 5-325 MG per tablet Take 1-2 tablets by mouth every 4 (four) hours as needed for moderate pain. 10/23/14   Samella Parr, NP  Insulin Glargine (LANTUS SOLOSTAR) 100 UNIT/ML Solostar Pen Inject 37 Units into the skin daily. 10/23/14   Samella Parr, NP  isosorbide mononitrate (IMDUR) 60 MG 24 hr tablet Take 1 tablet (60 mg total) by mouth daily. 11/16/14   Lorretta Harp, MD  levothyroxine (SYNTHROID, LEVOTHROID) 150 MCG tablet Take 175 mcg by mouth daily.     Historical Provider, MD  metoprolol succinate (TOPROL-XL) 12.5 mg TB24 24 hr tablet Take 1.5 tablets (37.5 mg total) by mouth 2 (two) times daily. 10/23/14   Samella Parr, NP  nitroGLYCERIN (NITROSTAT) 0.4 MG SL tablet Place 1 tablet (0.4 mg total) under the tongue every 5 (five) minutes as needed for chest pain. 11/30/13   Lorretta Harp, MD  nystatin cream (MYCOSTATIN) Apply as needed 12/12/13   Historical Provider, MD  Omega-3 Fatty Acids (FISH OIL) 1000 MG CAPS Take 3,000 mg by mouth every morning.    Historical Provider, MD  omeprazole (PRILOSEC) 20 MG capsule Take 20 mg by mouth daily.    Historical Provider, MD  predniSONE (DELTASONE) 10 MG tablet Take 10 mg by mouth daily with breakfast.    Historical Provider, MD  rosuvastatin  (CRESTOR) 20 MG tablet Take 1 tablet (20 mg total) by mouth daily. 10/23/14   Samella Parr, NP  saxagliptin HCl (ONGLYZA) 5 MG TABS tablet Take 5 mg by mouth daily.    Historical Provider, MD  torsemide (DEMADEX) 20 MG tablet Take 22m (2 tabs) one day, 264m(1 tab) next day, and alternate. 11/13/14   DaLarey DresserMD  triamcinolone cream (KENALOG) 0.1 % Apply 1 application topically daily.  02/20/13   Historical Provider, MD  warfarin (COUMADIN) 5 MG tablet Take 1-2 tablets by mouth daily as directed Patient taking differently: Take 7.5-10 mg by mouth daily at 6 PM. 7.5 mg daily except 1018monday and Friday 10/10/14   KriTommy MedalPH-CPP    Inpatient Medications:    . sodium chloride 75 mL/hr at 11/27/14 1151  . diltiazem (CARDIZEM) infusion 5 mg/hr (11/27/14 1410)    Allergies:  Allergies  Allergen Reactions  . Butrans [Buprenorphine] Shortness Of Breath  . Clarithromycin Other (See Comments)    Unknown, thinks "blisters in mouth"  . Influenza Vaccines Other (See Comments)    Severe coughing for weeks after vaccine; also had flu like symptoms for about 7 to 8 weeks.   . Lanoxin [Digoxin] Other (See Comments)    Nausea, anorexia  . Lisinopril Other (See Comments)    cough  . Ranexa [Ranolazine Er]   . Statins     History   Social History  . Marital Status: Married    Spouse Name: N/A    Number of Children: N/A  . Years of Education: N/A   Occupational History  . Not on file.   Social History Main Topics  . Smoking status: Never Smoker   . Smokeless tobacco: Never Used  . Alcohol Use: No  . Drug Use: No  . Sexual  Activity: Not on file   Other Topics Concern  . Not on file   Social History Narrative     Family History  Problem Relation Age of Onset  . Heart disease Mother   . Heart disease Father   . Heart disease Brother   . Heart disease Maternal Aunt   . Cancer       Review of Systems: All other systems reviewed and are otherwise negative  except as noted above.  Labs:  Troponin x 2  Lab Results  Component Value Date   WBC 17.5* 11/27/2014   HGB 13.2 11/27/2014   HCT 40.2 11/27/2014   MCV 96.9 11/27/2014   PLT 182 11/27/2014    Recent Labs Lab 11/27/14 1049  NA 132*  K 5.3  CL 95*  CO2 19  BUN 33*  CREATININE 1.42*  CALCIUM 9.9  GLUCOSE 270*   Lab Results  Component Value Date   CHOL 197 03/09/2013   HDL 42 03/09/2013   LDLCALC 115* 03/09/2013   TRIG 198* 03/09/2013   Lab Results  Component Value Date   DDIMER 0.28 01/12/2013    Radiology/Studies:  Dg Chest Port 1 View  11/27/2014   CLINICAL DATA:  Cough for 4-5 days, left-sided chest pain  EXAM: PORTABLE CHEST - 1 VIEW  COMPARISON:  10/22/2014  FINDINGS: Cardiac shadow is within normal limits. Postsurgical changes are again seen. The lungs are well aerated. Mild scarring is noted in the left lung base. No acute bony abnormality is noted.  IMPRESSION: No acute abnormality noted.   Electronically Signed   By: Inez Catalina M.D.   On: 11/27/2014 11:37   EKG: Atrial fibrillation with RVR 133bpm, somewhat atypical appearing LBBB, TWI I, avL, no significant change from prior  Physical Exam: Blood pressure 103/68, pulse 63, temperature 99.6 F (37.6 C), temperature source Rectal, resp. rate 25, height 6' (1.829 m), weight 210 lb (95.255 kg), SpO2 96 %. General: Well developed, well nourished WM in no acute distress. Head: Normocephalic, atraumatic, sclera non-icteric, no xanthomas, nares are without discharge.  Neck: JVD minimally elevated. Lungs: +Diffuse rhonchi cleared with coughing. After coughing, good air movement but coarse upper lung base sounds. No wheezes or rales. Breathing is unlabored. Heart: Irregularly iregular with S1 S2. No murmurs, rubs, or gallops appreciated. Abdomen: Soft, non-tender, non-distended with normoactive bowel sounds. No hepatomegaly. No rebound/guarding. No obvious abdominal masses. Msk:  Strength and tone appear normal  for age. Extremities: No clubbing or cyanosis. No edema.  Diminished pedal pulses. Neuro: Alert and oriented X 3. No facial asymmetry. No focal deficit. Moves all extremities spontaneously. Psych:  Responds to questions appropriately with a normal affect.   Assessment and Plan:   1. Acute on chronic chest pain 2. CAD s/p CABG 1998, recent cath for medical therapy 3. Chronic atrial fibrillation with RVR on admission 4. Chronic systolic CHF/ICM/LBBB 5. Cough  6. Leukocytosis 7. RA, on chronic steroids, also with cortisone injection yesterday 8. CKD stage III 9. DM with hyperglycemia, likely worse d/t steroid injection - Na corrects to normal 10. Spinal stenosis and hip pathology with severe deconditioning 11. Chronically elevated hemidiaphragm since 1990's 12. HTN, BP borderline  The patient had worsening of acute on chronic angina this morning. He has a h/o angina (demand-driven) in the past in the setting of AF-RVR so his rapid rates may have precipitated the chest pain. His discomfort resolved with rate control with IV diltiazem in the ED. Question whether his cortisone injection may have  stimulated increased adrenergic state. It's not clear if WBC is r/t steroids or possible infection. It is possible that he may be brewing a bronchitis, but CXR looks OK. Will hold off on antibiotics for now and consider initiation if he becomes febrile (taking into account that Coumadin may need to be adjusted if necessary). Continue guaifenesin. Will transition off IV diltiazem and back to home regimen. Will discuss possible need for further diuresis with MD.  Code status: Long discussion with patient and family. He wishes to remain full code at this time.  Signed, Lisbeth Renshaw Rhapsody Wolven PA-C 11/27/2014, 3:03 PM

## 2014-11-27 NOTE — ED Notes (Signed)
Cardiology at bedside.

## 2014-11-27 NOTE — ED Provider Notes (Signed)
CSN: [id number]     Arrival date & time 11/27/14  1040 History   First MD Initiated Contact with Patient 11/27/14 1048     Chief Complaint  Patient presents with  . Chest Pain     (Consider location/radiation/quality/duration/timing/severity/associated sxs/prior Treatment) Patient is a 73 y.o. male presenting with chest pain.  Chest Pain Pain location:  L chest Pain quality: pressure and stabbing   Pain radiates to:  L arm Pain radiates to the back: no   Pain severity:  Moderate Onset quality:  Unable to specify Timing:  Intermittent Progression:  Waxing and waning Chronicity:  Chronic Context: breathing   Relieved by:  Nitroglycerin Worsened by:  Exertion Ineffective treatments:  Nitroglycerin    Primary Physician: Thayer Headings, MD Primary Cardiologist: [facility] [place]  Patient to the ED with complaints of chest pain that radiates into his left shoulder. 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   He was recommended to try Mucinex yesterday due to his coughing and "crackling" when breathing. He has not had orthopnea or increased wait/lower extremity swelling. He has taken 4 nitro prior to arrival and they did offer some relief.    Past Medical History  Diagnosis Date  . Pleuritis     h/o------ with negative vats biopsy  . Acute myocardial infarction, unspecified site, episode of care unspecified   . Hypertension   . Coronary artery disease     a. CABG 1998. b. NSTEMI 10/2014: patent LIMA-LAD and SVG to OM, TO SVG-PDA with TO PDA and 60% proximal RCA, some left =>PDA collaterals; culprit was likely occlusion of SVG-PDA though this may have been subacute given some collaterals.  . Diabetes mellitus   . 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 Chronic anticoagulation, on coumadin 01/12/2013  . Cardiomyopathy,  ischemic EF 25%   . Hypothyroidism   . GERD (gastroesophageal reflux disease)   . Arthritis     all over  . Chronic atrial fibrillation   . Digitalis toxicity   . Chronic systolic CHF (congestive heart failure)     a. EF prev 25%, improved to 45-50% in 2014. b. 10/2014: EF back downt o 25%.  [name] Hyperlipidemia   . LBBB (left bundle branch block)   . CKD (chronic kidney disease), stage III   . Rheumatoid arthritis   . Long term current use of systemic steroids     a. For RA.  [name] Spinal stenosis   . Hip pain     a. adductor muscle tears on prior MRI.  [name] Chronic chest pain     a. Intolerant of Ranexa.  . Hyperkalemia     a. Spironolactone decreased due to this.  . Elevated hemidiaphragm   . Moderate mitral regurgitation 10/2014 echo   Past Surgical History  Procedure Laterality Date  . Bypass graft    . Lung surgery    . Knee surgery    . Carpal tunnel release    . Cataract extraction    . Coronary artery bypass graft    . Cardiac catheterization Left 01/11/2013    Severe CAD with notable progression of disease in the SVG-RPDA followed by severe stenosis in grafted vessel, region not safely "reachede" for PCI, moderate ischemic CM no notable chage in EF, continue medical therapy  . Cardiac catheterization  04/03/2011    Left circumflex 95% stenosis,  essentially unchanged from last cath  . Cardiac catheterization  04/01/2010    LAD 99% proximal stenosis with vague flow, circumflex 80% stenosis, essentially unchanged form last cath  . Cardiac catheterization  03/28/2010    Mild pulmonary hypertension, medical therapy  . Cardiac catheterization  04/30/2009    LAD 95% stenosis, essentially unchanged fomr last cath, medical therapy  . Cardiac catheterization  12/05/2008    2.5x12 apex, predilation in the distal PLA branch, 3x23 XIENCE V durg eluting stent and post dilated with a 3.25x20 [place] VOYAGER at 15 atmospheres  . Left heart catheterization with coronary/graft angiogram N/A 01/13/2013     Procedure: LEFT HEART CATHETERIZATION WITH Isabel Caprice;  Surgeon: Marykay Lex, MD;  Location: [facility] CATH LAB;  Service: Cardiovascular;  Laterality: N/A;  . Left and right heart catheterization with coronary/graft angiogram N/A 10/16/2014    Procedure: LEFT AND RIGHT HEART CATHETERIZATION WITH Isabel Caprice;  Surgeon: Marykay Lex, MD;  Location: [facility] CATH LAB;  Service: Cardiovascular;  Laterality: N/A;   Family History  Problem Relation Age of Onset  . Heart disease Mother   . Heart disease Father   . Heart disease Brother   . Heart disease Maternal Aunt   . Cancer     History  Substance Use Topics  . Smoking status: Never Smoker   . Smokeless tobacco: Never Used  . Alcohol Use: No    Review of Systems  Cardiovascular: Positive for chest pain.  10 Systems reviewed and are negative for acute change except as noted in the HPI.    Allergies  Butrans; Clarithromycin; Influenza vaccines; Lanoxin; Lisinopril; Ranexa; and Statins  Home Medications   Prior to Admission medications   Medication Sig Start Date End Date Taking? Authorizing Provider  acetaminophen (TYLENOL) 650 MG CR tablet Take 650 mg by mouth every 8 (eight) hours as needed. For pain    Historical Provider, MD  aspirin EC 81 MG tablet Take 81 mg by mouth daily.    Historical Provider, MD  calcium carbonate (OS-CAL) 600 MG TABS Take 600 mg by mouth daily.    Historical Provider, MD  fluticasone (FLONASE) 50 MCG/ACT nasal spray Place 2 sprays into both nostrils daily.     Historical Provider, MD  hydrALAZINE (APRESOLINE) 25 MG tablet Take 1 tablet (25 mg total) by mouth every 8 (eight) hours. 10/23/14   Russella Dar, NP  HYDROcodone-acetaminophen (NORCO/VICODIN) 5-325 MG per tablet Take 1-2 tablets by mouth every 4 (four) hours as needed for moderate pain. 10/23/14   Russella Dar, NP  Insulin Glargine (LANTUS SOLOSTAR) 100 UNIT/ML Solostar Pen Inject 37 Units into the skin daily. 10/23/14    Russella Dar, NP  isosorbide mononitrate (IMDUR) 60 MG 24 hr tablet Take 1 tablet (60 mg total) by mouth daily. 11/16/14   Runell Gess, MD  levothyroxine (SYNTHROID, LEVOTHROID) 150 MCG tablet Take 175 mcg by mouth daily.     Historical Provider, MD  metoprolol succinate (TOPROL-XL) 12.5 mg TB24 24 hr tablet Take 1.5 tablets (37.5 mg total) by mouth 2 (two) times daily. 10/23/14   Russella Dar, NP  nitroGLYCERIN (NITROSTAT) 0.4 MG SL tablet Place 1 tablet (0.4 mg total) under the tongue every 5 (five) minutes as needed for chest pain. 11/30/13   Runell Gess, MD  nystatin cream (MYCOSTATIN) Apply as needed 12/12/13   Historical Provider, MD  Omega-3 Fatty Acids (FISH OIL) 1000 MG CAPS Take 3,000 mg by mouth every morning.  Historical Provider, MD  omeprazole (PRILOSEC) 20 MG capsule Take 20 mg by mouth daily.    Historical Provider, MD  predniSONE (DELTASONE) 10 MG tablet Take 10 mg by mouth daily with breakfast.    Historical Provider, MD  rosuvastatin (CRESTOR) 20 MG tablet Take 1 tablet (20 mg total) by mouth daily. 10/23/14   Russella Dar, NP  saxagliptin HCl (ONGLYZA) 5 MG TABS tablet Take 5 mg by mouth daily.    Historical Provider, MD  torsemide (DEMADEX) 20 MG tablet Take 40mg  (2 tabs) one day, 20mg  (1 tab) next day, and alternate. 11/13/14   Laurey Morale, MD  triamcinolone cream (KENALOG) 0.1 % Apply 1 application topically daily.  02/20/13   Historical Provider, MD  warfarin (COUMADIN) 5 MG tablet Take 1-2 tablets by mouth daily as directed Patient taking differently: Take 7.5-10 mg by mouth daily at 6 PM. 7.5 mg daily except 10mg  Monday and Friday 10/10/14   Phillips Hay, RPH-CPP   BP 103/68 mmHg  Pulse 63  Temp(Src) 97.5 F (36.4 C) (Oral)  Resp 25  Ht 6' (1.829 m)  Wt 210 lb (95.255 kg)  BMI 28.47 kg/m2  SpO2 96% Physical Exam  Constitutional: He appears well-developed and well-nourished. No distress.  HENT:  Head: Normocephalic and atraumatic.   Eyes: Pupils are equal, round, and reactive to light.  Neck: Normal range of motion. Neck supple.  Cardiovascular: Normal rate and regular rhythm.   Pulmonary/Chest: Effort normal. He has no decreased breath sounds. He has rhonchi (crackles diffuse). He exhibits no tenderness.  + increased effort of breathing  Abdominal: Soft. Bowel sounds are normal. There is no tenderness. There is no rebound and no guarding.  Neurological: He is alert.  Skin: Skin is warm and dry.  Nursing note and vitals reviewed.   ED Course  Procedures (including critical care time) Labs Review Labs Reviewed  BASIC METABOLIC PANEL - Abnormal; Notable for the following:    Sodium 132 (*)    Chloride 95 (*)    Glucose, Bld 270 (*)    BUN 33 (*)    Creatinine, Ser 1.42 (*)    GFR calc non Af Amer 47 (*)    GFR calc Af Amer 54 (*)    Anion gap 18 (*)    All other components within normal limits  CBC - Abnormal; Notable for the following:    WBC 17.5 (*)    RBC 4.15 (*)    RDW 16.7 (*)    All other components within normal limits  PRO B NATRIURETIC PEPTIDE - Abnormal; Notable for the following:    Pro B Natriuretic peptide (BNP) 6703.0 (*)    All other components within normal limits  PROTIME-INR  I-STAT TROPOININ, ED  I-STAT TROPOININ, ED  CBG MONITORING, ED    Imaging Review Dg Chest Port 1 View  11/27/2014   CLINICAL DATA:  Cough for 4-5 days, left-sided chest pain  EXAM: PORTABLE CHEST - 1 VIEW  COMPARISON:  10/22/2014  FINDINGS: Cardiac shadow is within normal limits. Postsurgical changes are again seen. The lungs are well aerated. Mild scarring is noted in the left lung base. No acute bony abnormality is noted.  IMPRESSION: No acute abnormality noted.   Electronically Signed   By: Alcide Clever M.D.   On: 11/27/2014 11:37     EKG Interpretation   Date/Time:  Tuesday November 27 2014 10:45:15 EST Ventricular Rate:  133 PR Interval:    QRS Duration: 136 QT Interval:  336 QTC Calculation:  500 R Axis:   -36 Text Interpretation:  Atrial fibrillation with rapid ventricular response  with premature ventricular or aberrantly conducted complexes Left axis  deviation Non-specific intra-ventricular conduction block Cannot rule out  Septal infarct , age undetermined T wave abnormality, consider lateral  ischemia Abnormal ECG SINCE LAST TRACING HEART RATE HAS INCREASED qt  changes have resolved Confirmed by Mirian Mo [id number]) on 11/27/2014  11:38:17 AM      MDM   Final diagnoses:  Chest pain, unspecified chest pain type  Atrial fibrillation with RVR    Medications  0.9 %  sodium chloride infusion ( Intravenous New Bag/Given 11/27/14 1151)  diltiazem (CARDIZEM) 1 mg/mL load via infusion 10 mg (0 mg Intravenous Stopped 11/27/14 1418)    And  diltiazem (CARDIZEM) 100 mg in dextrose 5 % 100 mL (1 mg/mL) infusion (5 mg/hr Intravenous New Bag/Given 11/27/14 1410)  albuterol (PROVENTIL) (2.5 MG/3ML) 0.083% nebulizer solution 5 mg (5 mg Nebulization Given 11/27/14 1148)  ipratropium (ATROVENT) nebulizer solution 0.5 mg (0.5 mg Nebulization Given 11/27/14 1148)  aspirin chewable tablet 324 mg (324 mg Oral Given 11/27/14 1449)    Patient is feeling better after the breathing treatment. He has had a negative Troponin x 1, delta pending, pt/inr pending. Patient has a.fib with RVR, it was unclear if it was true uncontrolled rate if it was related to pulmonary infection.   Patient started on Diltiazem drip and cardiology consulted.- he is currently awaiting consult for further recommendations. Pulse has improved to 63. Cards to admit.  @ 1445: pulse is [address], PA-C 11/27/14 1526  Dorthula Matas, PA-C 11/27/14 1610  Mirian Mo, MD 11/28/14 [phone]

## 2014-11-28 DIAGNOSIS — R0789 Other chest pain: Secondary | ICD-10-CM | POA: Diagnosis present

## 2014-11-28 DIAGNOSIS — J984 Other disorders of lung: Secondary | ICD-10-CM | POA: Diagnosis not present

## 2014-11-28 DIAGNOSIS — I4891 Unspecified atrial fibrillation: Secondary | ICD-10-CM | POA: Diagnosis not present

## 2014-11-28 DIAGNOSIS — I2510 Atherosclerotic heart disease of native coronary artery without angina pectoris: Secondary | ICD-10-CM | POA: Diagnosis not present

## 2014-11-28 DIAGNOSIS — I25708 Atherosclerosis of coronary artery bypass graft(s), unspecified, with other forms of angina pectoris: Secondary | ICD-10-CM

## 2014-11-28 DIAGNOSIS — R079 Chest pain, unspecified: Secondary | ICD-10-CM | POA: Diagnosis not present

## 2014-11-28 LAB — BASIC METABOLIC PANEL
ANION GAP: 16 — AB (ref 5–15)
BUN: 42 mg/dL — ABNORMAL HIGH (ref 6–23)
CO2: 21 meq/L (ref 19–32)
Calcium: 9.6 mg/dL (ref 8.4–10.5)
Chloride: 96 mEq/L (ref 96–112)
Creatinine, Ser: 1.48 mg/dL — ABNORMAL HIGH (ref 0.50–1.35)
GFR calc Af Amer: 52 mL/min — ABNORMAL LOW (ref >90)
GFR calc non Af Amer: 45 mL/min — ABNORMAL LOW (ref >90)
Glucose, Bld: 210 mg/dL — ABNORMAL HIGH (ref 70–99)
POTASSIUM: 5.1 meq/L (ref 3.7–5.3)
SODIUM: 133 meq/L — AB (ref 137–147)

## 2014-11-28 LAB — GLUCOSE, CAPILLARY
GLUCOSE-CAPILLARY: 168 mg/dL — AB (ref 70–99)
GLUCOSE-CAPILLARY: 225 mg/dL — AB (ref 70–99)
GLUCOSE-CAPILLARY: 273 mg/dL — AB (ref 70–99)
Glucose-Capillary: 209 mg/dL — ABNORMAL HIGH (ref 70–99)

## 2014-11-28 LAB — CBC
HCT: 37.9 % — ABNORMAL LOW (ref 39.0–52.0)
Hemoglobin: 12.7 g/dL — ABNORMAL LOW (ref 13.0–17.0)
MCH: 32.6 pg (ref 26.0–34.0)
MCHC: 33.5 g/dL (ref 30.0–36.0)
MCV: 97.4 fL (ref 78.0–100.0)
PLATELETS: 164 10*3/uL (ref 150–400)
RBC: 3.89 MIL/uL — AB (ref 4.22–5.81)
RDW: 17.2 % — ABNORMAL HIGH (ref 11.5–15.5)
WBC: 12.5 10*3/uL — AB (ref 4.0–10.5)

## 2014-11-28 LAB — PROTIME-INR
INR: 2.4 — ABNORMAL HIGH (ref 0.00–1.49)
PROTHROMBIN TIME: 26.3 s — AB (ref 11.6–15.2)

## 2014-11-28 LAB — TROPONIN I: Troponin I: <0.3 ng/mL (ref <0.30)

## 2014-11-28 MED ORDER — METOPROLOL TARTRATE 1 MG/ML IV SOLN
5.0000 mg | Freq: Four times a day (QID) | INTRAVENOUS | Status: DC | PRN
  Administered 2014-11-28: 5 mg via INTRAVENOUS
  Filled 2014-11-28 (×2): qty 5

## 2014-11-28 MED ORDER — LEVALBUTEROL HCL 0.63 MG/3ML IN NEBU
0.6300 mg | INHALATION_SOLUTION | Freq: Four times a day (QID) | RESPIRATORY_TRACT | Status: DC
  Administered 2014-11-28 – 2014-11-29 (×7): 0.63 mg via RESPIRATORY_TRACT
  Filled 2014-11-28 (×8): qty 3

## 2014-11-28 MED ORDER — METOPROLOL SUCCINATE ER 25 MG PO TB24
37.5000 mg | ORAL_TABLET | Freq: Three times a day (TID) | ORAL | Status: DC
  Administered 2014-11-28 – 2014-11-29 (×3): 37.5 mg via ORAL
  Filled 2014-11-28 (×5): qty 2

## 2014-11-28 MED ORDER — TORSEMIDE 20 MG PO TABS
40.0000 mg | ORAL_TABLET | Freq: Every day | ORAL | Status: DC
  Administered 2014-11-29 – 2014-11-30 (×2): 40 mg via ORAL
  Filled 2014-11-28 (×2): qty 2

## 2014-11-28 MED ORDER — FUROSEMIDE 10 MG/ML IJ SOLN
80.0000 mg | Freq: Once | INTRAMUSCULAR | Status: AC
  Administered 2014-11-28: 80 mg via INTRAVENOUS
  Filled 2014-11-28: qty 8

## 2014-11-28 NOTE — Progress Notes (Signed)
CRITICAL VALUE ALERT  Critical value received:  Troponin  Date of notification:  Time of notification:    Critical value read back:  Nurse who received alert:  Adele, day shift RN- see previous note  MD notified (1st page):    Time of first page:    MD notified (2nd page):  Time of second page:  Responding MD:  Dr. Adolm Joseph- orders received to make pt NPO after midnight  Time MD responded:  2018

## 2014-11-28 NOTE — Progress Notes (Signed)
Patient Name: [name] Date of Encounter: 11/28/2014   Active Problems:   Atrial fibrillation, permanent   Cardiomyopathy, ischemic EF 25% at one time most recent cath EF 40-45% 01/2013   Chronic systolic HF (heart failure)   Atrial fibrillation with RVR   Cough   Essential hypertension   CAD (coronary artery disease) of artery bypass graft   Diabetes mellitus with ophthalmic manifestations   CKD (chronic kidney disease), stage III   Spinal stenosis   LBBB (left bundle branch block)   Paralyzed hemidiaphragm   Rheumatoid arthritis   Midsternal chest pain    SUBJECTIVE  No further chest pain.  Continues to have "rattling" cough.  WBC down from yesterday.  Afebrile.  Trops neg.  Rate reasonable.  CURRENT MEDS . aspirin EC  81 mg Oral Daily  . calcium carbonate  1,250 mg Oral QHS  . fluticasone  2 spray Each Nare Daily  . guaiFENesin  600 mg Oral BID  . hydrALAZINE  25 mg Oral 3 times per day  . insulin aspart  0-9 Units Subcutaneous TID WC  . insulin glargine  25 Units Subcutaneous q morning - 10a  . isosorbide mononitrate  60 mg Oral Daily  . levalbuterol  0.63 mg Nebulization QID  . levothyroxine  175 mcg Oral QAC breakfast  . linagliptin  5 mg Oral Daily  . metoprolol succinate  37.5 mg Oral BID  . omega-3 acid ethyl esters  1 g Oral Daily  . pantoprazole  40 mg Oral Daily  . predniSONE  5 mg Oral BID WC  . rosuvastatin  20 mg Oral q morning - 10a  . [START ON 11/29/2014] torsemide  20 mg Oral QODAY   And  . torsemide  40 mg Oral QODAY  . [START ON 12/03/2014] warfarin  10 mg Oral Q Mon-1800  . warfarin  7.5 mg Oral Once per day on Sun Tue Wed Thu Fri Sat  . Warfarin - Pharmacist Dosing Inpatient   Does not apply q1800    OBJECTIVE  Filed Vitals:   11/28/14 0507 11/28/14 0514 11/28/14 0616 11/28/14 0815  BP: 125/86 121/86 112/88 105/85  Pulse:    112  Temp:      TempSrc:      Resp:    24  Height:      Weight:      SpO2:    96%     Intake/Output Summary (Last 24 hours) at 11/28/14 1020 Last data filed at 11/28/14 0947  Gross per 24 hour  Intake    240 ml  Output   2125 ml  Net  -1885 ml   Filed Weights   11/27/14 1046 11/27/14 1828 11/28/14 0431  Weight: 210 lb (95.255 kg) 207 lb (93.895 kg) 210 lb (95.255 kg)    PHYSICAL EXAM  General: Pleasant, NAD. Neuro: Alert and oriented X 3. Moves all extremities spontaneously. Psych: Normal affect. HEENT:  Normal  Neck: Supple without bruits or JVD. Lungs:  Resp regular and unlabored, scattered rhonchi throughout - left basilar crackles. Heart: IR, IR no s3, s4, or murmurs. Abdomen: Soft, non-tender, non-distended, BS + x 4.  Extremities: No clubbing, cyanosis or edema. DP/PT/Radials 2+ and equal bilaterally.  Accessory Clinical Findings  CBC  Recent Labs  11/27/14 1049 11/28/14 0532  WBC 17.5* 12.5*  HGB 13.2 12.7*  HCT 40.2 37.9*  MCV 96.9 97.4  PLT 182 164   Basic Metabolic Panel  Recent Labs  11/27/14 1049 11/28/14 0532  NA 132* 133*  K 5.3 5.1  CL 95* 96  CO2 19 21  GLUCOSE 270* 210*  BUN 33* 42*  CREATININE 1.42* 1.48*  CALCIUM 9.9 9.6   Cardiac Enzymes  Recent Labs  11/27/14 1910 11/28/14 0001 11/28/14 0532  TROPONINI 0.33* <0.30 <0.30   Lab Results  Component Value Date   INR 2.60* 11/27/2014   INR 2 11/26/2014   INR 2.6 11/19/2014    TELE  Afib, mostly 80's to low 100's.  ECG  Afib, 134, lbbb  Radiology/Studies  Dg Chest Port 1 View  11/27/2014   CLINICAL DATA:  Cough for 4-5 days, left-sided chest pain  EXAM: PORTABLE CHEST - 1 VIEW  COMPARISON:  10/22/2014  FINDINGS: Cardiac shadow is within normal limits. Postsurgical changes are again seen. The lungs are well aerated. Mild scarring is noted in the left lung base. No acute bony abnormality is noted.  IMPRESSION: No acute abnormality noted.   Electronically Signed   By: Alcide Clever M.D.   On: 11/27/2014 11:37    ASSESSMENT AND PLAN  1.  Midsternal  chest pain:  Presented yesterday following a several day h/o intermittent, seemingly nitrate responsive chest pain that was worse with cough/deep breathing.  Yesterday, Ss lasted several hrs prior to presentation.  Despite prolonged pain yesterday, CE neg.  ECG non-acute in setting of underlying LBBB.  HR was up on admission - now somewhat improved.  Last cath in November @ time of NSTEMI showed patent LIMA-LAD and SVG to OM, 100 SVG-PDA with 100 native PDA and 60% proximal RCA, some left =>right collaterals;culprit felt to be VG->PDA. There was no good target for intervention and imdur was increased.  No further chest pain.  No objective evidence of ischemia. Cont medical Rx - asa, statin, nitrate, bb.  Is not on plavix despite recent ACS 2/2 chronic coumadin anticoagulation.  With leukocytosis and cough, suspect viral bronchitis may be playing a role in c/p Ss.  He thinks he started feeling better after receiving albuterol in ED.  Will schedule xopenex nebs qid.  2.  Acute bronchitis:  As above, has been coughing with pleuritic c/p.  Cont mucinex.  Schedule nebs qid.  WBC coming down.  Afebrile.  Will not use abx.  3.  Chronic systolic CHF/ICM:  Volume appears to be stable, though BNP up on admission.  Home torsemide dose titrated to 40mg  daily on admission.  Follow creat - up slightly this AM. Cont bb/hydral/nitrate.  No acei/arb/spiro/arni 2/2 CKD III.  4.  Persistent atrial fibrillation:  Rates reasonable.  Cont bb.  Not a candidate for dilt 2/2 ICM with EF of 25% in November.  Could consider adding renally adjusted digoxin for improved rate and output.  Cont coumadin - INR 2.6.  5.  CKD III:  Stable.  Follow on higher dose of torsemide.  6.  Dispo:  Ambulate today.  Possible d/c in AM.  Signed, December NP Patient seen and examined and history reviewed. Agree with above findings and plan. Patient complains of nonproductive cough. Improved when he gets nebulizer Rx. Afib rate is poorly  controlled especially with any activity. BP is soft. He is intolerant to digoxin and states this almost killed him last month. Will increase metoprolol to tid and observe. Lungs reveal prominent rhonchi. Will continue treatment for bronchitis with Nebs and Mucinex. His chest pain is nonischemic.  Ozetta Flatley Nicolasa Ducking, MDFACC 11/28/2014 10:57 AM

## 2014-11-28 NOTE — Progress Notes (Signed)
ANTICOAGULATION CONSULT NOTE   Pharmacy Consult for Coumadin Indication: atrial fibrillation  Allergies  Allergen Reactions  . Butrans [Buprenorphine] Shortness Of Breath  . Influenza Vaccines Other (See Comments)    Severe coughing for weeks after vaccine; also had flu like symptoms for about 7 to 8 weeks.   . Lanoxin [Digoxin] Other (See Comments)    Nausea, anorexia, caused tremendous weight loss  . Ranexa [Ranolazine Er] Other (See Comments)    TOLD NEVER TO TAKE MED-PER MD  . Lisinopril Other (See Comments)    cough  . Clarithromycin Other (See Comments)    Unknown, thinks "blisters in mouth"  . Statins Other (See Comments)    unknown    Patient Measurements: Height: 6' (182.9 cm) Weight: 210 lb (95.255 kg) IBW/kg (Calculated) : 77.6  Vital Signs: Temp: 97.8 F (36.6 C) (12/16 0431) Temp Source: Oral (12/16 0431) BP: 138/90 mmHg (12/16 1032) Pulse Rate: 101 (12/16 1032)  Labs:  Recent Labs  11/26/14 11/27/14 1049 11/27/14 1506 11/27/14 1910 11/28/14 0001 11/28/14 0532 11/28/14 1047  HGB  --  13.2  --   --   --  12.7*  --   HCT  --  40.2  --   --   --  37.9*  --   PLT  --  182  --   --   --  164  --   LABPROT  --   --  28.0*  --   --   --  26.3*  INR 2  --  2.60*  --   --   --  2.40*  CREATININE  --  1.42*  --   --   --  1.48*  --   TROPONINI  --   --   --  0.33* <0.30 <0.30  --     Estimated Creatinine Clearance: 51.7 mL/min (by C-G formula based on Cr of 1.48).   Assessment: 73 y.o. male presents with CP, cough. Pt on coumadin PTA for afib. Home dose: 10mg  on Mondays and 7.5mg  all other days. Admit INR 2.6 with CBC stable. INR this morning remains in range with resumption of patient's home warfarin dose. No bleeding noted.  Goal of Therapy:  INR 2-3 Monitor platelets by anticoagulation protocol: Yes   Plan:  1. Continue home coumadin 7.5mg  daily except for 10mg  on Mondays 2. Will change INR checks to 3x weekly on MWF with stable INR on home  dose 3. Will follow for s/s bleeding and any changes to medications that could affect INR  Kamilo Och D. Daquarius Dubeau, PharmD, BCPS Clinical Pharmacist Pager: [phone] 11/28/2014 12:52 PM

## 2014-11-28 NOTE — Progress Notes (Addendum)
Paged Ward Givens, NP as HR continues 120-130s in atrial fibrillation after IV metoprolol dose at 1300. Also notified of chest pain now in left chest with pain radiating down left arm.

## 2014-11-28 NOTE — Progress Notes (Signed)
Pharmacist Heart Failure Core Measure Documentation  Assessment: [name] has an EF documented as 25% on 10/15/2014 by Dr. [name].  Rationale: Heart failure patients with left ventricular systolic dysfunction (LVSD) and an EF < 40% should be prescribed an angiotensin converting enzyme inhibitor (ACEI) or angiotensin receptor blocker (ARB) at discharge unless a contraindication is documented in the medical record.  This patient is not currently on an ACEI or ARB for HF.  This note is being placed in the record in order to provide documentation that a contraindication to the use of these agents is present for this encounter.  ACE Inhibitor or Angiotensin Receptor Blocker is contraindicated (specify all that apply)  []   ACEI allergy AND ARB allergy []   Angioedema []   Moderate or severe aortic stenosis []   Hyperkalemia []   Hypotension []   Renal artery stenosis [x]   Worsening renal function, preexisting renal disease or dysfunction   [name] 11/28/2014 12:59 PM

## 2014-11-28 NOTE — Progress Notes (Addendum)
Ward Givens, NP notified of HR in 130-140s in atrial fibrillation. Scheduled PO metoprolol administered at 1030. Orders to call respiratory to administer xopenex STAT. Respiratory notified. Medication orders pending NP consult with MD.

## 2014-11-28 NOTE — Progress Notes (Addendum)
Ward Givens, NP notified of chest pain. Nitroglycerin to be administered. Also notified of small runs of ventricular tachycardia. No new orders.

## 2014-11-28 NOTE — Progress Notes (Signed)
UR completed 

## 2014-11-28 NOTE — Progress Notes (Signed)
Paged Ward Givens, NP again r/t HR in 130s. NP called back and states he will round on patient. No new orders at this time.

## 2014-11-28 NOTE — Progress Notes (Signed)
    S:  Pt has had intermittent chest pain and dyspnea this AM with varied heart rate.  Currently chest pain free.  HR's ~110 or less. O:   Filed Vitals:   11/28/14 1346  BP: 110/91  Pulse:   Temp:   Resp: 24   Pleasant, NAD.  AAOx3.  Neck supple - jvp ~ 12 cm. Lungs coarse breath sounds with bibasilar crackles - more pronounced than this AM.  Cor Irreg, irreg. Abd soft, nt/nd/bs+x4. Ext No CCE.  A/P:  1.  Acute on chronic systolic CHF/ICM: In setting of afib with rates as high as the 140's, pt has been having intermittent chest pain and dyspnea, though he is currently stable.  He does have evidence of volume load at present, which was not there this AM.  His home torsemide dose was supposed to be increased to 40 mg daily on admission.  Continue this and I will give a dose of IV lasix now.  Hopefully by optimizing his volume status, his HR will stabilize.  2.  CAD with intermittent chest pain:  Known small vessel disease s/p NSTEMI last month with occluded VG PDA and native PDA.  He has been medically managed.  He develops c/p in the setting of fast heart rates.  Suspect demand ischemia, though troponins have been normal.  Diurese as above and work on HR.  3.  Persistent afib:  Rates variable.  Metoprolol dose adjusted and rates currently trending lower (low 100's).  If rates remain difficult to control, we may need to consider amio, even if just in the short term, to allow for rate control while he recovers from resp illness.  He had been on digoxin before but became dig toxic in the setting of CKD and it was d/c'd.  Could hypothetically be resumed if close dig level monitoring was undertaken.  Nicolasa Ducking, NP

## 2014-11-28 NOTE — Progress Notes (Signed)
Documentation: CHADSVASC=6. Inza Mikrut PA-C

## 2014-11-29 DIAGNOSIS — I25700 Atherosclerosis of coronary artery bypass graft(s), unspecified, with unstable angina pectoris: Secondary | ICD-10-CM

## 2014-11-29 DIAGNOSIS — J209 Acute bronchitis, unspecified: Secondary | ICD-10-CM

## 2014-11-29 LAB — GLUCOSE, CAPILLARY
GLUCOSE-CAPILLARY: 230 mg/dL — AB (ref 70–99)
Glucose-Capillary: 210 mg/dL — ABNORMAL HIGH (ref 70–99)
Glucose-Capillary: 216 mg/dL — ABNORMAL HIGH (ref 70–99)
Glucose-Capillary: 268 mg/dL — ABNORMAL HIGH (ref 70–99)

## 2014-11-29 MED ORDER — SENNA 8.6 MG PO TABS
1.0000 | ORAL_TABLET | Freq: Every day | ORAL | Status: DC
  Administered 2014-11-29: 8.6 mg via ORAL
  Filled 2014-11-29: qty 1

## 2014-11-29 MED ORDER — POLYETHYLENE GLYCOL 3350 17 G PO PACK
17.0000 g | PACK | Freq: Every day | ORAL | Status: DC
  Administered 2014-11-29: 17 g via ORAL
  Filled 2014-11-29: qty 1

## 2014-11-29 MED ORDER — INSULIN GLARGINE 100 UNIT/ML ~~LOC~~ SOLN
30.0000 [IU] | Freq: Every morning | SUBCUTANEOUS | Status: DC
  Administered 2014-11-29 – 2014-11-30 (×2): 30 [IU] via SUBCUTANEOUS
  Filled 2014-11-29 (×2): qty 0.3

## 2014-11-29 MED ORDER — METOPROLOL SUCCINATE ER 50 MG PO TB24
50.0000 mg | ORAL_TABLET | Freq: Three times a day (TID) | ORAL | Status: DC
  Administered 2014-11-29 – 2014-11-30 (×3): 50 mg via ORAL
  Filled 2014-11-29 (×3): qty 1

## 2014-11-29 MED ORDER — DOXYCYCLINE HYCLATE 100 MG PO TABS
100.0000 mg | ORAL_TABLET | Freq: Two times a day (BID) | ORAL | Status: DC
  Administered 2014-11-29 – 2014-11-30 (×3): 100 mg via ORAL
  Filled 2014-11-29 (×4): qty 1

## 2014-11-29 NOTE — Progress Notes (Signed)
UR completed 

## 2014-11-29 NOTE — Progress Notes (Signed)
Inpatient Diabetes Program Recommendations  AACE/ADA: New Consensus Statement on Inpatient Glycemic Control (2013)  Target Ranges:  Prepandial:   less than 140 mg/dL      Peak postprandial:   less than 180 mg/dL (1-2 hours)      Critically ill patients:  140 - 180 mg/dL     Results for [name], [name] (MRN [id number]) as of 11/29/2014 12:00  Ref. Range 11/28/2014 07:33 11/28/2014 11:49 11/28/2014 16:35 11/28/2014 19:59  Glucose-Capillary Latest Range: 70-99 mg/dL 076 (H) 226 (H) 333 (H) 273 (H)    Results for [name], [name] (MRN [id number]) as of 11/29/2014 12:00  Ref. Range 11/29/2014 08:00 11/29/2014 11:33  Glucose-Capillary Latest Range: 70-99 mg/dL 937 (H) 342 (H)     Current Insulin Orders: Lantus 30 units QAM      Novolog Sensitive SSI          Tradjenta 5 mg daily   **Note Lantus increased to 30 units daily today.  **Patient still having elevated postprandial glucose levels.  Eating 100% of meals.  **Getting Prednisone 5 mg bid.    MD- Please consider adding Novolog Meal Coverage-   Novolog 4 units tid with meals   Will follow Ambrose Finland RN, MSN, CDE Diabetes Coordinator Inpatient Diabetes Program Team Pager: [phone] (8a-10p)

## 2014-11-29 NOTE — Progress Notes (Signed)
Patient Name: [name] Date of Encounter: 11/29/2014   Active Problems:   Essential hypertension   CAD (coronary artery disease) of artery bypass graft   Atrial fibrillation, permanent   Cardiomyopathy, ischemic EF 25% at one time most recent cath EF 40-45% 01/2013   Chronic systolic HF (heart failure)   Spinal stenosis   LBBB (left bundle branch block)   Atrial fibrillation with RVR   Paralyzed hemidiaphragm   Diabetes mellitus with ophthalmic manifestations   Rheumatoid arthritis   CKD (chronic kidney disease), stage III   Cough   Midsternal chest pain    SUBJECTIVE  Complains of chest pain after eating. Continues to have "rattling" cough.  Still SOB with any activity.Afebrile.  Trops neg.  No BM in over 3 days.  CURRENT MEDS . aspirin EC  81 mg Oral Daily  . calcium carbonate  1,250 mg Oral QHS  . doxycycline  100 mg Oral Q12H  . fluticasone  2 spray Each Nare Daily  . guaiFENesin  600 mg Oral BID  . hydrALAZINE  25 mg Oral 3 times per day  . insulin aspart  0-9 Units Subcutaneous TID WC  . insulin glargine  30 Units Subcutaneous q morning - 10a  . isosorbide mononitrate  60 mg Oral Daily  . levalbuterol  0.63 mg Nebulization QID  . levothyroxine  175 mcg Oral QAC breakfast  . linagliptin  5 mg Oral Daily  . metoprolol succinate  50 mg Oral TID  . omega-3 acid ethyl esters  1 g Oral Daily  . pantoprazole  40 mg Oral Daily  . polyethylene glycol  17 g Oral Daily  . predniSONE  5 mg Oral BID WC  . rosuvastatin  20 mg Oral q morning - 10a  . senna  1 tablet Oral Daily  . torsemide  40 mg Oral Daily  . [START ON 12/03/2014] warfarin  10 mg Oral Q Mon-1800  . warfarin  7.5 mg Oral Once per day on Sun Tue Wed Thu Fri Sat  . Warfarin - Pharmacist Dosing Inpatient   Does not apply q1800    OBJECTIVE  Filed Vitals:   11/29/14 0025 11/29/14 0427 11/29/14 0843 11/29/14 0854  BP: 117/79 108/83  107/64  Pulse: 85 82  112  Temp: 97.8 F (36.6 C) 97.5 F (36.4  C)  97.4 F (36.3 C)  TempSrc: Oral Oral  Oral  Resp: 16 16  18   Height:      Weight:  210 lb (95.255 kg)    SpO2: 96% 95% 97% 97%    Intake/Output Summary (Last 24 hours) at 11/29/14 0915 Last data filed at 11/29/14 0700  Gross per 24 hour  Intake    240 ml  Output   3700 ml  Net  -3460 ml   Filed Weights   11/27/14 1828 11/28/14 0431 11/29/14 0427  Weight: 207 lb (93.895 kg) 210 lb (95.255 kg) 210 lb (95.255 kg)    PHYSICAL EXAM  General: Pleasant, NAD. Neuro: Alert and oriented X 3. Moves all extremities spontaneously. Psych: Normal affect. HEENT:  Normal  Neck: Supple without bruits or JVD. Lungs:  Resp regular . Diffuse coarse rhonchi throughout - left basilar crackles. Heart: IR, IR no s3, s4, or murmurs. Abdomen: Soft, non-tender, non-distended, BS + x 4.  Extremities: No clubbing, cyanosis or edema. DP/PT/Radials 2+ and equal bilaterally.  Accessory Clinical Findings  CBC  Recent Labs  11/27/14 1049 11/28/14 0532  WBC 17.5* 12.5*  HGB 13.2  12.7*  HCT 40.2 37.9*  MCV 96.9 97.4  PLT 182 164   Basic Metabolic Panel  Recent Labs  11/27/14 1049 11/28/14 0532  NA 132* 133*  K 5.3 5.1  CL 95* 96  CO2 19 21  GLUCOSE 270* 210*  BUN 33* 42*  CREATININE 1.42* 1.48*  CALCIUM 9.9 9.6   Cardiac Enzymes  Recent Labs  11/27/14 1910 11/28/14 0001 11/28/14 0532  TROPONINI 0.33* <0.30 <0.30   Lab Results  Component Value Date   INR 2.40* 11/28/2014   INR 2.60* 11/27/2014   INR 2 11/26/2014    TELE  Afib, mostly 80's overnight. Increased HR 115 with activity.  ECG  Afib, 134, lbbb  Radiology/Studies  Dg Chest Port 1 View  11/27/2014   CLINICAL DATA:  Cough for 4-5 days, left-sided chest pain  EXAM: PORTABLE CHEST - 1 VIEW  COMPARISON:  10/22/2014  FINDINGS: Cardiac shadow is within normal limits. Postsurgical changes are again seen. The lungs are well aerated. Mild scarring is noted in the left lung base. No acute bony abnormality is  noted.  IMPRESSION: No acute abnormality noted.   Electronically Signed   By: Alcide Clever M.D.   On: 11/27/2014 11:37    ASSESSMENT AND PLAN  1.  Midsternal chest pain:  Presented  following a several day h/o intermittent, seemingly nitrate responsive chest pain that was worse with cough/deep breathing.  Yesterday, Ss lasted several hrs prior to presentation.  Despite prolonged pain yesterday, CE neg.  ECG non-acute in setting of underlying LBBB.  HR was up on admission. Last cath in November @ time of NSTEMI showed patent LIMA-LAD and SVG to OM, 100 SVG-PDA with 100 native PDA and 60% proximal RCA, some left =>right collaterals;culprit felt to be VG->PDA. There was no good target for intervention and imdur was increased.  Suspect chest pain related to elevated HR and bronchitis. Cont medical Rx - asa, statin, nitrate, bb.  Is not on plavix despite recent ACS 2/2 chronic coumadin anticoagulation.    2.  Acute bronchitis:  As above, has been coughing with pleuritic c/p.  Cont mucinex.  Schedule nebs qid- Xopenex.  WBC coming down.  Afebrile.  Will start doxycycline 100 mg bid. Patient intolerant of clarithromycin.  3.  Chronic systolic CHF/ICM:  Volume appears to be stable, though BNP up on admission.  Home torsemide dose titrated to 40mg  daily yesterday.  Received one dose of IV lasix yesterday PM with brisk diuresis.  Cont bb/hydral/nitrate.  No acei/arb/spiro/arni 2/2 CKD III.  4.  Persistent atrial fibrillation:  Rates difficult to control.  Driven by bronchitis. Fortunately BP improved today and we will titrate metoprolol to 50 mg tid.   Not a candidate for dilt 2/2 ICM with EF of 25% in November.  History of Dig toxicity in the past. Would like to avoid amiodarone due to underlying lung disease.  Cont coumadin - INR 2.4.  5.  CKD III:  Stable.  Follow on higher dose of torsemide.  6.  DM- poorly controlled. Will increase lantus to 30 units in the am. Continue SSI.   Signed,   Antonia Culbertson December,  MDFACC 11/29/2014 9:15 AM

## 2014-11-30 ENCOUNTER — Other Ambulatory Visit: Payer: Self-pay | Admitting: Physician Assistant

## 2014-11-30 DIAGNOSIS — J209 Acute bronchitis, unspecified: Secondary | ICD-10-CM

## 2014-11-30 DIAGNOSIS — I5022 Chronic systolic (congestive) heart failure: Secondary | ICD-10-CM

## 2014-11-30 LAB — PROTIME-INR
INR: 2.69 — ABNORMAL HIGH (ref 0.00–1.49)
Prothrombin Time: 28.8 seconds — ABNORMAL HIGH (ref 11.6–15.2)

## 2014-11-30 LAB — GLUCOSE, CAPILLARY
GLUCOSE-CAPILLARY: 118 mg/dL — AB (ref 70–99)
Glucose-Capillary: 147 mg/dL — ABNORMAL HIGH (ref 70–99)

## 2014-11-30 MED ORDER — TORSEMIDE 20 MG PO TABS: 40.0000 mg | ORAL_TABLET | Freq: Every day | ORAL | Status: DC

## 2014-11-30 MED ORDER — METOPROLOL SUCCINATE ER 50 MG PO TB24: 50.0000 mg | ORAL_TABLET | Freq: Three times a day (TID) | ORAL | Status: DC

## 2014-11-30 MED ORDER — DOXYCYCLINE HYCLATE 100 MG PO TABS: 100.0000 mg | ORAL_TABLET | Freq: Two times a day (BID) | ORAL | Status: DC

## 2014-11-30 MED ORDER — SENNA 8.6 MG PO TABS: 1.0000 | ORAL_TABLET | Freq: Every day | ORAL | Status: DC

## 2014-11-30 MED ORDER — LEVALBUTEROL HCL 0.63 MG/3ML IN NEBU: 0.6300 mg | INHALATION_SOLUTION | Freq: Four times a day (QID) | RESPIRATORY_TRACT | Status: DC | PRN

## 2014-11-30 MED ORDER — INSULIN GLARGINE 100 UNIT/ML SOLOSTAR PEN: 30.0000 [IU] | PEN_INJECTOR | Freq: Every day | SUBCUTANEOUS | Status: DC

## 2014-11-30 MED ORDER — LEVALBUTEROL TARTRATE 45 MCG/ACT IN AERO: 1.0000 | INHALATION_SPRAY | Freq: Three times a day (TID) | RESPIRATORY_TRACT | Status: DC | PRN

## 2014-11-30 MED ORDER — POLYETHYLENE GLYCOL 3350 17 G PO PACK: 17.0000 g | PACK | Freq: Every day | ORAL | Status: AC | PRN

## 2014-11-30 NOTE — Progress Notes (Signed)
ANTICOAGULATION CONSULT NOTE   Pharmacy Consult for Coumadin Indication: atrial fibrillation  Allergies  Allergen Reactions  . Butrans [Buprenorphine] Shortness Of Breath  . Influenza Vaccines Other (See Comments)    Severe coughing for weeks after vaccine; also had flu like symptoms for about 7 to 8 weeks.   . Lanoxin [Digoxin] Other (See Comments)    Nausea, anorexia, caused tremendous weight loss  . Ranexa [Ranolazine Er] Other (See Comments)    TOLD NEVER TO TAKE MED-PER MD  . Lisinopril Other (See Comments)    cough  . Clarithromycin Other (See Comments)    Unknown, thinks "blisters in mouth"  . Statins Other (See Comments)    unknown    Patient Measurements: Height: 6' (182.9 cm) Weight: 199 lb 6.4 oz (90.447 kg) IBW/kg (Calculated) : 77.6  Vital Signs: Temp: 97.5 F (36.4 C) (12/18 0625) Temp Source: Oral (12/18 0625) BP: 116/78 mmHg (12/18 0625) Pulse Rate: 88 (12/18 0625)  Labs:  Recent Labs  11/27/14 1049 11/27/14 1506 11/27/14 1910 11/28/14 0001 11/28/14 0532 11/28/14 1047 11/30/14 0400  HGB 13.2  --   --   --  12.7*  --   --   HCT 40.2  --   --   --  37.9*  --   --   PLT 182  --   --   --  164  --   --   LABPROT  --  28.0*  --   --   --  26.3* 28.8*  INR  --  2.60*  --   --   --  2.40* 2.69*  CREATININE 1.42*  --   --   --  1.48*  --   --   TROPONINI  --   --  0.33* <0.30 <0.30  --   --     Estimated Creatinine Clearance: 47.3 mL/min (by C-G formula based on Cr of 1.48).   Assessment: 84 YOM presents with CP and cough. Pt on coumadin PTA for afib. Home dose: 10mg  on Mondays and 7.5mg  all other days. INR had remained within goal range throughout hospitalization, this morning INR 2.69. No CBC since 12/16- no bleeding noted. Noted patient was started on doxycycline yesterday for acute bronchitis which can affect warfarin and increase INR.   Goal of Therapy:  INR 2-3 Monitor platelets by anticoagulation protocol: Yes   Plan:  1. Continue home  coumadin 7.5mg  daily except for 10mg  on Mondays 2. Will change INR checks back to daily with addition of doxycycline as this may require changes in dosing 3. Will follow for s/s bleeding  Tyler Robidoux D. Chrisha Vogel, PharmD, BCPS Clinical Pharmacist Pager: [phone] 11/30/2014 9:58 AM

## 2014-11-30 NOTE — Progress Notes (Signed)
[facility]  Patient Status: Active (receiving services up to time of hospitalization)  [facility] is providing the following services: RN and PT  If patient discharges after hours, please call [phone].   [name] 11/30/2014, 9:46 AM

## 2014-11-30 NOTE — Discharge Summary (Signed)
CARDIOLOGY DISCHARGE SUMMARY   Patient ID: OGDEN HANDLIN MRN: [id number] DOB/AGE: 1941/10/02 73 y.o.  Admit date: 11/27/2014 Discharge date: 11/30/2014  PCP: Thayer Headings, MD Primary Cardiologist: Dr. [name], Dr. [name]  Primary Discharge Diagnosis: Midsternal chest pain Secondary Discharge Diagnosis:    Bronchitis, acute   Essential hypertension   CAD (coronary artery disease) of artery bypass graft   Atrial fibrillation, permanent   Cardiomyopathy, ischemic EF 25% at one time most recent cath EF 40-45% 01/2013   Chronic systolic HF (heart failure) - weight 199 pounds at discharge   Spinal stenosis   LBBB (left bundle branch block)   Atrial fibrillation with RVR   Paralyzed hemidiaphragm   Diabetes mellitus with ophthalmic manifestations   Rheumatoid arthritis   CKD (chronic kidney disease), stage III  Procedures: portable CXR  Hospital Course: MATHAN DARROCH is a 73 y.o. male with a history of S-CHF, CAD, HTN, DM2, HLD, NSTEMI 10/2014 w/ med rx, chronic afib, CKD III, RA, who was admitted 12/15 w/ URI, chest pain, elevated HR.   He had a minimal elevation in his initial troponin, but subsequent troponins were negative. His pain had a pleuritic component. Cardiac catheterization results from November 2050 reviewed and medical therapy was felt the best option.  He was felt to have an upper respiratory infection. He was given Xopenex nebulizers while he was here and is to go home with his Xopenex inhaler. He was continued on Mucinex twice a day and started on doxycycline which he will continue as an outpatient as well. He has an appointment with Dr. Stann Mainland next week and is encouraged to keep this.  His heart rate was poorly controlled and that was felt possibly contributing to his chest pain. His beta blocker was increased for better heart rate control and he tolerated this well.  He had been on Coumadin prior to admission and his INR was therapeutic. He was  continued on Coumadin during his hospital stay and is to keep scheduled Coumadin clinic appointments. Because he is on Coumadin, he is to continue baby aspirin but is not on Plavix despite a recent non-STEMI.  His BNP was elevated on admission, and he was given one injection of IV Lasix. His volume status improved and his home torsemide dose was increased. His shortness of breath was otherwise felt secondary to upper respiratory infection and not heart failure. His volume status was stable at discharge, weight 199 pounds. Although he has ischemic cardiomyopathy with an EF of approximately 25%, he is not on an ACE inhibitor, ARB or spironolactone due to renal insufficiency.  He had been on Lantus prior to admission and this was continued. However, his blood sugars were not well controlled and his home dose of Lantus was increased. Compliance with medications and a diabetic diet is encouraged.  On 12/18, he was seen by Dr. [place] and all data were reviewed. His respiratory status was greatly improved. It was felt he could transition to an inhaler off the nebulizers and continue oral antibiotics. No further inpatient workup was indicated and he is considered stable for discharge, to follow-up as an outpatient.   BP 116/78 mmHg  Pulse 88  Temp(Src) 97.5 F (36.4 C) (Oral)  Resp 22  Ht 6' (1.829 m)  Wt 199 lb 6.4 oz (90.447 kg)  BMI 27.04 kg/m2  SpO2 95% PHYSICAL EXAM General: Well developed, well nourished, male in no acute distress. Head: Normocephalic, atraumatic.  Neck: Supple without bruits, JVD not elevated. Lungs:  Resp regular and unlabored, some rales, improved air exchange. Heart: Irreg Irreg, S1, S2, no S3, S4, or murmur; no rub. Abdomen: Soft, non-tender, non-distended, BS + x 4.  Extremities: No clubbing, cyanosis, no edema.  Neuro: Alert and oriented X 3. Moves all extremities spontaneously. Psych: Normal affect. Labs:   Lab Results  Component Value Date   WBC 12.5*  11/28/2014   HGB 12.7* 11/28/2014   HCT 37.9* 11/28/2014   MCV 97.4 11/28/2014   PLT 164 11/28/2014     Recent Labs Lab 11/28/14 0532  NA 133*  K 5.1  CL 96  CO2 21  BUN 42*  CREATININE 1.48*  CALCIUM 9.6  GLUCOSE 210*    Recent Labs  11/27/14 1910 11/28/14 0001 11/28/14 0532  TROPONINI 0.33* <0.30 <0.30   PRO B NATRIURETIC PEPTIDE (BNP)  Date/Time Value Ref Range Status  11/27/2014 10:49 AM 6703.0* 0 - 450 pg/mL Final  11/13/2014 03:19 PM 3446.0* 0 - 450 pg/mL Final    Recent Labs  11/30/14 0400  INR 2.69*      Radiology: Dg Chest Port 1 View 11/27/2014   CLINICAL DATA:  Cough for 4-5 days, left-sided chest pain  EXAM: PORTABLE CHEST - 1 VIEW  COMPARISON:  10/22/2014  FINDINGS: Cardiac shadow is within normal limits. Postsurgical changes are again seen. The lungs are well aerated. Mild scarring is noted in the left lung base. No acute bony abnormality is noted.  IMPRESSION: No acute abnormality noted.   Electronically Signed   By: Alcide Clever M.D.   On: 11/27/2014 11:37   EKG: 11/28/2014 Atrial fibrillation, rapid ventricular response, rate 134   FOLLOW UP PLANS AND APPOINTMENTS Allergies  Allergen Reactions  . Butrans [Buprenorphine] Shortness Of Breath  . Influenza Vaccines Other (See Comments)    Severe coughing for weeks after vaccine; also had flu like symptoms for about 7 to 8 weeks.   . Lanoxin [Digoxin] Other (See Comments)    Nausea, anorexia, caused tremendous weight loss  . Ranexa [Ranolazine Er] Other (See Comments)    TOLD NEVER TO TAKE MED-PER MD  . Lisinopril Other (See Comments)    cough  . Clarithromycin Other (See Comments)    Unknown, thinks "blisters in mouth"  . Statins Other (See Comments)    unknown     Medication List    STOP taking these medications        acetaminophen 650 MG CR tablet  Commonly known as:  TYLENOL      TAKE these medications        aspirin EC 81 MG tablet  Take 81 mg by mouth daily.     calcium  carbonate 600 MG Tabs tablet  Commonly known as:  OS-CAL  Take 600 mg by mouth at bedtime.     doxycycline 100 MG tablet  Commonly known as:  VIBRA-TABS  Take 1 tablet (100 mg total) by mouth every 12 (twelve) hours.     Fish Oil 1000 MG Caps  Take 3,000 mg by mouth every morning.     fluticasone 50 MCG/ACT nasal spray  Commonly known as:  FLONASE  Place 2 sprays into both nostrils daily.     GUAIFENESIN PO  Take 1 tablet by mouth 2 (two) times daily.     hydrALAZINE 25 MG tablet  Commonly known as:  APRESOLINE  Take 1 tablet (25 mg total) by mouth every 8 (eight) hours.     Insulin Glargine 100 UNIT/ML Solostar Pen  Commonly known as:  LANTUS SOLOSTAR  Inject 30 Units into the skin daily.     isosorbide mononitrate 60 MG 24 hr tablet  Commonly known as:  IMDUR  Take 1 tablet (60 mg total) by mouth daily.     levalbuterol 45 MCG/ACT inhaler  Commonly known as:  XOPENEX HFA  Inhale 1-2 puffs into the lungs every 8 (eight) hours as needed for wheezing.     levothyroxine 175 MCG tablet  Commonly known as:  SYNTHROID, LEVOTHROID  Take 175 mcg by mouth daily before breakfast.     metoprolol succinate 50 MG 24 hr tablet  Commonly known as:  TOPROL-XL  Take 1 tablet (50 mg total) by mouth 3 (three) times daily. Take with or immediately following a meal.     nitroGLYCERIN 0.4 MG SL tablet  Commonly known as:  NITROSTAT  Place 1 tablet (0.4 mg total) under the tongue every 5 (five) minutes as needed for chest pain.     omeprazole 20 MG capsule  Commonly known as:  PRILOSEC  Take 20 mg by mouth daily.     ONGLYZA 5 MG Tabs tablet  Generic drug:  saxagliptin HCl  Take 5 mg by mouth daily.     oxyCODONE-acetaminophen 5-325 MG per tablet  Commonly known as:  PERCOCET/ROXICET  Take 1-2 tablets by mouth every 4 (four) hours as needed for severe pain.     polyethylene glycol packet  Commonly known as:  MIRALAX / GLYCOLAX  Take 17 g by mouth daily as needed.      predniSONE 10 MG tablet  Commonly known as:  DELTASONE  Take 5 mg by mouth 2 (two) times daily with a meal.     rosuvastatin 20 MG tablet  Commonly known as:  CRESTOR  Take 1 tablet (20 mg total) by mouth daily.     senna 8.6 MG Tabs tablet  Commonly known as:  SENOKOT  Take 1 tablet (8.6 mg total) by mouth daily.     torsemide 20 MG tablet  Commonly known as:  DEMADEX  Take 2 tablets (40 mg total) by mouth daily.     triamcinolone cream 0.1 %  Commonly known as:  KENALOG  Apply 1 application topically as needed (for rash).     warfarin 5 MG tablet  Commonly known as:  COUMADIN  Take 1-2 tablets by mouth daily as directed        Discharge Instructions    (HEART FAILURE PATIENTS) Call MD:  Anytime you have any of the following symptoms: 1) 3 pound weight gain in 24 hours or 5 pounds in 1 week 2) shortness of breath, with or without a dry hacking cough 3) swelling in the hands, feet or stomach 4) if you have to sleep on extra pillows at night in order to breathe.    Complete by:  As directed      Diet - low sodium heart healthy    Complete by:  As directed      Diet Carb Modified    Complete by:  As directed      Increase activity slowly    Complete by:  As directed           Follow-up Information    Follow up with Barbaraann Share, MD On 12/04/2014.   Specialty:  Pulmonary Disease   Why:  at 10:45 AM   Contact information:   [address] [place] [address] [phone]       Follow up with Runell Gess,  MD.   Specialty:  Cardiology   Why:  The office will call.   Contact information:   [address] [place] [address] [phone]       Follow up with Marca Ancona, MD.   Specialty:  Cardiology   Why:  As scheduled   Contact information:   [address]  [address] [place] [address] [phone]       BRING ALL MEDICATIONS WITH YOU TO FOLLOW UP APPOINTMENTS  Time spent with patient to include physician time: 43  min Signed: Theodore Demark, PA-C 11/30/2014, 12:43 PM Co-Sign MD

## 2014-11-30 NOTE — Discharge Instructions (Signed)
Atrial Fibrillation  Atrial fibrillation is a condition that causes your heart to beat irregularly. It may also cause your heart to beat faster than normal. Atrial fibrillation can prevent your heart from pumping blood normally. It increases your risk of stroke and heart problems.  HOME CARE  · Take medications as told by your doctor.  · Only take medications that your doctor says are safe. Some medications can make the condition worse or happen again.  · If blood thinners were prescribed by your doctor, take them exactly as told. Too much can cause bleeding. Too little and you will not have the needed protection against stroke and other problems.  · Perform blood tests at home if told by your doctor.  · Perform blood tests exactly as told by your doctor.  · Do not drink alcohol.  · Do not drink beverages with caffeine such as coffee, soda, and some teas.  · Maintain a healthy weight.  · Do not use diet pills unless your doctor says they are safe. They may make heart problems worse.  · Follow diet instructions as told by your doctor.  · Exercise regularly as told by your doctor.  · Keep all follow-up appointments.  GET HELP IF:  · You notice a change in the speed, rhythm, or strength of your heartbeat.  · You suddenly begin peeing (urinating) more often.  · You get tired more easily when moving or exercising.  GET HELP RIGHT AWAY IF:   · You have chest or belly (abdominal) pain.  · You feel sick to your stomach (nauseous).  · You are short of breath.  · You suddenly have swollen feet and ankles.  · You feel dizzy.  · You face, arms, or legs feel numb or weak.  · There is a change in your vision or speech.  MAKE SURE YOU:   · Understand these instructions.  · Will watch your condition.  · Will get help right away if you are not doing well or get worse.  Document Released: 09/08/2008 Document Revised: 04/16/2014 Document Reviewed: 01/10/2013  [employer]® Patient Information ©2015 [employer]. This information is not  intended to replace advice given to you by your health care provider. Make sure you discuss any questions you have with your health care provider.

## 2014-11-30 NOTE — Care Management Note (Signed)
    Page 1 of 1   11/30/2014     3:36:21 PM CARE MANAGEMENT NOTE 11/30/2014  Patient:  ROARK, RUFO   Account Number:  [id number]  Date Initiated:  11/30/2014  Documentation initiated by:  GRAVES-BIGELOW,Monicia Tse  Subjective/Objective Assessment:   Pt admitted for cp. D/c today.     Action/Plan:   HH resumption orders placed in EPIC. [facility] aware of services. SOC to begin within 24-48 hrs post d/c.   Anticipated DC Date:  11/30/2014   Anticipated DC Plan:  HOME W HOME HEALTH SERVICES      DC Planning Services  CM consult      [facility] Choice  Resumption Of Svcs/PTA Provider   Choice offered to / List presented to:  C-1 Patient        HH arranged  HH-1 RN  HH-2 PT      [facility] agency  [facility]   Status of service:  Completed, signed off Medicare Important Message given?  NO (If response is "NO", the following Medicare IM given date fields will be blank) Date Medicare IM given:   Medicare IM given by:   Date Additional Medicare IM given:   Additional Medicare IM given by:    Discharge Disposition:    Per UR Regulation:  Reviewed for med. necessity/level of care/duration of stay  If discussed at Long Length of Stay Meetings, dates discussed:    Comments:

## 2014-12-03 ENCOUNTER — Ambulatory Visit (INDEPENDENT_AMBULATORY_CARE_PROVIDER_SITE_OTHER): Payer: Medicare Other | Admitting: Pharmacist Clinician (PhC)/ Clinical Pharmacy Specialist

## 2014-12-03 ENCOUNTER — Telehealth (HOSPITAL_COMMUNITY): Payer: Self-pay | Admitting: Vascular Surgery

## 2014-12-03 DIAGNOSIS — I482 Chronic atrial fibrillation: Secondary | ICD-10-CM

## 2014-12-03 DIAGNOSIS — Z7901 Long term (current) use of anticoagulants: Secondary | ICD-10-CM

## 2014-12-03 DIAGNOSIS — I4821 Permanent atrial fibrillation: Secondary | ICD-10-CM

## 2014-12-03 LAB — POCT INR: INR: 3.5

## 2014-12-03 NOTE — Telephone Encounter (Signed)
Open in error

## 2014-12-04 ENCOUNTER — Encounter: Payer: Self-pay | Admitting: Pulmonary Disease

## 2014-12-04 ENCOUNTER — Ambulatory Visit (INDEPENDENT_AMBULATORY_CARE_PROVIDER_SITE_OTHER): Payer: Medicare Other | Admitting: Pulmonary Disease

## 2014-12-04 VITALS — BP 104/60 | HR 86 | Temp 96.7°F | Ht 72.0 in | Wt 209.4 lb

## 2014-12-04 DIAGNOSIS — R0602 Shortness of breath: Secondary | ICD-10-CM

## 2014-12-04 DIAGNOSIS — J986 Disorders of diaphragm: Secondary | ICD-10-CM

## 2014-12-04 NOTE — Patient Instructions (Signed)
Try and stay as active as possible. Stop your xopenex inhaler Will send an order to [facility] to discontinue your oxygen. followup with me as needed.

## 2014-12-04 NOTE — Progress Notes (Signed)
   Subjective:    Patient ID: [name], male    DOB: December 28, 1940, 73 y.o.   MRN: [id number]  HPI The patient comes in today for pulmonary follow-up after a long hiatus. He has a known paralyzed left hemidiaphragm with restrictive physiology, a severe cardiomyopathy, atrial fibrillation with poor rate control, and severe debility. He was recently hospitalized with chest pain and a degree of heart failure, and responded to diuresis and better rate control of his A. fib. He was also felt to have a upper respiratory infection, and was treated with an antibiotic and given a Xopenex inhaler at discharge. 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 His chest x-rays during his hospitalization showed no acute process, and his echocardiogram showed his ejection fraction to be 25% with moderate MR and left atrial enlargement. Today, the patient tells me that his breathing is really no different than many years ago. His family has been  watching his oxygen level with exertional activities, and this has stayed above 90 for the most part.  He has had a recent overnight oximetry which showed desaturation as low as 79%, but this was very transient. He spent only 24 minutes less than 88%, and the majority of this was spent between 85 and 88%.  He denies any significant cough, congestion, or purulence at this time.  His wife tells me that he is having severe knee and back pain which keeps him from doing any type of activity.   Review of Systems  Constitutional: Negative for fever and unexpected weight change.  HENT: Negative for congestion, dental problem, ear pain, nosebleeds, postnasal drip, rhinorrhea, sinus pressure, sneezing, sore throat and trouble swallowing.   Eyes: Negative for redness and itching.  Respiratory: Positive for shortness of breath. Negative for cough, chest tightness and  wheezing.   Cardiovascular: Negative for palpitations and leg swelling.  Gastrointestinal: Negative for nausea and vomiting.  Genitourinary: Negative for dysuria.  Musculoskeletal: Negative for joint swelling.  Skin: Negative for rash.  Neurological: Negative for headaches.  Hematological: Does not bruise/bleed easily.  Psychiatric/Behavioral: Negative for dysphoric mood. The patient is not nervous/anxious.        Objective:   Physical Exam Frail-appearing male in no acute distress Nose without purulence or discharge noted Neck without lymphadenopathy or thyromegaly Chest with decreased breath sounds in the left base, otherwise clear Cardiac exam with irregular rhythm, tachycardia, no definite murmurs Lower extremities with mild edema, no cyanosis Alert and oriented, moves all 4 extremities.       Assessment & Plan:

## 2014-12-04 NOTE — Assessment & Plan Note (Signed)
The patient has severe multifactorial dyspnea on exertion, and this includes a cardiomyopathy, atrial fibrillation with poor control currently, a paralyzed hemidiaphragm with restriction on pulmonary function studies, and finally severe musculoskeletal debility. I do not see an active pulmonary process to treat more aggressively, and have asked him to discontinue his beta agonist since we have never documented significant obstructive lung disease.  I have offered to repeat his pulmonary function studies, but I doubt they will show anything different from his study in 2011. His family has been checking his oxygen level with exertion on room air, and he has not had any significant desaturation. He is also had an overnight oximetry with only 24 minutes being less than 88%, and the majority of this was between 85 and 88%. This really does not qualify him for nocturnal oxygen. I think improving his conditioning will help his breathing more than anything else, but the patient has severe musculoskeletal issues involving his back and knees. His wife states that he is pretty much chair bound. The patient tells me that his breathing is really not much different from 2-3 years ago. At this point, I have nothing else to offer her from a pulmonary standpoint.

## 2014-12-06 ENCOUNTER — Ambulatory Visit: Payer: Medicare Other | Admitting: Cardiology

## 2014-12-10 ENCOUNTER — Other Ambulatory Visit: Payer: Self-pay | Admitting: Physician Assistant

## 2014-12-10 ENCOUNTER — Ambulatory Visit (INDEPENDENT_AMBULATORY_CARE_PROVIDER_SITE_OTHER): Payer: Medicare Other | Admitting: Pharmacist Clinician (PhC)/ Clinical Pharmacy Specialist

## 2014-12-10 DIAGNOSIS — Z7901 Long term (current) use of anticoagulants: Secondary | ICD-10-CM

## 2014-12-10 DIAGNOSIS — I4821 Permanent atrial fibrillation: Secondary | ICD-10-CM

## 2014-12-10 DIAGNOSIS — I482 Chronic atrial fibrillation: Secondary | ICD-10-CM

## 2014-12-10 LAB — POCT INR: INR: 1.7

## 2014-12-11 ENCOUNTER — Other Ambulatory Visit: Payer: Self-pay | Admitting: Physician Assistant

## 2014-12-11 ENCOUNTER — Other Ambulatory Visit: Payer: Self-pay | Admitting: *Deleted

## 2014-12-11 MED ORDER — HYDRALAZINE HCL 25 MG PO TABS: 25.0000 mg | ORAL_TABLET | Freq: Three times a day (TID) | ORAL | Status: DC

## 2014-12-11 NOTE — Telephone Encounter (Signed)
Rx(s) sent to pharmacy electronically.  

## 2014-12-11 NOTE — Telephone Encounter (Signed)
Rx refill denied to patient pharmacy   

## 2014-12-18 ENCOUNTER — Telehealth: Payer: Self-pay | Admitting: *Deleted

## 2014-12-18 ENCOUNTER — Ambulatory Visit: Payer: Medicare Other | Admitting: Physician Assistant

## 2014-12-18 NOTE — Telephone Encounter (Signed)
PA for Torsemide sent via CoverMyMeds

## 2014-12-19 ENCOUNTER — Ambulatory Visit (INDEPENDENT_AMBULATORY_CARE_PROVIDER_SITE_OTHER): Payer: Medicare Other | Admitting: Cardiology

## 2014-12-19 ENCOUNTER — Ambulatory Visit (INDEPENDENT_AMBULATORY_CARE_PROVIDER_SITE_OTHER): Payer: Medicare Other | Admitting: Pharmacist Clinician (PhC)/ Clinical Pharmacy Specialist

## 2014-12-19 ENCOUNTER — Encounter: Payer: Self-pay | Admitting: Cardiology

## 2014-12-19 VITALS — BP 110/67 | HR 56 | Ht 72.0 in | Wt 209.0 lb

## 2014-12-19 DIAGNOSIS — R0602 Shortness of breath: Secondary | ICD-10-CM

## 2014-12-19 DIAGNOSIS — N183 Chronic kidney disease, stage 3 unspecified: Secondary | ICD-10-CM

## 2014-12-19 DIAGNOSIS — Z7901 Long term (current) use of anticoagulants: Secondary | ICD-10-CM | POA: Diagnosis not present

## 2014-12-19 DIAGNOSIS — Z79899 Other long term (current) drug therapy: Secondary | ICD-10-CM | POA: Diagnosis not present

## 2014-12-19 DIAGNOSIS — I482 Chronic atrial fibrillation: Secondary | ICD-10-CM | POA: Diagnosis not present

## 2014-12-19 DIAGNOSIS — I25700 Atherosclerosis of coronary artery bypass graft(s), unspecified, with unstable angina pectoris: Secondary | ICD-10-CM

## 2014-12-19 DIAGNOSIS — I5022 Chronic systolic (congestive) heart failure: Secondary | ICD-10-CM

## 2014-12-19 DIAGNOSIS — I4821 Permanent atrial fibrillation: Secondary | ICD-10-CM

## 2014-12-19 DIAGNOSIS — I4891 Unspecified atrial fibrillation: Secondary | ICD-10-CM

## 2014-12-19 LAB — POCT INR: INR: 3.5

## 2014-12-19 NOTE — Progress Notes (Signed)
12/19/2014   PCP: Thayer Headings, MD   Chief Complaint  Patient presents with  . Hospitalization Follow-up    Pt. complaining of some chest pain and chest pressure, pt. denies all other symptoms.    Primary Cardiologist:Dr. Erlene Quan Dr. [name]  HPI:  74 y.o. male with a history of S-CHF- in Nov treated with IABP, CAD, HTN, DM2, HLD, NSTEMI 10/2014 w/ med rx, chronic afib, CKD III, RA, who was admitted 12/15 w/ URI, chest pain, elevated HR.  His chronic atrial fib has been hard to control.  His BB was increased and he is not really aware of tachycardia, though he has SOB at times at rest.  He does admit to occ chest pain but he waits 5-10 min and it resolves without NTG.  He has on occ taken NTG since discharge with resolution of chest pain.    He sleeps in his recliner and has been doing well since last discharge.  Pt was to follow up in HF clinic with Dr. [name] but I do not see appt yet.    His glucose is followed by PCP and has recently seen just prior to recent hospitalization.   His wife and son are here today for visit.  His wife is very anxious and worried.  The pt looks well and other than SOB that may be related to rate he is making jokes.  He saw Dr. Shelle Iron on the 22nd and he feels his SOB is stable,  He does not really qualify for nocturnal oxygen.      Allergies  Allergen Reactions  . Butrans [Buprenorphine] Shortness Of Breath  . Influenza Vaccines Other (See Comments)    Severe coughing for weeks after vaccine; also had flu like symptoms for about 7 to 8 weeks.   . Lanoxin [Digoxin] Other (See Comments)    Nausea, anorexia, caused tremendous weight loss  . Ranexa [Ranolazine Er] Other (See Comments)    TOLD NEVER TO TAKE MED-PER MD  . Lisinopril Other (See Comments)    cough  . Clarithromycin Other (See Comments)    Unknown, thinks "blisters in mouth"  . Statins Other (See Comments)    unknown    Current Outpatient Prescriptions  Medication  Sig Dispense Refill  . aspirin EC 81 MG tablet Take 81 mg by mouth daily.    . calcium carbonate (OS-CAL) 600 MG TABS Take 600 mg by mouth at bedtime.     . fluticasone (FLONASE) 50 MCG/ACT nasal spray Place 2 sprays into both nostrils daily.     . hydrALAZINE (APRESOLINE) 25 MG tablet Take 1 tablet (25 mg total) by mouth every 8 (eight) hours. 90 tablet 3  . Insulin Glargine (LANTUS SOLOSTAR) 100 UNIT/ML Solostar Pen Inject 30 Units into the skin daily. 15 mL 11  . isosorbide mononitrate (IMDUR) 60 MG 24 hr tablet Take 1 tablet (60 mg total) by mouth daily. 30 tablet 3  . levothyroxine (SYNTHROID, LEVOTHROID) 175 MCG tablet Take 175 mcg by mouth daily before breakfast.    . metoprolol succinate (TOPROL-XL) 50 MG 24 hr tablet Take 1 tablet (50 mg total) by mouth 3 (three) times daily. Take with or immediately following a meal. 90 tablet 11  . nitroGLYCERIN (NITROSTAT) 0.4 MG SL tablet Place 1 tablet (0.4 mg total) under the tongue every 5 (five) minutes as needed for chest pain. 100 tablet 12  . Omega-3 Fatty Acids (FISH OIL) 1000 MG CAPS Take 3,000 mg  by mouth every morning.    [name] omeprazole (PRILOSEC) 20 MG capsule Take 20 mg by mouth daily.    [name] oxyCODONE-acetaminophen (PERCOCET/ROXICET) 5-325 MG per tablet Take 1-2 tablets by mouth every 4 (four) hours as needed for severe pain.    . polyethylene glycol (MIRALAX / GLYCOLAX) packet Take 17 g by mouth daily as needed. 14 each 0  . predniSONE (DELTASONE) 10 MG tablet Take 5 mg by mouth 2 (two) times daily with a meal.     . rosuvastatin (CRESTOR) 20 MG tablet Take 1 tablet (20 mg total) by mouth daily. 30 tablet 0  . saxagliptin HCl (ONGLYZA) 5 MG TABS tablet Take 5 mg by mouth daily.    [name] senna (SENOKOT) 8.6 MG TABS tablet Take 1 tablet (8.6 mg total) by mouth daily. 120 each 0  . torsemide (DEMADEX) 20 MG tablet Take 2 tablets (40 mg total) by mouth daily. 60 tablet 3  . triamcinolone cream (KENALOG) 0.1 % Apply 1 application topically as needed  (for rash).     . warfarin (COUMADIN) 5 MG tablet Take 1-2 tablets by mouth daily as directed (Patient taking differently: Take 7.5-10 mg by mouth daily at 6 PM. TAKES 10MG  ON MON ONLY; TAKES 7.5MG  ALL OTHER DAYS) 135 tablet 2   No current facility-administered medications for this visit.    Past Medical History  Diagnosis Date  . Pleuritis     h/o------ with negative vats biopsy  . Coronary artery disease     a. CABG 1998. b. NSTEMI 02/2013: Progression of dz in SVG-RPDA followed by severe stenosis in grafted vessel, not likely safely reacted for PCI - med rx. c. NSTEMI 10/2014: patent LIMA-LAD and SVG to OM, TO SVG-PDA with TO PDA and 60% proximal RCA, some left =>PDA collaterals; culprit was likely occlusion of SVG-PDA though this may have been subacute given some collaterals.  . 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 Chronic anticoagulation, on coumadin 01/12/2013  . Cardiomyopathy, ischemic EF 25%   . Hypothyroidism   . GERD (gastroesophageal reflux disease)   . Chronic atrial fibrillation   . Digitalis toxicity   . Chronic systolic CHF (congestive heart failure)     a. EF prev 25%, improved to 45-50% in 2014. b. 10/2014: EF back downt o 25%.  [name] Hyperlipidemia   . LBBB (left bundle branch block)   . CKD (chronic kidney disease), stage III   . Long term current use of systemic steroids     a. For RA.  [name] Spinal stenosis   . Hip pain     a. adductor muscle tears on prior MRI.  [name] Chronic chest pain     a. Intolerant of Ranexa.  . Hyperkalemia     a. Spironolactone d/c'd due to this.  . Elevated hemidiaphragm   . Moderate mitral regurgitation 10/2014 echo  . MI (myocardial infarction)     "10/14/2014 I was told I'd had 2 MI's & didn't even know it" (11/27/2014)  . Pneumonia 10/14/2014  . Type II diabetes mellitus   . Arthritis     all over  . Rheumatoid arthritis   . Chronic back pain     "all up and down my back"    Past Surgical  History  Procedure Laterality Date  . Lung surgery  1994    "infection in lungs; scraped it out on right; 6 wks later they did left; clipped left diaphragm & paralyzed it"  . Total  knee arthroplasty Bilateral 2000's  . Carpal tunnel release Bilateral 1990's  . Cataract extraction w/ intraocular lens  implant, bilateral Bilateral 1990's  . Cardiac catheterization Left 01/11/2013    Severe CAD with notable progression of disease in the SVG-RPDA followed by severe stenosis in grafted vessel, region not safely "reachede" for PCI, moderate ischemic CM no notable chage in EF, continue medical therapy  . Cardiac catheterization  04/03/2011    Left circumflex 95% stenosis, essentially unchanged from last cath  . Cardiac catheterization  04/01/2010    LAD 99% proximal stenosis with vague flow, circumflex 80% stenosis, essentially unchanged form last cath  . Cardiac catheterization  03/28/2010    Mild pulmonary hypertension, medical therapy  . Cardiac catheterization  04/30/2009    LAD 95% stenosis, essentially unchanged fomr last cath, medical therapy  . Cardiac catheterization  12/05/2008    2.5x12 apex, predilation in the distal PLA branch, 3x23 XIENCE V durg eluting stent and post dilated with a 3.25x20 [place] VOYAGER at 15 atmospheres  . Left heart catheterization with coronary/graft angiogram N/A 01/13/2013    Procedure: LEFT HEART CATHETERIZATION WITH Isabel Caprice;  Surgeon: Marykay Lex, MD;  Location: [facility] CATH LAB;  Service: Cardiovascular;  Laterality: N/A;  . Left and right heart catheterization with coronary/graft angiogram N/A 10/16/2014    Procedure: LEFT AND RIGHT HEART CATHETERIZATION WITH Isabel Caprice;  Surgeon: Marykay Lex, MD;  Location: [facility] CATH LAB;  Service: Cardiovascular;  Laterality: N/A;  . Coronary artery bypass graft  05/1997    "CABG X7"  . Patella fracture surgery Right "before replacement"  . Knee arthroscopy Left "before replacement     OVF:IEPPIRJ:JO colds or fevers,  weight stable Skin:no rashes or ulcers HEENT:no blurred vision, no congestion CV:see HPI PUL:see HPI GI:no diarrhea constipation or melena, no indigestion GU:no hematuria, no dysuria MS:no joint pain, no claudication Neuro:no syncope, no lightheadedness Endo:+ diabetes, + thyroid disease  Wt Readings from Last 3 Encounters:  12/19/14 209 lb (94.802 kg)  12/04/14 209 lb 6.4 oz (94.983 kg)  11/30/14 199 lb 6.4 oz (90.447 kg)    PHYSICAL EXAM BP 110/67 mmHg  Pulse 56  Ht 6' (1.829 m)  Wt 209 lb (94.802 kg)  BMI 28.34 kg/m2 General:Pleasant affect, NAD Skin:Warm and dry, brisk capillary refill HEENT:normocephalic, sclera clear, mucus membranes moist Neck:supple, + JVD, no bruits  Heart:irreg irreg without murmur, gallup, rub or click Lungs:clear without rales, rhonchi, or wheezes ACZ:YSAY, non tender, + BS, do not palpate liver spleen or masses Ext:tr lower ext edema, 2+ radial pulses Neuro:alert and oriented X 3, MAE, follows commands, + facial symmetry  EKG:a fib rate 106 to 150 RVR, no changes since 11/28/14  ASSESSMENT AND PLAN Atrial fibrillation with RVR Continues with rapid rate at times other times more controlled. Blood pressure stable but would be difficult to increase blocker at this point.  He has been intolerant to dig in the past.  He'll follow with Dr. [name] in the next couple of weeks and then with Dr. [name] in 6 weeks  Chronic systolic HF (heart failure) Currently stable he denies shortness of breath there is obviously short of breath at times. His weight is stable and he has minimal lower extremity edema lungs are clear  CAD (coronary artery disease) of artery bypass graft Left cardiac cath in November 2015, with medical therapy planned.  He continues with episodic chest pain mostly resolves with rest and 5-10 minutes on occasion he has had one nitroglycerin sublingual.  CKD (chronic kidney disease), stage III Will  recheck renal function today.  Chronic anticoagulation Followed here  DYSPNEA Chronic - stable today

## 2014-12-19 NOTE — Telephone Encounter (Signed)
[name] was calling in to give results to a non formulary request for Torsemide. Please call  thanks

## 2014-12-19 NOTE — Patient Instructions (Signed)
  We will see you back in follow up in 3 months with Dr [name].   The provider has ordered: 1. Your physician recommends that you return for lab work today.  2. You have been referred to Dr [name] in the Heart Failure Clinic

## 2014-12-19 NOTE — Assessment & Plan Note (Signed)
Currently stable he denies shortness of breath there is obviously short of breath at times. His weight is stable and he has minimal lower extremity edema lungs are clear

## 2014-12-19 NOTE — Assessment & Plan Note (Signed)
Chronic - stable today

## 2014-12-19 NOTE — Assessment & Plan Note (Signed)
Followed here

## 2014-12-19 NOTE — Assessment & Plan Note (Signed)
Will recheck renal function today.

## 2014-12-19 NOTE — Assessment & Plan Note (Signed)
Left cardiac cath in November 2015, with medical therapy planned.  He continues with episodic chest pain mostly resolves with rest and 5-10 minutes on occasion he has had one nitroglycerin sublingual.

## 2014-12-19 NOTE — Assessment & Plan Note (Signed)
Continues with rapid rate at times other times more controlled. Blood pressure stable but would be difficult to increase blocker at this point.  He has been intolerant to dig in the past.  He'll follow with Dr. [name] in the next couple of weeks and then with Dr. [name] in 6 weeks

## 2014-12-20 LAB — BASIC METABOLIC PANEL
BUN: 29 mg/dL — ABNORMAL HIGH (ref 6–23)
CALCIUM: 9.3 mg/dL (ref 8.4–10.5)
CO2: 26 meq/L (ref 19–32)
CREATININE: 1.59 mg/dL — AB (ref 0.50–1.35)
Chloride: 99 mEq/L (ref 96–112)
Glucose, Bld: 275 mg/dL — ABNORMAL HIGH (ref 70–99)
Potassium: 4.9 mEq/L (ref 3.5–5.3)
SODIUM: 136 meq/L (ref 135–145)

## 2014-12-20 LAB — PRO B NATRIURETIC PEPTIDE: Pro B Natriuretic peptide (BNP): 4338 pg/mL — ABNORMAL HIGH (ref <126)

## 2014-12-20 NOTE — Telephone Encounter (Signed)
PA for Torsemide approved through 12/19/2015.

## 2014-12-21 ENCOUNTER — Telehealth: Payer: Self-pay | Admitting: *Deleted

## 2014-12-21 DIAGNOSIS — R7989 Other specified abnormal findings of blood chemistry: Secondary | ICD-10-CM

## 2014-12-21 NOTE — Telephone Encounter (Signed)
Lab slip mailed to patient for lab recheck

## 2014-12-21 NOTE — Telephone Encounter (Signed)
-----   Message from Leone Brand, NP sent at 12/20/2014  3:46 PM EST ----- Please let family know labs are stable, glucose up some and lets check another BMP in 2 weeks to check kidney function.

## 2014-12-26 ENCOUNTER — Ambulatory Visit (INDEPENDENT_AMBULATORY_CARE_PROVIDER_SITE_OTHER): Payer: BLUE CROSS/BLUE SHIELD | Admitting: Pharmacist Clinician (PhC)/ Clinical Pharmacy Specialist

## 2014-12-26 DIAGNOSIS — Z7901 Long term (current) use of anticoagulants: Secondary | ICD-10-CM

## 2014-12-26 DIAGNOSIS — I4821 Permanent atrial fibrillation: Secondary | ICD-10-CM

## 2014-12-26 DIAGNOSIS — I482 Chronic atrial fibrillation: Secondary | ICD-10-CM

## 2014-12-26 LAB — POCT INR: INR: 2.3

## 2015-01-04 ENCOUNTER — Encounter (HOSPITAL_COMMUNITY): Payer: Self-pay

## 2015-01-09 ENCOUNTER — Telehealth: Payer: Self-pay | Admitting: Cardiovascular Disease

## 2015-01-09 NOTE — Telephone Encounter (Signed)
She faxed over an order on 12-03-14,still have not received it.Please let her know if she need to refax this.

## 2015-01-09 NOTE — Telephone Encounter (Signed)
I have not received anything by fax.  JC does not recall speaking with Victorino Dike.  I called Victorino Dike and left a message for her to refax what she needs signed.

## 2015-01-09 NOTE — Telephone Encounter (Signed)
[name], she stated she spoke w/ JC this morning and he'd given Victorino Dike fax number for [place]. Will route to her.

## 2015-01-11 NOTE — Telephone Encounter (Signed)
I received the order and will have it signed this afternoon by Dr [name].

## 2015-01-14 NOTE — Telephone Encounter (Signed)
Form signed and faxed to [facility]

## 2015-01-16 ENCOUNTER — Ambulatory Visit (INDEPENDENT_AMBULATORY_CARE_PROVIDER_SITE_OTHER): Payer: BLUE CROSS/BLUE SHIELD | Admitting: Pharmacist Clinician (PhC)/ Clinical Pharmacy Specialist

## 2015-01-16 ENCOUNTER — Ambulatory Visit (HOSPITAL_COMMUNITY)
Admission: RE | Admit: 2015-01-16 | Discharge: 2015-01-16 | Disposition: A | Discharge status: Home / Self Care | Payer: BLUE CROSS/BLUE SHIELD | Source: Ambulatory Visit | Attending: Internal Medicine | Admitting: Internal Medicine

## 2015-01-16 VITALS — BP 112/54 | HR 92 | Wt 216.5 lb

## 2015-01-16 DIAGNOSIS — Z7901 Long term (current) use of anticoagulants: Secondary | ICD-10-CM

## 2015-01-16 DIAGNOSIS — I2510 Atherosclerotic heart disease of native coronary artery without angina pectoris: Secondary | ICD-10-CM | POA: Diagnosis not present

## 2015-01-16 DIAGNOSIS — I4891 Unspecified atrial fibrillation: Secondary | ICD-10-CM | POA: Diagnosis not present

## 2015-01-16 DIAGNOSIS — R0600 Dyspnea, unspecified: Secondary | ICD-10-CM | POA: Diagnosis not present

## 2015-01-16 DIAGNOSIS — N183 Chronic kidney disease, stage 3 unspecified: Secondary | ICD-10-CM

## 2015-01-16 DIAGNOSIS — I5022 Chronic systolic (congestive) heart failure: Secondary | ICD-10-CM | POA: Diagnosis present

## 2015-01-16 DIAGNOSIS — Z951 Presence of aortocoronary bypass graft: Secondary | ICD-10-CM | POA: Diagnosis not present

## 2015-01-16 DIAGNOSIS — I482 Chronic atrial fibrillation: Secondary | ICD-10-CM

## 2015-01-16 DIAGNOSIS — I255 Ischemic cardiomyopathy: Secondary | ICD-10-CM

## 2015-01-16 DIAGNOSIS — R0602 Shortness of breath: Secondary | ICD-10-CM

## 2015-01-16 DIAGNOSIS — I4821 Permanent atrial fibrillation: Secondary | ICD-10-CM

## 2015-01-16 LAB — BASIC METABOLIC PANEL
BUN: 19 mg/dL (ref 6–23)
CALCIUM: 9.6 mg/dL (ref 8.4–10.5)
CO2: 24 meq/L (ref 19–32)
CREATININE: 1.36 mg/dL — AB (ref 0.50–1.35)
Chloride: 99 mEq/L (ref 96–112)
Glucose, Bld: 177 mg/dL — ABNORMAL HIGH (ref 70–99)
POTASSIUM: 4 meq/L (ref 3.5–5.3)
Sodium: 136 mEq/L (ref 135–145)

## 2015-01-16 LAB — POCT INR: INR: 2

## 2015-01-16 NOTE — Patient Instructions (Signed)
Doing great!  Follow up 1 month.  Do the following things EVERYDAY: 1) Weigh yourself in the morning before breakfast. Write it down and keep it in a log. 2) Take your medicines as prescribed 3) Eat low salt foods-Limit salt (sodium) to 2000 mg per day.  4) Stay as active as you can everyday 5) Limit all fluids for the day to less than 2 liters  

## 2015-01-16 NOTE — Progress Notes (Signed)
Patient ID: [name], male   DOB: [birth year], 74 y.o.   MRN: [id number]  PCP: Dr. Thayer Headings Primary Cardiologist: Dr. [name]  HPI: [name] is a 74 yo male with a history of HTN, DM2, HLD, CAD s/p CABG 1998, ischemic cardiomyopathy, chronic angina, chronic atrial fibrillation, LBBB, rheumatoid arthritis on chronic steroids, and severe spinal stenosis.  He has a chronic elevated left hemidiaphragm.  He has left hip pain with adductor muscle tears on prior MRI.   Last admitted to [facility] with chest pain. Cardiac enzymes negative. Chest discomfort thought to be from upper respiratory infection and not heart failure. Discharge weight  199 pounds.    He returns for follow up. Overall feels ok. Mild dyspnea most of the time. Denies CP/PND. Uses wheelchair most of the time. Saw Dr Shelle Iron in late December with no real change. No bleeding problems. Good appetite. Weight at home 210-212  pounds. Taking all medications.   Labs (11/15): K 5.3, creatinine 1.98, BNP 15184 => 5069, hemoglobin 13.6          Studies: Echo (10/15/14): EF 25%, mod calcified annulus mitral valve, mod MR, RV sys fx mildly reduced LHC (10/16/14): patent LIMA-LAD and SVG to OM, TO SVG-PDA with TO PDA and 60% proximal RCA, some left =>PDA collaterals;  RHC (10/16/14): RA 20, RV 69/11, PA 69/36 (35), PCWP 35, PA sat: 46% on 100% NRB, Fick CO/CI: 3.15/1.43, Thermo CO/CI: 3.61/1.63 ECG (11/15): atrial fibrillation with LBBB, QRS 132 msec.   ROS: All systems negative except as listed in HPI, PMH and Problem List.  SH:  History   Social History  . Marital Status: Married    Spouse Name: N/A    Number of Children: N/A  . Years of Education: N/A   Occupational History  . Not on file.   Social History Main Topics  . Smoking status: Never Smoker   . Smokeless tobacco: Never Used  . Alcohol Use: No  . Drug Use: No  . Sexual Activity: Not Currently   Other Topics Concern  . Not on file   Social History Narrative     FH:  Family History  Problem Relation Age of Onset  . Heart disease Mother   . Heart disease Father   . Heart disease Brother   . Heart disease Maternal Aunt   . Cancer      Past Medical History  Diagnosis Date  . Pleuritis     h/o------ with negative vats biopsy  . Coronary artery disease     a. CABG 1998. b. NSTEMI 02/2013: Progression of dz in SVG-RPDA followed by severe stenosis in grafted vessel, not likely safely reacted for PCI - med rx. c. NSTEMI 10/2014: patent LIMA-LAD and SVG to OM, TO SVG-PDA with TO PDA and 60% proximal RCA, some left =>PDA collaterals; culprit was likely occlusion of SVG-PDA though this may have been subacute given some collaterals.  . 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 Chronic anticoagulation, on coumadin 01/12/2013  . Cardiomyopathy, ischemic EF 25%   . Hypothyroidism   . GERD (gastroesophageal reflux disease)   . Chronic atrial fibrillation   . Digitalis toxicity   . Chronic systolic CHF (congestive heart failure)     a. EF prev 25%, improved to 45-50% in 2014. b. 10/2014: EF back downt o 25%.  [name] Hyperlipidemia   . LBBB (left bundle branch block)   . CKD (chronic kidney disease), stage III   . Long  term current use of systemic steroids     a. For RA.  [name] Spinal stenosis   . Hip pain     a. adductor muscle tears on prior MRI.  [name] Chronic chest pain     a. Intolerant of Ranexa.  . Hyperkalemia     a. Spironolactone d/c'd due to this.  . Elevated hemidiaphragm   . Moderate mitral regurgitation 10/2014 echo  . MI (myocardial infarction)     "10/14/2014 I was told I'd had 2 MI's & didn't even know it" (11/27/2014)  . Pneumonia 10/14/2014  . Type II diabetes mellitus   . Arthritis     all over  . Rheumatoid arthritis   . Chronic back pain     "all up and down my back"    Current Outpatient Prescriptions  Medication Sig Dispense Refill  . aspirin EC 81 MG tablet Take 81 mg by mouth daily.     . calcium carbonate (OS-CAL) 600 MG TABS Take 600 mg by mouth at bedtime.     . fluticasone (FLONASE) 50 MCG/ACT nasal spray Place 2 sprays into both nostrils daily.     . hydrALAZINE (APRESOLINE) 25 MG tablet Take 1 tablet (25 mg total) by mouth every 8 (eight) hours. 90 tablet 3  . Insulin Glargine (LANTUS SOLOSTAR) 100 UNIT/ML Solostar Pen Inject 30 Units into the skin daily. 15 mL 11  . isosorbide mononitrate (IMDUR) 60 MG 24 hr tablet Take 1 tablet (60 mg total) by mouth daily. 30 tablet 3  . levothyroxine (SYNTHROID, LEVOTHROID) 175 MCG tablet Take 175 mcg by mouth daily before breakfast.    . metoprolol succinate (TOPROL-XL) 50 MG 24 hr tablet Take 1 tablet (50 mg total) by mouth 3 (three) times daily. Take with or immediately following a meal. 90 tablet 11  . nitroGLYCERIN (NITROSTAT) 0.4 MG SL tablet Place 1 tablet (0.4 mg total) under the tongue every 5 (five) minutes as needed for chest pain. 100 tablet 12  . Omega-3 Fatty Acids (FISH OIL) 1000 MG CAPS Take 3,000 mg by mouth every morning.    [name] omeprazole (PRILOSEC) 20 MG capsule Take 20 mg by mouth daily.    [name] oxyCODONE-acetaminophen (PERCOCET/ROXICET) 5-325 MG per tablet Take 1-2 tablets by mouth every 4 (four) hours as needed for severe pain.    . polyethylene glycol (MIRALAX / GLYCOLAX) packet Take 17 g by mouth daily as needed. 14 each 0  . predniSONE (DELTASONE) 10 MG tablet Take 5 mg by mouth 2 (two) times daily with a meal.     . rosuvastatin (CRESTOR) 20 MG tablet Take 1 tablet (20 mg total) by mouth daily. 30 tablet 0  . saxagliptin HCl (ONGLYZA) 5 MG TABS tablet Take 5 mg by mouth daily.    [name] senna (SENOKOT) 8.6 MG TABS tablet Take 1 tablet (8.6 mg total) by mouth daily. 120 each 0  . torsemide (DEMADEX) 20 MG tablet Take 2 tablets (40 mg total) by mouth daily. 60 tablet 3  . triamcinolone cream (KENALOG) 0.1 % Apply 1 application topically as needed (for rash).     . warfarin (COUMADIN) 5 MG tablet Take 1-2 tablets by mouth  daily as directed (Patient taking differently: Take 7.5-10 mg by mouth daily at 6 PM. TAKES 10MG  ON MON ONLY; TAKES 7.5MG  ALL OTHER DAYS) 135 tablet 2   No current facility-administered medications for this encounter.    Filed Vitals:   01/16/15 1107  BP: 112/54  Pulse: 92  Weight: 216  lb 8 oz (98.204 kg)  SpO2: 97%    PHYSICAL EXAM:  General:No resp difficulty, in wheelchair, wife and son present HEENT: normal Neck: supple. JVP 8-9. Carotids 2+ bilaterally; no bruits. No lymphadenopathy or thryomegaly appreciated. Cor: PMI normal. Heart irregular S1/S2, no S3/S4, 1/6 HSM at apex.  Lungs: Decreased breath sounds at left base.  Abdomen: soft, nontender, nondistended. No hepatosplenomegaly. No bruits or masses. Good bowel sounds. Extremities: no cyanosis, clubbing, rash,  R and LLE 1+ edema Neuro: alert & orientedx3, cranial nerves grossly intact. Moves all 4 extremities w/o difficulty. Affect pleasant.  ASSESSMENT & PLAN: 1) Chronic systolic heart failure: EF 25% (10/2014), ischemic cardiomyopathy s/p CABG.  Patient recently admitted to the hospital for A/C systolic CHF with cardiogenic shock in the setting of NSTEMI. He had IABP placed and was diuresed with IV lasix and milrinone. He was weaned off milrinone.    NYHA IIIb symptoms with mild overload but he has not had torsemide today. Plans to take after appointment. Continue current dose of torsemide.  - Will continue Toprol XL 50 mg tid.  hydralazine 25 mg TID and Imdur 60 mg daily.  Not on spiro or Ace due to hyperkalemia -- No digoxin due to history of digoxin toxicity.  - Will need repeat Echo in 2/16 to reassess EF. If remains less than 35% will need referral for consideration of CRT-D. He has LBBB last QRS 134 msec. - Reinforced the need and importance of daily weights, a low sodium diet, and fluid restriction (less than 2 L a day). Instructed to call the HF clinic if weight increases more than 3 lbs overnight or 5 lbs in a  week.  2) CAD s/p CABG: Rare chest pain. Culprit for recent NSTEMI was likely occlusion of SVG-PDA.  There was no good target for intervention. No evidence of ischemia.  - Continue ASA and statin.  3) Dyspnea:  Saw Dr Shelle Iron in late December with no additional recommendations.    4) Atrial fibrillation: Chronic. HR controlled, will continue Toprol and warfarin.   5) Immobility: Continue to try and be as active as possible.  Follow up in 1 month.  CLEGG,AMY 01/16/2015

## 2015-01-17 ENCOUNTER — Encounter: Payer: Self-pay | Admitting: *Deleted

## 2015-01-21 ENCOUNTER — Ambulatory Visit (HOSPITAL_COMMUNITY)
Admission: RE | Admit: 2015-01-21 | Discharge: 2015-01-21 | Disposition: A | Discharge status: Home / Self Care | Payer: Medicare Other | Source: Ambulatory Visit | Attending: Cardiology | Admitting: Cardiology

## 2015-01-21 DIAGNOSIS — I5022 Chronic systolic (congestive) heart failure: Secondary | ICD-10-CM

## 2015-01-21 DIAGNOSIS — I509 Heart failure, unspecified: Secondary | ICD-10-CM

## 2015-02-13 ENCOUNTER — Ambulatory Visit (HOSPITAL_COMMUNITY)
Admission: RE | Admit: 2015-02-13 | Discharge: 2015-02-13 | Disposition: A | Discharge status: Home / Self Care | Payer: Medicare Other | Source: Ambulatory Visit | Attending: Cardiology | Admitting: Cardiology

## 2015-02-13 ENCOUNTER — Ambulatory Visit (INDEPENDENT_AMBULATORY_CARE_PROVIDER_SITE_OTHER): Payer: Medicare Other | Admitting: Pharmacist Clinician (PhC)/ Clinical Pharmacy Specialist

## 2015-02-13 ENCOUNTER — Encounter (HOSPITAL_COMMUNITY): Payer: Self-pay

## 2015-02-13 VITALS — BP 116/60 | HR 77 | Wt 215.5 lb

## 2015-02-13 DIAGNOSIS — I10 Essential (primary) hypertension: Secondary | ICD-10-CM | POA: Diagnosis not present

## 2015-02-13 DIAGNOSIS — Z7901 Long term (current) use of anticoagulants: Secondary | ICD-10-CM | POA: Insufficient documentation

## 2015-02-13 DIAGNOSIS — E119 Type 2 diabetes mellitus without complications: Secondary | ICD-10-CM | POA: Diagnosis not present

## 2015-02-13 DIAGNOSIS — Z7952 Long term (current) use of systemic steroids: Secondary | ICD-10-CM | POA: Diagnosis not present

## 2015-02-13 DIAGNOSIS — I4821 Permanent atrial fibrillation: Secondary | ICD-10-CM

## 2015-02-13 DIAGNOSIS — I5022 Chronic systolic (congestive) heart failure: Secondary | ICD-10-CM

## 2015-02-13 DIAGNOSIS — I447 Left bundle-branch block, unspecified: Secondary | ICD-10-CM | POA: Insufficient documentation

## 2015-02-13 DIAGNOSIS — Z951 Presence of aortocoronary bypass graft: Secondary | ICD-10-CM | POA: Diagnosis not present

## 2015-02-13 DIAGNOSIS — Z7982 Long term (current) use of aspirin: Secondary | ICD-10-CM | POA: Diagnosis not present

## 2015-02-13 DIAGNOSIS — E039 Hypothyroidism, unspecified: Secondary | ICD-10-CM | POA: Insufficient documentation

## 2015-02-13 DIAGNOSIS — I255 Ischemic cardiomyopathy: Secondary | ICD-10-CM | POA: Diagnosis not present

## 2015-02-13 DIAGNOSIS — I129 Hypertensive chronic kidney disease with stage 1 through stage 4 chronic kidney disease, or unspecified chronic kidney disease: Secondary | ICD-10-CM | POA: Insufficient documentation

## 2015-02-13 DIAGNOSIS — I482 Chronic atrial fibrillation: Secondary | ICD-10-CM

## 2015-02-13 DIAGNOSIS — I252 Old myocardial infarction: Secondary | ICD-10-CM | POA: Insufficient documentation

## 2015-02-13 DIAGNOSIS — Z794 Long term (current) use of insulin: Secondary | ICD-10-CM | POA: Insufficient documentation

## 2015-02-13 DIAGNOSIS — I2510 Atherosclerotic heart disease of native coronary artery without angina pectoris: Secondary | ICD-10-CM | POA: Insufficient documentation

## 2015-02-13 DIAGNOSIS — N183 Chronic kidney disease, stage 3 (moderate): Secondary | ICD-10-CM | POA: Diagnosis not present

## 2015-02-13 DIAGNOSIS — E785 Hyperlipidemia, unspecified: Secondary | ICD-10-CM | POA: Insufficient documentation

## 2015-02-13 DIAGNOSIS — Z79899 Other long term (current) drug therapy: Secondary | ICD-10-CM | POA: Diagnosis not present

## 2015-02-13 DIAGNOSIS — M069 Rheumatoid arthritis, unspecified: Secondary | ICD-10-CM | POA: Diagnosis not present

## 2015-02-13 DIAGNOSIS — K219 Gastro-esophageal reflux disease without esophagitis: Secondary | ICD-10-CM | POA: Insufficient documentation

## 2015-02-13 DIAGNOSIS — E875 Hyperkalemia: Secondary | ICD-10-CM | POA: Diagnosis not present

## 2015-02-13 LAB — POCT INR: INR: 3.9

## 2015-02-13 NOTE — Progress Notes (Signed)
Patient ID: [name], male   DOB: 04-09-1941, 74 y.o.   MRN: [id number]  PCP: Dr. Thayer Headings Primary Cardiologist: Dr. [name]  HPI: [name] is a 74 yo male with a history of HTN, DM2, HLD, CAD s/p CABG 1998, ischemic cardiomyopathy, chronic angina, chronic atrial fibrillation, LBBB, rheumatoid arthritis on chronic steroids, and severe spinal stenosis.  He has a chronic elevated left hemidiaphragm.  He has left hip pain with adductor muscle tears on prior MRI.   Last admitted to [facility] with chest pain. Cardiac enzymes negative. Chest discomfort thought to be from upper respiratory infection and not heart failure. Discharge weight  199 pounds.    He returns for follow up. Overall feels ok. Had GI virus this week Mild dyspnea most of the time with exertion. Denies CP/PND. Uses wheelchair most of the time. Saw Dr Shelle Iron in late December with no real change. No bleeding problems. Good appetite. Weight at home 210-212  pounds. Taking all medications.   Labs (11/15): K 5.3, creatinine 1.98, BNP 15184 => 5069, hemoglobin 13.6  Labs (01/16/2015) : K 4.0 Creatinine 1.36          Studies: Echo (10/15/14): EF 25%, mod calcified annulus mitral valve, mod MR, RV sys fx mildly reduced LHC (10/16/14): patent LIMA-LAD and SVG to OM, TO SVG-PDA with TO PDA and 60% proximal RCA, some left =>PDA collaterals;  RHC (10/16/14): RA 20, RV 69/11, PA 69/36 (35), PCWP 35, PA sat: 46% on 100% NRB, Fick CO/CI: 3.15/1.43, Thermo CO/CI: 3.61/1.63 ECG (11/15): atrial fibrillation with LBBB, QRS 132 msec.  ECHO 01/2015: EF 25%.   ROS: All systems negative except as listed in HPI, PMH and Problem List.  SH:  History   Social History  . Marital Status: Married    Spouse Name: N/A  . Number of Children: N/A  . Years of Education: N/A   Occupational History  . Not on file.   Social History Main Topics  . Smoking status: Never Smoker   . Smokeless tobacco: Never Used  . Alcohol Use: No  . Drug Use: No  .  Sexual Activity: Not Currently   Other Topics Concern  . Not on file   Social History Narrative    FH:  Family History  Problem Relation Age of Onset  . Heart disease Mother   . Heart disease Father   . Heart disease Brother   . Heart disease Maternal Aunt   . Cancer      Past Medical History  Diagnosis Date  . Pleuritis     h/o------ with negative vats biopsy  . Coronary artery disease     a. CABG 1998. b. NSTEMI 02/2013: Progression of dz in SVG-RPDA followed by severe stenosis in grafted vessel, not likely safely reacted for PCI - med rx. c. NSTEMI 10/2014: patent LIMA-LAD and SVG to OM, TO SVG-PDA with TO PDA and 60% proximal RCA, some left =>PDA collaterals; culprit was likely occlusion of SVG-PDA though this may have been subacute given some collaterals.  . 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 Chronic anticoagulation, on coumadin 01/12/2013  . Cardiomyopathy, ischemic EF 25%   . Hypothyroidism   . GERD (gastroesophageal reflux disease)   . Chronic atrial fibrillation   . Digitalis toxicity   . Chronic systolic CHF (congestive heart failure)     a. EF prev 25%, improved to 45-50% in 2014. b. 10/2014: EF back downt o 25%.  [name] Hyperlipidemia   .  LBBB (left bundle branch block)   . CKD (chronic kidney disease), stage III   . Long term current use of systemic steroids     a. For RA.  [name] Spinal stenosis   . Hip pain     a. adductor muscle tears on prior MRI.  [name] Chronic chest pain     a. Intolerant of Ranexa.  . Hyperkalemia     a. Spironolactone d/c'd due to this.  . Elevated hemidiaphragm   . Moderate mitral regurgitation 10/2014 echo  . MI (myocardial infarction)     "10/14/2014 I was told I'd had 2 MI's & didn't even know it" (11/27/2014)  . Pneumonia 10/14/2014  . Type II diabetes mellitus   . Arthritis     all over  . Rheumatoid arthritis   . Chronic back pain     "all up and down my back"    Current Outpatient  Prescriptions  Medication Sig Dispense Refill  . aspirin EC 81 MG tablet Take 81 mg by mouth daily.    . calcium carbonate (OS-CAL) 600 MG TABS Take 600 mg by mouth at bedtime.     . fluticasone (FLONASE) 50 MCG/ACT nasal spray Place 2 sprays into both nostrils daily.     . hydrALAZINE (APRESOLINE) 25 MG tablet Take 1 tablet (25 mg total) by mouth every 8 (eight) hours. 90 tablet 3  . Insulin Glargine (LANTUS SOLOSTAR) 100 UNIT/ML Solostar Pen Inject 30 Units into the skin daily. 15 mL 11  . isosorbide mononitrate (IMDUR) 60 MG 24 hr tablet Take 1 tablet (60 mg total) by mouth daily. 30 tablet 3  . levothyroxine (SYNTHROID, LEVOTHROID) 175 MCG tablet Take 175 mcg by mouth daily before breakfast.    . metoprolol succinate (TOPROL-XL) 50 MG 24 hr tablet Take 1 tablet (50 mg total) by mouth 3 (three) times daily. Take with or immediately following a meal. 90 tablet 11  . nitroGLYCERIN (NITROSTAT) 0.4 MG SL tablet Place 1 tablet (0.4 mg total) under the tongue every 5 (five) minutes as needed for chest pain. 100 tablet 12  . Omega-3 Fatty Acids (FISH OIL) 1000 MG CAPS Take 3,000 mg by mouth every morning.    [name] omeprazole (PRILOSEC) 20 MG capsule Take 20 mg by mouth daily.    [name] oxyCODONE-acetaminophen (PERCOCET/ROXICET) 5-325 MG per tablet Take 1-2 tablets by mouth every 4 (four) hours as needed for severe pain.    . polyethylene glycol (MIRALAX / GLYCOLAX) packet Take 17 g by mouth daily as needed. 14 each 0  . predniSONE (DELTASONE) 10 MG tablet Take 5 mg by mouth 2 (two) times daily with a meal.     . rosuvastatin (CRESTOR) 20 MG tablet Take 1 tablet (20 mg total) by mouth daily. 30 tablet 0  . saxagliptin HCl (ONGLYZA) 5 MG TABS tablet Take 5 mg by mouth daily.    [name] senna (SENOKOT) 8.6 MG TABS tablet Take 1 tablet (8.6 mg total) by mouth daily. 120 each 0  . torsemide (DEMADEX) 20 MG tablet Take 2 tablets (40 mg total) by mouth daily. 60 tablet 3  . triamcinolone cream (KENALOG) 0.1 % Apply 1  application topically as needed (for rash).     . warfarin (COUMADIN) 5 MG tablet Take 1-2 tablets by mouth daily as directed (Patient taking differently: Take 7.5-10 mg by mouth daily at 6 PM. TAKES 10MG  ON MON ONLY; TAKES 7.5MG  ALL OTHER DAYS) 135 tablet 2   No current facility-administered medications for this encounter.  Filed Vitals:   02/13/15 1059  BP: 116/60  Pulse: 77  Weight: 215 lb 8 oz (97.75 kg)  SpO2: 96%    PHYSICAL EXAM:  General:No resp difficulty, in wheelchair, wife and son present HEENT: normal Neck: supple. JVP 7-8. Carotids 2+ bilaterally; no bruits. No lymphadenopathy or thryomegaly appreciated. Cor: PMI normal. Heart irregular S1/S2, no S3/S4, 1/6 HSM at apex.  Lungs: Clear   Abdomen: soft, nontender, nondistended. No hepatosplenomegaly. No bruits or masses. Good bowel sounds. Extremities: no cyanosis, clubbing, rash,  R and LLE no edema Neuro: alert & orientedx3, cranial nerves grossly intact. Moves all 4 extremities w/o difficulty. Affect pleasant.  ASSESSMENT & PLAN: 1) Chronic systolic heart failure: EF 25% (01/2015), ischemic cardiomyopathy s/p CABG.  Patient recently admitted to the hospital for A/C systolic CHF with cardiogenic shock in the setting of NSTEMI. He had IABP placed and was diuresed with IV lasix and milrinone. He was weaned off milrinone.   Most recent ECHO EF 25% noted February 2016. I discussed ECHO findings and the recommendations for EP referral.  He is agreeable. Refer to EP for CRT-D has LBBB last QRS 134 msec.   NYHA IIIb symptoms with mild overload but he has not had torsemide today. . Continue current dose of torsemide 40 mg daily.   - Will continue Toprol XL 50 mg tid per Dr [name]. Continue hydralazine 25 mg TID and Imdur 60 mg daily.  Not on spiro or Ace due to hyperkalemia -- No digoxin due to history of digoxin toxicity.  - Reinforced the need and importance of daily weights, a low sodium diet, and fluid restriction (less than  2 L a day). Instructed to call the HF clinic if weight increases more than 3 lbs overnight or 5 lbs in a week.  2) CAD s/p CABG: Rare chest pain. Culprit for recent NSTEMI was likely occlusion of SVG-PDA.  There was no good target for intervention. No evidence of ischemia.  - Continue ASA and statin.  3) Dyspnea:  At his baseline SOB with exertion. Saw Dr Shelle Iron in late December with no additional recommendations.    4) Atrial fibrillation: Chronic. HR controlled, will continue Toprol and warfarin.   5) Immobility: Continue to try and be as active as possible.  Follow up in  1 month.  Richy Spradley 02/13/2015

## 2015-02-13 NOTE — Patient Instructions (Addendum)
You have been referred to EP, Dr Royann Shivers, 4/18 at 8:30  We will contact you in 3 months to schedule your next appointment.

## 2015-02-20 ENCOUNTER — Other Ambulatory Visit: Payer: Self-pay

## 2015-02-20 MED ORDER — HYDRALAZINE HCL 25 MG PO TABS: 25.0000 mg | ORAL_TABLET | Freq: Three times a day (TID) | ORAL | Status: DC

## 2015-02-20 MED ORDER — ISOSORBIDE MONONITRATE ER 60 MG PO TB24: 60.0000 mg | ORAL_TABLET | Freq: Every day | ORAL | Status: DC

## 2015-02-20 NOTE — Telephone Encounter (Signed)
Rx(s) sent to pharmacy electronically.  

## 2015-02-22 ENCOUNTER — Other Ambulatory Visit: Payer: Self-pay

## 2015-02-22 MED ORDER — METOPROLOL SUCCINATE ER 50 MG PO TB24: 50.0000 mg | ORAL_TABLET | Freq: Three times a day (TID) | ORAL | Status: DC

## 2015-02-22 MED ORDER — TORSEMIDE 20 MG PO TABS: 40.0000 mg | ORAL_TABLET | Freq: Every day | ORAL | Status: DC

## 2015-03-06 ENCOUNTER — Encounter: Payer: Self-pay | Admitting: Cardiovascular Disease

## 2015-03-06 ENCOUNTER — Ambulatory Visit (INDEPENDENT_AMBULATORY_CARE_PROVIDER_SITE_OTHER): Payer: Medicare Other | Admitting: Cardiovascular Disease

## 2015-03-06 ENCOUNTER — Ambulatory Visit (INDEPENDENT_AMBULATORY_CARE_PROVIDER_SITE_OTHER): Payer: Medicare Other | Admitting: Pharmacist Clinician (PhC)/ Clinical Pharmacy Specialist

## 2015-03-06 VITALS — BP 82/50 | HR 119 | Ht 71.0 in | Wt 213.0 lb

## 2015-03-06 DIAGNOSIS — I4891 Unspecified atrial fibrillation: Secondary | ICD-10-CM

## 2015-03-06 DIAGNOSIS — I482 Chronic atrial fibrillation: Secondary | ICD-10-CM | POA: Diagnosis not present

## 2015-03-06 DIAGNOSIS — I10 Essential (primary) hypertension: Secondary | ICD-10-CM

## 2015-03-06 DIAGNOSIS — Z7901 Long term (current) use of anticoagulants: Secondary | ICD-10-CM | POA: Diagnosis not present

## 2015-03-06 DIAGNOSIS — J986 Disorders of diaphragm: Secondary | ICD-10-CM

## 2015-03-06 DIAGNOSIS — I5023 Acute on chronic systolic (congestive) heart failure: Secondary | ICD-10-CM | POA: Diagnosis not present

## 2015-03-06 DIAGNOSIS — I4821 Permanent atrial fibrillation: Secondary | ICD-10-CM

## 2015-03-06 LAB — POCT INR: INR: 3.7

## 2015-03-06 NOTE — Progress Notes (Signed)
03/06/2015 [name]   06-Oct-1941  [id number]  Primary Physician Thayer Headings, MD Primary Cardiologist: Runell Gess MD [name]   HPI: [name] is a 74 year old mildly overweight married Caucasian male with a history of bypass grafting in 1998.I last saw him in the office one year ago. He is accompanied by his wife and son today. His last cath was 11/02/14 by Dr. Herbie Baltimore for an occluded vein graft to the PDA. An intra-aortic balloon pump was placed at that time. Plan is for medical therapy. He has an EF that ranges from 25% to as high as 55% although his most recent EF was 25% by 2-D echo. He has chronic AF and COPD. In March he was hypotensive and we had backed off on his medications. He did well for a week or so and then ended up in the hospital in congestive heart failure. He was diuresed and we saw him in followup on March 16, 2013, and he was doing better. It appears that a good weight for him is approximately 191 pounds. We gave is family instructions to give him an extra 1/2 of Lasix if his weight went up 3 pounds. If it was still up 3 pounds the next day they were to give an extra whole Lasix.  He has several complaints today 1-this week and he was very weak seemed somewhat confused at times but over the last 2 days this has improved and today he is back to his baseline. He does complain of chronic shortness of breath. He continues to complain of anorexia. His wife says he doesn't eat much and has no energy or stamina. His wgt has been stable. On his last visit he was 182 pounds and today he is 196, got home his weight is 183. He has not had vomiting or diarrhea. He denies abdominal pain. He recently had labs at his primary care office that showed Triglycerides > 800. He was taken off statins in Jan secondary to elevated LFTs. Lipid panel was again drawn today.  he has been on digoxin the past which was discontinued because of side effects. He was hospitalized  in November and December of last year. He did have a cardiac catheterization by Dr. Herbie Baltimore and occluded vein graft to PDA. Medical therapy was recommended. He did have elevated filling pressures and a balloon pump pump was placed. He has chronic A. Fib on Coumadin anticoagulation. His ejection fraction was measured at 25%. He has class 3-4 heart failure. He scheduled to see Dr. Royann Shivers in the office next month to discuss ICD therapy and possibly for a biventricular device.   Current Outpatient Prescriptions  Medication Sig Dispense Refill  . aspirin EC 81 MG tablet Take 81 mg by mouth daily.    . calcium carbonate (OS-CAL) 600 MG TABS Take 600 mg by mouth at bedtime.     . Diclofenac Sodium 3 % GEL Place onto the skin as needed.    . fluticasone (FLONASE) 50 MCG/ACT nasal spray Place 2 sprays into both nostrils daily.     . hydrALAZINE (APRESOLINE) 25 MG tablet Take 1 tablet (25 mg total) by mouth every 8 (eight) hours. 270 tablet 3  . isosorbide mononitrate (IMDUR) 60 MG 24 hr tablet Take 1 tablet (60 mg total) by mouth daily. 90 tablet 3  . levothyroxine (SYNTHROID, LEVOTHROID) 175 MCG tablet Take 175 mcg by mouth daily before breakfast.    . metoprolol succinate (TOPROL-XL) 50 MG 24 hr tablet Take  1 tablet (50 mg total) by mouth 3 (three) times daily. Take with or immediately following a meal. 270 tablet 1  . nitroGLYCERIN (NITROSTAT) 0.4 MG SL tablet Place 1 tablet (0.4 mg total) under the tongue every 5 (five) minutes as needed for chest pain. 100 tablet 12  . Omega-3 Fatty Acids (FISH OIL) 1000 MG CAPS Take 3,000 mg by mouth every morning.    [name] omeprazole (PRILOSEC) 20 MG capsule Take 20 mg by mouth daily.    [name] oxyCODONE-acetaminophen (PERCOCET/ROXICET) 5-325 MG per tablet Take 1-2 tablets by mouth every 4 (four) hours as needed for severe pain.    . polyethylene glycol (MIRALAX / GLYCOLAX) packet Take 17 g by mouth daily as needed. 14 each 0  . predniSONE (DELTASONE) 10 MG tablet Take 5 mg  by mouth 2 (two) times daily with a meal.     . rosuvastatin (CRESTOR) 20 MG tablet Take 1 tablet (20 mg total) by mouth daily. 30 tablet 0  . saxagliptin HCl (ONGLYZA) 5 MG TABS tablet Take 5 mg by mouth daily.    [name] senna (SENOKOT) 8.6 MG TABS tablet Take 1 tablet (8.6 mg total) by mouth daily. 120 each 0  . torsemide (DEMADEX) 20 MG tablet Take 2 tablets (40 mg total) by mouth daily. 180 tablet 1  . TOUJEO SOLOSTAR 300 UNIT/ML SOPN 32 Units every morning.   4  . triamcinolone cream (KENALOG) 0.1 % Apply 1 application topically as needed (for rash).     . warfarin (COUMADIN) 5 MG tablet Take 1-2 tablets by mouth daily as directed (Patient taking differently: Take 7.5-10 mg by mouth daily at 6 PM. TAKES 10MG  ON MON ONLY; TAKES 7.5MG  ALL OTHER DAYS) 135 tablet 2   No current facility-administered medications for this visit.    Allergies  Allergen Reactions  . Butrans [Buprenorphine] Shortness Of Breath  . Influenza Vaccines Other (See Comments)    Severe coughing for weeks after vaccine; also had flu like symptoms for about 7 to 8 weeks.   . Lanoxin [Digoxin] Other (See Comments)    Nausea, anorexia, caused tremendous weight loss  . Ranexa [Ranolazine Er] Other (See Comments)    TOLD NEVER TO TAKE MED-PER MD  . Lisinopril Other (See Comments)    cough  . Clarithromycin Other (See Comments)    Unknown, thinks "blisters in mouth"  . Statins Other (See Comments)    unknown    History   Social History  . Marital Status: Married    Spouse Name: N/A  . Number of Children: N/A  . Years of Education: N/A   Occupational History  . Not on file.   Social History Main Topics  . Smoking status: Never Smoker   . Smokeless tobacco: Never Used  . Alcohol Use: No  . Drug Use: No  . Sexual Activity: Not Currently   Other Topics Concern  . Not on file   Social History Narrative     Review of Systems: General: negative for chills, fever, night sweats or weight changes.    Cardiovascular: negative for chest pain, dyspnea on exertion, edema, orthopnea, palpitations, paroxysmal nocturnal dyspnea or shortness of breath Dermatological: negative for rash Respiratory: negative for cough or wheezing Urologic: negative for hematuria Abdominal: negative for nausea, vomiting, diarrhea, bright red blood per rectum, melena, or hematemesis Neurologic: negative for visual changes, syncope, or dizziness All other systems reviewed and are otherwise negative except as noted above.    Blood pressure 82/50, pulse 119, height  5\' 11"  (1.803 m), weight 213 lb (96.616 kg).  General appearance: alert and mild distress Neck: no adenopathy, no carotid bruit, no JVD, supple, symmetrical, trachea midline and thyroid not enlarged, symmetric, no tenderness/mass/nodules Lungs: clear to auscultation bilaterally Heart: irregularly irregular rhythm Extremities: extremities normal, atraumatic, no cyanosis or edema  EKG atrial fibrillation with a ventricular response of 119, nonspecific IVCD with left axis deviation. 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 His pulmonologist is Dr. 1995 .   Essential hypertension History of hypertension blood pressure measured today at 82/50. He is on hydralazine, metoprolol. Continue current meds at current dosing   DYSLIPIDEMIA History of hyperlipidemia on Crestor 20 mg a day followed by his PCP   Atrial fibrillation, permanent History of chronic atrophic fibrillation on Coumadin at the coagulation followed here in our office. He is on metoprolol for rate control although his heart rate today is in the low 100 range.   Acute on chronic systolic CHF (congestive heart failure) History of CAD status post coronary artery bypass grafting in 1998. He had a recent cardiac catheterization performed by Dr. 1999 on November 3 of  last year revealing an occluded vein graft to the PDA. His filling pressures were elevated. He did have a intra-aortic balloon pump placed. His ejection fraction 25% by 2-D echo. He is followed in the heart failure clinic. He is on appropriate heart failure medications. We talked about ICD therapy and potential for CRT. His QRS duration is 132 ms. He scheduled to see Dr. 06-03-1991 in the office next month.       Royann Shivers MD FACP,FACC,FAHA, [facility] 03/06/2015 11:06 AM

## 2015-03-06 NOTE — Assessment & Plan Note (Signed)
History of hyperlipidemia on Crestor 20 mg a day followed by his PCP 

## 2015-03-06 NOTE — Assessment & Plan Note (Signed)
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 His pulmonologist is Dr. Shelle Iron .

## 2015-03-06 NOTE — Patient Instructions (Signed)
We request that you follow-up in: 3 months with an extender and in 6 months with Dr Berry  You will receive a reminder letter in the mail two months in advance. If you don't receive a letter, please call our office to schedule the follow-up appointment.   

## 2015-03-06 NOTE — Assessment & Plan Note (Signed)
History of CAD status post coronary artery bypass grafting in 1998. He had a recent cardiac catheterization performed by Dr. Herbie Baltimore on November 3 of last year revealing an occluded vein graft to the PDA. His filling pressures were elevated. He did have a intra-aortic balloon pump placed. His ejection fraction 25% by 2-D echo. He is followed in the heart failure clinic. He is on appropriate heart failure medications. We talked about ICD therapy and potential for CRT. His QRS duration is 132 ms. He scheduled to see Dr. Royann Shivers in the office next month.

## 2015-03-06 NOTE — Assessment & Plan Note (Signed)
History of chronic atrophic fibrillation on Coumadin at the coagulation followed here in our office. He is on metoprolol for rate control although his heart rate today is in the low 100 range.

## 2015-03-06 NOTE — Assessment & Plan Note (Signed)
History of hypertension blood pressure measured today at 82/50. He is on hydralazine, metoprolol. Continue current meds at current dosing

## 2015-03-20 ENCOUNTER — Ambulatory Visit (INDEPENDENT_AMBULATORY_CARE_PROVIDER_SITE_OTHER): Payer: Medicare Other | Admitting: Pharmacist Clinician (PhC)/ Clinical Pharmacy Specialist

## 2015-03-20 DIAGNOSIS — I4821 Permanent atrial fibrillation: Secondary | ICD-10-CM

## 2015-03-20 DIAGNOSIS — I482 Chronic atrial fibrillation: Secondary | ICD-10-CM

## 2015-03-20 DIAGNOSIS — Z7901 Long term (current) use of anticoagulants: Secondary | ICD-10-CM

## 2015-03-20 LAB — POCT INR: INR: 3.6

## 2015-03-21 ENCOUNTER — Other Ambulatory Visit: Payer: Self-pay | Admitting: Cardiovascular Disease

## 2015-03-22 ENCOUNTER — Ambulatory Visit (HOSPITAL_COMMUNITY)
Admission: RE | Admit: 2015-03-22 | Discharge: 2015-03-22 | Disposition: A | Discharge status: Home / Self Care | Payer: Medicare Other | Source: Ambulatory Visit | Attending: Cardiology | Admitting: Cardiology

## 2015-03-22 VITALS — BP 136/58 | HR 86 | Wt 217.0 lb

## 2015-03-22 DIAGNOSIS — Z7982 Long term (current) use of aspirin: Secondary | ICD-10-CM | POA: Insufficient documentation

## 2015-03-22 DIAGNOSIS — E785 Hyperlipidemia, unspecified: Secondary | ICD-10-CM | POA: Insufficient documentation

## 2015-03-22 DIAGNOSIS — I255 Ischemic cardiomyopathy: Secondary | ICD-10-CM | POA: Diagnosis not present

## 2015-03-22 DIAGNOSIS — I4821 Permanent atrial fibrillation: Secondary | ICD-10-CM

## 2015-03-22 DIAGNOSIS — Z7952 Long term (current) use of systemic steroids: Secondary | ICD-10-CM | POA: Insufficient documentation

## 2015-03-22 DIAGNOSIS — I252 Old myocardial infarction: Secondary | ICD-10-CM | POA: Insufficient documentation

## 2015-03-22 DIAGNOSIS — I129 Hypertensive chronic kidney disease with stage 1 through stage 4 chronic kidney disease, or unspecified chronic kidney disease: Secondary | ICD-10-CM | POA: Diagnosis not present

## 2015-03-22 DIAGNOSIS — Z7901 Long term (current) use of anticoagulants: Secondary | ICD-10-CM | POA: Diagnosis not present

## 2015-03-22 DIAGNOSIS — I482 Chronic atrial fibrillation: Secondary | ICD-10-CM | POA: Insufficient documentation

## 2015-03-22 DIAGNOSIS — I25810 Atherosclerosis of coronary artery bypass graft(s) without angina pectoris: Secondary | ICD-10-CM | POA: Diagnosis not present

## 2015-03-22 DIAGNOSIS — M069 Rheumatoid arthritis, unspecified: Secondary | ICD-10-CM | POA: Diagnosis not present

## 2015-03-22 DIAGNOSIS — I2510 Atherosclerotic heart disease of native coronary artery without angina pectoris: Secondary | ICD-10-CM | POA: Diagnosis not present

## 2015-03-22 DIAGNOSIS — R0600 Dyspnea, unspecified: Secondary | ICD-10-CM | POA: Insufficient documentation

## 2015-03-22 DIAGNOSIS — K219 Gastro-esophageal reflux disease without esophagitis: Secondary | ICD-10-CM | POA: Insufficient documentation

## 2015-03-22 DIAGNOSIS — E039 Hypothyroidism, unspecified: Secondary | ICD-10-CM | POA: Insufficient documentation

## 2015-03-22 DIAGNOSIS — E119 Type 2 diabetes mellitus without complications: Secondary | ICD-10-CM | POA: Diagnosis not present

## 2015-03-22 DIAGNOSIS — Z79899 Other long term (current) drug therapy: Secondary | ICD-10-CM | POA: Insufficient documentation

## 2015-03-22 DIAGNOSIS — N183 Chronic kidney disease, stage 3 (moderate): Secondary | ICD-10-CM | POA: Insufficient documentation

## 2015-03-22 DIAGNOSIS — I447 Left bundle-branch block, unspecified: Secondary | ICD-10-CM | POA: Insufficient documentation

## 2015-03-22 DIAGNOSIS — I5022 Chronic systolic (congestive) heart failure: Secondary | ICD-10-CM | POA: Diagnosis not present

## 2015-03-22 DIAGNOSIS — Z951 Presence of aortocoronary bypass graft: Secondary | ICD-10-CM | POA: Diagnosis not present

## 2015-03-22 LAB — BASIC METABOLIC PANEL
Anion gap: 8 (ref 5–15)
BUN: 20 mg/dL (ref 6–23)
CO2: 26 mmol/L (ref 19–32)
CREATININE: 1.59 mg/dL — AB (ref 0.50–1.35)
Calcium: 9.2 mg/dL (ref 8.4–10.5)
Chloride: 100 mmol/L (ref 96–112)
GFR calc Af Amer: 47 mL/min — ABNORMAL LOW (ref >90)
GFR calc non Af Amer: 41 mL/min — ABNORMAL LOW (ref >90)
Glucose, Bld: 216 mg/dL — ABNORMAL HIGH (ref 70–99)
Potassium: 5 mmol/L (ref 3.5–5.1)
Sodium: 134 mmol/L — ABNORMAL LOW (ref 135–145)

## 2015-03-22 MED ORDER — METOPROLOL SUCCINATE ER 100 MG PO TB24: 100.0000 mg | ORAL_TABLET | Freq: Two times a day (BID) | ORAL | Status: DC

## 2015-03-22 MED ORDER — ISOSORBIDE MONONITRATE ER 60 MG PO TB24: 90.0000 mg | ORAL_TABLET | Freq: Every day | ORAL | Status: DC

## 2015-03-22 NOTE — Patient Instructions (Signed)
Change Metoprolol to 100 mg Twice daily, you can take 2 of your 50 mg tabs until you run out then we have sent you in a new prescription for 100 mg tablets  Increase Imdur (Isosorbide) to 90 mg (1 and 1/2 tabs) daily  Lab today  Your physician recommends that you schedule a follow-up appointment in: 1 month

## 2015-03-24 NOTE — Progress Notes (Signed)
Patient ID: [name], male   DOB: Dec 19, 1940, 74 y.o.   MRN: [id number] PCP: Dr. Thayer Headings Primary Cardiologist: Dr. [name]  HPI: [name] is a 74 yo male with a history of HTN, DM2, HLD, CAD s/p CABG 1998, ischemic cardiomyopathy, chronic angina, chronic atrial fibrillation, LBBB, rheumatoid arthritis on chronic steroids, and severe spinal stenosis.  He has a chronically elevated left hemidiaphragm.  He has left hip pain with adductor muscle tears on prior MRI.   In 11/15 had prolonged hospitalization with cardiogenic shock requiring IABP and milrinone. Suspect occlusion of SVG-PDA prior to this hospitalization.Last admitted to [facility] in 12/15 with chest pain. Cardiac enzymes negative. Chest discomfort thought to be from upper respiratory infection and not heart failure. Discharge weight 199 pounds.    He returns for follow up. He has been struggling with bronchitis recently and is on doxycycline.  He has quite significant musculoskeletal disability.  He is walking only very short distances and has been like this for a long time. He has back pain and leg weakness/pain that are very limiting.  He has been having central chest tightness with walking just short distances (20-25 feet).  This has been present since his 11/15 admission where the SVG-PDA was found to be occluded. He has been using NTG frequently.  He is dyspneic also with walking short distances.   Labs (11/15): K 5.3, creatinine 1.98, BNP 15184 => 5069, hemoglobin 13.6  Labs (1/16): BNP 4338 Labs (01/16/2015): K 4.0 Creatinine 1.36          Studies: Echo (10/15/14): EF 25%, mod calcified annulus mitral valve, mod MR, RV sys fx mildly reduced LHC (10/16/14): patent LIMA-LAD and SVG to OM, TO SVG-PDA with TO PDA and 60% proximal RCA, some left =>PDA collaterals;  RHC (10/16/14): RA 20, RV 69/11, PA 69/36 (35), PCWP 35, PA sat: 46% on 100% NRB, Fick CO/CI: 3.15/1.43, Thermo CO/CI: 3.61/1.63 ECG (11/15): atrial fibrillation with LBBB,  QRS 132 msec.  ECHO (01/2015): EF 25-30%, mild MR.   ROS: All systems negative except as listed in HPI, PMH and Problem List.  SH:  History   Social History  . Marital Status: Married    Spouse Name: N/A  . Number of Children: N/A  . Years of Education: N/A   Occupational History  . Not on file.   Social History Main Topics  . Smoking status: Never Smoker   . Smokeless tobacco: Never Used  . Alcohol Use: No  . Drug Use: No  . Sexual Activity: Not Currently   Other Topics Concern  . Not on file   Social History Narrative    FH:  Family History  Problem Relation Age of Onset  . Heart disease Mother   . Heart disease Father   . Heart disease Brother   . Heart disease Maternal Aunt   . Cancer      Past Medical History  Diagnosis Date  . Pleuritis     h/o------ with negative vats biopsy  . Coronary artery disease     a. CABG 1998. b. NSTEMI 02/2013: Progression of dz in SVG-RPDA followed by severe stenosis in grafted vessel, not likely safely reacted for PCI - med rx. c. NSTEMI 10/2014: patent LIMA-LAD and SVG to OM, TO SVG-PDA with TO PDA and 60% proximal RCA, some left =>PDA collaterals; culprit was likely occlusion of SVG-PDA though this may have been subacute given some collaterals.  . 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  Chronic anticoagulation, on coumadin 01/12/2013  . Cardiomyopathy, ischemic EF 25%   . Hypothyroidism   . GERD (gastroesophageal reflux disease)   . Chronic atrial fibrillation   . Digitalis toxicity   . Chronic systolic CHF (congestive heart failure)     a. EF prev 25%, improved to 45-50% in 2014. b. 10/2014: EF back downt o 25%.  [name] Hyperlipidemia   . LBBB (left bundle branch block)   . CKD (chronic kidney disease), stage III   . Long term current use of systemic steroids     a. For RA.  [name] Spinal stenosis   . Hip pain     a. adductor muscle tears on prior MRI.  [name] Chronic chest pain     a.  Intolerant of Ranexa.  . Hyperkalemia     a. Spironolactone d/c'd due to this.  . Elevated hemidiaphragm   . Moderate mitral regurgitation 10/2014 echo  . MI (myocardial infarction)     "10/14/2014 I was told I'd had 2 MI's & didn't even know it" (11/27/2014)  . Pneumonia 10/14/2014  . Type II diabetes mellitus   . Arthritis     all over  . Rheumatoid arthritis   . Chronic back pain     "all up and down my back"  . 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 aspirin EC 81 MG tablet Take 81 mg by mouth daily.    . calcium carbonate (OS-CAL) 600 MG TABS Take 600 mg by mouth at bedtime.     . Diclofenac Sodium 3 % GEL Place onto the skin as needed.    . doxycycline (VIBRA-TABS) 100 MG tablet Take 100 mg by mouth 2 (two) times daily.    . fluticasone (FLONASE) 50 MCG/ACT nasal spray Place 2 sprays into both nostrils daily.     . hydrALAZINE (APRESOLINE) 25 MG tablet Take 1 tablet (25 mg total) by mouth every 8 (eight) hours. 270 tablet 3  . isosorbide mononitrate (IMDUR) 60 MG 24 hr tablet Take 1.5 tablets (90 mg total) by mouth daily. 45 tablet 3  . levothyroxine (SYNTHROID, LEVOTHROID) 175 MCG tablet Take 175 mcg by mouth daily before breakfast.    . metoprolol succinate (TOPROL-XL) 100 MG 24 hr tablet Take 1 tablet (100 mg total) by mouth 2 (two) times daily. Take with or immediately following a meal. 60 tablet 3  . NITROSTAT 0.4 MG SL tablet PLACE 1 TABLET (0.4 MG TOTAL) UNDER THE TONGUE EVERY 5 (FIVE) MINUTES AS NEEDED FOR CHEST PAIN. 100 tablet 1  . Omega-3 Fatty Acids (FISH OIL) 1000 MG CAPS Take 3,000 mg by mouth every morning.    [name] omeprazole (PRILOSEC) 20 MG capsule Take 20 mg by mouth daily.    [name] oxyCODONE-acetaminophen (PERCOCET/ROXICET) 5-325 MG per tablet Take 1-2 tablets by mouth every 4 (four) hours as needed for severe pain.    . polyethylene glycol (MIRALAX /  GLYCOLAX) packet Take 17 g by mouth daily as needed. 14 each 0  . predniSONE (DELTASONE) 10 MG tablet Take 5 mg by mouth 2 (two) times daily with a meal.     . rosuvastatin (CRESTOR) 20 MG tablet Take 1 tablet (20 mg total) by mouth daily. 30 tablet 0  . saxagliptin HCl (ONGLYZA) 5 MG TABS tablet Take 5 mg by mouth daily.    [name] senna (SENOKOT) 8.6 MG TABS tablet  Take 1 tablet (8.6 mg total) by mouth daily. 120 each 0  . torsemide (DEMADEX) 20 MG tablet Take 2 tablets (40 mg total) by mouth daily. 180 tablet 1  . TOUJEO SOLOSTAR 300 UNIT/ML SOPN 32 Units every morning.   4  . triamcinolone cream (KENALOG) 0.1 % Apply 1 application topically as needed (for rash).     . warfarin (COUMADIN) 5 MG tablet Take 1-2 tablets by mouth daily as directed (Patient taking differently: Take 7.5-10 mg by mouth daily at 6 PM. TAKES 10MG  ON MON ONLY; TAKES 7.5MG  ALL OTHER DAYS) 135 tablet 2   No current facility-administered medications for this encounter.    Filed Vitals:   03/22/15 1112  BP: 136/58  Pulse: 86  Weight: 217 lb (98.431 kg)  SpO2: 95%    PHYSICAL EXAM: General:No resp difficulty, in wheelchair, wife and son present HEENT: normal Neck: supple. JVP 7. Carotids 2+ bilaterally; no bruits. No lymphadenopathy or thryomegaly appreciated. Cor: PMI normal. Heart irregular S1/S2, no S3/S4, 1/6 HSM at apex.  Lungs: Clear   Abdomen: soft, nontender, nondistended. No hepatosplenomegaly. No bruits or masses. Good bowel sounds. Extremities: no cyanosis, clubbing, rash.  Trace ankle edema bilaterally.  Neuro: alert & orientedx3, cranial nerves grossly intact. Moves all 4 extremities w/o difficulty. Affect pleasant.  ASSESSMENT & PLAN: 1) Chronic systolic heart failure: EF 25% (01/2015), ischemic cardiomyopathy s/p CABG.  Patient was admitted to the hospital in 11/15 for acute/chronic systolic CHF with cardiogenic shock in the setting of NSTEMI. He had IABP placed and was diuresed with IV lasix and  milrinone. He was weaned off milrinone.  He is very limited by orthopedic problems as well as chest pain and does very little walking. NYHA class IIIb symptoms, not just due to CHF (also orthopedic-related).  Volume looks ok today.  - Continue torsemide at current dose. BMET today.  - Increase Toprol XL to 100 mg bid (this may help with angina) - Continue hydralazine, increase Imdur to 90 mg daily.  - Not on spironolactone or ACEI due to hyperkalemia.  - No digoxin due to history of digoxin toxicity.  - He is being considered for CRT-D and will be seeing Dr 12/15.  I am not sure that he is a very good ICD candidate given his current quality of life (not very mobile at all, and this is not only due to cardiac issues).  In terms of CRT, his QRS is not markedly wide (only 132 msec) so chance of response is not as high.  He is also in atrial fibrillation chronically, which will decrease the efficacy of CRT.  He and his wife and going to think about ICD, but he seems to be leaning against it.  - Reinforced the need and importance of daily weights, a low sodium diet, and fluid restriction (less than 2 L a day). Instructed to call the HF clinic if weight increases more than 3 lbs overnight or 5 lbs in a week.  2) CAD s/p CABG: Culprit for NSTEMI in 11/15 was likely occlusion of SVG-PDA.  There was no good target for intervention. Since 11/15, he has been having exertional chest pain with relatively minimal exertion (25-30 feet).  - Continue ASA and statin.  - Continue medical management of angina: As above, will increase Imdur and Toprol XL.  He says that he was taken off ranolazine in the past and is not sure why.  I cannot find any records of this, will ask Dr 12/15 (ranolazine may  be a helpful agent for him).  3) Dyspnea:  Probably due to combination of deconditioning, systolic CHF, and elevated left hemidiaphragm.  He does not have significant COPD, and he did not have interstitial lung disease on last  chest CT.     4) Atrial fibrillation: Chronic. HR controlled, will continue Toprol and warfarin.   5) Immobility: Continue to try and be as active as possible.  Follow up in 1 month.  Marca Ancona 03/24/2015

## 2015-04-01 ENCOUNTER — Ambulatory Visit (INDEPENDENT_AMBULATORY_CARE_PROVIDER_SITE_OTHER): Payer: Medicare Other | Admitting: Cardiovascular Disease

## 2015-04-01 ENCOUNTER — Encounter: Payer: Self-pay | Admitting: Cardiovascular Disease

## 2015-04-01 ENCOUNTER — Ambulatory Visit (INDEPENDENT_AMBULATORY_CARE_PROVIDER_SITE_OTHER): Payer: Medicare Other | Admitting: Pharmacist Clinician (PhC)/ Clinical Pharmacy Specialist

## 2015-04-01 VITALS — BP 100/62 | HR 64 | Ht 72.0 in | Wt 218.4 lb

## 2015-04-01 DIAGNOSIS — Z7901 Long term (current) use of anticoagulants: Secondary | ICD-10-CM | POA: Diagnosis not present

## 2015-04-01 DIAGNOSIS — I482 Chronic atrial fibrillation: Secondary | ICD-10-CM

## 2015-04-01 DIAGNOSIS — I25810 Atherosclerosis of coronary artery bypass graft(s) without angina pectoris: Secondary | ICD-10-CM

## 2015-04-01 DIAGNOSIS — I4821 Permanent atrial fibrillation: Secondary | ICD-10-CM

## 2015-04-01 DIAGNOSIS — I447 Left bundle-branch block, unspecified: Secondary | ICD-10-CM

## 2015-04-01 DIAGNOSIS — I5022 Chronic systolic (congestive) heart failure: Secondary | ICD-10-CM | POA: Diagnosis not present

## 2015-04-01 DIAGNOSIS — I255 Ischemic cardiomyopathy: Secondary | ICD-10-CM | POA: Diagnosis not present

## 2015-04-01 LAB — POCT INR: INR: 2.9

## 2015-04-01 NOTE — Patient Instructions (Signed)
A referral has been placed for an Appointment with Dr. Lewayne Bunting to discuss implantation of a BIV ICD

## 2015-04-01 NOTE — Progress Notes (Signed)
Patient ID: [name], male   DOB: December 21, 1940, 74 y.o.   MRN: [id number]     Cardiology Office Note   Date:  04/01/2015   ID:  [name], DOB Feb 06, 1941, MRN [id number]  PCP:  Thayer Headings, MD  Cardiologist:  Nanetta Batty, MD  Thurmon Fair, MD   Chief Complaint  Patient presents with  . Evaluation    for defibrillator insertion, c/o shortness of breath-paralyzed diaphragm      History of Present Illness: [name] is a 74 y.o. male referred by Dr. [name] for discussion of ICD therapy. Also followed in CHF clinic by Dr. [name].  History of CAD status post coronary artery bypass grafting in 1998. He had a recent cardiac catheterization performed by Dr. Herbie Baltimore on November 3 of last year revealing an occluded vein graft to the PDA. His filling pressures were elevated. He did have a intra-aortic balloon pump placed. His ejection fraction 25% by 2-D echo. He has had frequent episodes of HF exacerbation and is followed in the heart failure clinic. He is on appropriate heart failure medications. Dr. [name] and Dr. [name] have talked about ICD therapy and potential for CRT. His QRS duration is 132 ms. He has been on appropriate CHF meds. Has functional limitations related to back pain, COPD and paralyzed left hemidiaphragm in addition to CHF. He has permanent atrial fibrillation, currently with excellent rate control.  Despite limited functional capacity, he is very engaged in family life and wants to take his great grandchildren fishing. He would like to live several more years to watch them grow up.    Past Medical History  Diagnosis Date  . Pleuritis     h/o------ with negative vats biopsy  . Coronary artery disease     a. CABG 1998. b. NSTEMI 02/2013: Progression of dz in SVG-RPDA followed by severe stenosis in grafted vessel, not likely safely reacted for PCI - med rx. c. NSTEMI 10/2014: patent LIMA-LAD and SVG to OM, TO SVG-PDA with TO PDA and 60% proximal RCA,  some left =>PDA collaterals; culprit was likely occlusion of SVG-PDA though this may have been subacute given some collaterals.  . 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 Chronic anticoagulation, on coumadin 01/12/2013  . Cardiomyopathy, ischemic EF 25%   . Hypothyroidism   . GERD (gastroesophageal reflux disease)   . Chronic atrial fibrillation   . Digitalis toxicity   . Chronic systolic CHF (congestive heart failure)     a. EF prev 25%, improved to 45-50% in 2014. b. 10/2014: EF back downt o 25%.  [name] Hyperlipidemia   . LBBB (left bundle branch block)   . CKD (chronic kidney disease), stage III   . Long term current use of systemic steroids     a. For RA.  [name] Spinal stenosis   . Hip pain     a. adductor muscle tears on prior MRI.  [name] Chronic chest pain     a. Intolerant of Ranexa.  . Hyperkalemia     a. Spironolactone d/c'd due to this.  . Elevated hemidiaphragm   . Moderate mitral regurgitation 10/2014 echo  . MI (myocardial infarction)     "10/14/2014 I was told I'd had 2 MI's & didn't even know it" (11/27/2014)  . Pneumonia 10/14/2014  . Type II diabetes mellitus   . Arthritis     all over  . Rheumatoid arthritis   . Chronic back pain     "all up and  down my back"  . 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 Lung surgery  1994    "infection in lungs; scraped it out on right; 6 wks later they did left; clipped left diaphragm & paralyzed it"  . Total knee arthroplasty Bilateral 2000's  . Carpal tunnel release Bilateral 1990's  . Cataract extraction w/ intraocular lens  implant, bilateral Bilateral 1990's  . Cardiac catheterization Left 01/11/2013    Severe CAD with notable progression of disease in the SVG-RPDA followed by severe stenosis in grafted vessel, region not safely "reachede" for PCI, moderate ischemic CM no notable chage in  EF, continue medical therapy  . Cardiac catheterization  04/03/2011    Left circumflex 95% stenosis, essentially unchanged from last cath  . Cardiac catheterization  04/01/2010    LAD 99% proximal stenosis with vague flow, circumflex 80% stenosis, essentially unchanged form last cath  . Cardiac catheterization  03/28/2010    Mild pulmonary hypertension, medical therapy  . Cardiac catheterization  04/30/2009    LAD 95% stenosis, essentially unchanged fomr last cath, medical therapy  . Cardiac catheterization  12/05/2008    2.5x12 apex, predilation in the distal PLA branch, 3x23 XIENCE V durg eluting stent and post dilated with a 3.25x20 [place] VOYAGER at 15 atmospheres  . Left heart catheterization with coronary/graft angiogram N/A 01/13/2013    Procedure: LEFT HEART CATHETERIZATION WITH Isabel Caprice;  Surgeon: Marykay Lex, MD;  Location: [facility] CATH LAB;  Service: Cardiovascular;  Laterality: N/A;  . Left and right heart catheterization with coronary/graft angiogram N/A 10/16/2014    Procedure: LEFT AND RIGHT HEART CATHETERIZATION WITH Isabel Caprice;  Surgeon: Marykay Lex, MD;  Location: [facility] CATH LAB;  Service: Cardiovascular;  Laterality: N/A;  . Coronary artery bypass graft  05/1997    "CABG X7"  . Patella fracture surgery Right "before replacement"  . Knee arthroscopy Left "before replacement     Current Outpatient Prescriptions  Medication Sig Dispense Refill  . aspirin EC 81 MG tablet Take 81 mg by mouth daily.    . calcium carbonate (OS-CAL) 600 MG TABS Take 600 mg by mouth at bedtime.     . Diclofenac Sodium 3 % GEL Place onto the skin as needed.    . doxycycline (VIBRA-TABS) 100 MG tablet Take 100 mg by mouth 2 (two) times daily.    . fluticasone (FLONASE) 50 MCG/ACT nasal spray Place 2 sprays into both nostrils daily.     . hydrALAZINE (APRESOLINE) 25 MG tablet Take 1 tablet (25 mg total) by mouth every 8 (eight) hours. 270 tablet 3  . isosorbide mononitrate  (IMDUR) 60 MG 24 hr tablet Take 1.5 tablets (90 mg total) by mouth daily. 45 tablet 3  . levothyroxine (SYNTHROID, LEVOTHROID) 175 MCG tablet Take 175 mcg by mouth daily before breakfast.    . metoprolol succinate (TOPROL-XL) 100 MG 24 hr tablet Take 1 tablet (100 mg total) by mouth 2 (two) times daily. Take with or immediately following a meal. 60 tablet 3  . NITROSTAT 0.4 MG SL tablet PLACE 1 TABLET (0.4 MG TOTAL) UNDER THE TONGUE EVERY 5 (FIVE) MINUTES AS NEEDED FOR CHEST PAIN. 100 tablet 1  . nystatin cream (MYCOSTATIN) Apply 1 application topically daily as needed.  4  . Omega-3 Fatty Acids (FISH OIL) 1000 MG CAPS Take 3,000 mg by mouth every morning.    [name] omeprazole (  PRILOSEC) 20 MG capsule Take 20 mg by mouth daily.    [name] oxyCODONE-acetaminophen (PERCOCET/ROXICET) 5-325 MG per tablet Take 1-2 tablets by mouth every 4 (four) hours as needed for severe pain.    . polyethylene glycol (MIRALAX / GLYCOLAX) packet Take 17 g by mouth daily as needed. 14 each 0  . predniSONE (DELTASONE) 10 MG tablet Take 5 mg by mouth 2 (two) times daily with a meal.     . rosuvastatin (CRESTOR) 20 MG tablet Take 1 tablet (20 mg total) by mouth daily. 30 tablet 0  . saxagliptin HCl (ONGLYZA) 5 MG TABS tablet Take 5 mg by mouth daily.    [name] senna (SENOKOT) 8.6 MG TABS tablet Take 1 tablet (8.6 mg total) by mouth daily. 120 each 0  . torsemide (DEMADEX) 20 MG tablet Take 2 tablets (40 mg total) by mouth daily. 180 tablet 1  . TOUJEO SOLOSTAR 300 UNIT/ML SOPN 32 Units every morning.   4  . triamcinolone cream (KENALOG) 0.1 % Apply 1 application topically as needed (for rash).     . warfarin (COUMADIN) 5 MG tablet Take 1-2 tablets by mouth daily as directed (Patient taking differently: Take 7.5-10 mg by mouth daily at 6 PM. TAKES 10MG  ON MON ONLY; TAKES 7.5MG  ALL OTHER DAYS) 135 tablet 2   No current facility-administered medications for this visit.    Allergies:   Butrans; Influenza vaccines; Lanoxin; Ranexa;  Lisinopril; Clarithromycin; and Statins    Social History:  The patient  reports that he has never smoked. He has never used smokeless tobacco. He reports that he does not drink alcohol or use illicit drugs.   Family History:  The patient's family history includes Cancer in an other family member; Heart disease in his brother, father, maternal aunt, and mother.    ROS:  Please see the history of present illness.    Otherwise, review of systems positive for chronic back pain and class IIIb exertional dyspnea. No angina or syncope.   All other systems are reviewed and negative.    PHYSICAL EXAM: VS:  BP 100/62 mmHg  Pulse 64  Ht 6' (1.829 m)  Wt 218 lb 6.4 oz (99.066 kg)  BMI 29.61 kg/m2 , BMI Body mass index is 29.61 kg/(m^2).  General: Alert, oriented x3, no distress Head: no evidence of trauma, PERRL, EOMI, no exophtalmos or lid lag, no myxedema, no xanthelasma; normal ears, nose and oropharynx Neck: normal jugular venous pulsations and no hepatojugular reflux; brisk carotid pulses without delay and no carotid bruits Chest: clear to auscultation, no signs of consolidation by percussion or palpation, normal fremitus, symmetrical and full respiratory excursions Cardiovascular: inferolaterally displaced apical impulse, irregular rhythm, normal first and second heart sounds, no murmurs, rubs or gallops Abdomen: no tenderness or distention, no masses by palpation, no abnormal pulsatility or arterial bruits, normal bowel sounds, no hepatosplenomegaly Extremities: no clubbing, cyanosis or edema; 2+ radial, ulnar and brachial pulses bilaterally; 2+ right femoral, posterior tibial and dorsalis pedis pulses; 2+ left femoral, posterior tibial and dorsalis pedis pulses; no subclavian or femoral bruits Neurological: grossly nonfocal Psych: euthymic mood, full affect   EKG:  EKG is not ordered today. The previous ECGs demonstrate atrial fibrillation, LBBB with extreme left axis, QRS 132  ms   Recent Labs: 10/14/2014: ALT 46 10/17/2014: Magnesium 1.9 11/28/2014: Hemoglobin 12.7*; Platelets 164 12/19/2014: Pro B Natriuretic peptide (BNP) 4338.00* 03/22/2015: BUN 20; Creatinine 1.59*; Potassium 5.0; Sodium 134*    Lipid Panel    Component Value  Date/Time   CHOL 197 03/09/2013 0435   TRIG 198* 03/09/2013 0435   HDL 42 03/09/2013 0435   CHOLHDL 4.7 03/09/2013 0435   VLDL 40 03/09/2013 0435   LDLCALC 115* 03/09/2013 0435      Wt Readings from Last 3 Encounters:  04/01/15 218 lb 6.4 oz (99.066 kg)  03/22/15 217 lb (98.431 kg)  03/06/15 213 lb (96.616 kg)     ASSESSMENT AND PLAN:  Mr. Heal has advanced ischemic cardiomyopathy and meets MADIT-2 criteria for AICD. He also has LBBB and QRS>130 ms and might benefit from CRT.  Unfortunately he also has features that may predict lack of response to CRT: ischemic cardiomyopathy, male gender, QRS<150 and the presence of atrial fibrillation.  The benefit of CRT-P or CRT-D may also be mitigated by his age and comorbid conditions, including COPD, hemidiaphragm paralysis and back problems.  He is quite firm in his desire to"stick around" and watch his great grandchildren grow up. Would like to offer him consultation with EP to get their opinion on the benefit of CRT.   Current medicines are reviewed at length with the patient today.  The patient does not have concerns regarding medicines.  The following changes have been made:  no change  Labs/ tests ordered today include:  Orders Placed This Encounter  Procedures  . Ambulatory referral to Cardiac Electrophysiology    Patient Instructions  A referral has been placed for an Appointment with Dr. Lewayne Bunting to discuss implantation of a BIV ICD    Signed, Bryton Waight, MD  04/01/2015 9:40 AM    Thurmon Fair, MD, [facility] [phone] office [phone] pager

## 2015-04-05 NOTE — Progress Notes (Signed)
HR was in 60s the day I saw him in clinic. I believe AF is indeed permanent. I know he is not the greatest CRT candidate and if you think he is best suited for plain ICD (or no device at all) I can take care of that. JB specifically wanted a CRT review, I'm not sure why he did not send him directly to EP.

## 2015-04-19 ENCOUNTER — Ambulatory Visit (HOSPITAL_COMMUNITY)
Admission: RE | Admit: 2015-04-19 | Discharge: 2015-04-19 | Disposition: A | Discharge status: Home / Self Care | Payer: Medicare Other | Source: Ambulatory Visit | Attending: Cardiology | Admitting: Cardiology

## 2015-04-19 ENCOUNTER — Ambulatory Visit (INDEPENDENT_AMBULATORY_CARE_PROVIDER_SITE_OTHER): Payer: Medicare Other | Admitting: Pharmacist Clinician (PhC)/ Clinical Pharmacy Specialist

## 2015-04-19 ENCOUNTER — Encounter (HOSPITAL_COMMUNITY): Payer: Self-pay

## 2015-04-19 VITALS — BP 112/60 | HR 75 | Wt 213.8 lb

## 2015-04-19 DIAGNOSIS — N183 Chronic kidney disease, stage 3 unspecified: Secondary | ICD-10-CM

## 2015-04-19 DIAGNOSIS — Z7901 Long term (current) use of anticoagulants: Secondary | ICD-10-CM | POA: Insufficient documentation

## 2015-04-19 DIAGNOSIS — J986 Disorders of diaphragm: Secondary | ICD-10-CM | POA: Insufficient documentation

## 2015-04-19 DIAGNOSIS — I255 Ischemic cardiomyopathy: Secondary | ICD-10-CM | POA: Insufficient documentation

## 2015-04-19 DIAGNOSIS — Z7952 Long term (current) use of systemic steroids: Secondary | ICD-10-CM | POA: Diagnosis not present

## 2015-04-19 DIAGNOSIS — I25708 Atherosclerosis of coronary artery bypass graft(s), unspecified, with other forms of angina pectoris: Secondary | ICD-10-CM | POA: Diagnosis not present

## 2015-04-19 DIAGNOSIS — I482 Chronic atrial fibrillation: Secondary | ICD-10-CM | POA: Diagnosis not present

## 2015-04-19 DIAGNOSIS — K219 Gastro-esophageal reflux disease without esophagitis: Secondary | ICD-10-CM | POA: Diagnosis not present

## 2015-04-19 DIAGNOSIS — E875 Hyperkalemia: Secondary | ICD-10-CM | POA: Diagnosis not present

## 2015-04-19 DIAGNOSIS — M069 Rheumatoid arthritis, unspecified: Secondary | ICD-10-CM | POA: Diagnosis not present

## 2015-04-19 DIAGNOSIS — I252 Old myocardial infarction: Secondary | ICD-10-CM | POA: Insufficient documentation

## 2015-04-19 DIAGNOSIS — E785 Hyperlipidemia, unspecified: Secondary | ICD-10-CM | POA: Diagnosis not present

## 2015-04-19 DIAGNOSIS — Z7982 Long term (current) use of aspirin: Secondary | ICD-10-CM | POA: Diagnosis not present

## 2015-04-19 DIAGNOSIS — Z951 Presence of aortocoronary bypass graft: Secondary | ICD-10-CM | POA: Insufficient documentation

## 2015-04-19 DIAGNOSIS — E119 Type 2 diabetes mellitus without complications: Secondary | ICD-10-CM | POA: Insufficient documentation

## 2015-04-19 DIAGNOSIS — I4821 Permanent atrial fibrillation: Secondary | ICD-10-CM

## 2015-04-19 DIAGNOSIS — Z79899 Other long term (current) drug therapy: Secondary | ICD-10-CM | POA: Diagnosis not present

## 2015-04-19 DIAGNOSIS — I5022 Chronic systolic (congestive) heart failure: Secondary | ICD-10-CM | POA: Diagnosis present

## 2015-04-19 DIAGNOSIS — I447 Left bundle-branch block, unspecified: Secondary | ICD-10-CM | POA: Insufficient documentation

## 2015-04-19 DIAGNOSIS — E039 Hypothyroidism, unspecified: Secondary | ICD-10-CM | POA: Diagnosis not present

## 2015-04-19 LAB — BASIC METABOLIC PANEL
ANION GAP: 14 (ref 5–15)
BUN: 19 mg/dL (ref 6–20)
CALCIUM: 9.6 mg/dL (ref 8.9–10.3)
CHLORIDE: 97 mmol/L — AB (ref 101–111)
CO2: 23 mmol/L (ref 22–32)
CREATININE: 1.44 mg/dL — AB (ref 0.61–1.24)
GFR calc Af Amer: 53 mL/min — ABNORMAL LOW (ref >60)
GFR calc non Af Amer: 46 mL/min — ABNORMAL LOW (ref >60)
Glucose, Bld: 187 mg/dL — ABNORMAL HIGH (ref 70–99)
Potassium: 4.2 mmol/L (ref 3.5–5.1)
SODIUM: 134 mmol/L — AB (ref 135–145)

## 2015-04-19 LAB — POCT INR: INR: 2.4

## 2015-04-19 LAB — BRAIN NATRIURETIC PEPTIDE: B Natriuretic Peptide: 573.6 pg/mL — ABNORMAL HIGH (ref 0.0–100.0)

## 2015-04-19 MED ORDER — RANOLAZINE ER 500 MG PO TB12: 500.0000 mg | ORAL_TABLET | Freq: Two times a day (BID) | ORAL | Status: DC

## 2015-04-19 MED ORDER — HYDRALAZINE HCL 25 MG PO TABS: 37.5000 mg | ORAL_TABLET | Freq: Three times a day (TID) | ORAL | Status: DC

## 2015-04-19 NOTE — Patient Instructions (Signed)
INCREASE Hydralazine to 37.5mg  (1 and 1/2 tablets) three times a day.  START Ranexa 500 mg once a day for 3 days then take 500 mg twice a day.  LABS today bmet and bnp.  FOLLOW UP in 3 weeks.

## 2015-04-21 NOTE — Progress Notes (Signed)
Patient ID: [name], male   DOB: [birth year], 74 y.o.   MRN: [id number] PCP: Dr. Thayer Headings Primary Cardiologist: Dr. [name]  HPI: [name] is a 74 yo male with a history of HTN, DM2, HLD, CAD s/p CABG 1998, ischemic cardiomyopathy, chronic angina, chronic atrial fibrillation, LBBB, rheumatoid arthritis on chronic steroids, and severe spinal stenosis.  He has a chronically elevated left hemidiaphragm.  He has left hip pain with adductor muscle tears on prior MRI.   In 11/15 had prolonged hospitalization with cardiogenic shock requiring IABP and milrinone. Suspect occlusion of SVG-PDA prior to this hospitalization.Last admitted to [facility] in 12/15 with chest pain. Cardiac enzymes negative. Chest discomfort thought to be from upper respiratory infection and not heart failure. Discharge weight 199 pounds.    He returns for follow up.  He has quite significant musculoskeletal disability.  He is walking only very short distances and has been like this for a long time. He has back pain and leg weakness/pain that are very limiting.  He has been having central chest tightness with walking just short distances (20-25 feet).  This has been present since his 11/15 admission where the SVG-PDA was found to be occluded.  This has not changed any recently.  He has been using NTG frequently.  He is dyspneic also with walking short distances.   He saw Dr Royann Shivers recently to discuss CRT-D and now will be seeing Dr. Ladona Ridgel.   Labs (11/15): K 5.3, creatinine 1.98, BNP 15184 => 5069, hemoglobin 13.6  Labs (1/16): BNP 4338 Labs (01/16/2015): K 4.0 Creatinine 1.36   Labs (9/16): K 5, creatinine 1.59  ECG: atrial fibrillation at 79, LVH with repolarization abnormality, QRS 126 msec        Studies: Echo (10/15/14): EF 25%, mod calcified annulus mitral valve, mod MR, RV sys fx mildly reduced LHC (10/16/14): patent LIMA-LAD and SVG to OM, TO SVG-PDA with TO PDA and 60% proximal RCA, some left =>PDA collaterals;   RHC (10/16/14): RA 20, RV 69/11, PA 69/36 (35), PCWP 35, PA sat: 46% on 100% NRB, Fick CO/CI: 3.15/1.43, Thermo CO/CI: 3.61/1.63 ECG (11/15): atrial fibrillation with LBBB, QRS 132 msec.  ECHO (01/2015): EF 25-30%, mild MR.   ROS: All systems negative except as listed in HPI, PMH and Problem List.  SH:  History   Social History  . Marital Status: Married    Spouse Name: N/A  . Number of Children: N/A  . Years of Education: N/A   Occupational History  . Not on file.   Social History Main Topics  . Smoking status: Never Smoker   . Smokeless tobacco: Never Used  . Alcohol Use: No  . Drug Use: No  . Sexual Activity: Not Currently   Other Topics Concern  . Not on file   Social History Narrative    FH:  Family History  Problem Relation Age of Onset  . Heart disease Mother   . Heart disease Father   . Heart disease Brother   . Heart disease Maternal Aunt   . Cancer      Past Medical History  Diagnosis Date  . Pleuritis     h/o------ with negative vats biopsy  . Coronary artery disease     a. CABG 1998. b. NSTEMI 02/2013: Progression of dz in SVG-RPDA followed by severe stenosis in grafted vessel, not likely safely reacted for PCI - med rx. c. NSTEMI 10/2014: patent LIMA-LAD and SVG to OM, TO SVG-PDA with TO PDA and 60%  proximal RCA, some left =>PDA collaterals; culprit was likely occlusion of SVG-PDA though this may have been subacute given some collaterals.  . 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 Chronic anticoagulation, on coumadin 01/12/2013  . Cardiomyopathy, ischemic EF 25%   . Hypothyroidism   . GERD (gastroesophageal reflux disease)   . Chronic atrial fibrillation   . Digitalis toxicity   . Chronic systolic CHF (congestive heart failure)     a. EF prev 25%, improved to 45-50% in 2014. b. 10/2014: EF back downt o 25%.  [name] Hyperlipidemia   . LBBB (left bundle branch block)   . CKD (chronic kidney disease), stage III   .  Long term current use of systemic steroids     a. For RA.  [name] Spinal stenosis   . Hip pain     a. adductor muscle tears on prior MRI.  [name] Chronic chest pain     a. Intolerant of Ranexa.  . Hyperkalemia     a. Spironolactone d/c'd due to this.  . Elevated hemidiaphragm   . Moderate mitral regurgitation 10/2014 echo  . MI (myocardial infarction)     "10/14/2014 I was told I'd had 2 MI's & didn't even know it" (11/27/2014)  . Pneumonia 10/14/2014  . Type II diabetes mellitus   . Arthritis     all over  . Rheumatoid arthritis   . Chronic back pain     "all up and down my back"  . 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 aspirin EC 81 MG tablet Take 81 mg by mouth daily.    . calcium carbonate (OS-CAL) 600 MG TABS Take 600 mg by mouth at bedtime.     . Diclofenac Sodium 3 % GEL Place onto the skin as needed.    . doxycycline (VIBRA-TABS) 100 MG tablet Take 100 mg by mouth 2 (two) times daily.    . fluticasone (FLONASE) 50 MCG/ACT nasal spray Place 2 sprays into both nostrils daily.     . hydrALAZINE (APRESOLINE) 25 MG tablet Take 1.5 tablets (37.5 mg total) by mouth 3 (three) times daily. 135 tablet 3  . isosorbide mononitrate (IMDUR) 60 MG 24 hr tablet Take 1.5 tablets (90 mg total) by mouth daily. 45 tablet 3  . levothyroxine (SYNTHROID, LEVOTHROID) 175 MCG tablet Take 175 mcg by mouth daily before breakfast.    . metoprolol succinate (TOPROL-XL) 100 MG 24 hr tablet Take 1 tablet (100 mg total) by mouth 2 (two) times daily. Take with or immediately following a meal. 60 tablet 3  . NITROSTAT 0.4 MG SL tablet PLACE 1 TABLET (0.4 MG TOTAL) UNDER THE TONGUE EVERY 5 (FIVE) MINUTES AS NEEDED FOR CHEST PAIN. 100 tablet 1  . nystatin cream (MYCOSTATIN) Apply 1 application topically daily as needed.  4  . Omega-3 Fatty Acids (FISH OIL) 1000 MG CAPS Take 3,000 mg by mouth every  morning.    [name] omeprazole (PRILOSEC) 20 MG capsule Take 20 mg by mouth daily.    [name] oxyCODONE-acetaminophen (PERCOCET/ROXICET) 5-325 MG per tablet Take 1-2 tablets by mouth every 4 (four) hours as needed for severe pain.    . polyethylene glycol (MIRALAX / GLYCOLAX) packet Take 17 g by mouth daily as needed. 14 each 0  . predniSONE (DELTASONE) 10 MG tablet Take 5 mg by mouth 2 (two) times daily with  a meal.     . rosuvastatin (CRESTOR) 20 MG tablet Take 1 tablet (20 mg total) by mouth daily. 30 tablet 0  . saxagliptin HCl (ONGLYZA) 5 MG TABS tablet Take 5 mg by mouth daily.    [name] senna (SENOKOT) 8.6 MG TABS tablet Take 1 tablet (8.6 mg total) by mouth daily. 120 each 0  . torsemide (DEMADEX) 20 MG tablet Take 2 tablets (40 mg total) by mouth daily. 180 tablet 1  . TOUJEO SOLOSTAR 300 UNIT/ML SOPN 32 Units every morning.   4  . triamcinolone cream (KENALOG) 0.1 % Apply 1 application topically as needed (for rash).     . warfarin (COUMADIN) 5 MG tablet Take 1-2 tablets by mouth daily as directed (Patient taking differently: Take 7.5-10 mg by mouth daily at 6 PM. TAKES 10MG  ON MON ONLY; TAKES 7.5MG  ALL OTHER DAYS) 135 tablet 2  . ranolazine (RANEXA) 500 MG 12 hr tablet Take 1 tablet (500 mg total) by mouth 2 (two) times daily. 60 tablet 3   No current facility-administered medications for this encounter.    Filed Vitals:   04/19/15 1205  BP: 112/60  Pulse: 75  Weight: 213 lb 12 oz (96.956 kg)  SpO2: 95%    PHYSICAL EXAM: General:No resp difficulty, in wheelchair, wife and son present HEENT: normal Neck: supple. JVP 7. Carotids 2+ bilaterally; no bruits. No lymphadenopathy or thryomegaly appreciated. Cor: PMI normal. Heart irregular S1/S2, no S3/S4, 1/6 HSM at apex.  Lungs: Clear   Abdomen: soft, nontender, nondistended. No hepatosplenomegaly. No bruits or masses. Good bowel sounds. Extremities: no cyanosis, clubbing, rash.  Trace ankle edema bilaterally.  Neuro: alert & orientedx3,  cranial nerves grossly intact. Moves all 4 extremities w/o difficulty. Affect pleasant.  ASSESSMENT & PLAN: 1) Chronic systolic heart failure: EF 25% (01/2015), ischemic cardiomyopathy s/p CABG.  Patient was admitted to the hospital in 11/15 for acute/chronic systolic CHF with cardiogenic shock in the setting of NSTEMI. He had IABP placed and was diuresed with IV lasix and milrinone. He was weaned off milrinone.  He is very limited by orthopedic problems as well as chest pain and does very little walking. NYHA class IIIb symptoms, not just due to CHF (also orthopedic-related).  Volume looks ok today.  - Continue torsemide at current dose. BMET/BNP today.  - Continue current Toprol XL.  - Increase hydralazine to 37.5 mg tid and continue Imdur.  - Not on spironolactone or ACEI due to hyperkalemia.  - No digoxin due to history of digoxin toxicity.  - He is being considered for CRT-D.  I am not sure that he is a very good ICD candidate given his current quality of life (not very mobile at all, and this is not only due to cardiac issues).  In terms of CRT, his QRS is not markedly wide (only 126 msec on today's ECG) and he is in chronic atrial fibrillation, both of these are factors that tend to predict less efficacy from CRT.  He has not yet decided whether or not he wants an ICD.  He will be seeing Dr 12/15 to discuss device therapy soon. - Reinforced the need and importance of daily weights, a low sodium diet, and fluid restriction (less than 2 L a day). Instructed to call the HF clinic if weight increases more than 3 lbs overnight or 5 lbs in a week.  2) CAD s/p CABG: Culprit for NSTEMI in 11/15 was likely occlusion of SVG-PDA.  There was no good target for  intervention. Since 11/15, he has been having exertional chest pain with relatively minimal exertion (25-30 feet).  This is very limiting but has not changed significantly.  - Continue ASA, Imdur, Toprol XL, and statin.  - Ranolazine is listed under  allergies but I cannot find any documentation of an adverse event.  He does not remember an adverse event.  I asked Dr [name] about this and he does not remember an adverse event either.  I do think ranolazine has the potential to considerably improve his symptoms.  Therefore, I will start it at 500 mg bid and follow him closely.  3) Dyspnea:  Probably due to combination of deconditioning, systolic CHF, and elevated left hemidiaphragm.  He does not have significant COPD, and he did not have interstitial lung disease on last chest CT.     4) Atrial fibrillation: Chronic. HR controlled, will continue Toprol and warfarin.   5) Immobility: Continue to try and be as active as possible.  Follow up in 3 wks.  Marca Ancona 04/21/2015

## 2015-04-25 ENCOUNTER — Ambulatory Visit (INDEPENDENT_AMBULATORY_CARE_PROVIDER_SITE_OTHER): Payer: Medicare Other | Admitting: Internal Medicine

## 2015-04-25 ENCOUNTER — Encounter: Payer: Self-pay | Admitting: Internal Medicine

## 2015-04-25 VITALS — BP 108/60 | HR 78 | Ht 72.0 in | Wt 217.0 lb

## 2015-04-25 DIAGNOSIS — I5023 Acute on chronic systolic (congestive) heart failure: Secondary | ICD-10-CM | POA: Diagnosis not present

## 2015-04-25 DIAGNOSIS — I4891 Unspecified atrial fibrillation: Secondary | ICD-10-CM | POA: Diagnosis not present

## 2015-04-25 DIAGNOSIS — Z01812 Encounter for preprocedural laboratory examination: Secondary | ICD-10-CM

## 2015-04-25 DIAGNOSIS — I447 Left bundle-branch block, unspecified: Secondary | ICD-10-CM | POA: Diagnosis not present

## 2015-04-25 DIAGNOSIS — I25700 Atherosclerosis of coronary artery bypass graft(s), unspecified, with unstable angina pectoris: Secondary | ICD-10-CM

## 2015-04-25 DIAGNOSIS — I5020 Unspecified systolic (congestive) heart failure: Secondary | ICD-10-CM

## 2015-04-25 LAB — BASIC METABOLIC PANEL
BUN: 29 mg/dL — ABNORMAL HIGH (ref 6–23)
CO2: 28 mEq/L (ref 19–32)
Calcium: 9.4 mg/dL (ref 8.4–10.5)
Chloride: 97 mEq/L (ref 96–112)
Creatinine, Ser: 1.83 mg/dL — ABNORMAL HIGH (ref 0.40–1.50)
GFR: 38.48 mL/min — AB (ref >60.00)
GLUCOSE: 276 mg/dL — AB (ref 70–99)
Potassium: 5.1 mEq/L (ref 3.5–5.1)
Sodium: 132 mEq/L — ABNORMAL LOW (ref 135–145)

## 2015-04-25 LAB — CBC WITH DIFFERENTIAL/PLATELET
BASOS ABS: 0 10*3/uL (ref 0.0–0.1)
Basophils Relative: 0.1 % (ref 0.0–3.0)
Eosinophils Absolute: 0 10*3/uL (ref 0.0–0.7)
Eosinophils Relative: 0.2 % (ref 0.0–5.0)
HEMATOCRIT: 42.1 % (ref 39.0–52.0)
HEMOGLOBIN: 14.6 g/dL (ref 13.0–17.0)
LYMPHS ABS: 0.7 10*3/uL (ref 0.7–4.0)
Lymphocytes Relative: 5.4 % — ABNORMAL LOW (ref 12.0–46.0)
MCHC: 34.6 g/dL (ref 30.0–36.0)
MCV: 91.8 fl (ref 78.0–100.0)
MONO ABS: 0.5 10*3/uL (ref 0.1–1.0)
MONOS PCT: 4.5 % (ref 3.0–12.0)
NEUTROS ABS: 10.9 10*3/uL — AB (ref 1.4–7.7)
Neutrophils Relative %: 89.8 % — ABNORMAL HIGH (ref 43.0–77.0)
PLATELETS: 164 10*3/uL (ref 150.0–400.0)
RBC: 4.59 Mil/uL (ref 4.22–5.81)
RDW: 17.4 % — AB (ref 11.5–15.5)
WBC: 12.1 10*3/uL — ABNORMAL HIGH (ref 4.0–10.5)

## 2015-04-25 LAB — PROTIME-INR
INR: 2.2 ratio — AB (ref 0.8–1.0)
Prothrombin Time: 24.2 s — ABNORMAL HIGH (ref 9.6–13.1)

## 2015-04-25 NOTE — Progress Notes (Signed)
HPI [name] is referred today by Dr. [name] to consider BiV ICD implant. He is a pleasant 74 yo man with multiple medical problems who has severe LV dysfunction and class 3 CHF. His EF is 25% and has been so for years. He also has chronic atrial fib with rate controlled reasonably well. He has a h/o lung problems and underwent surgery in the past complicated by a persistently elevated left hemidiaphragm. He is on maximal medical therapy. He has never had syncope. No h/o VT.  Allergies  Allergen Reactions  . Butrans [Buprenorphine] Shortness Of Breath  . Influenza Vaccines Other (See Comments)    Severe coughing for weeks after vaccine; also had flu like symptoms for about 7 to 8 weeks.   . Lanoxin [Digoxin] Other (See Comments)    Nausea, anorexia, caused tremendous weight loss  . Ranexa [Ranolazine Er] Other (See Comments)    TOLD NEVER TO TAKE MED-PER MD  . Lisinopril Other (See Comments)    cough  . Clarithromycin Other (See Comments)    Unknown, thinks "blisters in mouth"  . Statins Other (See Comments)    unknown     Current Outpatient Prescriptions  Medication Sig Dispense Refill  . aspirin EC 81 MG tablet Take 81 mg by mouth daily.    . calcium carbonate (OS-CAL) 600 MG TABS Take 600 mg by mouth at bedtime.     . Diclofenac Sodium 3 % GEL Place 1 application onto the skin daily as needed (PAIN).     . fluticasone (FLONASE) 50 MCG/ACT nasal spray Place 2 sprays into both nostrils daily.     . hydrALAZINE (APRESOLINE) 25 MG tablet Take 1.5 tablets (37.5 mg total) by mouth 3 (three) times daily. 135 tablet 3  . isosorbide mononitrate (IMDUR) 60 MG 24 hr tablet Take 1.5 tablets (90 mg total) by mouth daily. 45 tablet 3  . levothyroxine (SYNTHROID, LEVOTHROID) 175 MCG tablet Take 175 mcg by mouth daily before breakfast.    . metoprolol succinate (TOPROL-XL) 100 MG 24 hr tablet Take 1 tablet (100 mg total) by mouth 2 (two) times daily. Take with or immediately following a  meal. 60 tablet 3  . NITROSTAT 0.4 MG SL tablet PLACE 1 TABLET (0.4 MG TOTAL) UNDER THE TONGUE EVERY 5 (FIVE) MINUTES AS NEEDED FOR CHEST PAIN. 100 tablet 1  . nystatin cream (MYCOSTATIN) Apply 1 application topically daily as needed for dry skin.   4  . Omega-3 Fatty Acids (FISH OIL) 1000 MG CAPS Take 3,000 mg by mouth every morning.    [name] omeprazole (PRILOSEC) 20 MG capsule Take 20 mg by mouth daily.    [name] oxyCODONE-acetaminophen (PERCOCET/ROXICET) 5-325 MG per tablet Take 1-2 tablets by mouth every 4 (four) hours as needed for severe pain.    . polyethylene glycol (MIRALAX / GLYCOLAX) packet Take 17 g by mouth daily as needed. (Patient taking differently: Take 17 g by mouth daily as needed (CONSTIPATION). ) 14 each 0  . predniSONE (DELTASONE) 10 MG tablet Take 5 mg by mouth 2 (two) times daily with a meal.     . ranolazine (RANEXA) 500 MG 12 hr tablet Take 1 tablet (500 mg total) by mouth 2 (two) times daily. 60 tablet 3  . rosuvastatin (CRESTOR) 20 MG tablet Take 1 tablet (20 mg total) by mouth daily. 30 tablet 0  . saxagliptin HCl (ONGLYZA) 5 MG TABS tablet Take 5 mg by mouth daily.    [name] senna (SENOKOT) 8.6  MG TABS tablet Take 1 tablet (8.6 mg total) by mouth daily. 120 each 0  . torsemide (DEMADEX) 20 MG tablet Take 2 tablets (40 mg total) by mouth daily. 180 tablet 1  . TOUJEO SOLOSTAR 300 UNIT/ML SOPN Inject 32 Units into the skin every morning.   4  . triamcinolone cream (KENALOG) 0.1 % Apply 1 application topically daily as needed (for rash).     . warfarin (COUMADIN) 5 MG tablet Take 1-2 tablets by mouth daily as directed (Patient taking differently: Take 7.5-10 mg by mouth daily at 6 PM. TAKES 10MG  ON MON ONLY; TAKES 7.5MG  ALL OTHER DAYS) 135 tablet 2   No current facility-administered medications for this visit.     Past Medical History  Diagnosis Date  . Pleuritis     h/o------ with negative vats biopsy  . Coronary artery disease     a. CABG 1998. b. NSTEMI 02/2013: Progression  of dz in SVG-RPDA followed by severe stenosis in grafted vessel, not likely safely reacted for PCI - med rx. c. NSTEMI 10/2014: patent LIMA-LAD and SVG to OM, TO SVG-PDA with TO PDA and 60% proximal RCA, some left =>PDA collaterals; culprit was likely occlusion of SVG-PDA though this may have been subacute given some collaterals.  . 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 Chronic anticoagulation, on coumadin 01/12/2013  . Cardiomyopathy, ischemic EF 25%   . Hypothyroidism   . GERD (gastroesophageal reflux disease)   . Chronic atrial fibrillation   . Digitalis toxicity   . Chronic systolic CHF (congestive heart failure)     a. EF prev 25%, improved to 45-50% in 2014. b. 10/2014: EF back downt o 25%.  11/2014 Hyperlipidemia   . LBBB (left bundle branch block)   . CKD (chronic kidney disease), stage III   . Long term current use of systemic steroids     a. For RA.  [name] Spinal stenosis   . Hip pain     a. adductor muscle tears on prior MRI.  [name] Chronic chest pain     a. Intolerant of Ranexa.  . Hyperkalemia     a. Spironolactone d/c'd due to this.  . Elevated hemidiaphragm   . Moderate mitral regurgitation 10/2014 echo  . MI (myocardial infarction)     "10/14/2014 I was told I'd had 2 MI's & didn't even know it" (11/27/2014)  . Pneumonia 10/14/2014  . Type II diabetes mellitus   . Arthritis     all over  . Rheumatoid arthritis   . Chronic back pain     "all up and down my back"  . 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   Past Surgical History  Procedure Laterality Date  . Lung surgery  1994    "infection in lungs; scraped it out on right; 6 wks later they did left; clipped left diaphragm & paralyzed it"  . Total knee arthroplasty Bilateral 2000's  . Carpal tunnel release Bilateral 1990's  . Cataract extraction w/ intraocular  lens  implant, bilateral Bilateral 1990's  . Cardiac catheterization Left 01/11/2013    Severe CAD with notable progression of disease in the SVG-RPDA followed by severe stenosis in grafted vessel, region not safely "reachede" for PCI, moderate ischemic CM no notable chage in EF, continue medical therapy  . Cardiac catheterization  04/03/2011  Left circumflex 95% stenosis, essentially unchanged from last cath  . Cardiac catheterization  04/01/2010    LAD 99% proximal stenosis with vague flow, circumflex 80% stenosis, essentially unchanged form last cath  . Cardiac catheterization  03/28/2010    Mild pulmonary hypertension, medical therapy  . Cardiac catheterization  04/30/2009    LAD 95% stenosis, essentially unchanged fomr last cath, medical therapy  . Cardiac catheterization  12/05/2008    2.5x12 apex, predilation in the distal PLA branch, 3x23 XIENCE V durg eluting stent and post dilated with a 3.25x20 [place] VOYAGER at 15 atmospheres  . Left heart catheterization with coronary/graft angiogram N/A 01/13/2013    Procedure: LEFT HEART CATHETERIZATION WITH Isabel Caprice;  Surgeon: Marykay Lex, MD;  Location: [facility] CATH LAB;  Service: Cardiovascular;  Laterality: N/A;  . Left and right heart catheterization with coronary/graft angiogram N/A 10/16/2014    Procedure: LEFT AND RIGHT HEART CATHETERIZATION WITH Isabel Caprice;  Surgeon: Marykay Lex, MD;  Location: [facility] CATH LAB;  Service: Cardiovascular;  Laterality: N/A;  . Coronary artery bypass graft  05/1997    "CABG X7"  . Patella fracture surgery Right "before replacement"  . Knee arthroscopy Left "before replacement     Family History  Problem Relation Age of Onset  . Heart disease Mother   . Heart disease Father   . Heart disease Brother   . Heart disease Maternal Aunt   . Cancer       History   Social History  . Marital Status: Married    Spouse Name: N/A  . Number of Children: N/A  . Years of Education: N/A     Occupational History  . Not on file.   Social History Main Topics  . Smoking status: Never Smoker   . Smokeless tobacco: Never Used  . Alcohol Use: No  . Drug Use: No  . Sexual Activity: Not Currently   Other Topics Concern  . Not on file   Social History Narrative     BP 108/60 mmHg  Pulse 78  Ht 6' (1.829 m)  Wt 217 lb (98.431 kg)  BMI 29.42 kg/m2  Physical Exam:  Well appearing 74 yo man, NAD HEENT: Unremarkable Neck:  7 cm JVD, no thyromegally Lymphatics:  No adenopathy Back:  No CVA tenderness Lungs:  Clear HEART:  IRegular rate rhythm, no murmurs, no rubs, no clicks Abd:  soft, positive bowel sounds, no organomegally, no rebound, no guarding Ext:  2 plus pulses, no edema, no cyanosis, no clubbing Skin:  No rashes no nodules Neuro:  CN II through XII intact, motor grossly intact  EKG - atrial fib with LBBB/IVCD and QRS duration 130 ms  DEVICE  Normal device function.  See PaceArt for details.   Assess/Plan:

## 2015-04-25 NOTE — Assessment & Plan Note (Signed)
His symptoms are class 3. I have counseled the patient and his family regarding BiV ICD implant. He has a class 2B indication with IVCD and QRS duration of 130 ms and class 3 heart failure with an EF of 25%. He would like to proceed.

## 2015-04-25 NOTE — Patient Instructions (Signed)
Medication Instructions:  Your physician recommends that you continue on your current medications as directed. Please refer to the Current Medication list given to you today.   Labwork: Your physician recommends that you return for lab work in: TODAY (CBC,BMET, INR)   Testing/Procedures: NONE  Follow-Up: Your physician recommends that you schedule a follow-up appointment in: Device Clinic 7-10 days from 05/01/2015   Any Other Special Instructions Will Be Listed Below (If Applicable).

## 2015-04-25 NOTE — Assessment & Plan Note (Signed)
He denies anginal symptoms. Will follow and continue his current meds.

## 2015-04-25 NOTE — Assessment & Plan Note (Signed)
He has longstanding chronic atrial fib. I hope that we can uptitrate his medical therapy once his device is in place.

## 2015-04-29 ENCOUNTER — Other Ambulatory Visit: Payer: Self-pay | Admitting: Pharmacist Clinician (PhC)/ Clinical Pharmacy Specialist

## 2015-05-05 MED ORDER — SODIUM CHLORIDE 0.9 % IR SOLN
80.0000 mg | Status: AC
  Administered 2015-05-06: 80 mg
  Filled 2015-05-05 (×2): qty 2

## 2015-05-06 ENCOUNTER — Encounter (HOSPITAL_COMMUNITY): Payer: Self-pay | Admitting: *Deleted

## 2015-05-06 ENCOUNTER — Encounter (HOSPITAL_COMMUNITY)
Admission: RE | Disposition: A | Discharge status: Home / Self Care | Payer: Medicare Other | Source: Ambulatory Visit | Attending: Internal Medicine

## 2015-05-06 ENCOUNTER — Ambulatory Visit (HOSPITAL_COMMUNITY)
Admission: RE | Admit: 2015-05-06 | Discharge: 2015-05-07 | Disposition: A | Discharge status: Home / Self Care | Payer: Medicare Other | Source: Ambulatory Visit | Attending: Internal Medicine | Admitting: Internal Medicine

## 2015-05-06 DIAGNOSIS — E875 Hyperkalemia: Secondary | ICD-10-CM | POA: Insufficient documentation

## 2015-05-06 DIAGNOSIS — I482 Chronic atrial fibrillation: Secondary | ICD-10-CM | POA: Diagnosis not present

## 2015-05-06 DIAGNOSIS — N183 Chronic kidney disease, stage 3 (moderate): Secondary | ICD-10-CM | POA: Diagnosis not present

## 2015-05-06 DIAGNOSIS — E785 Hyperlipidemia, unspecified: Secondary | ICD-10-CM | POA: Diagnosis not present

## 2015-05-06 DIAGNOSIS — Z794 Long term (current) use of insulin: Secondary | ICD-10-CM | POA: Diagnosis not present

## 2015-05-06 DIAGNOSIS — Z888 Allergy status to other drugs, medicaments and biological substances status: Secondary | ICD-10-CM | POA: Diagnosis not present

## 2015-05-06 DIAGNOSIS — I5022 Chronic systolic (congestive) heart failure: Secondary | ICD-10-CM

## 2015-05-06 DIAGNOSIS — E119 Type 2 diabetes mellitus without complications: Secondary | ICD-10-CM | POA: Insufficient documentation

## 2015-05-06 DIAGNOSIS — K219 Gastro-esophageal reflux disease without esophagitis: Secondary | ICD-10-CM | POA: Diagnosis not present

## 2015-05-06 DIAGNOSIS — Z951 Presence of aortocoronary bypass graft: Secondary | ICD-10-CM | POA: Insufficient documentation

## 2015-05-06 DIAGNOSIS — Z7982 Long term (current) use of aspirin: Secondary | ICD-10-CM | POA: Insufficient documentation

## 2015-05-06 DIAGNOSIS — I255 Ischemic cardiomyopathy: Secondary | ICD-10-CM

## 2015-05-06 DIAGNOSIS — I5042 Chronic combined systolic (congestive) and diastolic (congestive) heart failure: Secondary | ICD-10-CM | POA: Diagnosis present

## 2015-05-06 DIAGNOSIS — I447 Left bundle-branch block, unspecified: Secondary | ICD-10-CM | POA: Insufficient documentation

## 2015-05-06 DIAGNOSIS — Z881 Allergy status to other antibiotic agents status: Secondary | ICD-10-CM | POA: Insufficient documentation

## 2015-05-06 DIAGNOSIS — E039 Hypothyroidism, unspecified: Secondary | ICD-10-CM | POA: Insufficient documentation

## 2015-05-06 DIAGNOSIS — M069 Rheumatoid arthritis, unspecified: Secondary | ICD-10-CM | POA: Insufficient documentation

## 2015-05-06 DIAGNOSIS — I252 Old myocardial infarction: Secondary | ICD-10-CM | POA: Diagnosis not present

## 2015-05-06 DIAGNOSIS — Z8701 Personal history of pneumonia (recurrent): Secondary | ICD-10-CM | POA: Insufficient documentation

## 2015-05-06 DIAGNOSIS — I2510 Atherosclerotic heart disease of native coronary artery without angina pectoris: Secondary | ICD-10-CM | POA: Diagnosis not present

## 2015-05-06 DIAGNOSIS — Z7901 Long term (current) use of anticoagulants: Secondary | ICD-10-CM | POA: Insufficient documentation

## 2015-05-06 DIAGNOSIS — Z887 Allergy status to serum and vaccine status: Secondary | ICD-10-CM | POA: Diagnosis not present

## 2015-05-06 DIAGNOSIS — Z95810 Presence of automatic (implantable) cardiac defibrillator: Secondary | ICD-10-CM

## 2015-05-06 HISTORY — PX: EP IMPLANTABLE DEVICE: SHX172B

## 2015-05-06 LAB — BASIC METABOLIC PANEL
ANION GAP: 9 (ref 5–15)
BUN: 24 mg/dL — AB (ref 6–20)
CHLORIDE: 98 mmol/L — AB (ref 101–111)
CO2: 26 mmol/L (ref 22–32)
Calcium: 9.2 mg/dL (ref 8.9–10.3)
Creatinine, Ser: 1.58 mg/dL — ABNORMAL HIGH (ref 0.61–1.24)
GFR calc Af Amer: 48 mL/min — ABNORMAL LOW (ref >60)
GFR calc non Af Amer: 41 mL/min — ABNORMAL LOW (ref >60)
GLUCOSE: 166 mg/dL — AB (ref 65–99)
Potassium: 4 mmol/L (ref 3.5–5.1)
SODIUM: 133 mmol/L — AB (ref 135–145)

## 2015-05-06 LAB — SURGICAL PCR SCREEN
MRSA, PCR: NEGATIVE
Staphylococcus aureus: NEGATIVE

## 2015-05-06 LAB — GLUCOSE, CAPILLARY: Glucose-Capillary: 149 mg/dL — ABNORMAL HIGH (ref 65–99)

## 2015-05-06 LAB — PROTIME-INR
INR: 2.12 — ABNORMAL HIGH (ref 0.00–1.49)
Prothrombin Time: 23.6 seconds — ABNORMAL HIGH (ref 11.6–15.2)

## 2015-05-06 SURGERY — BIV ICD INSERTION CRT-D
Anesthesia: LOCAL

## 2015-05-06 MED ORDER — MIDAZOLAM HCL 5 MG/5ML IJ SOLN
INTRAMUSCULAR | Status: AC
  Filled 2015-05-06: qty 5

## 2015-05-06 MED ORDER — INSULIN GLARGINE 100 UNIT/ML ~~LOC~~ SOLN
32.0000 [IU] | Freq: Every morning | SUBCUTANEOUS | Status: DC
  Filled 2015-05-06: qty 0.32

## 2015-05-06 MED ORDER — PREDNISONE 5 MG PO TABS
5.0000 mg | ORAL_TABLET | Freq: Two times a day (BID) | ORAL | Status: DC
  Administered 2015-05-06: 5 mg via ORAL
  Filled 2015-05-06 (×5): qty 1

## 2015-05-06 MED ORDER — LIDOCAINE HCL (PF) 1 % IJ SOLN
INTRAMUSCULAR | Status: DC | PRN
Start: 2015-05-06 — End: 2015-05-06
  Administered 2015-05-06: 50 mL via SUBCUTANEOUS

## 2015-05-06 MED ORDER — CEFAZOLIN SODIUM 1-5 GM-% IV SOLN
1.0000 g | Freq: Four times a day (QID) | INTRAVENOUS | Status: AC
  Administered 2015-05-06 – 2015-05-07 (×3): 1 g via INTRAVENOUS
  Filled 2015-05-06 (×3): qty 50

## 2015-05-06 MED ORDER — SODIUM CHLORIDE 0.9 % IV SOLN: INTRAVENOUS | Status: DC

## 2015-05-06 MED ORDER — FLUTICASONE PROPIONATE 50 MCG/ACT NA SUSP
2.0000 | Freq: Every day | NASAL | Status: DC
  Administered 2015-05-06: 2 via NASAL
  Filled 2015-05-06: qty 16

## 2015-05-06 MED ORDER — LIDOCAINE HCL (PF) 1 % IJ SOLN
INTRAMUSCULAR | Status: AC
  Filled 2015-05-06: qty 60

## 2015-05-06 MED ORDER — PANTOPRAZOLE SODIUM 40 MG PO TBEC
40.0000 mg | DELAYED_RELEASE_TABLET | Freq: Every day | ORAL | Status: DC
  Administered 2015-05-06: 40 mg via ORAL
  Filled 2015-05-06: qty 1

## 2015-05-06 MED ORDER — ISOSORBIDE MONONITRATE ER 30 MG PO TB24
90.0000 mg | ORAL_TABLET | Freq: Every day | ORAL | Status: DC
  Administered 2015-05-06: 90 mg via ORAL
  Filled 2015-05-06 (×2): qty 1

## 2015-05-06 MED ORDER — CHLORHEXIDINE GLUCONATE 4 % EX LIQD
60.0000 mL | Freq: Once | CUTANEOUS | Status: DC
  Filled 2015-05-06: qty 60

## 2015-05-06 MED ORDER — CEFAZOLIN SODIUM-DEXTROSE 2-3 GM-% IV SOLR
INTRAVENOUS | Status: AC
  Filled 2015-05-06: qty 50

## 2015-05-06 MED ORDER — HEPARIN (PORCINE) IN NACL 2-0.9 UNIT/ML-% IJ SOLN
INTRAMUSCULAR | Status: AC
  Filled 2015-05-06: qty 500

## 2015-05-06 MED ORDER — ONDANSETRON HCL 4 MG/2ML IJ SOLN: 4.0000 mg | Freq: Four times a day (QID) | INTRAMUSCULAR | Status: DC | PRN

## 2015-05-06 MED ORDER — TORSEMIDE 20 MG PO TABS
40.0000 mg | ORAL_TABLET | Freq: Every day | ORAL | Status: DC
  Administered 2015-05-06: 40 mg via ORAL
  Filled 2015-05-06 (×2): qty 2

## 2015-05-06 MED ORDER — MIDAZOLAM HCL 5 MG/5ML IJ SOLN
INTRAMUSCULAR | Status: DC | PRN
  Administered 2015-05-06 (×6): 1 mg via INTRAVENOUS
  Administered 2015-05-06: 2 mg via INTRAVENOUS
  Administered 2015-05-06: 1 mg via INTRAVENOUS

## 2015-05-06 MED ORDER — LEVOTHYROXINE SODIUM 175 MCG PO TABS
175.0000 ug | ORAL_TABLET | Freq: Every day | ORAL | Status: DC
  Filled 2015-05-06 (×2): qty 1

## 2015-05-06 MED ORDER — METOPROLOL SUCCINATE ER 100 MG PO TB24
100.0000 mg | ORAL_TABLET | Freq: Two times a day (BID) | ORAL | Status: DC
  Administered 2015-05-06 (×2): 100 mg via ORAL
  Filled 2015-05-06 (×4): qty 1

## 2015-05-06 MED ORDER — ACETAMINOPHEN 325 MG PO TABS
325.0000 mg | ORAL_TABLET | ORAL | Status: DC | PRN
Start: 2015-05-06 — End: 2015-05-07
  Administered 2015-05-06: 650 mg via ORAL
  Filled 2015-05-06: qty 2

## 2015-05-06 MED ORDER — FENTANYL CITRATE (PF) 100 MCG/2ML IJ SOLN
INTRAMUSCULAR | Status: AC
  Filled 2015-05-06: qty 2

## 2015-05-06 MED ORDER — FENTANYL CITRATE (PF) 100 MCG/2ML IJ SOLN
INTRAMUSCULAR | Status: DC | PRN
  Administered 2015-05-06 (×3): 12.5 ug via INTRAVENOUS
  Administered 2015-05-06: 25 ug via INTRAVENOUS
  Administered 2015-05-06 (×2): 12.5 ug via INTRAVENOUS

## 2015-05-06 MED ORDER — CEFAZOLIN SODIUM-DEXTROSE 2-3 GM-% IV SOLR: 2.0000 g | INTRAVENOUS | Status: DC

## 2015-05-06 MED ORDER — ASPIRIN EC 81 MG PO TBEC
81.0000 mg | DELAYED_RELEASE_TABLET | Freq: Every day | ORAL | Status: DC
  Administered 2015-05-06: 81 mg via ORAL
  Filled 2015-05-06 (×2): qty 1

## 2015-05-06 MED ORDER — MUPIROCIN 2 % EX OINT
1.0000 "application " | TOPICAL_OINTMENT | Freq: Once | CUTANEOUS | Status: AC
  Administered 2015-05-06: 1 via TOPICAL
  Filled 2015-05-06: qty 22

## 2015-05-06 MED ORDER — SENNA 8.6 MG PO TABS
1.0000 | ORAL_TABLET | Freq: Every day | ORAL | Status: DC
  Administered 2015-05-06: 8.6 mg via ORAL
  Filled 2015-05-06 (×2): qty 1

## 2015-05-06 MED ORDER — HYDRALAZINE HCL 25 MG PO TABS
37.5000 mg | ORAL_TABLET | Freq: Three times a day (TID) | ORAL | Status: DC
  Administered 2015-05-06 (×2): 37.5 mg via ORAL
  Filled 2015-05-06 (×5): qty 1.5

## 2015-05-06 MED ORDER — OXYCODONE-ACETAMINOPHEN 5-325 MG PO TABS
1.0000 | ORAL_TABLET | ORAL | Status: DC | PRN
  Administered 2015-05-07: 1 via ORAL
  Filled 2015-05-06: qty 1

## 2015-05-06 MED ORDER — DEXTROSE 5 % IV SOLN
2.0000 g | INTRAVENOUS | Status: DC | PRN
  Administered 2015-05-06: 2 g via INTRAVENOUS

## 2015-05-06 MED ORDER — LINAGLIPTIN 5 MG PO TABS
5.0000 mg | ORAL_TABLET | Freq: Every day | ORAL | Status: DC
  Filled 2015-05-06: qty 1

## 2015-05-06 MED ORDER — MUPIROCIN 2 % EX OINT
TOPICAL_OINTMENT | CUTANEOUS | Status: AC
  Filled 2015-05-06: qty 22

## 2015-05-06 SURGICAL SUPPLY — 20 items
BALLN COR SINUS VENO 6FR 80 (BALLOONS) ×2
BALLOON COR SINUS VENO 6FR 80 (BALLOONS) IMPLANT
CABLE SURGICAL S-101-97-12 (CABLE) ×1 IMPLANT
CATH CPS DIRECT 135 DS2C020 (CATHETERS) ×1 IMPLANT
CATH CPS QUART CN DS2N029-65 (CATHETERS) ×1 IMPLANT
CATH CPS QUART SUB DS2N027-59 (CATHETERS) ×1 IMPLANT
CATH HEX JOS 2-5-2 65CM 6F REP (CATHETERS) ×1 IMPLANT
ICD QUADRA ASURA CD3365-40C (ICD Generator) ×1 IMPLANT
KIT ESSENTIALS PG (KITS) ×1 IMPLANT
LEAD DURATA 7122-65CM (Lead) ×1 IMPLANT
LEAD QUARTET 1458Q-86CM (Lead) ×1 IMPLANT
LEAD TENDRIL SDX 1688TC-52CM (Lead) ×1 IMPLANT
PAD DEFIB LIFELINK (PAD) ×1 IMPLANT
SHEATH CLASSIC 9.5F (SHEATH) ×1 IMPLANT
SHEATH COOK PEEL AWAY SET 7F (SHEATH) ×1 IMPLANT
SHEATH COOK PEEL AWAY SET 8F (SHEATH) ×1 IMPLANT
TRAY PACEMAKER INSERTION (CUSTOM PROCEDURE TRAY) ×1 IMPLANT
WIRE ACUITY WHISPER EDS 4648 (WIRE) ×1 IMPLANT
WIRE ASAHI SOFT 180CM (WIRE) ×1 IMPLANT
WIRE LUGE 182CM (WIRE) ×2 IMPLANT

## 2015-05-06 NOTE — H&P (View-Only) (Signed)
HPI [name] is referred today by Dr. [name] to consider BiV ICD implant. He is a pleasant 74 yo man with multiple medical problems who has severe LV dysfunction and class 3 CHF. His EF is 25% and has been so for years. He also has chronic atrial fib with rate controlled reasonably well. He has a h/o lung problems and underwent surgery in the past complicated by a persistently elevated left hemidiaphragm. He is on maximal medical therapy. He has never had syncope. No h/o VT.  Allergies  Allergen Reactions  . Butrans [Buprenorphine] Shortness Of Breath  . Influenza Vaccines Other (See Comments)    Severe coughing for weeks after vaccine; also had flu like symptoms for about 7 to 8 weeks.   . Lanoxin [Digoxin] Other (See Comments)    Nausea, anorexia, caused tremendous weight loss  . Ranexa [Ranolazine Er] Other (See Comments)    TOLD NEVER TO TAKE MED-PER MD  . Lisinopril Other (See Comments)    cough  . Clarithromycin Other (See Comments)    Unknown, thinks "blisters in mouth"  . Statins Other (See Comments)    unknown     Current Outpatient Prescriptions  Medication Sig Dispense Refill  . aspirin EC 81 MG tablet Take 81 mg by mouth daily.    . calcium carbonate (OS-CAL) 600 MG TABS Take 600 mg by mouth at bedtime.     . Diclofenac Sodium 3 % GEL Place 1 application onto the skin daily as needed (PAIN).     . fluticasone (FLONASE) 50 MCG/ACT nasal spray Place 2 sprays into both nostrils daily.     . hydrALAZINE (APRESOLINE) 25 MG tablet Take 1.5 tablets (37.5 mg total) by mouth 3 (three) times daily. 135 tablet 3  . isosorbide mononitrate (IMDUR) 60 MG 24 hr tablet Take 1.5 tablets (90 mg total) by mouth daily. 45 tablet 3  . levothyroxine (SYNTHROID, LEVOTHROID) 175 MCG tablet Take 175 mcg by mouth daily before breakfast.    . metoprolol succinate (TOPROL-XL) 100 MG 24 hr tablet Take 1 tablet (100 mg total) by mouth 2 (two) times daily. Take with or immediately following a  meal. 60 tablet 3  . NITROSTAT 0.4 MG SL tablet PLACE 1 TABLET (0.4 MG TOTAL) UNDER THE TONGUE EVERY 5 (FIVE) MINUTES AS NEEDED FOR CHEST PAIN. 100 tablet 1  . nystatin cream (MYCOSTATIN) Apply 1 application topically daily as needed for dry skin.   4  . Omega-3 Fatty Acids (FISH OIL) 1000 MG CAPS Take 3,000 mg by mouth every morning.    [name] omeprazole (PRILOSEC) 20 MG capsule Take 20 mg by mouth daily.    [name] oxyCODONE-acetaminophen (PERCOCET/ROXICET) 5-325 MG per tablet Take 1-2 tablets by mouth every 4 (four) hours as needed for severe pain.    . polyethylene glycol (MIRALAX / GLYCOLAX) packet Take 17 g by mouth daily as needed. (Patient taking differently: Take 17 g by mouth daily as needed (CONSTIPATION). ) 14 each 0  . predniSONE (DELTASONE) 10 MG tablet Take 5 mg by mouth 2 (two) times daily with a meal.     . ranolazine (RANEXA) 500 MG 12 hr tablet Take 1 tablet (500 mg total) by mouth 2 (two) times daily. 60 tablet 3  . rosuvastatin (CRESTOR) 20 MG tablet Take 1 tablet (20 mg total) by mouth daily. 30 tablet 0  . saxagliptin HCl (ONGLYZA) 5 MG TABS tablet Take 5 mg by mouth daily.    [name] senna (SENOKOT) 8.6  MG TABS tablet Take 1 tablet (8.6 mg total) by mouth daily. 120 each 0  . torsemide (DEMADEX) 20 MG tablet Take 2 tablets (40 mg total) by mouth daily. 180 tablet 1  . TOUJEO SOLOSTAR 300 UNIT/ML SOPN Inject 32 Units into the skin every morning.   4  . triamcinolone cream (KENALOG) 0.1 % Apply 1 application topically daily as needed (for rash).     . warfarin (COUMADIN) 5 MG tablet Take 1-2 tablets by mouth daily as directed (Patient taking differently: Take 7.5-10 mg by mouth daily at 6 PM. TAKES 10MG  ON MON ONLY; TAKES 7.5MG  ALL OTHER DAYS) 135 tablet 2   No current facility-administered medications for this visit.     Past Medical History  Diagnosis Date  . Pleuritis     h/o------ with negative vats biopsy  . Coronary artery disease     a. CABG 1998. b. NSTEMI 02/2013: Progression  of dz in SVG-RPDA followed by severe stenosis in grafted vessel, not likely safely reacted for PCI - med rx. c. NSTEMI 10/2014: patent LIMA-LAD and SVG to OM, TO SVG-PDA with TO PDA and 60% proximal RCA, some left =>PDA collaterals; culprit was likely occlusion of SVG-PDA though this may have been subacute given some collaterals.  . 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 Chronic anticoagulation, on coumadin 01/12/2013  . Cardiomyopathy, ischemic EF 25%   . Hypothyroidism   . GERD (gastroesophageal reflux disease)   . Chronic atrial fibrillation   . Digitalis toxicity   . Chronic systolic CHF (congestive heart failure)     a. EF prev 25%, improved to 45-50% in 2014. b. 10/2014: EF back downt o 25%.  11/2014 Hyperlipidemia   . LBBB (left bundle branch block)   . CKD (chronic kidney disease), stage III   . Long term current use of systemic steroids     a. For RA.  [name] Spinal stenosis   . Hip pain     a. adductor muscle tears on prior MRI.  [name] Chronic chest pain     a. Intolerant of Ranexa.  . Hyperkalemia     a. Spironolactone d/c'd due to this.  . Elevated hemidiaphragm   . Moderate mitral regurgitation 10/2014 echo  . MI (myocardial infarction)     "10/14/2014 I was told I'd had 2 MI's & didn't even know it" (11/27/2014)  . Pneumonia 10/14/2014  . Type II diabetes mellitus   . Arthritis     all over  . Rheumatoid arthritis   . Chronic back pain     "all up and down my back"  . 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   Past Surgical History  Procedure Laterality Date  . Lung surgery  1994    "infection in lungs; scraped it out on right; 6 wks later they did left; clipped left diaphragm & paralyzed it"  . Total knee arthroplasty Bilateral 2000's  . Carpal tunnel release Bilateral 1990's  . Cataract extraction w/ intraocular  lens  implant, bilateral Bilateral 1990's  . Cardiac catheterization Left 01/11/2013    Severe CAD with notable progression of disease in the SVG-RPDA followed by severe stenosis in grafted vessel, region not safely "reachede" for PCI, moderate ischemic CM no notable chage in EF, continue medical therapy  . Cardiac catheterization  04/03/2011  Left circumflex 95% stenosis, essentially unchanged from last cath  . Cardiac catheterization  04/01/2010    LAD 99% proximal stenosis with vague flow, circumflex 80% stenosis, essentially unchanged form last cath  . Cardiac catheterization  03/28/2010    Mild pulmonary hypertension, medical therapy  . Cardiac catheterization  04/30/2009    LAD 95% stenosis, essentially unchanged fomr last cath, medical therapy  . Cardiac catheterization  12/05/2008    2.5x12 apex, predilation in the distal PLA branch, 3x23 XIENCE V durg eluting stent and post dilated with a 3.25x20 [place] VOYAGER at 15 atmospheres  . Left heart catheterization with coronary/graft angiogram N/A 01/13/2013    Procedure: LEFT HEART CATHETERIZATION WITH Isabel Caprice;  Surgeon: Marykay Lex, MD;  Location: [facility] CATH LAB;  Service: Cardiovascular;  Laterality: N/A;  . Left and right heart catheterization with coronary/graft angiogram N/A 10/16/2014    Procedure: LEFT AND RIGHT HEART CATHETERIZATION WITH Isabel Caprice;  Surgeon: Marykay Lex, MD;  Location: [facility] CATH LAB;  Service: Cardiovascular;  Laterality: N/A;  . Coronary artery bypass graft  05/1997    "CABG X7"  . Patella fracture surgery Right "before replacement"  . Knee arthroscopy Left "before replacement     Family History  Problem Relation Age of Onset  . Heart disease Mother   . Heart disease Father   . Heart disease Brother   . Heart disease Maternal Aunt   . Cancer       History   Social History  . Marital Status: Married    Spouse Name: N/A  . Number of Children: N/A  . Years of Education: N/A     Occupational History  . Not on file.   Social History Main Topics  . Smoking status: Never Smoker   . Smokeless tobacco: Never Used  . Alcohol Use: No  . Drug Use: No  . Sexual Activity: Not Currently   Other Topics Concern  . Not on file   Social History Narrative     BP 108/60 mmHg  Pulse 78  Ht 6' (1.829 m)  Wt 217 lb (98.431 kg)  BMI 29.42 kg/m2  Physical Exam:  Well appearing 74 yo man, NAD HEENT: Unremarkable Neck:  7 cm JVD, no thyromegally Lymphatics:  No adenopathy Back:  No CVA tenderness Lungs:  Clear HEART:  IRegular rate rhythm, no murmurs, no rubs, no clicks Abd:  soft, positive bowel sounds, no organomegally, no rebound, no guarding Ext:  2 plus pulses, no edema, no cyanosis, no clubbing Skin:  No rashes no nodules Neuro:  CN II through XII intact, motor grossly intact  EKG - atrial fib with LBBB/IVCD and QRS duration 130 ms  DEVICE  Normal device function.  See PaceArt for details.   Assess/Plan:

## 2015-05-06 NOTE — Interval H&P Note (Signed)
History and Physical Interval Note:  05/06/2015 9:05 AM  [name]  has presented today for surgery, with the diagnosis of Vt  The various methods of treatment have been discussed with the patient and family. After consideration of risks, benefits and other options for treatment, the patient has consented to  Procedure(s): BiV ICD Insertion CRT-D (N/A) as a surgical intervention .  The patient's history has been reviewed, patient examined, no change in status, stable for surgery.  I have reviewed the patient's chart and labs.  Questions were answered to the patient's satisfaction.     Leonia Reeves.D.

## 2015-05-06 NOTE — H&P (Signed)
  ICD Criteria  Current LVEF:25% ;Obtained > or = 1 month ago and < or = 3 months ago.  NYHA Functional Classification: Class III  Heart Failure History:  Yes, Duration of heart failure since onset is > 9 months  Non-Ischemic Dilated Cardiomyopathy History:  No.  Atrial Fibrillation/Atrial Flutter:  Yes, A-Fib/A-Flutter type: Permanent (>1 year).  Ventricular Tachycardia History:  No.  Cardiac Arrest History:  No  History of Syndromes with Risk of Sudden Death:  No.  Previous ICD:  No.  Electrophysiology Study: No.  Prior MI: Yes, Most recent MI timeframe is > 40 days.  PPM: No.  OSA:  No  Patient Life Expectancy of >=1 year: Yes.  Anticoagulation Therapy:  Patient is on anticoagulation therapy, anticoagulation was held prior to procedure.   Beta Blocker Therapy:  Yes.   Ace Inhibitor/ARB Therapy:  Yes.

## 2015-05-06 NOTE — Progress Notes (Signed)
Pt arrived to unit alert and orient.  Accompanied by 2 RN.

## 2015-05-06 NOTE — Discharge Instructions (Signed)
° ° °  Supplemental Discharge Instructions for  °Pacemaker/Defibrillator Patients ° °Activity °No heavy lifting or vigorous activity with your left arm for 6 to 8 weeks.  Do not raise your left arm above your head for one week.  Gradually raise your affected arm as drawn below. ° °        ° °  05/07/15                        05/09/15                          05/11/15                  5/30/16__ ° °NO DRIVING for 1 week     ; you may begin driving on  05/13/15   . ° °WOUND CARE °- Keep the wound area clean and dry.  Do not get this area wet for one week. No showers for one week; you may shower on 05/12/15    . °- The tape/steri-strips on your wound will fall off; do not pull them off.  No bandage is needed on the site.  DO  NOT apply any creams, oils, or ointments to the wound area. °- If you notice any drainage or discharge from the wound, any swelling or bruising at the site, or you develop a fever > 101? F after you are discharged home, call the office at once. ° °Special Instructions °- You are still able to use cellular telephones; use the ear opposite the side where you have your pacemaker/defibrillator.  Avoid carrying your cellular phone near your device. °- When traveling through airports, show security personnel your identification card to avoid being screened in the metal detectors.  Ask the security personnel to use the hand wand. °- Avoid arc welding equipment, MRI testing (magnetic resonance imaging), TENS units (transcutaneous nerve stimulators).  Call the office for questions about other devices. °- Avoid electrical appliances that are in poor condition or are not properly grounded. °- Microwave ovens are safe to be near or to operate. ° °Additional information for defibrillator patients should your device go off: °- If your device goes off ONCE and you feel fine afterward, notify the device clinic nurses. °- If your device goes off ONCE and you do not feel well afterward, call 911. °- If your device goes  off TWICE, call 911. °- If your device goes off THREE times in one day, call 911. ° °DO NOT DRIVE YOURSELF OR A FAMILY MEMBER °WITH A DEFIBRILLATOR TO THE HOSPITAL--CALL 911. °

## 2015-05-06 NOTE — Discharge Summary (Signed)
ELECTROPHYSIOLOGY PROCEDURE DISCHARGE SUMMARY    Patient ID: [name],  MRN: [id number], DOB/AGE: 1941-05-21 74 y.o.  Admit date: 05/06/2015 Discharge date: 05/07/2015  Primary Care Physician: Daniel Headings, MD Primary Cardiologist: [name] Heart Failure: [name] Electrophysiologist: [name]  Primary Discharge Diagnosis:  Ischemic cardiomyopathy, congestive heart failure, LBBB status post CRTD implantation this admission  Secondary Discharge Diagnosis:  1.  Permanent atrial fibrillation 2.  Hypothyroidism 3.  Hyperlipidemia 4.  CKD, stage III 5.  Diabetes 6.  Spinal stenosis 7.  Rheumatoid arthritis 8.  CAD s/p CABG  Allergies  Allergen Reactions  . Butrans [Buprenorphine] Shortness Of Breath  . Influenza Vaccines Other (See Comments)    Severe coughing for weeks after vaccine; also had flu like symptoms for about 7 to 8 weeks.   . Lanoxin [Digoxin] Other (See Comments)    Nausea, anorexia, caused tremendous weight loss  . Ranexa [Ranolazine Er] Other (See Comments)    TOLD NEVER TO TAKE MED-PER MD MD put him back on this medication and is tolerating well  . Lisinopril Other (See Comments)    cough  . Clarithromycin Other (See Comments)    Unknown, thinks "blisters in mouth"  . Statins Other (See Comments)    unknown     Procedures This Admission:  1.  Implantation of a STJ CRTD on 05/06/15 by Dr [name].  See op note for full details. There were no immediate post procedure complications. 2.  CXR on 05/07/15 demonstrated no pneumothorax status post device implantation.   Brief HPI: [name] is a 74 y.o. male was referred to electrophysiology in the outpatient setting for consideration of ICD implantation.  Past medical history includes ischemic cardiomyopathy, permanent atrial fibrillation, congestive heart failure, and LBBB.  The patient has persistent LV dysfunction despite guideline directed therapy.  Risks, benefits, and alternatives to CRTD  implantation were reviewed with the patient who wished to proceed.   Hospital Course:  The patient was admitted and underwent implantation of a STJ CRTD with details as outlined above. He was monitored on telemetry overnight which demonstrated atrial fibrillation with intermittent ventricular pacing.  Left chest was without hematoma or ecchymosis.  The device was interrogated and found to be functioning normally.  CXR was obtained and demonstrated no pneumothorax status post device implantation.  Wound care, arm mobility, and restrictions were reviewed with the patient.  The patient was examined and considered stable for discharge to home.   The patient's discharge medications include a beta blocker (Metoprolol).  Not on Spironolactone or ACE-I due to hyperkalemia   Physical Exam: Filed Vitals:   05/06/15 1500 05/06/15 1843 05/06/15 2005 05/07/15 0413  BP: 116/65 115/69 114/75 125/85  Pulse: 74 70 100 72  Temp:   97.7 F (36.5 C) 97.8 F (36.6 C)  TempSrc:   Oral Oral  Resp: 18 18 18 18   Height:      Weight:      SpO2: 96% 95% 95% 95%    GEN- The patient is well appearing, alert and oriented x 3 today.   HEENT: normocephalic, atraumatic; sclera clear, conjunctiva pink; hearing intact; oropharynx clear; neck supple, no JVP Lymph- no cervical lymphadenopathy Lungs- Clear to ausculation bilaterally, normal work of breathing.  No wheezes, rales, rhonchi Heart- Irregular rate and rhythm, no murmurs, rubs or gallops  GI- soft, non-tender, non-distended, bowel sounds present  Extremities- no clubbing, cyanosis, or edema; DP/PT/radial pulses 2+ bilaterally MS- no significant deformity or atrophy Skin- warm and dry, no  rash or lesion, left chest without hematoma/ecchymosis Psych- euthymic mood, full affect Neuro- strength and sensation are intact   Labs:   Lab Results  Component Value Date   WBC 12.1* 04/25/2015   HGB 14.6 04/25/2015   HCT 42.1 04/25/2015   MCV 91.8 04/25/2015   PLT  164.0 04/25/2015     Recent Labs Lab 05/06/15 0800  NA 133*  K 4.0  CL 98*  CO2 26  BUN 24*  CREATININE 1.58*  CALCIUM 9.2  GLUCOSE 166*    Discharge Medications:    Medication List    TAKE these medications        aspirin EC 81 MG tablet  Take 81 mg by mouth daily.     calcium carbonate 600 MG Tabs tablet  Commonly known as:  OS-CAL  Take 600 mg by mouth at bedtime.     Diclofenac Sodium 3 % Gel  Place 1 application onto the skin daily as needed (PAIN).     Fish Oil 1000 MG Caps  Take 3,000 mg by mouth every morning.     fluticasone 50 MCG/ACT nasal spray  Commonly known as:  FLONASE  Place 2 sprays into both nostrils daily.     hydrALAZINE 25 MG tablet  Commonly known as:  APRESOLINE  Take 1.5 tablets (37.5 mg total) by mouth 3 (three) times daily.     isosorbide mononitrate 60 MG 24 hr tablet  Commonly known as:  IMDUR  Take 1.5 tablets (90 mg total) by mouth daily.     levothyroxine 175 MCG tablet  Commonly known as:  SYNTHROID, LEVOTHROID  Take 175 mcg by mouth daily before breakfast.     metoprolol succinate 100 MG 24 hr tablet  Commonly known as:  TOPROL-XL  Take 1 tablet (100 mg total) by mouth 2 (two) times daily. Take with or immediately following a meal.     NITROSTAT 0.4 MG SL tablet  Generic drug:  nitroGLYCERIN  PLACE 1 TABLET (0.4 MG TOTAL) UNDER THE TONGUE EVERY 5 (FIVE) MINUTES AS NEEDED FOR CHEST PAIN.     nystatin cream  Commonly known as:  MYCOSTATIN  Apply 1 application topically daily as needed for dry skin.     omeprazole 20 MG capsule  Commonly known as:  PRILOSEC  Take 20 mg by mouth daily.     ONGLYZA 5 MG Tabs tablet  Generic drug:  saxagliptin HCl  Take 5 mg by mouth daily.     oxyCODONE-acetaminophen 5-325 MG per tablet  Commonly known as:  PERCOCET/ROXICET  Take 1-2 tablets by mouth every 4 (four) hours as needed for severe pain.     polyethylene glycol packet  Commonly known as:  MIRALAX / GLYCOLAX  Take 17 g  by mouth daily as needed.     predniSONE 10 MG tablet  Commonly known as:  DELTASONE  Take 5 mg by mouth 2 (two) times daily with a meal.     ranolazine 500 MG 12 hr tablet  Commonly known as:  RANEXA  Take 1 tablet (500 mg total) by mouth 2 (two) times daily.     rosuvastatin 20 MG tablet  Commonly known as:  CRESTOR  Take 1 tablet (20 mg total) by mouth daily.     senna 8.6 MG Tabs tablet  Commonly known as:  SENOKOT  Take 1 tablet (8.6 mg total) by mouth daily.     torsemide 20 MG tablet  Commonly known as:  DEMADEX  Take 2 tablets (40  mg total) by mouth daily.     TOUJEO SOLOSTAR 300 UNIT/ML Sopn  Generic drug:  Insulin Glargine  Inject 32 Units into the skin every morning.     triamcinolone cream 0.1 %  Commonly known as:  KENALOG  Apply 1 application topically daily as needed (for rash).     warfarin 5 MG tablet  Commonly known as:  COUMADIN  Take 1-2 tablets by mouth daily as directed        Disposition:  Discharge Instructions    Diet - low sodium heart healthy    Complete by:  As directed      Increase activity slowly    Complete by:  As directed           Follow-up Information    Follow up with [facility] On 05/16/2015.   Specialty:  Cardiology   Why:  at 0930  AM  for wound check   Contact information:   [address], [address] [place] [address] [phone]      Follow up with Marily Lente, NP On 06/19/2015.   Specialty:  Nurse Practitioner   Why:  3:30 pm   Contact information:   [address] [place] [address] [phone]       Follow up with [facility] On 05/16/2015.   Specialty:  Cardiology   Why:  at 10:10AM for coumadin check   Contact information:   [address] [place] [address] [phone]      Duration of Discharge Encounter: Greater than 30 minutes including physician time.  Signed, Gypsy Balsam, NP 05/07/2015 7:53 AM   EP  Attending  Patient seen and examined. Agree with above. His BiV ICD is working normally. Will plan to discharge home. He will follow up with Dr. [name] in several days for medication uptitration.  Leonia Reeves.D.

## 2015-05-07 ENCOUNTER — Encounter (HOSPITAL_COMMUNITY): Payer: Self-pay | Admitting: Internal Medicine

## 2015-05-07 ENCOUNTER — Ambulatory Visit (HOSPITAL_COMMUNITY): Payer: Medicare Other

## 2015-05-07 DIAGNOSIS — I5022 Chronic systolic (congestive) heart failure: Secondary | ICD-10-CM

## 2015-05-07 DIAGNOSIS — Z95810 Presence of automatic (implantable) cardiac defibrillator: Secondary | ICD-10-CM

## 2015-05-07 DIAGNOSIS — I255 Ischemic cardiomyopathy: Secondary | ICD-10-CM | POA: Diagnosis not present

## 2015-05-07 MED FILL — Heparin Sodium (Porcine) 2 Unit/ML in Sodium Chloride 0.9%: INTRAMUSCULAR | Qty: 500 | Status: AC

## 2015-05-07 NOTE — Significant Event (Signed)
Patient discharge to home. Discharge instructions reviewed with patient and his family. A copy of AVS given to them. All belongings taken home with patient. Per patient's spouse request, did not want for RN to give patient his AM medications here but will take them at home. Taken to transportation by Agricultural consultant. Ritika Hellickson, Charity fundraiser.

## 2015-05-09 ENCOUNTER — Ambulatory Visit: Payer: Medicare Other

## 2015-05-10 ENCOUNTER — Encounter (HOSPITAL_COMMUNITY): Payer: Self-pay

## 2015-05-10 ENCOUNTER — Ambulatory Visit (HOSPITAL_COMMUNITY)
Admission: RE | Admit: 2015-05-10 | Discharge: 2015-05-10 | Disposition: A | Discharge status: Home / Self Care | Payer: Medicare Other | Source: Ambulatory Visit | Attending: Cardiology | Admitting: Cardiology

## 2015-05-10 ENCOUNTER — Telehealth: Payer: Self-pay | Admitting: Cardiovascular Disease

## 2015-05-10 VITALS — BP 118/78 | HR 84 | Wt 214.8 lb

## 2015-05-10 DIAGNOSIS — I252 Old myocardial infarction: Secondary | ICD-10-CM | POA: Insufficient documentation

## 2015-05-10 DIAGNOSIS — R0600 Dyspnea, unspecified: Secondary | ICD-10-CM | POA: Insufficient documentation

## 2015-05-10 DIAGNOSIS — R5381 Other malaise: Secondary | ICD-10-CM | POA: Diagnosis not present

## 2015-05-10 DIAGNOSIS — I255 Ischemic cardiomyopathy: Secondary | ICD-10-CM | POA: Insufficient documentation

## 2015-05-10 DIAGNOSIS — N183 Chronic kidney disease, stage 3 unspecified: Secondary | ICD-10-CM

## 2015-05-10 DIAGNOSIS — I25708 Atherosclerosis of coronary artery bypass graft(s), unspecified, with other forms of angina pectoris: Secondary | ICD-10-CM | POA: Diagnosis not present

## 2015-05-10 DIAGNOSIS — Z951 Presence of aortocoronary bypass graft: Secondary | ICD-10-CM | POA: Insufficient documentation

## 2015-05-10 DIAGNOSIS — I4821 Permanent atrial fibrillation: Secondary | ICD-10-CM

## 2015-05-10 DIAGNOSIS — I5022 Chronic systolic (congestive) heart failure: Secondary | ICD-10-CM

## 2015-05-10 DIAGNOSIS — I482 Chronic atrial fibrillation: Secondary | ICD-10-CM | POA: Diagnosis not present

## 2015-05-10 NOTE — Telephone Encounter (Signed)
Herbert Seta ws calling in to see if there were any samples of Ranexa 500 available for the pt. Please f/u   Thanks

## 2015-05-10 NOTE — Patient Instructions (Signed)
Start Spironolactone 12. 5 mg ( 1/2 tab) daily  Labs in 1 week  Your physician recommends that you schedule a follow-up appointment in: 2 months

## 2015-05-10 NOTE — Telephone Encounter (Signed)
Ranexa 500 mg samples left at front desk.

## 2015-05-10 NOTE — Progress Notes (Signed)
Patient ID: [name], male   DOB: June 20, 1941, 74 y.o.   MRN: [id number] PCP: Dr. Thayer Headings Primary Cardiologist: Dr. [name]  HPI: [name] is a 74 yo male with a history of HTN, DM2, HLD, CAD s/p CABG 1998, ischemic cardiomyopathy, chronic angina, chronic atrial fibrillation, LBBB, rheumatoid arthritis on chronic steroids, and severe spinal stenosis.  He has a chronically elevated left hemidiaphragm.  He has left hip pain with adductor muscle tears on prior MRI.   In 11/15 had prolonged hospitalization with cardiogenic shock requiring IABP and milrinone. Suspect occlusion of SVG-PDA prior to this hospitalization.Last admitted to [facility] in 12/15 with chest pain. Cardiac enzymes negative. Chest discomfort thought to be from upper respiratory infection and not heart failure. Discharge weight 199 pounds.    He had placement of St Jude CRT-D system in 5/16.  Per his family, "his color seems better."  He was started on ranolazine at last appointment as well.  This seems to have helped a lot with his angina.  He has used NTG only once since starting ranolazine (had chest pain while taking a shower).  He still has quite significant musculoskeletal disability.  He is walking only very short distances and has been like this for a long time. He has back pain and leg weakness/pain that are very limiting.  He has the same exertional dyspnea: short of breath after walking a short distance.  Weight stable.   Labs (11/15): K 5.3, creatinine 1.98, BNP 15184 => 5069, hemoglobin 13.6  Labs (1/16): BNP 4338 Labs (01/16/2015): K 4.0 Creatinine 1.36   Labs (5/16): K 5 => 4, creatinine 1.59 => 1.58, BNP 574  Studies: Echo (10/15/14): EF 25%, mod calcified annulus mitral valve, mod MR, RV sys fx mildly reduced LHC (10/16/14): patent LIMA-LAD and SVG to OM, TO SVG-PDA with TO PDA and 60% proximal RCA, some left =>PDA collaterals;  RHC (10/16/14): RA 20, RV 69/11, PA 69/36 (35), PCWP 35, PA sat: 46% on 100% NRB, Fick  CO/CI: 3.15/1.43, Thermo CO/CI: 3.61/1.63 ECG (11/15): atrial fibrillation with LBBB, QRS 132 msec.  ECHO (01/2015): EF 25-30%, mild MR.   ROS: All systems negative except as listed in HPI, PMH and Problem List.  SH:  History   Social History  . Marital Status: Married    Spouse Name: N/A  . Number of Children: N/A  . Years of Education: N/A   Occupational History  . Not on file.   Social History Main Topics  . Smoking status: Never Smoker   . Smokeless tobacco: Never Used  . Alcohol Use: No  . Drug Use: No  . Sexual Activity: Not Currently   Other Topics Concern  . Not on file   Social History Narrative    FH:  Family History  Problem Relation Age of Onset  . Heart disease Mother   . Heart disease Father   . Heart disease Brother   . Heart disease Maternal Aunt   . Cancer      Past Medical History  Diagnosis Date  . Pleuritis     h/o------ with negative vats biopsy  . Coronary artery disease     a. CABG 1998. b. NSTEMI 02/2013: Progression of dz in SVG-RPDA followed by severe stenosis in grafted vessel, not likely safely reacted for PCI - med rx. c. NSTEMI 10/2014: patent LIMA-LAD and SVG to OM, TO SVG-PDA with TO PDA and 60% proximal RCA, some left =>PDA collaterals; culprit was likely occlusion of SVG-PDA though this  may have been subacute given some collaterals.  . 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 Chronic anticoagulation, on coumadin 01/12/2013  . Cardiomyopathy, ischemic EF 25%   . Hypothyroidism   . GERD (gastroesophageal reflux disease)   . Chronic atrial fibrillation   . Digitalis toxicity   . Chronic systolic CHF (congestive heart failure)     a. EF prev 25%, improved to 45-50% in 2014. b. 10/2014: EF back downt o 25%.  [name] Hyperlipidemia   . LBBB (left bundle branch block)   . CKD (chronic kidney disease), stage III   . Long term current use of systemic steroids     a. For RA.  [name] Spinal stenosis   . Hip  pain     a. adductor muscle tears on prior MRI.  [name] Chronic chest pain     a. Intolerant of Ranexa.  . Hyperkalemia     a. Spironolactone d/c'd due to this.  . Elevated hemidiaphragm   . Moderate mitral regurgitation 10/2014 echo  . MI (myocardial infarction)     "10/14/2014 I was told I'd had 2 MI's & didn't even know it" (11/27/2014)  . Pneumonia 10/14/2014  . Type II diabetes mellitus   . Arthritis     all over  . Rheumatoid arthritis   . Chronic back pain     "all up and down my back"  . 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 aspirin EC 81 MG tablet Take 81 mg by mouth daily.    . calcium carbonate (OS-CAL) 600 MG TABS Take 600 mg by mouth at bedtime.     . Diclofenac Sodium 3 % GEL Place 1 application onto the skin daily as needed (PAIN).     . fluticasone (FLONASE) 50 MCG/ACT nasal spray Place 2 sprays into both nostrils daily.     . hydrALAZINE (APRESOLINE) 25 MG tablet Take 1.5 tablets (37.5 mg total) by mouth 3 (three) times daily. 135 tablet 3  . isosorbide mononitrate (IMDUR) 60 MG 24 hr tablet Take 1.5 tablets (90 mg total) by mouth daily. 45 tablet 3  . levothyroxine (SYNTHROID, LEVOTHROID) 175 MCG tablet Take 175 mcg by mouth daily before breakfast.    . metoprolol succinate (TOPROL-XL) 100 MG 24 hr tablet Take 1 tablet (100 mg total) by mouth 2 (two) times daily. Take with or immediately following a meal. 60 tablet 3  . NITROSTAT 0.4 MG SL tablet PLACE 1 TABLET (0.4 MG TOTAL) UNDER THE TONGUE EVERY 5 (FIVE) MINUTES AS NEEDED FOR CHEST PAIN. 100 tablet 1  . nystatin cream (MYCOSTATIN) Apply 1 application topically daily as needed for dry skin.   4  . Omega-3 Fatty Acids (FISH OIL) 1000 MG CAPS Take 3,000 mg by mouth every morning.    [name] omeprazole (PRILOSEC) 20 MG capsule Take 20 mg by mouth daily.    [name] oxyCODONE-acetaminophen (PERCOCET/ROXICET) 5-325 MG  per tablet Take 1-2 tablets by mouth every 4 (four) hours as needed for severe pain.    . polyethylene glycol (MIRALAX / GLYCOLAX) packet Take 17 g by mouth daily as needed. (Patient taking differently: Take 17 g by mouth daily as needed (CONSTIPATION). ) 14 each 0  . predniSONE (DELTASONE) 10 MG tablet Take 5 mg by mouth 2 (two) times daily with a meal.     . ranolazine (RANEXA) 500  MG 12 hr tablet Take 1 tablet (500 mg total) by mouth 2 (two) times daily. 60 tablet 3  . rosuvastatin (CRESTOR) 20 MG tablet Take 1 tablet (20 mg total) by mouth daily. 30 tablet 0  . saxagliptin HCl (ONGLYZA) 5 MG TABS tablet Take 5 mg by mouth daily.    [name] senna (SENOKOT) 8.6 MG TABS tablet Take 1 tablet (8.6 mg total) by mouth daily. 120 each 0  . torsemide (DEMADEX) 20 MG tablet Take 2 tablets (40 mg total) by mouth daily. (Patient taking differently: Take 20 mg by mouth 2 (two) times daily. ) 180 tablet 1  . TOUJEO SOLOSTAR 300 UNIT/ML SOPN Inject 32 Units into the skin every morning.   4  . triamcinolone cream (KENALOG) 0.1 % Apply 1 application topically daily as needed (for rash).     . warfarin (COUMADIN) 5 MG tablet Take 1-2 tablets by mouth daily as directed (Patient taking differently: Take 7.5-10 mg by mouth daily at 6 PM. 5mg  on Friday and 7.5mg  all other days) 135 tablet 2   No current facility-administered medications for this encounter.    Filed Vitals:   05/10/15 0949  BP: 118/78  Pulse: 84  Weight: 214 lb 12.8 oz (97.433 kg)  SpO2: 94%    PHYSICAL EXAM: General:No resp difficulty, in wheelchair, wife and son present HEENT: normal Neck: [name]. JVP 7. Carotids 2+ bilaterally; no bruits. No lymphadenopathy or thryomegaly appreciated. Cor: PMI normal. Heart irregular S1/S2, no S3/S4, 1/6 HSM at apex.  Lungs: Clear   Abdomen: soft, nontender, nondistended. No hepatosplenomegaly. No bruits or masses. Good bowel sounds. Extremities: no cyanosis, clubbing, rash.  Trace ankle edema bilaterally.   Neuro: alert & orientedx3, cranial nerves grossly intact. Moves all 4 extremities w/o difficulty. Affect pleasant.  ASSESSMENT & PLAN: 1) Chronic systolic heart failure: EF 25% (01/2015), ischemic cardiomyopathy s/p CABG.  Patient was admitted to the hospital in 11/15 for acute/chronic systolic CHF with cardiogenic shock in the setting of NSTEMI. He had IABP placed and was diuresed with IV lasix and milrinone. He was weaned off milrinone.  He is very limited by orthopedic problems and does very little walking. NYHA class III symptoms, not just due to CHF (also orthopedic-related).  Volume looks ok today.  St Jude CRT-D system placed in 5/16.  - Continue torsemide at current dose.  - Continue current Toprol XL.  - Continue current hydralazine/Imdur - Not on spironolactone or ACEI due to hyperkalemia.  I am going to try him back on a low dose of spironolactone, 12.5 mg daily.  BMET in 1 week.  - No digoxin due to history of digoxin toxicity.  - Reinforced the need and importance of daily weights, a low sodium diet, and fluid restriction (less than 2 L a day). Instructed to call the HF clinic if weight increases more than 3 lbs overnight or 5 lbs in a week.  2) CAD s/p CABG: Culprit for NSTEMI in 11/15 was likely occlusion of SVG-PDA.  There was no good target for intervention. Since 11/15, he had been having exertional chest pain with relatively minimal exertion (25-30 feet).  At last appointment, I started him on ranolazine 500 mg bid.  This seems to have helped a lot. He has used only 1 NTG since last appointment. - Continue ASA, Imdur, Toprol XL, and statin.  - Continue ranolazine, will see if we can find him samples. 3) Dyspnea:  Probably due to combination of deconditioning, systolic CHF, and elevated left hemidiaphragm.  He does not have significant COPD, and he did not have interstitial lung disease on last chest CT.     4) Atrial fibrillation: Chronic. HR controlled, will continue Toprol and  warfarin.   5) Immobility: Continue to try and be as active as possible.  Follow up in 3 wks.  Marca Ancona 05/10/2015

## 2015-05-16 ENCOUNTER — Ambulatory Visit (INDEPENDENT_AMBULATORY_CARE_PROVIDER_SITE_OTHER): Payer: Medicare Other | Admitting: *Deleted

## 2015-05-16 ENCOUNTER — Ambulatory Visit (INDEPENDENT_AMBULATORY_CARE_PROVIDER_SITE_OTHER): Payer: Medicare Other | Admitting: Pharmacist Clinician (PhC)/ Clinical Pharmacy Specialist

## 2015-05-16 ENCOUNTER — Ambulatory Visit (HOSPITAL_COMMUNITY)
Admission: RE | Admit: 2015-05-16 | Discharge: 2015-05-16 | Disposition: A | Discharge status: Home / Self Care | Payer: Medicare Other | Source: Ambulatory Visit | Attending: Internal Medicine | Admitting: Internal Medicine

## 2015-05-16 DIAGNOSIS — I447 Left bundle-branch block, unspecified: Secondary | ICD-10-CM | POA: Diagnosis not present

## 2015-05-16 DIAGNOSIS — I4821 Permanent atrial fibrillation: Secondary | ICD-10-CM

## 2015-05-16 DIAGNOSIS — I255 Ischemic cardiomyopathy: Secondary | ICD-10-CM

## 2015-05-16 DIAGNOSIS — Z95810 Presence of automatic (implantable) cardiac defibrillator: Secondary | ICD-10-CM

## 2015-05-16 DIAGNOSIS — I5023 Acute on chronic systolic (congestive) heart failure: Secondary | ICD-10-CM

## 2015-05-16 DIAGNOSIS — I5022 Chronic systolic (congestive) heart failure: Secondary | ICD-10-CM

## 2015-05-16 DIAGNOSIS — Z7901 Long term (current) use of anticoagulants: Secondary | ICD-10-CM

## 2015-05-16 DIAGNOSIS — I482 Chronic atrial fibrillation: Secondary | ICD-10-CM

## 2015-05-16 DIAGNOSIS — R0602 Shortness of breath: Secondary | ICD-10-CM | POA: Diagnosis present

## 2015-05-16 DIAGNOSIS — I472 Ventricular tachycardia: Secondary | ICD-10-CM

## 2015-05-16 DIAGNOSIS — I4729 Other ventricular tachycardia: Secondary | ICD-10-CM

## 2015-05-16 LAB — CUP PACEART INCLINIC DEVICE CHECK
Battery Remaining Longevity: 80.4 mo
Brady Statistic RA Percent Paced: 0 %
Brady Statistic RV Percent Paced: 22 %
HIGH POWER IMPEDANCE MEASURED VALUE: 66.375
Lead Channel Impedance Value: 362.5 Ohm
Lead Channel Impedance Value: 487.5 Ohm
Lead Channel Pacing Threshold Amplitude: 0.75 V
Lead Channel Pacing Threshold Amplitude: 2 V
Lead Channel Pacing Threshold Pulse Width: 0.4 ms
Lead Channel Pacing Threshold Pulse Width: 0.4 ms
Lead Channel Pacing Threshold Pulse Width: 1 ms
Lead Channel Pacing Threshold Pulse Width: 1 ms
Lead Channel Sensing Intrinsic Amplitude: 1.5 mV
Lead Channel Setting Pacing Amplitude: 3.5 V
Lead Channel Setting Pacing Amplitude: 3.5 V
Lead Channel Setting Pacing Amplitude: 3.5 V
Lead Channel Setting Pacing Pulse Width: 0.4 ms
Lead Channel Setting Pacing Pulse Width: 0.7 ms
MDC IDC MSMT LEADCHNL LV PACING THRESHOLD AMPLITUDE: 2 V
MDC IDC MSMT LEADCHNL RV IMPEDANCE VALUE: 487.5 Ohm
MDC IDC MSMT LEADCHNL RV PACING THRESHOLD AMPLITUDE: 0.75 V
MDC IDC MSMT LEADCHNL RV SENSING INTR AMPL: 12 mV
MDC IDC SESS DTM: 20160602145800
MDC IDC SET LEADCHNL RV SENSING SENSITIVITY: 0.5 mV
MDC IDC SET ZONE DETECTION INTERVAL: 250 ms
Pulse Gen Serial Number: 7272291
Zone Setting Detection Interval: 300 ms
Zone Setting Detection Interval: 350 ms

## 2015-05-16 LAB — POCT INR: INR: 1.8

## 2015-05-17 ENCOUNTER — Encounter: Payer: Self-pay | Admitting: Cardiology

## 2015-05-17 ENCOUNTER — Ambulatory Visit: Payer: Medicare Other | Admitting: Pharmacist Clinician (PhC)/ Clinical Pharmacy Specialist

## 2015-05-17 NOTE — Progress Notes (Signed)
Wound check appointment. Steri-strips removed. Wound without redness or edema. Incision edges approximated, wound well healed. Suture removed from L corner of incision. Normal device function. Thresholds, sensing, and impedances consistent with implant measurements. LV threshold increase by 1.0V noted--cxr ordered, pulse width increased to 1.60ms. Device programmed at 3.5V for extra safety margin until 3 month visit. Histogram distribution appropriate for patient and level of activity. Persistent AF + Warfarin. No ventricular arrhythmias noted. Patient educated about wound care, arm mobility, lifting restrictions, shock plan. ROV w/ AS on 7/6 @ 1530.

## 2015-05-28 ENCOUNTER — Ambulatory Visit (INDEPENDENT_AMBULATORY_CARE_PROVIDER_SITE_OTHER): Payer: Medicare Other | Admitting: Pharmacist Clinician (PhC)/ Clinical Pharmacy Specialist

## 2015-05-28 DIAGNOSIS — I482 Chronic atrial fibrillation: Secondary | ICD-10-CM

## 2015-05-28 DIAGNOSIS — I4821 Permanent atrial fibrillation: Secondary | ICD-10-CM

## 2015-05-28 DIAGNOSIS — Z7901 Long term (current) use of anticoagulants: Secondary | ICD-10-CM

## 2015-05-28 LAB — POCT INR: INR: 2.8

## 2015-06-05 ENCOUNTER — Encounter: Payer: Self-pay | Admitting: Internal Medicine

## 2015-06-10 ENCOUNTER — Other Ambulatory Visit: Payer: Self-pay

## 2015-06-19 ENCOUNTER — Other Ambulatory Visit: Payer: Self-pay | Admitting: *Deleted

## 2015-06-19 ENCOUNTER — Ambulatory Visit (INDEPENDENT_AMBULATORY_CARE_PROVIDER_SITE_OTHER): Payer: Medicare Other | Admitting: Nurse Practitioner

## 2015-06-19 ENCOUNTER — Ambulatory Visit (INDEPENDENT_AMBULATORY_CARE_PROVIDER_SITE_OTHER): Payer: Medicare Other | Admitting: Pharmacist Clinician (PhC)/ Clinical Pharmacy Specialist

## 2015-06-19 ENCOUNTER — Ambulatory Visit (INDEPENDENT_AMBULATORY_CARE_PROVIDER_SITE_OTHER): Payer: Medicare Other | Admitting: Cardiology

## 2015-06-19 ENCOUNTER — Ambulatory Visit: Payer: Medicare Other | Admitting: Cardiology

## 2015-06-19 ENCOUNTER — Encounter: Payer: Self-pay | Admitting: Internal Medicine

## 2015-06-19 ENCOUNTER — Encounter: Payer: Self-pay | Admitting: Nurse Practitioner

## 2015-06-19 ENCOUNTER — Encounter: Payer: Self-pay | Admitting: Cardiology

## 2015-06-19 VITALS — BP 108/64 | HR 60 | Ht 72.0 in | Wt 215.2 lb

## 2015-06-19 VITALS — BP 138/84 | HR 102 | Ht 72.0 in | Wt 215.0 lb

## 2015-06-19 DIAGNOSIS — I4821 Permanent atrial fibrillation: Secondary | ICD-10-CM

## 2015-06-19 DIAGNOSIS — I482 Chronic atrial fibrillation: Secondary | ICD-10-CM

## 2015-06-19 DIAGNOSIS — Z95810 Presence of automatic (implantable) cardiac defibrillator: Secondary | ICD-10-CM

## 2015-06-19 DIAGNOSIS — N183 Chronic kidney disease, stage 3 unspecified: Secondary | ICD-10-CM

## 2015-06-19 DIAGNOSIS — I447 Left bundle-branch block, unspecified: Secondary | ICD-10-CM | POA: Diagnosis not present

## 2015-06-19 DIAGNOSIS — I5022 Chronic systolic (congestive) heart failure: Secondary | ICD-10-CM

## 2015-06-19 DIAGNOSIS — I255 Ischemic cardiomyopathy: Secondary | ICD-10-CM

## 2015-06-19 DIAGNOSIS — Z7901 Long term (current) use of anticoagulants: Secondary | ICD-10-CM | POA: Diagnosis not present

## 2015-06-19 LAB — CUP PACEART INCLINIC DEVICE CHECK
Date Time Interrogation Session: 20160706160228
Pulse Gen Serial Number: 7272291

## 2015-06-19 LAB — POCT INR: INR: 2.2

## 2015-06-19 MED ORDER — METOPROLOL SUCCINATE ER 100 MG PO TB24: ORAL_TABLET | ORAL | Status: DC

## 2015-06-19 MED ORDER — RANOLAZINE ER 500 MG PO TB12: 500.0000 mg | ORAL_TABLET | Freq: Two times a day (BID) | ORAL | Status: DC

## 2015-06-19 NOTE — Progress Notes (Signed)
Electrophysiology Office Note Date: 06/19/2015  ID:  [name], DOB 10-Apr-1941, MRN [id number]  PCP: Thayer Headings, MD Primary Cardiologist: [name] Electrophysiologist: Ladona Ridgel  CC: 6 week CRT visit  [name] is a 74 y.o. male seen today for Dr Ladona Ridgel. He underwent CRTD placement in 04/2015 and presents today for follow-up.  Since device placement, the patient reports doing reasonably well.  He has persistent shortness of breath with exertion and weakness.  His angina is markedly improved with the addition of Ranexa.  He denies palpitations, nausea, vomiting, dizziness, syncope, edema, weight gain, or early satiety.  He has not had ICD shocks.   Device History: STJ CRTD implanted 2016 for ICM, CHF, LBBB History of appropriate therapy: No History of AAD therapy: No   Past Medical History  Diagnosis Date  . Pleuritis     h/o------ with negative vats biopsy  . Coronary artery disease     a. CABG 1998. b. NSTEMI 02/2013: Progression of dz in SVG-RPDA followed by severe stenosis in grafted vessel, not likely safely reacted for PCI - med rx. c. NSTEMI 10/2014: patent LIMA-LAD and SVG to OM, TO SVG-PDA with TO PDA and 60% proximal RCA, some left =>PDA collaterals; culprit was likely occlusion of SVG-PDA though this may have been subacute given some collaterals.  . 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 Chronic anticoagulation, on coumadin 01/12/2013  . Cardiomyopathy, ischemic EF 25%   . Hypothyroidism   . GERD (gastroesophageal reflux disease)   . Chronic atrial fibrillation   . Digitalis toxicity   . Chronic systolic CHF (congestive heart failure)     a. EF prev 25%, improved to 45-50% in 2014. b. 10/2014: EF back downt o 25%.  [name] Hyperlipidemia   . LBBB (left bundle branch block)   . CKD (chronic kidney disease), stage III   . Long term current use of systemic steroids     a. For RA.  [name] Spinal stenosis   . Hip pain     a.  adductor muscle tears on prior MRI.  [name] Chronic chest pain     a. Intolerant of Ranexa.  . Hyperkalemia     a. Spironolactone d/c'd due to this.  . Elevated hemidiaphragm   . Moderate mitral regurgitation 10/2014 echo  . MI (myocardial infarction)     "10/14/2014 I was told I'd had 2 MI's & didn't even know it" (11/27/2014)  . Type II diabetes mellitus   . Rheumatoid arthritis   . 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 Lung surgery  1994    "infection in lungs; scraped it out on right; 6 wks later they did left; clipped left diaphragm & paralyzed it"  . Total knee arthroplasty Bilateral 2000's  . Carpal tunnel release Bilateral 1990's  . Cataract extraction w/ intraocular lens  implant, bilateral Bilateral 1990's  . Cardiac catheterization Left 01/11/2013    Severe CAD with notable progression of disease in the SVG-RPDA followed by severe stenosis in grafted vessel, region not safely "reachede" for PCI, moderate ischemic CM no notable chage in EF, continue medical therapy  . Cardiac catheterization  04/03/2011    Left circumflex 95% stenosis, essentially unchanged from last cath  . Cardiac catheterization  04/01/2010    LAD 99% proximal stenosis with vague flow, circumflex 80% stenosis, essentially unchanged form last cath  .  Cardiac catheterization  03/28/2010    Mild pulmonary hypertension, medical therapy  . Cardiac catheterization  04/30/2009    LAD 95% stenosis, essentially unchanged fomr last cath, medical therapy  . Cardiac catheterization  12/05/2008    2.5x12 apex, predilation in the distal PLA branch, 3x23 XIENCE V durg eluting stent and post dilated with a 3.25x20 [place] VOYAGER at 15 atmospheres  . Left heart catheterization with coronary/graft angiogram N/A 01/13/2013    Procedure: LEFT HEART CATHETERIZATION WITH Isabel Caprice;  Surgeon: Marykay Lex, MD;  Location:  [facility] CATH LAB;  Service: Cardiovascular;  Laterality: N/A;  . Left and right heart catheterization with coronary/graft angiogram N/A 10/16/2014    Procedure: LEFT AND RIGHT HEART CATHETERIZATION WITH Isabel Caprice;  Surgeon: Marykay Lex, MD;  Location: [facility] CATH LAB;  Service: Cardiovascular;  Laterality: N/A;  . Coronary artery bypass graft  05/1997    "CABG X7"  . Patella fracture surgery Right "before replacement"  . Knee arthroscopy Left "before replacement  . Ep implantable device N/A 05/06/2015    Procedure: BiV ICD Insertion CRT-D;  Surgeon: Marinus Maw, MD;  Location: [facility] INVASIVE CV LAB;  Service: Cardiovascular;  Laterality: N/A;    Current Outpatient Prescriptions  Medication Sig Dispense Refill  . aspirin EC 81 MG tablet Take 81 mg by mouth daily.    . calcium carbonate (OS-CAL) 600 MG TABS Take 600 mg by mouth at bedtime.     . fluticasone (FLONASE) 50 MCG/ACT nasal spray Place 2 sprays into both nostrils daily.     . hydrALAZINE (APRESOLINE) 25 MG tablet Take 1.5 tablets (37.5 mg total) by mouth 3 (three) times daily. 135 tablet 3  . isosorbide mononitrate (IMDUR) 60 MG 24 hr tablet Take 1.5 tablets (90 mg total) by mouth daily. 45 tablet 3  . levothyroxine (SYNTHROID, LEVOTHROID) 175 MCG tablet Take 175 mcg by mouth daily before breakfast.    . metoprolol succinate (TOPROL-XL) 100 MG 24 hr tablet Take 1 tablet (100 mg total) by mouth 2 (two) times daily. Take with or immediately following a meal. 60 tablet 3  . NITROSTAT 0.4 MG SL tablet PLACE 1 TABLET (0.4 MG TOTAL) UNDER THE TONGUE EVERY 5 (FIVE) MINUTES AS NEEDED FOR CHEST PAIN. 100 tablet 1  . nystatin cream (MYCOSTATIN) Apply 1 application topically daily as needed for dry skin.   4  . Omega-3 Fatty Acids (FISH OIL) 1000 MG CAPS Take 3,000 mg by mouth every morning.    [name] omeprazole (PRILOSEC) 20 MG capsule Take 20 mg by mouth daily.    [name] oxyCODONE-acetaminophen (PERCOCET/ROXICET) 5-325 MG per tablet Take 1-2  tablets by mouth every 4 (four) hours as needed for severe pain.    . polyethylene glycol (MIRALAX / GLYCOLAX) packet Take 17 g by mouth daily as needed. (Patient taking differently: Take 17 g by mouth daily as needed (CONSTIPATION). ) 14 each 0  . predniSONE (DELTASONE) 5 MG tablet Take 1 tablet by mouth 2 (two) times daily.  1  . ranolazine (RANEXA) 500 MG 12 hr tablet Take 1 tablet (500 mg total) by mouth 2 (two) times daily. 56 tablet 0  . rosuvastatin (CRESTOR) 20 MG tablet Take 1 tablet (20 mg total) by mouth daily. 30 tablet 0  . saxagliptin HCl (ONGLYZA) 5 MG TABS tablet Take 5 mg by mouth daily.    [name] senna (SENOKOT) 8.6 MG TABS tablet Take 1 tablet (8.6 mg total) by mouth daily. 120 each 0  . torsemide (DEMADEX)  20 MG tablet Take 2 tablets (40 mg total) by mouth daily. (Patient taking differently: Take 20 mg by mouth 2 (two) times daily. ) 180 tablet 1  . TOUJEO SOLOSTAR 300 UNIT/ML SOPN Inject 32 Units into the skin every morning.   4  . triamcinolone cream (KENALOG) 0.1 % Apply 1 application topically daily as needed (for rash).     . warfarin (COUMADIN) 5 MG tablet Take 1-2 tablets by mouth daily as directed (Patient taking differently: Take 7.5-10 mg by mouth daily at 6 PM. 5mg  on Friday and 7.5mg  all other days) 135 tablet 2   No current facility-administered medications for this visit.    Allergies:   Butrans; Influenza vaccines; Lanoxin; Ranexa; Lisinopril; Clarithromycin; and Statins   Social History: History   Social History  . Marital Status: Married    Spouse Name: N/A  . Number of Children: N/A  . Years of Education: N/A   Occupational History  . Not on file.   Social History Main Topics  . Smoking status: Never Smoker   . Smokeless tobacco: Never Used  . Alcohol Use: No  . Drug Use: No  . Sexual Activity: Not Currently   Other Topics Concern  . Not on file   Social History Narrative    Family History: Family History  Problem Relation Age of Onset  .  Heart disease Mother   . Heart disease Father   . Heart disease Brother   . Heart disease Maternal Aunt   . Cancer      Review of Systems: All other systems reviewed and are otherwise negative except as noted above.   Physical Exam: VS:  BP 138/84 mmHg  Pulse 102  Ht 6' (1.829 m)  Wt 215 lb (97.523 kg)  BMI 29.15 kg/m2 , BMI Body mass index is 29.15 kg/(m^2).  GEN- The patient is elderly and chronically ill appearing, alert and oriented x 3 today.   HEENT: normocephalic, atraumatic; sclera clear, conjunctiva pink; hearing intact; oropharynx clear; neck supple, no JVP Lymph- no cervical lymphadenopathy Lungs- Clear to ausculation bilaterally, normal work of breathing.  No wheezes, rales, rhonchi Heart- Tachycardic irregular rate and rhythm  GI- soft, non-tender, non-distended, bowel sounds present  Extremities- no clubbing, cyanosis, or edema; DP/PT/radial pulses 2+ bilaterally MS- no significant deformity or atrophy Skin- warm and dry, no rash or lesion; ICD pocket well healed Psych- euthymic mood, full affect Neuro- strength and sensation are intact  ICD interrogation- reviewed in detail today,  See PACEART report  EKG:  EKG is ordered today. The ekg ordered today shows atrial fibrillation with RVR, ventricular rate 102  Recent Labs: 10/14/2014: ALT 46 10/17/2014: Magnesium 1.9 12/19/2014: Pro B Natriuretic peptide (BNP) 4338.00* 04/19/2015: B Natriuretic Peptide 573.6* 04/25/2015: Hemoglobin 14.6; Platelets 164.0 05/06/2015: BUN 24*; Creatinine, Ser 1.58*; Potassium 4.0; Sodium 133*   Wt Readings from Last 3 Encounters:  06/19/15 215 lb (97.523 kg)  06/19/15 215 lb 3.2 oz (97.614 kg)  05/10/15 214 lb 12.8 oz (97.433 kg)     Other studies Reviewed: Additional studies/ records that were reviewed today include: Dr Ladona Ridgel and McLean's office notes, hospital records  Assessment and Plan:  1.  Chronic systolic dysfunction euvolemic today Stable on an appropriate medical  regimen - Spironolactone added at last visit with Dr [name] - recent labs with stable K, creat Normal ICD function See Pace Art report No changes today  2.  CAD/ICM Angina improved with Ranexa Continue current therapy  3.  Permanent atrial  fibrillation Continue Warfarin for CHADS2VASC of at least 5 V rates are poorly controlled CRT pacing only 26% 2/2 RVR - will increase Metoprolol to 150mg  twice daily.  I think he would benefit form AVN ablation - will ask Dr to review at office visit in 6 weeks.   Current medicines are reviewed at length with the patient today.   The patient does not have concerns regarding his medicines.  The following changes were made today:  Increase Metoprolol to 150mg  twice daily  Labs/ tests ordered today include:  Orders Placed This Encounter  Procedures  . Implantable device check    Disposition:   Follow up with Dr Ladona Ridgel in 6 weeks, Dr as scheduled     Signed, Ladona Ridgel, NP 06/19/2015 3:58 PM  [facility] [address] [place] [phone] (office) [phone] (fax)

## 2015-06-19 NOTE — Patient Instructions (Signed)
Your physician recommends that you schedule a follow-up appointment [name] in 6 months or sooner if needed. No changes were made today in your therapy.

## 2015-06-19 NOTE — Progress Notes (Signed)
Cardiology Office Note   Date:  06/19/2015   ID:  [name], DOB [birth year], MRN [id number]  PCP:  Thressa Sheller, MD  Cardiologist:  Dr. Adora Fridge     Chief Complaint  Patient presents with  . Coronary Artery Disease    c/o some chest pain, no swelling in legs, some SOB, and no dizziness      History of Present Illness: [name] is a 74 y.o. male who presents for CAD.  He has a history of HTN, DM2, HLD, CAD s/p CABG 1998, ischemic cardiomyopathy, chronic angina, chronic atrial fibrillation, LBBB, rheumatoid arthritis on chronic steroids, and severe spinal stenosis. He has a chronically elevated left hemidiaphragm.  05/06/15 had  implantation of a STJ CRTD  (BiVICD) with an ischemic CM (EF 25), NYHA Class III CHF, and LBBB QRS morophology.  He met MADIT II/ SCD-HeFT criteria for ICD implantation for primary prevention of sudden death .    Today he is feeling well.  Rare chest pain since back on Ranexa. When he does associated with SOB and relief with NTG.  His weight at home is stable 210-212 lbs.  He is monitoring his salt intake as well.  He has been doing well since he saw Dr. Aundra Dubin.   He goes out with family assistance to church, rare out to dinner and shopping on occasion.   Lipids followed by Dr. Alyson Ingles, we called his office for copy of recent labs.  Discussed with pt and family that if following with Dr. Aundra Dubin then may only need to Dr. Gwenlyn Found once a year they will ask Dr. Aundra Dubin at next visit.   Past Medical History  Diagnosis Date  . Pleuritis     h/o------ with negative vats biopsy  . Coronary artery disease     a. CABG 1998. b. NSTEMI 02/2013: Progression of dz in SVG-RPDA followed by severe stenosis in grafted vessel, not likely safely reacted for PCI - med rx. c. NSTEMI 10/2014: patent LIMA-LAD and SVG to OM, TO SVG-PDA with TO PDA and 60% proximal RCA, some left =>PDA collaterals; culprit was likely occlusion of SVG-PDA though this may have been subacute  given some collaterals.  . 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 Chronic anticoagulation, on coumadin 01/12/2013  . Cardiomyopathy, ischemic EF 25%   . Hypothyroidism   . GERD (gastroesophageal reflux disease)   . Chronic atrial fibrillation   . Digitalis toxicity   . Chronic systolic CHF (congestive heart failure)     a. EF prev 25%, improved to 45-50% in 2014. b. 10/2014: EF back downt o 25%.  [name] Hyperlipidemia   . LBBB (left bundle branch block)   . CKD (chronic kidney disease), stage III   . Long term current use of systemic steroids     a. For RA.  [name] Spinal stenosis   . Hip pain     a. adductor muscle tears on prior MRI.  [name] Chronic chest pain     a. Intolerant of Ranexa.  . Hyperkalemia     a. Spironolactone d/c'd due to this.  . Elevated hemidiaphragm   . Moderate mitral regurgitation 10/2014 echo  . MI (myocardial infarction)     "10/14/2014 I was told I'd had 2 MI's & didn't even know it" (11/27/2014)  . Type II diabetes mellitus   . Rheumatoid arthritis   . 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 Lung surgery  1994    "infection in lungs; scraped it out on right; 6 wks later they did left; clipped left diaphragm & paralyzed it"  . Total knee arthroplasty Bilateral 2000's  . Carpal tunnel release Bilateral 1990's  . Cataract extraction w/ intraocular lens  implant, bilateral Bilateral 1990's  . Cardiac catheterization Left 01/11/2013    Severe CAD with notable progression of disease in the SVG-RPDA followed by severe stenosis in grafted vessel, region not safely "reachede" for PCI, moderate ischemic CM no notable chage in EF, continue medical therapy  . Cardiac catheterization  04/03/2011    Left circumflex 95% stenosis, essentially unchanged from last cath  . Cardiac catheterization  04/01/2010    LAD 99% proximal stenosis  with vague flow, circumflex 80% stenosis, essentially unchanged form last cath  . Cardiac catheterization  03/28/2010    Mild pulmonary hypertension, medical therapy  . Cardiac catheterization  04/30/2009    LAD 95% stenosis, essentially unchanged fomr last cath, medical therapy  . Cardiac catheterization  12/05/2008    2.5x12 apex, predilation in the distal PLA branch, 3x23 XIENCE V durg eluting stent and post dilated with a 3.25x20 [place] VOYAGER at 15 atmospheres  . Left heart catheterization with coronary/graft angiogram N/A 01/13/2013    Procedure: LEFT HEART CATHETERIZATION WITH Beatrix Fetters;  Surgeon: Leonie Man, MD;  Location: [facility] CATH LAB;  Service: Cardiovascular;  Laterality: N/A;  . Left and right heart catheterization with coronary/graft angiogram N/A 10/16/2014    Procedure: LEFT AND RIGHT HEART CATHETERIZATION WITH Beatrix Fetters;  Surgeon: Leonie Man, MD;  Location: [facility] CATH LAB;  Service: Cardiovascular;  Laterality: N/A;  . Coronary artery bypass graft  05/1997    "CABG X7"  . Patella fracture surgery Right "before replacement"  . Knee arthroscopy Left "before replacement  . Ep implantable device N/A 05/06/2015    Procedure: BiV ICD Insertion CRT-D;  Surgeon: Evans Lance, MD;  Location: [place] CV LAB;  Service: Cardiovascular;  Laterality: N/A;     Current Outpatient Prescriptions  Medication Sig Dispense Refill  . aspirin EC 81 MG tablet Take 81 mg by mouth daily.    . calcium carbonate (OS-CAL) 600 MG TABS Take 600 mg by mouth at bedtime.     . fluticasone (FLONASE) 50 MCG/ACT nasal spray Place 2 sprays into both nostrils daily.     . hydrALAZINE (APRESOLINE) 25 MG tablet Take 1.5 tablets (37.5 mg total) by mouth 3 (three) times daily. 135 tablet 3  . isosorbide mononitrate (IMDUR) 60 MG 24 hr tablet Take 1.5 tablets (90 mg total) by mouth daily. 45 tablet 3  . levothyroxine (SYNTHROID, LEVOTHROID) 175 MCG tablet Take 175 mcg by mouth daily  before breakfast.    . NITROSTAT 0.4 MG SL tablet PLACE 1 TABLET (0.4 MG TOTAL) UNDER THE TONGUE EVERY 5 (FIVE) MINUTES AS NEEDED FOR CHEST PAIN. 100 tablet 1  . nystatin cream (MYCOSTATIN) Apply 1 application topically daily as needed for dry skin.   4  . Omega-3 Fatty Acids (FISH OIL) 1000 MG CAPS Take 3,000 mg by mouth every morning.    [name] omeprazole (PRILOSEC) 20 MG capsule Take 20 mg by mouth daily.    [name] oxyCODONE-acetaminophen (PERCOCET/ROXICET) 5-325 MG per tablet Take 1-2 tablets by mouth every 4 (four) hours as needed for severe pain.    . polyethylene glycol (MIRALAX / GLYCOLAX) packet Take 17 g by mouth daily as needed. (  Patient taking differently: Take 17 g by mouth daily as needed (CONSTIPATION). ) 14 each 0  . predniSONE (DELTASONE) 5 MG tablet Take 1 tablet by mouth 2 (two) times daily.  1  . rosuvastatin (CRESTOR) 20 MG tablet Take 1 tablet (20 mg total) by mouth daily. 30 tablet 0  . saxagliptin HCl (ONGLYZA) 5 MG TABS tablet Take 5 mg by mouth daily.    [name] senna (SENOKOT) 8.6 MG TABS tablet Take 1 tablet (8.6 mg total) by mouth daily. 120 each 0  . torsemide (DEMADEX) 20 MG tablet Take 2 tablets (40 mg total) by mouth daily. (Patient taking differently: Take 20 mg by mouth 2 (two) times daily. ) 180 tablet 1  . TOUJEO SOLOSTAR 300 UNIT/ML SOPN Inject 32 Units into the skin every morning.   4  . triamcinolone cream (KENALOG) 0.1 % Apply 1 application topically daily as needed (for rash).     . warfarin (COUMADIN) 5 MG tablet Take 1-2 tablets by mouth daily as directed (Patient taking differently: Take 7.5-10 mg by mouth daily at 6 PM. 47m on Friday and 7.570mall other days) 135 tablet 2  . metoprolol succinate (TOPROL-XL) 100 MG 24 hr tablet TAKE A TABLET AND HALF  TWICE A DAY 60 tablet 3  . ranolazine (RANEXA) 500 MG 12 hr tablet Take 1 tablet (500 mg total) by mouth 2 (two) times daily. 56 tablet 0   No current facility-administered medications for this visit.    Allergies:    Butrans; Influenza vaccines; Lanoxin; Ranexa; Lisinopril; Clarithromycin; and Statins    Social History:  The patient  reports that he has never smoked. He has never used smokeless tobacco. He reports that he does not drink alcohol or use illicit drugs.   Family History:  The patient's family history includes Cancer in an other family member; Heart disease in his brother, father, maternal aunt, and mother.    ROS:  General:no colds or fevers, no weight changes Skin:no rashes or ulcers HEENT:no blurred vision, no congestion CV:see HPI PUL:see HPI GI:no diarrhea constipation or melena, no indigestion GU:no hematuria, no dysuria MS:no joint pain, no claudication Neuro:no syncope, no lightheadedness Endo:+ diabetes fairly stable, no thyroid disease  Wt Readings from Last 3 Encounters:  06/19/15 215 lb (97.523 kg)  06/19/15 215 lb 3.2 oz (97.614 kg)  05/10/15 214 lb 12.8 oz (97.433 kg)     PHYSICAL EXAM: VS:  BP 108/64 mmHg  Pulse 60  Ht 6' (1.829 m)  Wt 215 lb 3.2 oz (97.614 kg)  BMI 29.18 kg/m2 , BMI Body mass index is 29.18 kg/(m^2). General:Pleasant affect, NAD Skin:Warm and dry, brisk capillary refill HEENT:normocephalic, sclera clear, mucus membranes moist Neck:supple, no JVD, no bruits  Heart:S1S2 RRR without murmur, gallup, rub or click Lungs:clear without rales, rhonchi, or wheezes AbDDU:KGURnon tender, + BS, do not palpate liver spleen or masses Ext:no lower ext edema, 2+ pedal pulses, 2+ radial pulses Neuro:alert and oriented, MAE, follows commands, + facial symmetry    EKG:  EKG is NOT ordered today.   Recent Labs: 10/14/2014: ALT 46 10/17/2014: Magnesium 1.9 12/19/2014: Pro B Natriuretic peptide (BNP) 4338.00* 04/19/2015: B Natriuretic Peptide 573.6* 04/25/2015: Hemoglobin 14.6; Platelets 164.0 05/06/2015: BUN 24*; Creatinine, Ser 1.58*; Potassium 4.0; Sodium 133*    Lipid Panel    Component Value Date/Time   CHOL 197 03/09/2013 0435   TRIG 198* 03/09/2013  0435   HDL 42 03/09/2013 0435   CHOLHDL 4.7 03/09/2013 0435   VLDL 40  03/09/2013 0435   Hoagland 115* 03/09/2013 0435       Other studies Reviewed: Additional studies/ records that were reviewed today include: previous office visits. Hospital note. Echo 01/2015: Study Conclusions  - Left ventricle: The cavity size was normal. Wall thickness was normal. Systolic function was severely reduced. The estimated ejection fraction was in the range of 25% to 30%. Severe hypokinesis of the entire myocardium. - Mitral valve: There was mild regurgitation. - Left atrium: The atrium was moderately dilated. - Right atrium: The atrium was mildly to moderately dilated.   ASSESSMENT AND PLAN:   Acute on chronic systolic CHF (congestive heart failure) History of CAD status post coronary artery bypass grafting in 1998. He had a recent cardiac catheterization performed by Dr. Ellyn Hack on November 3 of last year revealing an occluded vein graft to the PDA. His filling pressures were elevated. He did have a intra-aortic balloon pump placed. His ejection fraction 25% by 2-D echo. He is followed in the heart failure clinic. He is on appropriate heart failure medications. Dr. Gwenlyn Found and Dr.McLean had discussed BIV ICD with pt and pt referred to Dr. Lovena Le, device placed 05/06/15.  He will wee Dr. Aundra Dubin back in several weeks with follow up echo in 3 months.    Atrial fibrillation, permanent History of chronic atrophic fibrillation on Coumadin at the coagulation followed here in our office. Checked today.  He is on metoprolol for rate control.  Paralyzed hemidiaphragm Patient has a paralyzed left hemidiaphragm as a result of surgery performed in 1994. His pulmonologist is Dr. Gwenette Greet .   Essential hypertension History of hypertension blood pressure measured today at 108/64. He is on hydralazine, metoprolol. Continue current meds at current dosing   DYSLIPIDEMIA History of hyperlipidemia on Crestor 20  mg a day followed by his PCP  CAD with hx CABG as noted above, now on ranexa with much improved episodes of angina.    Current medicines are reviewed with the patient today.  The patient Has no concerns regarding medicines.  The following changes have been made:  See above Labs/ tests ordered today include:see above  Disposition:   FU:  see above  Lennie Muckle, NP  06/19/2015 10:52 PM    [facility] [place], [place], [place], [place] Phone: [phone]; Fax: [phone]

## 2015-06-19 NOTE — Patient Instructions (Signed)
Medication Instructions:   Your physician recommends that you continue on your current medications as directed.EXCEPT METOPROLOL   TAKE A TABLET AND HALF TWICE A DAY (150 MG )  Labwork:   Testing/Procedures:   Follow-Up:  FOLLOW UP IN 4 WEEKS IN HEART FAILURE CLINIC WITH DR [facility]   Any Other Special Instructions Will Be Listed Below (If Applicable).

## 2015-07-17 ENCOUNTER — Ambulatory Visit (HOSPITAL_COMMUNITY)
Admission: RE | Admit: 2015-07-17 | Discharge: 2015-07-17 | Disposition: A | Discharge status: Home / Self Care | Payer: Medicare Other | Source: Ambulatory Visit | Attending: Internal Medicine | Admitting: Internal Medicine

## 2015-07-17 ENCOUNTER — Ambulatory Visit (INDEPENDENT_AMBULATORY_CARE_PROVIDER_SITE_OTHER): Payer: Medicare Other | Admitting: Pharmacist Clinician (PhC)/ Clinical Pharmacy Specialist

## 2015-07-17 ENCOUNTER — Telehealth (HOSPITAL_COMMUNITY): Payer: Self-pay | Admitting: *Deleted

## 2015-07-17 ENCOUNTER — Ambulatory Visit: Payer: Medicare Other | Admitting: Pharmacist Clinician (PhC)/ Clinical Pharmacy Specialist

## 2015-07-17 VITALS — BP 94/68 | HR 73 | Wt 215.8 lb

## 2015-07-17 DIAGNOSIS — M069 Rheumatoid arthritis, unspecified: Secondary | ICD-10-CM | POA: Diagnosis not present

## 2015-07-17 DIAGNOSIS — Z951 Presence of aortocoronary bypass graft: Secondary | ICD-10-CM | POA: Insufficient documentation

## 2015-07-17 DIAGNOSIS — Z95810 Presence of automatic (implantable) cardiac defibrillator: Secondary | ICD-10-CM | POA: Diagnosis not present

## 2015-07-17 DIAGNOSIS — Z7901 Long term (current) use of anticoagulants: Secondary | ICD-10-CM | POA: Diagnosis not present

## 2015-07-17 DIAGNOSIS — I252 Old myocardial infarction: Secondary | ICD-10-CM | POA: Diagnosis not present

## 2015-07-17 DIAGNOSIS — K219 Gastro-esophageal reflux disease without esophagitis: Secondary | ICD-10-CM | POA: Diagnosis not present

## 2015-07-17 DIAGNOSIS — I5022 Chronic systolic (congestive) heart failure: Secondary | ICD-10-CM

## 2015-07-17 DIAGNOSIS — Z8249 Family history of ischemic heart disease and other diseases of the circulatory system: Secondary | ICD-10-CM | POA: Insufficient documentation

## 2015-07-17 DIAGNOSIS — I25810 Atherosclerosis of coronary artery bypass graft(s) without angina pectoris: Secondary | ICD-10-CM

## 2015-07-17 DIAGNOSIS — I129 Hypertensive chronic kidney disease with stage 1 through stage 4 chronic kidney disease, or unspecified chronic kidney disease: Secondary | ICD-10-CM | POA: Insufficient documentation

## 2015-07-17 DIAGNOSIS — E039 Hypothyroidism, unspecified: Secondary | ICD-10-CM | POA: Diagnosis not present

## 2015-07-17 DIAGNOSIS — I2510 Atherosclerotic heart disease of native coronary artery without angina pectoris: Secondary | ICD-10-CM | POA: Insufficient documentation

## 2015-07-17 DIAGNOSIS — N183 Chronic kidney disease, stage 3 unspecified: Secondary | ICD-10-CM

## 2015-07-17 DIAGNOSIS — Z7982 Long term (current) use of aspirin: Secondary | ICD-10-CM | POA: Diagnosis not present

## 2015-07-17 DIAGNOSIS — R0600 Dyspnea, unspecified: Secondary | ICD-10-CM | POA: Insufficient documentation

## 2015-07-17 DIAGNOSIS — E1122 Type 2 diabetes mellitus with diabetic chronic kidney disease: Secondary | ICD-10-CM | POA: Diagnosis not present

## 2015-07-17 DIAGNOSIS — Z79899 Other long term (current) drug therapy: Secondary | ICD-10-CM | POA: Diagnosis not present

## 2015-07-17 DIAGNOSIS — I482 Chronic atrial fibrillation: Secondary | ICD-10-CM

## 2015-07-17 DIAGNOSIS — I4821 Permanent atrial fibrillation: Secondary | ICD-10-CM

## 2015-07-17 LAB — POCT INR: INR: 2.5

## 2015-07-17 MED ORDER — SPIRONOLACTONE 25 MG PO TABS: 12.5000 mg | ORAL_TABLET | Freq: Every day | ORAL | Status: DC

## 2015-07-17 MED ORDER — TORSEMIDE 20 MG PO TABS: ORAL_TABLET | ORAL | Status: DC

## 2015-07-17 NOTE — Progress Notes (Signed)
Patient ID: [name], male   DOB: 1941-10-22, 74 y.o.   MRN: [id number] PCP: Dr. Thayer Headings Primary Cardiologist: Dr. [name]  HPI: [name] is a 74 yo male with a history of HTN, DM2, HLD, CAD s/p CABG 1998, ischemic cardiomyopathy, chronic angina, chronic atrial fibrillation, LBBB, rheumatoid arthritis on chronic steroids, and severe spinal stenosis.  He has a chronically elevated left hemidiaphragm.  He has left hip pain with adductor muscle tears on prior MRI.   In 11/15 had prolonged hospitalization with cardiogenic shock requiring IABP and milrinone. Suspect occlusion of SVG-PDA prior to this hospitalization.Last admitted to [facility] in 12/15 with chest pain. Cardiac enzymes negative. Chest discomfort thought to be from upper respiratory infection and not heart failure. Discharge weight 199 pounds.    He had placement of St Jude CRT-D system in 5/16.  He was started on ranolazine which seems to have helped a lot with his angina.  His NTG use is down a lot, using about 4 NTG tabs/month.  He still has quite significant musculoskeletal disability.  He is walking only very short distances and has been like this for a long time. He has back pain and leg weakness/pain that are very limiting.  He has the same exertional dyspnea: short of breath after walking a short distance in his house.  Weight is up 1 lb.  At last pacemaker check, he was only BiV pacing about 26% of the time due to RVR.  Toprol XL was increased to 150 mg bid.  He never started on spironolactone.   Corevue was checked today and showed low impedance suggestive of fluid overload.   Labs (11/15): K 5.3, creatinine 1.98, BNP 15184 => 5069, hemoglobin 13.6  Labs (1/16): BNP 4338 Labs (01/16/2015): K 4.0 Creatinine 1.36   Labs (5/16): K 5 => 4, creatinine 1.59 => 1.58, BNP 574 Labs (6/16): K 4.5, creatinine 1.6  Studies: Echo (10/15/14): EF 25%, mod calcified annulus mitral valve, mod MR, RV sys fx mildly reduced LHC (10/16/14):  patent LIMA-LAD and SVG to OM, TO SVG-PDA with TO PDA and 60% proximal RCA, some left =>PDA collaterals;  RHC (10/16/14): RA 20, RV 69/11, PA 69/36 (35), PCWP 35, PA sat: 46% on 100% NRB, Fick CO/CI: 3.15/1.43, Thermo CO/CI: 3.61/1.63 ECG (11/15): atrial fibrillation with LBBB, QRS 132 msec.  ECHO (01/2015): EF 25-30%, mild MR.   ROS: All systems negative except as listed in HPI, PMH and Problem List.  SH:  History   Social History  . Marital Status: Married    Spouse Name: N/A  . Number of Children: N/A  . Years of Education: N/A   Occupational History  . Not on file.   Social History Main Topics  . Smoking status: Never Smoker   . Smokeless tobacco: Never Used  . Alcohol Use: No  . Drug Use: No  . Sexual Activity: Not Currently   Other Topics Concern  . Not on file   Social History Narrative    FH:  Family History  Problem Relation Age of Onset  . Heart disease Mother   . Heart disease Father   . Heart disease Brother   . Heart disease Maternal Aunt   . Cancer      Past Medical History  Diagnosis Date  . Pleuritis     h/o------ with negative vats biopsy  . Coronary artery disease     a. CABG 1998. b. NSTEMI 02/2013: Progression of dz in SVG-RPDA followed by severe stenosis in  grafted vessel, not likely safely reacted for PCI - med rx. c. NSTEMI 10/2014: patent LIMA-LAD and SVG to OM, TO SVG-PDA with TO PDA and 60% proximal RCA, some left =>PDA collaterals; culprit was likely occlusion of SVG-PDA though this may have been subacute given some collaterals.  . 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 Chronic anticoagulation, on coumadin 01/12/2013  . Cardiomyopathy, ischemic EF 25%   . Hypothyroidism   . GERD (gastroesophageal reflux disease)   . Chronic atrial fibrillation   . Digitalis toxicity   . Chronic systolic CHF (congestive heart failure)     a. EF prev 25%, improved to 45-50% in 2014. b. 10/2014: EF back downt o 25%.   [name] Hyperlipidemia   . LBBB (left bundle branch block)   . CKD (chronic kidney disease), stage III   . Long term current use of systemic steroids     a. For RA.  [name] Spinal stenosis   . Hip pain     a. adductor muscle tears on prior MRI.  [name] Chronic chest pain     a. Intolerant of Ranexa.  . Hyperkalemia     a. Spironolactone d/c'd due to this.  . Elevated hemidiaphragm   . Moderate mitral regurgitation 10/2014 echo  . MI (myocardial infarction)     "10/14/2014 I was told I'd had 2 MI's & didn't even know it" (11/27/2014)  . Type II diabetes mellitus   . Rheumatoid arthritis   . 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 aspirin EC 81 MG tablet Take 81 mg by mouth daily.    . calcium carbonate (OS-CAL) 600 MG TABS Take 600 mg by mouth at bedtime.     . fluticasone (FLONASE) 50 MCG/ACT nasal spray Place 2 sprays into both nostrils daily.     . hydrALAZINE (APRESOLINE) 25 MG tablet Take 1.5 tablets (37.5 mg total) by mouth 3 (three) times daily. 135 tablet 3  . isosorbide mononitrate (IMDUR) 60 MG 24 hr tablet Take 1.5 tablets (90 mg total) by mouth daily. 45 tablet 3  . levothyroxine (SYNTHROID, LEVOTHROID) 175 MCG tablet Take 175 mcg by mouth daily before breakfast.    . metoprolol succinate (TOPROL-XL) 100 MG 24 hr tablet TAKE A TABLET AND HALF  TWICE A DAY 60 tablet 3  . NITROSTAT 0.4 MG SL tablet PLACE 1 TABLET (0.4 MG TOTAL) UNDER THE TONGUE EVERY 5 (FIVE) MINUTES AS NEEDED FOR CHEST PAIN. 100 tablet 1  . nystatin cream (MYCOSTATIN) Apply 1 application topically daily as needed for dry skin.   4  . Omega-3 Fatty Acids (FISH OIL) 1000 MG CAPS Take 3,000 mg by mouth every morning.    [name] omeprazole (PRILOSEC) 20 MG capsule Take 20 mg by mouth daily.    [name] oxyCODONE-acetaminophen (PERCOCET/ROXICET) 5-325 MG per tablet Take 1-2 tablets by mouth every 4 (four) hours as needed  for severe pain.    . polyethylene glycol (MIRALAX / GLYCOLAX) packet Take 17 g by mouth daily as needed. (Patient taking differently: Take 17 g by mouth daily as needed (CONSTIPATION). ) 14 each 0  . predniSONE (DELTASONE) 5 MG tablet Take 1 tablet by mouth 2 (two) times daily.  1  . ranolazine (RANEXA) 500 MG 12 hr tablet Take 1 tablet (500 mg total) by mouth 2 (two) times daily. 56 tablet 0  .  rosuvastatin (CRESTOR) 20 MG tablet Take 1 tablet (20 mg total) by mouth daily. 30 tablet 0  . saxagliptin HCl (ONGLYZA) 5 MG TABS tablet Take 5 mg by mouth daily.    [name] senna (SENOKOT) 8.6 MG TABS tablet Take 1 tablet (8.6 mg total) by mouth daily. 120 each 0  . torsemide (DEMADEX) 20 MG tablet Take 2 tabs in AM and 1 tab in PM 180 tablet 1  . TOUJEO SOLOSTAR 300 UNIT/ML SOPN Inject 32 Units into the skin every morning.   4  . triamcinolone cream (KENALOG) 0.1 % Apply 1 application topically daily as needed (for rash).     . warfarin (COUMADIN) 5 MG tablet Take 1-2 tablets by mouth daily as directed (Patient taking differently: Take 7.5-10 mg by mouth daily at 6 PM. 5mg  on Friday and 7.5mg  all other days) 135 tablet 2  . spironolactone (ALDACTONE) 25 MG tablet Take 0.5 tablets (12.5 mg total) by mouth daily. 15 tablet 3   No current facility-administered medications for this encounter.    Filed Vitals:   07/17/15 0937  BP: 94/68  Pulse: 73  Weight: 215 lb 12.8 oz (97.886 kg)  SpO2: 98%    PHYSICAL EXAM: General:No resp difficulty, in wheelchair, wife and son present HEENT: normal Neck: supple. JVP 8. Carotids 2+ bilaterally; no bruits. No lymphadenopathy or thryomegaly appreciated. Cor: PMI normal. Heart irregular S1/S2, no S3/S4, 1/6 HSM at apex.  Lungs: Clear   Abdomen: soft, nontender, nondistended. No hepatosplenomegaly. No bruits or masses. Good bowel sounds. Extremities: no cyanosis, clubbing, rash.  Trace ankle edema bilaterally.  Neuro: alert & orientedx3, cranial nerves grossly  intact. Moves all 4 extremities w/o difficulty. Affect pleasant.  ASSESSMENT & PLAN: 1) Chronic systolic heart failure: EF 25% (01/2015), ischemic cardiomyopathy s/p CABG.  Patient was admitted to the hospital in 11/15 for acute/chronic systolic CHF with cardiogenic shock in the setting of NSTEMI. He had IABP placed and was diuresed with IV lasix and milrinone. He was weaned off milrinone.  He is very limited by orthopedic problems and does very little walking. NYHA class III symptoms, not just due to CHF (also orthopedic-related).  St Jude CRT-D system placed in 5/16 but he was only BiV pacing about 26% of the time at last pacemaker check due to RVR.  Toprol XL was increased.  Corevue suggests volume overload.  - Increase torsemide to 40 qam, 20 qpm.  BMET/BNP in 10 days.   - Continue current Toprol XL 150 mg bid.  - Continue current hydralazine/Imdur - Not on ACEI due to CKD. - I am going to try him on a low dose of spironolactone, 12.5 mg daily.  BMET in 10 days as above.  - No digoxin due to history of digoxin toxicity.  - He will see Dr Ladona Ridgel in a few weeks.  If BiV pacing percentage remains very low, will need to consider AV nodal ablation and pacing.  Will await Dr Lubertha Basque evaluation.  - Reinforced the need and importance of daily weights, a low sodium diet, and fluid restriction (less than 2 L a day). Instructed to call the HF clinic if weight increases more than 3 lbs overnight or 5 lbs in a week.  2) CAD s/p CABG: Culprit for NSTEMI in 11/15 was likely occlusion of SVG-PDA.  There was no good target for intervention. Since 11/15, he had been having exertional chest pain with relatively minimal exertion (25-30 feet).  At a prior appointment, I started him on ranolazine 500  mg bid.  This seems to have helped a lot. He is still using NTG but much less. - Continue ASA, Imdur, Toprol XL, and statin.  - Continue ranolazine 500 mg bid. Can increase to 1000 mg bid in the future if needed. 3)  Dyspnea:  Probably due to combination of deconditioning, systolic CHF, and elevated left hemidiaphragm.  He does not have significant COPD, and he did not have interstitial lung disease on last chest CT.     4) Atrial fibrillation: Chronic. HR controlled, will continue Toprol and warfarin.   5) Immobility: Continue to try and be as active as possible.  Follow up in 1 month.  Marca Ancona 07/17/2015

## 2015-07-17 NOTE — Telephone Encounter (Signed)
Received form from [facility], pt is sch for a procedure on his lumbar spine on 8/10 and they need clearance for pt to be off coumadin and aspirin for 5 days prior, per Dr [name] pt has no h/o cva and is ok to hold meds for 5 days, note faxed back to them at [phone]

## 2015-07-17 NOTE — Patient Instructions (Signed)
Start Spironolactone 12.5 mg (1/2 tab) daily  Increase Torsemide to 40 mg (2 tabs) in AM and 20 mg (1 tab) in PM  Labs in about 10 days  Your physician recommends that you schedule a follow-up appointment in: 1 month

## 2015-07-24 ENCOUNTER — Ambulatory Visit (INDEPENDENT_AMBULATORY_CARE_PROVIDER_SITE_OTHER): Payer: Medicare Other | Admitting: Pharmacist Clinician (PhC)/ Clinical Pharmacy Specialist

## 2015-07-24 DIAGNOSIS — Z7901 Long term (current) use of anticoagulants: Secondary | ICD-10-CM

## 2015-07-24 DIAGNOSIS — I4821 Permanent atrial fibrillation: Secondary | ICD-10-CM

## 2015-07-24 DIAGNOSIS — I482 Chronic atrial fibrillation: Secondary | ICD-10-CM

## 2015-07-24 LAB — POCT INR: INR: 1.1

## 2015-07-30 ENCOUNTER — Encounter: Payer: Self-pay | Admitting: Cardiology

## 2015-08-06 ENCOUNTER — Ambulatory Visit (INDEPENDENT_AMBULATORY_CARE_PROVIDER_SITE_OTHER): Payer: Medicare Other | Admitting: *Deleted

## 2015-08-06 ENCOUNTER — Ambulatory Visit (INDEPENDENT_AMBULATORY_CARE_PROVIDER_SITE_OTHER): Payer: Medicare Other | Admitting: Internal Medicine

## 2015-08-06 ENCOUNTER — Encounter: Payer: Self-pay | Admitting: Internal Medicine

## 2015-08-06 VITALS — BP 98/80 | HR 98 | Ht 73.0 in | Wt 209.0 lb

## 2015-08-06 DIAGNOSIS — I482 Chronic atrial fibrillation: Secondary | ICD-10-CM | POA: Diagnosis not present

## 2015-08-06 DIAGNOSIS — I4891 Unspecified atrial fibrillation: Secondary | ICD-10-CM

## 2015-08-06 DIAGNOSIS — I4821 Permanent atrial fibrillation: Secondary | ICD-10-CM

## 2015-08-06 DIAGNOSIS — I5022 Chronic systolic (congestive) heart failure: Secondary | ICD-10-CM | POA: Diagnosis not present

## 2015-08-06 DIAGNOSIS — Z7901 Long term (current) use of anticoagulants: Secondary | ICD-10-CM | POA: Diagnosis not present

## 2015-08-06 DIAGNOSIS — Z95810 Presence of automatic (implantable) cardiac defibrillator: Secondary | ICD-10-CM

## 2015-08-06 DIAGNOSIS — I255 Ischemic cardiomyopathy: Secondary | ICD-10-CM

## 2015-08-06 LAB — CUP PACEART INCLINIC DEVICE CHECK
Brady Statistic RV Percent Paced: 38 %
Date Time Interrogation Session: 20160823135010
HighPow Impedance: 89 Ohm
Lead Channel Impedance Value: 525 Ohm
Lead Channel Impedance Value: 562.5 Ohm
Lead Channel Pacing Threshold Amplitude: 0.75 V
Lead Channel Pacing Threshold Amplitude: 0.75 V
Lead Channel Pacing Threshold Amplitude: 1 V
Lead Channel Pacing Threshold Pulse Width: 0.4 ms
Lead Channel Pacing Threshold Pulse Width: 0.7 ms
Lead Channel Sensing Intrinsic Amplitude: 12 mV
Lead Channel Sensing Intrinsic Amplitude: 3 mV
Lead Channel Setting Pacing Amplitude: 2 V
Lead Channel Setting Pacing Pulse Width: 0.4 ms
Lead Channel Setting Pacing Pulse Width: 0.7 ms
Lead Channel Setting Sensing Sensitivity: 0.5 mV
MDC IDC MSMT BATTERY REMAINING LONGEVITY: 74.4 mo
MDC IDC MSMT LEADCHNL LV PACING THRESHOLD AMPLITUDE: 1 V
MDC IDC MSMT LEADCHNL LV PACING THRESHOLD PULSEWIDTH: 0.7 ms
MDC IDC MSMT LEADCHNL RV IMPEDANCE VALUE: 550 Ohm
MDC IDC MSMT LEADCHNL RV PACING THRESHOLD PULSEWIDTH: 0.4 ms
MDC IDC SET LEADCHNL RV PACING AMPLITUDE: 2.5 V
MDC IDC SET ZONE DETECTION INTERVAL: 250 ms
MDC IDC STAT BRADY RA PERCENT PACED: 0 %
Pulse Gen Serial Number: 7272291
Zone Setting Detection Interval: 300 ms
Zone Setting Detection Interval: 350 ms

## 2015-08-06 LAB — POCT INR: INR: 1.8

## 2015-08-06 NOTE — Patient Instructions (Addendum)
Medication Instructions:  Your physician recommends that you continue on your current medications as directed. Please refer to the Current Medication list given to you today.    Labwork: None ordered  Testing/Procedures: None ordered  Follow-Up: Your physician wants you to follow-up in: 9 MONTHS WITH DR. TAYLOR.  You will receive a reminder letter in the mail two months in advance. If you don't receive a letter, please call our office to schedule the follow-up appointment.   Remote monitoring is used to monitor your ICD from home. This monitoring reduces the number of office visits required to check your device to one time per year. It allows us to keep an eye on the functioning of your device to ensure it is working properly. You are scheduled for a device check from home on 11-05-2015. You may send your transmission at any time that day. If you have a wireless device, the transmission will be sent automatically. After your physician reviews your transmission, you will receive a postcard with your next transmission date.     Any Other Special Instructions Will Be Listed Below (If Applicable).     

## 2015-08-06 NOTE — Progress Notes (Signed)
HPI [name] returns today for followup, s/p  BiV ICD implant. He is a pleasant 74 yo man with multiple medical problems who has severe LV dysfunction and class 3 CHF. His EF is 25% and has been so for years. He also has chronic atrial fib with rate controlled reasonably well. He has a h/o lung problems and underwent surgery in the past complicated by a persistently elevated left hemidiaphragm. He is on maximal medical therapy. He has never had syncope. No h/o VT.  Allergies  Allergen Reactions  . Butrans [Buprenorphine] Shortness Of Breath  . Influenza Vaccines Other (See Comments)    Severe coughing for weeks after vaccine; also had flu like symptoms for about 7 to 8 weeks.   . Lanoxin [Digoxin] Other (See Comments)    Nausea, anorexia, caused tremendous weight loss  . Ranexa [Ranolazine Er] Other (See Comments)    TOLD NEVER TO TAKE MED-PER MD MD put him back on this medication and is tolerating well  . Lisinopril Other (See Comments)    cough  . Clarithromycin Other (See Comments)    Unknown, thinks "blisters in mouth"  . Statins Other (See Comments)    unknown     Current Outpatient Prescriptions  Medication Sig Dispense Refill  . aspirin EC 81 MG tablet Take 81 mg by mouth daily.    . calcium carbonate (OS-CAL) 600 MG TABS Take 600 mg by mouth at bedtime.     . fluticasone (FLONASE) 50 MCG/ACT nasal spray Place 2 sprays into both nostrils daily.     . hydrALAZINE (APRESOLINE) 25 MG tablet Take 1.5 tablets (37.5 mg total) by mouth 3 (three) times daily. 135 tablet 3  . isosorbide mononitrate (IMDUR) 60 MG 24 hr tablet Take 1.5 tablets (90 mg total) by mouth daily. 45 tablet 3  . levothyroxine (SYNTHROID, LEVOTHROID) 175 MCG tablet Take 175 mcg by mouth daily before breakfast.    . metoprolol succinate (TOPROL-XL) 100 MG 24 hr tablet TAKE ONE TABLET AND HALF BY MOUTH TWICE A DAY Take with or immediately following a meal.    . NITROSTAT 0.4 MG SL tablet PLACE 1 TABLET  (0.4 MG TOTAL) UNDER THE TONGUE EVERY 5 (FIVE) MINUTES AS NEEDED FOR CHEST PAIN. 100 tablet 1  . nystatin cream (MYCOSTATIN) Apply 1 application topically daily as needed for dry skin.   4  . Omega-3 Fatty Acids (FISH OIL) 1000 MG CAPS Take 3,000 mg by mouth every morning.    [name] omeprazole (PRILOSEC) 20 MG capsule Take 20 mg by mouth daily.    [name] oxyCODONE-acetaminophen (PERCOCET/ROXICET) 5-325 MG per tablet Take 1-2 tablets by mouth every 4 (four) hours as needed for severe pain.    . polyethylene glycol (MIRALAX / GLYCOLAX) packet Take 17 g by mouth daily as needed. (Patient taking differently: Take 17 g by mouth daily as needed (CONSTIPATION). ) 14 each 0  . predniSONE (DELTASONE) 5 MG tablet Take 1 tablet by mouth 2 (two) times daily.  1  . ranolazine (RANEXA) 500 MG 12 hr tablet Take 1 tablet (500 mg total) by mouth 2 (two) times daily. 56 tablet 0  . rosuvastatin (CRESTOR) 20 MG tablet Take 1 tablet (20 mg total) by mouth daily. 30 tablet 0  . saxagliptin HCl (ONGLYZA) 5 MG TABS tablet Take 5 mg by mouth daily.    [name] senna (SENOKOT) 8.6 MG TABS tablet Take 1 tablet (8.6 mg total) by mouth daily. 120 each 0  . spironolactone (  ALDACTONE) 25 MG tablet Take 0.5 tablets (12.5 mg total) by mouth daily. 15 tablet 3  . torsemide (DEMADEX) 20 MG tablet Take 2 tabs by mouth in AM and 1 tab in PM    . TOUJEO SOLOSTAR 300 UNIT/ML SOPN Inject 32 Units into the skin every morning.   4  . triamcinolone cream (KENALOG) 0.1 % Apply 1 application topically daily as needed (for rash).     . warfarin (COUMADIN) 5 MG tablet Take 1-2 tablets by mouth daily as directed (Patient taking differently: Take 7.5-10 mg by mouth daily at 6 PM. 5mg  on Friday and 7.5mg  all other days) 135 tablet 2   No current facility-administered medications for this visit.     Past Medical History  Diagnosis Date  . Pleuritis     h/o------ with negative vats biopsy  . Coronary artery disease     a. CABG 1998. b. NSTEMI 02/2013:  Progression of dz in SVG-RPDA followed by severe stenosis in grafted vessel, not likely safely reacted for PCI - med rx. c. NSTEMI 10/2014: patent LIMA-LAD and SVG to OM, TO SVG-PDA with TO PDA and 60% proximal RCA, some left =>PDA collaterals; culprit was likely occlusion of SVG-PDA though this may have been subacute given some collaterals.  . 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 Chronic anticoagulation, on coumadin 01/12/2013  . Cardiomyopathy, ischemic EF 25%   . Hypothyroidism   . GERD (gastroesophageal reflux disease)   . Chronic atrial fibrillation   . Digitalis toxicity   . Chronic systolic CHF (congestive heart failure)     a. EF prev 25%, improved to 45-50% in 2014. b. 10/2014: EF back downt o 25%.  [name] Hyperlipidemia   . LBBB (left bundle branch block)   . CKD (chronic kidney disease), stage III   . Long term current use of systemic steroids     a. For RA.  [name] Spinal stenosis   . Hip pain     a. adductor muscle tears on prior MRI.  [name] Chronic chest pain     a. Intolerant of Ranexa.  . Hyperkalemia     a. Spironolactone d/c'd due to this.  . Elevated hemidiaphragm   . Moderate mitral regurgitation 10/2014 echo  . MI (myocardial infarction)     "10/14/2014 I was told I'd had 2 MI's & didn't even know it" (11/27/2014)  . Type II diabetes mellitus   . Rheumatoid arthritis   . 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   Past Surgical History  Procedure Laterality Date  . Lung surgery  1994    "infection in lungs; scraped it out on right; 6 wks later they did left; clipped left diaphragm & paralyzed it"  . Total knee arthroplasty Bilateral 2000's  . Carpal tunnel release Bilateral 1990's  . Cataract extraction w/ intraocular lens  implant, bilateral Bilateral 1990's  . Cardiac catheterization Left 01/11/2013     Severe CAD with notable progression of disease in the SVG-RPDA followed by severe stenosis in grafted vessel, region not safely "reachede" for PCI, moderate ischemic CM no notable chage in EF, continue medical therapy  . Cardiac catheterization  04/03/2011    Left circumflex 95% stenosis, essentially unchanged from last cath  . Cardiac catheterization  04/01/2010    LAD 99% proximal stenosis with vague flow,  circumflex 80% stenosis, essentially unchanged form last cath  . Cardiac catheterization  03/28/2010    Mild pulmonary hypertension, medical therapy  . Cardiac catheterization  04/30/2009    LAD 95% stenosis, essentially unchanged fomr last cath, medical therapy  . Cardiac catheterization  12/05/2008    2.5x12 apex, predilation in the distal PLA branch, 3x23 XIENCE V durg eluting stent and post dilated with a 3.25x20 Roca VOYAGER at 15 atmospheres  . Left heart catheterization with coronary/graft angiogram N/A 01/13/2013    Procedure: LEFT HEART CATHETERIZATION WITH Isabel Caprice;  Surgeon: Marykay Lex, MD;  Location: [facility] CATH LAB;  Service: Cardiovascular;  Laterality: N/A;  . Left and right heart catheterization with coronary/graft angiogram N/A 10/16/2014    Procedure: LEFT AND RIGHT HEART CATHETERIZATION WITH Isabel Caprice;  Surgeon: Marykay Lex, MD;  Location: [facility] CATH LAB;  Service: Cardiovascular;  Laterality: N/A;  . Coronary artery bypass graft  05/1997    "CABG X7"  . Patella fracture surgery Right "before replacement"  . Knee arthroscopy Left "before replacement  . Ep implantable device N/A 05/06/2015    Procedure: BiV ICD Insertion CRT-D;  Surgeon: Marinus Maw, MD;  Location: [facility] INVASIVE CV LAB;  Service: Cardiovascular;  Laterality: N/A;     Family History  Problem Relation Age of Onset  . Heart disease Mother   . Heart disease Father   . Heart disease Brother   . Heart disease Maternal Aunt   . Cancer       Social History   Social History  .  Marital Status: Married    Spouse Name: N/A  . Number of Children: N/A  . Years of Education: N/A   Occupational History  . Not on file.   Social History Main Topics  . Smoking status: Never Smoker   . Smokeless tobacco: Never Used  . Alcohol Use: No  . Drug Use: No  . Sexual Activity: Not Currently   Other Topics Concern  . Not on file   Social History Narrative     BP 98/80 mmHg  Pulse 98  Ht 6\' 1"  (1.854 m)  Wt 209 lb (94.802 kg)  BMI 27.58 kg/m2  Physical Exam:  Well appearing 74 yo man, NAD HEENT: Unremarkable Neck:  7 cm JVD, no thyromegally Lymphatics:  No adenopathy Back:  No CVA tenderness Lungs:  Clear HEART:  IRegular rate rhythm, no murmurs, no rubs, no clicks Abd:  soft, positive bowel sounds, no organomegally, no rebound, no guarding Ext:  2 plus pulses, no edema, no cyanosis, no clubbing Skin:  No rashes no nodules Neuro:  CN II through XII intact, motor grossly intact  EKG - atrial fib with LBBB/IVCD and QRS duration 130 ms  DEVICE  Normal device function.  See PaceArt for details.   Assess/Plan:

## 2015-08-08 ENCOUNTER — Encounter: Payer: Self-pay | Admitting: Cardiology

## 2015-08-08 NOTE — Assessment & Plan Note (Signed)
His symptoms are class 2B. He will continue his current meds and will maintain a low sodium diet.

## 2015-08-08 NOTE — Assessment & Plan Note (Signed)
He has minimal anginal symptoms as he has been very sedentary due to spinal stenosis.

## 2015-08-08 NOTE — Assessment & Plan Note (Addendum)
His ventricular rate is not well controlled. I have discussed AV node ablation but have recommended against this for now as he is not able to walk. His percent BiV pacing is reduced.

## 2015-08-08 NOTE — Assessment & Plan Note (Signed)
His BiV ICD has been interrogated and is working normally.

## 2015-08-20 ENCOUNTER — Ambulatory Visit (HOSPITAL_COMMUNITY)
Admission: RE | Admit: 2015-08-20 | Discharge: 2015-08-20 | Disposition: A | Discharge status: Home / Self Care | Payer: Medicare Other | Source: Ambulatory Visit | Attending: Cardiology | Admitting: Cardiology

## 2015-08-20 ENCOUNTER — Ambulatory Visit (INDEPENDENT_AMBULATORY_CARE_PROVIDER_SITE_OTHER): Payer: Medicare Other | Admitting: Pharmacist Clinician (PhC)/ Clinical Pharmacy Specialist

## 2015-08-20 ENCOUNTER — Encounter (HOSPITAL_COMMUNITY): Payer: Self-pay

## 2015-08-20 VITALS — BP 88/58 | HR 75 | Wt 214.8 lb

## 2015-08-20 DIAGNOSIS — Z7901 Long term (current) use of anticoagulants: Secondary | ICD-10-CM | POA: Insufficient documentation

## 2015-08-20 DIAGNOSIS — Z79899 Other long term (current) drug therapy: Secondary | ICD-10-CM | POA: Diagnosis not present

## 2015-08-20 DIAGNOSIS — Z95810 Presence of automatic (implantable) cardiac defibrillator: Secondary | ICD-10-CM | POA: Diagnosis not present

## 2015-08-20 DIAGNOSIS — I482 Chronic atrial fibrillation: Secondary | ICD-10-CM | POA: Insufficient documentation

## 2015-08-20 DIAGNOSIS — Z7952 Long term (current) use of systemic steroids: Secondary | ICD-10-CM | POA: Diagnosis not present

## 2015-08-20 DIAGNOSIS — E1122 Type 2 diabetes mellitus with diabetic chronic kidney disease: Secondary | ICD-10-CM | POA: Diagnosis not present

## 2015-08-20 DIAGNOSIS — K219 Gastro-esophageal reflux disease without esophagitis: Secondary | ICD-10-CM | POA: Diagnosis not present

## 2015-08-20 DIAGNOSIS — I252 Old myocardial infarction: Secondary | ICD-10-CM | POA: Diagnosis not present

## 2015-08-20 DIAGNOSIS — I447 Left bundle-branch block, unspecified: Secondary | ICD-10-CM | POA: Diagnosis not present

## 2015-08-20 DIAGNOSIS — Z951 Presence of aortocoronary bypass graft: Secondary | ICD-10-CM | POA: Insufficient documentation

## 2015-08-20 DIAGNOSIS — Z8249 Family history of ischemic heart disease and other diseases of the circulatory system: Secondary | ICD-10-CM | POA: Insufficient documentation

## 2015-08-20 DIAGNOSIS — I2510 Atherosclerotic heart disease of native coronary artery without angina pectoris: Secondary | ICD-10-CM | POA: Diagnosis not present

## 2015-08-20 DIAGNOSIS — E039 Hypothyroidism, unspecified: Secondary | ICD-10-CM | POA: Insufficient documentation

## 2015-08-20 DIAGNOSIS — I255 Ischemic cardiomyopathy: Secondary | ICD-10-CM | POA: Diagnosis not present

## 2015-08-20 DIAGNOSIS — N183 Chronic kidney disease, stage 3 unspecified: Secondary | ICD-10-CM

## 2015-08-20 DIAGNOSIS — E785 Hyperlipidemia, unspecified: Secondary | ICD-10-CM | POA: Diagnosis not present

## 2015-08-20 DIAGNOSIS — Z7982 Long term (current) use of aspirin: Secondary | ICD-10-CM | POA: Diagnosis not present

## 2015-08-20 DIAGNOSIS — I129 Hypertensive chronic kidney disease with stage 1 through stage 4 chronic kidney disease, or unspecified chronic kidney disease: Secondary | ICD-10-CM | POA: Diagnosis not present

## 2015-08-20 DIAGNOSIS — I5022 Chronic systolic (congestive) heart failure: Secondary | ICD-10-CM | POA: Diagnosis not present

## 2015-08-20 DIAGNOSIS — R0600 Dyspnea, unspecified: Secondary | ICD-10-CM | POA: Diagnosis not present

## 2015-08-20 DIAGNOSIS — I25708 Atherosclerosis of coronary artery bypass graft(s), unspecified, with other forms of angina pectoris: Secondary | ICD-10-CM | POA: Diagnosis not present

## 2015-08-20 DIAGNOSIS — I4821 Permanent atrial fibrillation: Secondary | ICD-10-CM

## 2015-08-20 DIAGNOSIS — M069 Rheumatoid arthritis, unspecified: Secondary | ICD-10-CM | POA: Insufficient documentation

## 2015-08-20 LAB — POCT INR: INR: 2.6

## 2015-08-20 MED ORDER — TORSEMIDE 20 MG PO TABS: ORAL_TABLET | ORAL | Status: DC

## 2015-08-20 MED ORDER — HYDRALAZINE HCL 25 MG PO TABS: 25.0000 mg | ORAL_TABLET | Freq: Three times a day (TID) | ORAL | Status: DC

## 2015-08-20 NOTE — Progress Notes (Signed)
Patient ID: [name], male   DOB: Feb 28, 1941, 74 y.o.   MRN: [id number] PCP: Dr. Thayer Headings Primary Cardiologist: Dr. [name] HF Cardiologist: Dr. [name]  HPI: [name] is a 74 yo male with a history of HTN, DM2, HLD, CAD s/p CABG 1998, ischemic cardiomyopathy, chronic angina, chronic atrial fibrillation, LBBB, rheumatoid arthritis on chronic steroids, and severe spinal stenosis.  He has a chronically elevated left hemidiaphragm.  He has left hip pain with adductor muscle tears on prior MRI.   In 11/15 had prolonged hospitalization with cardiogenic shock requiring IABP and milrinone. Suspect occlusion of SVG-PDA prior to this hospitalization.Last admitted to [facility] in 12/15 with chest pain. Cardiac enzymes negative. Chest discomfort thought to be from upper respiratory infection and not heart failure. Discharge weight 199 pounds.    He had placement of St Jude CRT-D system in 5/16.  He was started on ranolazine which seems to have helped a lot with his angina.  His NTG use is down a lot, none used for 3-4 weeks now.  He still has quite significant musculoskeletal disability.  He is walking only very short distances and has been like this for a long time. He has back pain and leg weakness/pain from spinal stenosis that are very limiting.  Weight is up about 5 lbs and he is short of breath with any walking.  He has been sleeping in a recliner for 3 years.  At last appointment, he was to increase torsemide to 40 qam/20 qpm, but he only did this for about 2-3 days and then went back to 40 mg daily.  He is only BiV pacing 40-50% of the time.  Occasional dizziness, no falls.   Corevue was checked today and showed low impedance suggestive of fluid overload.   Labs (11/15): K 5.3, creatinine 1.98, BNP 15184 => 5069, hemoglobin 13.6  Labs (1/16): BNP 4338 Labs (01/16/2015): K 4.0 Creatinine 1.36   Labs (5/16): K 5 => 4, creatinine 1.59 => 1.58, BNP 574 Labs (6/16): K 4.5, creatinine 1.6 Labs (8/16):  BNP 356, K 4.3, creatinine 1.5  Studies: Echo (10/15/14): EF 25%, mod calcified annulus mitral valve, mod MR, RV sys fx mildly reduced LHC (10/16/14): patent LIMA-LAD and SVG to OM, TO SVG-PDA with TO PDA and 60% proximal RCA, some left =>PDA collaterals;  RHC (10/16/14): RA 20, RV 69/11, PA 69/36 (35), PCWP 35, PA sat: 46% on 100% NRB, Fick CO/CI: 3.15/1.43, Thermo CO/CI: 3.61/1.63 ECG (11/15): atrial fibrillation with LBBB, QRS 132 msec.  ECHO (01/2015): EF 25-30%, mild MR.   ROS: All systems negative except as listed in HPI, PMH and Problem List.  SH:  Social History   Social History  . Marital Status: Married    Spouse Name: N/A  . Number of Children: N/A  . Years of Education: N/A   Occupational History  . Not on file.   Social History Main Topics  . Smoking status: Never Smoker   . Smokeless tobacco: Never Used  . Alcohol Use: No  . Drug Use: No  . Sexual Activity: Not Currently   Other Topics Concern  . Not on file   Social History Narrative    FH:  Family History  Problem Relation Age of Onset  . Heart disease Mother   . Heart disease Father   . Heart disease Brother   . Heart disease Maternal Aunt   . Cancer      Past Medical History  Diagnosis Date  . Pleuritis  h/o------ with negative vats biopsy  . Coronary artery disease     a. CABG 1998. b. NSTEMI 02/2013: Progression of dz in SVG-RPDA followed by severe stenosis in grafted vessel, not likely safely reacted for PCI - med rx. c. NSTEMI 10/2014: patent LIMA-LAD and SVG to OM, TO SVG-PDA with TO PDA and 60% proximal RCA, some left =>PDA collaterals; culprit was likely occlusion of SVG-PDA though this may have been subacute given some collaterals.  . 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 Chronic anticoagulation, on coumadin 01/12/2013  . Cardiomyopathy, ischemic EF 25%   . Hypothyroidism   . GERD (gastroesophageal reflux disease)   . Chronic atrial fibrillation    . Digitalis toxicity   . Chronic systolic CHF (congestive heart failure)     a. EF prev 25%, improved to 45-50% in 2014. b. 10/2014: EF back downt o 25%.  [name] Hyperlipidemia   . LBBB (left bundle branch block)   . CKD (chronic kidney disease), stage III   . Long term current use of systemic steroids     a. For RA.  [name] Spinal stenosis   . Hip pain     a. adductor muscle tears on prior MRI.  [name] Chronic chest pain     a. Intolerant of Ranexa.  . Hyperkalemia     a. Spironolactone d/c'd due to this.  . Elevated hemidiaphragm   . Moderate mitral regurgitation 10/2014 echo  . MI (myocardial infarction)     "10/14/2014 I was told I'd had 2 MI's & didn't even know it" (11/27/2014)  . Type II diabetes mellitus   . Rheumatoid arthritis   . 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 aspirin EC 81 MG tablet Take 81 mg by mouth daily.    . calcium carbonate (OS-CAL) 600 MG TABS Take 600 mg by mouth at bedtime.     . fluticasone (FLONASE) 50 MCG/ACT nasal spray Place 2 sprays into both nostrils daily.     . hydrALAZINE (APRESOLINE) 25 MG tablet Take 1 tablet (25 mg total) by mouth 3 (three) times daily. 135 tablet 3  . levothyroxine (SYNTHROID, LEVOTHROID) 175 MCG tablet Take 175 mcg by mouth daily before breakfast.    . metoprolol succinate (TOPROL-XL) 100 MG 24 hr tablet TAKE ONE TABLET AND HALF BY MOUTH TWICE A DAY Take with or immediately following a meal.    . NITROSTAT 0.4 MG SL tablet PLACE 1 TABLET (0.4 MG TOTAL) UNDER THE TONGUE EVERY 5 (FIVE) MINUTES AS NEEDED FOR CHEST PAIN. 100 tablet 1  . nystatin cream (MYCOSTATIN) Apply 1 application topically daily as needed for dry skin.   4  . Omega-3 Fatty Acids (FISH OIL) 1000 MG CAPS Take 3,000 mg by mouth every morning.    [name] omeprazole (PRILOSEC) 20 MG capsule Take 20 mg by mouth daily.    [name] oxyCODONE-acetaminophen  (PERCOCET/ROXICET) 5-325 MG per tablet Take 1-2 tablets by mouth every 4 (four) hours as needed for severe pain.    . polyethylene glycol (MIRALAX / GLYCOLAX) packet Take 17 g by mouth daily as needed. (Patient taking differently: Take 17 g by mouth daily as needed (CONSTIPATION). ) 14 each 0  . predniSONE (DELTASONE) 5 MG tablet Take 1 tablet by mouth 2 (two) times daily.  1  . ranolazine (RANEXA) 500 MG 12 hr tablet Take 1  tablet (500 mg total) by mouth 2 (two) times daily. 56 tablet 0  . rosuvastatin (CRESTOR) 20 MG tablet Take 1 tablet (20 mg total) by mouth daily. 30 tablet 0  . saxagliptin HCl (ONGLYZA) 5 MG TABS tablet Take 5 mg by mouth daily.    [name] senna (SENOKOT) 8.6 MG TABS tablet Take 1 tablet (8.6 mg total) by mouth daily. 120 each 0  . spironolactone (ALDACTONE) 25 MG tablet Take 0.5 tablets (12.5 mg total) by mouth daily. 15 tablet 3  . torsemide (DEMADEX) 20 MG tablet Take 40 mg (2 tabs) in AM and 20 mg (1 tab) in PM    . TOUJEO SOLOSTAR 300 UNIT/ML SOPN Inject 32 Units into the skin every morning.   4  . triamcinolone cream (KENALOG) 0.1 % Apply 1 application topically daily as needed (for rash).     . warfarin (COUMADIN) 5 MG tablet Take 1-2 tablets by mouth daily as directed (Patient taking differently: Take 7.5-10 mg by mouth daily at 6 PM. 5mg  on Friday and 7.5mg  all other days) 135 tablet 2  . isosorbide mononitrate (IMDUR) 60 MG 24 hr tablet Take 1.5 tablets (90 mg total) by mouth daily. 45 tablet 3   No current facility-administered medications for this encounter.    Filed Vitals:   08/20/15 1014  BP: 88/58  Pulse: 75  Weight: 214 lb 12.8 oz (97.433 kg)  SpO2: 96%    PHYSICAL EXAM: General:No resp difficulty, in wheelchair, wife and son present HEENT: normal Neck: supple. JVP 8. Carotids 2+ bilaterally; no bruits. No lymphadenopathy or thryomegaly appreciated. Cor: PMI normal. Heart irregular S1/S2, no S3/S4, 1/6 HSM at apex.  Lungs: Clear   Abdomen: soft,  nontender, nondistended. No hepatosplenomegaly. No bruits or masses. Good bowel sounds. Extremities: no cyanosis, clubbing, rash.  Trace ankle edema bilaterally.  Neuro: alert & orientedx3, cranial nerves grossly intact. Moves all 4 extremities w/o difficulty. Affect pleasant.  ASSESSMENT & PLAN: 1) Chronic systolic heart failure: EF 25% (echo 01/2015), ischemic cardiomyopathy s/p CABG.  Patient was admitted to the hospital in 11/15 for acute/chronic systolic CHF with cardiogenic shock in the setting of NSTEMI. He had IABP placed and was diuresed with IV lasix and milrinone. He was weaned off milrinone.  He is very limited by orthopedic problems and does very little walking. NYHA class III symptoms, not just due to CHF (also orthopedic-related).  St Jude CRT-D system placed in 5/16 but he is only BiV pacing 40-50% of the time despite increasing Toprol XL.  He saw Dr 6/16, and it was decided not to do AV nodal ablation because of his poor functional status from spinal stenosis.  Corevue and exam suggests volume overload.  - Increase torsemide to 40 mg bid today then to 40 qam, 20 qpm chronically.  BMET/BNP in 10 days.   - Continue current Toprol XL 150 mg bid.  - Decrease hydralazine to 25 mg tid with soft BP, continue Imdur.  - Not on ACEI due to CKD. - Continue spironolactone, BMET today.  - No digoxin due to history of digoxin toxicity.  - Reinforced the need and importance of daily weights, a low sodium diet, and fluid restriction (less than 2 L a day). Instructed to call the HF clinic if weight increases more than 3 lbs overnight or 5 lbs in a week.  2) CAD s/p CABG: Culprit for NSTEMI in 11/15 was likely occlusion of SVG-PDA.  There was no good target for intervention. Minimal CP and NTG  use now that he is on ranolazine.  - Continue ASA, Imdur, Toprol XL, and statin.  - Continue ranolazine 500 mg bid. Can increase to 1000 mg bid in the future if needed. 3) Dyspnea:  Probably due to combination  of deconditioning, systolic CHF, and elevated left hemidiaphragm.  He does not have significant COPD, and he did not have interstitial lung disease on last chest CT.     4) Atrial fibrillation: Chronic. HR controlled, will continue Toprol and warfarin.   5) Immobility: Continue to try and be as active as possible.  Follow up in 6 wks  Marca Ancona 08/20/2015

## 2015-08-20 NOTE — Patient Instructions (Addendum)
Decrease Hydralazine to 25 mg (1 tab) Three times a day   Torsemide Dose:  Take 40 mg (2 tabs) this afternoon  Take 40 mg (2 tabs) Twice daily TOMORROW 08/21/15  Then start 40 mg (2 tabs) in AM and 20 mg (1 tab) in PM starting on Thur 08/22/15  Labs in 10 days  Your physician recommends that you schedule a follow-up appointment in: 6 weeks with Dr [name]

## 2015-09-05 ENCOUNTER — Encounter: Payer: Self-pay | Admitting: Cardiology

## 2015-09-07 ENCOUNTER — Other Ambulatory Visit (HOSPITAL_COMMUNITY): Payer: Self-pay | Admitting: Cardiology

## 2015-09-09 ENCOUNTER — Telehealth (HOSPITAL_COMMUNITY): Payer: Self-pay | Admitting: *Deleted

## 2015-09-09 MED ORDER — TORSEMIDE 20 MG PO TABS: 40.0000 mg | ORAL_TABLET | Freq: Every day | ORAL | Status: DC

## 2015-09-09 NOTE — Telephone Encounter (Signed)
-----   Message from Laurey Morale, MD sent at 09/06/2015 10:09 AM EDT ----- Creatinine is higher than normal for him at 2.27.  Would stop his pm dose of torsemide and repeat BMET in 1 week.

## 2015-09-09 NOTE — Telephone Encounter (Signed)
Spoke with pt and his wife they are aware of lab results and medication changes. Pt on the lab schedule 9/28

## 2015-09-11 ENCOUNTER — Ambulatory Visit (INDEPENDENT_AMBULATORY_CARE_PROVIDER_SITE_OTHER): Payer: Medicare Other | Admitting: Pharmacist Clinician (PhC)/ Clinical Pharmacy Specialist

## 2015-09-11 ENCOUNTER — Ambulatory Visit (HOSPITAL_COMMUNITY)
Admission: RE | Admit: 2015-09-11 | Discharge: 2015-09-11 | Disposition: A | Discharge status: Home / Self Care | Payer: Medicare Other | Source: Ambulatory Visit | Attending: Cardiology | Admitting: Cardiology

## 2015-09-11 DIAGNOSIS — I5023 Acute on chronic systolic (congestive) heart failure: Secondary | ICD-10-CM | POA: Insufficient documentation

## 2015-09-11 DIAGNOSIS — I5022 Chronic systolic (congestive) heart failure: Secondary | ICD-10-CM | POA: Diagnosis not present

## 2015-09-11 DIAGNOSIS — I482 Chronic atrial fibrillation: Secondary | ICD-10-CM

## 2015-09-11 DIAGNOSIS — Z7901 Long term (current) use of anticoagulants: Secondary | ICD-10-CM | POA: Diagnosis not present

## 2015-09-11 DIAGNOSIS — I4821 Permanent atrial fibrillation: Secondary | ICD-10-CM

## 2015-09-11 LAB — BASIC METABOLIC PANEL
ANION GAP: 9 (ref 5–15)
BUN: 22 mg/dL — ABNORMAL HIGH (ref 6–20)
CALCIUM: 8.4 mg/dL — AB (ref 8.9–10.3)
CO2: 26 mmol/L (ref 22–32)
CREATININE: 2.02 mg/dL — AB (ref 0.61–1.24)
Chloride: 102 mmol/L (ref 101–111)
GFR, EST AFRICAN AMERICAN: 35 mL/min — AB (ref >60)
GFR, EST NON AFRICAN AMERICAN: 30 mL/min — AB (ref >60)
Glucose, Bld: 171 mg/dL — ABNORMAL HIGH (ref 65–99)
Potassium: 6 mmol/L — ABNORMAL HIGH (ref 3.5–5.1)
SODIUM: 137 mmol/L (ref 135–145)

## 2015-09-11 LAB — POCT INR: INR: 4.4

## 2015-09-16 ENCOUNTER — Other Ambulatory Visit (HOSPITAL_COMMUNITY): Payer: Self-pay | Admitting: *Deleted

## 2015-09-16 ENCOUNTER — Telehealth: Payer: Self-pay | Admitting: Cardiology

## 2015-09-16 ENCOUNTER — Ambulatory Visit (HOSPITAL_COMMUNITY)
Admission: RE | Admit: 2015-09-16 | Discharge: 2015-09-16 | Disposition: A | Discharge status: Home / Self Care | Payer: Medicare Other | Source: Ambulatory Visit | Attending: Cardiology | Admitting: Cardiology

## 2015-09-16 DIAGNOSIS — I5022 Chronic systolic (congestive) heart failure: Secondary | ICD-10-CM

## 2015-09-16 LAB — BASIC METABOLIC PANEL
Anion gap: 8 (ref 5–15)
BUN: 17 mg/dL (ref 6–20)
CALCIUM: 8.9 mg/dL (ref 8.9–10.3)
CO2: 25 mmol/L (ref 22–32)
Chloride: 104 mmol/L (ref 101–111)
Creatinine, Ser: 1.69 mg/dL — ABNORMAL HIGH (ref 0.61–1.24)
GFR calc Af Amer: 44 mL/min — ABNORMAL LOW (ref >60)
GFR, EST NON AFRICAN AMERICAN: 38 mL/min — AB (ref >60)
GLUCOSE: 215 mg/dL — AB (ref 65–99)
Potassium: 4.2 mmol/L (ref 3.5–5.1)
Sodium: 137 mmol/L (ref 135–145)

## 2015-09-16 NOTE — Telephone Encounter (Signed)
Called [facility] pharmacy to give an verbal order, pt requested 90 day supply. The verbal order was for Hydralazine 25 mg, pt to take 1 tablet 3 times daily, dispense 270 tablets with 2 refills. [facility] verbalizing understanding.

## 2015-09-17 ENCOUNTER — Encounter: Payer: Self-pay | Admitting: Internal Medicine

## 2015-09-19 ENCOUNTER — Encounter: Payer: Self-pay | Admitting: Internal Medicine

## 2015-09-19 ENCOUNTER — Encounter (HOSPITAL_COMMUNITY): Payer: Self-pay

## 2015-09-19 ENCOUNTER — Ambulatory Visit (HOSPITAL_COMMUNITY)
Admission: RE | Admit: 2015-09-19 | Discharge: 2015-09-19 | Disposition: A | Discharge status: Home / Self Care | Payer: Medicare Other | Source: Ambulatory Visit | Attending: Cardiology | Admitting: Cardiology

## 2015-09-19 VITALS — BP 114/72 | HR 84 | Wt 215.0 lb

## 2015-09-19 DIAGNOSIS — Z8249 Family history of ischemic heart disease and other diseases of the circulatory system: Secondary | ICD-10-CM | POA: Diagnosis not present

## 2015-09-19 DIAGNOSIS — E039 Hypothyroidism, unspecified: Secondary | ICD-10-CM | POA: Diagnosis not present

## 2015-09-19 DIAGNOSIS — Z95810 Presence of automatic (implantable) cardiac defibrillator: Secondary | ICD-10-CM | POA: Diagnosis not present

## 2015-09-19 DIAGNOSIS — M25552 Pain in left hip: Secondary | ICD-10-CM | POA: Insufficient documentation

## 2015-09-19 DIAGNOSIS — I482 Chronic atrial fibrillation: Secondary | ICD-10-CM | POA: Diagnosis not present

## 2015-09-19 DIAGNOSIS — Z7952 Long term (current) use of systemic steroids: Secondary | ICD-10-CM | POA: Diagnosis not present

## 2015-09-19 DIAGNOSIS — I252 Old myocardial infarction: Secondary | ICD-10-CM | POA: Insufficient documentation

## 2015-09-19 DIAGNOSIS — R079 Chest pain, unspecified: Secondary | ICD-10-CM | POA: Insufficient documentation

## 2015-09-19 DIAGNOSIS — M069 Rheumatoid arthritis, unspecified: Secondary | ICD-10-CM | POA: Insufficient documentation

## 2015-09-19 DIAGNOSIS — E1122 Type 2 diabetes mellitus with diabetic chronic kidney disease: Secondary | ICD-10-CM | POA: Diagnosis not present

## 2015-09-19 DIAGNOSIS — Z951 Presence of aortocoronary bypass graft: Secondary | ICD-10-CM | POA: Diagnosis not present

## 2015-09-19 DIAGNOSIS — I5022 Chronic systolic (congestive) heart failure: Secondary | ICD-10-CM | POA: Diagnosis not present

## 2015-09-19 DIAGNOSIS — R0600 Dyspnea, unspecified: Secondary | ICD-10-CM | POA: Diagnosis not present

## 2015-09-19 DIAGNOSIS — K219 Gastro-esophageal reflux disease without esophagitis: Secondary | ICD-10-CM | POA: Insufficient documentation

## 2015-09-19 DIAGNOSIS — I255 Ischemic cardiomyopathy: Secondary | ICD-10-CM | POA: Diagnosis not present

## 2015-09-19 DIAGNOSIS — Z79899 Other long term (current) drug therapy: Secondary | ICD-10-CM | POA: Insufficient documentation

## 2015-09-19 DIAGNOSIS — I129 Hypertensive chronic kidney disease with stage 1 through stage 4 chronic kidney disease, or unspecified chronic kidney disease: Secondary | ICD-10-CM | POA: Diagnosis not present

## 2015-09-19 DIAGNOSIS — Z7982 Long term (current) use of aspirin: Secondary | ICD-10-CM | POA: Insufficient documentation

## 2015-09-19 DIAGNOSIS — E785 Hyperlipidemia, unspecified: Secondary | ICD-10-CM | POA: Insufficient documentation

## 2015-09-19 DIAGNOSIS — N183 Chronic kidney disease, stage 3 unspecified: Secondary | ICD-10-CM

## 2015-09-19 MED ORDER — TORSEMIDE 20 MG PO TABS: 20.0000 mg | ORAL_TABLET | Freq: Every day | ORAL | Status: DC

## 2015-09-19 MED ORDER — RANOLAZINE ER 1000 MG PO TB12: 1000.0000 mg | ORAL_TABLET | Freq: Two times a day (BID) | ORAL | Status: DC

## 2015-09-19 MED ORDER — ISOSORBIDE MONONITRATE ER 60 MG PO TB24: 120.0000 mg | ORAL_TABLET | Freq: Every day | ORAL | Status: DC

## 2015-09-19 MED ORDER — TORSEMIDE 20 MG PO TABS: 40.0000 mg | ORAL_TABLET | Freq: Every day | ORAL | Status: DC

## 2015-09-19 NOTE — Patient Instructions (Signed)
Increase Ranexa to 1000 mg a day  Imdur to 120 mg a day  Follow up in 2 months  Torsemide 40 mg every am and 20 mg every other afternoon  Bmet in 10 days

## 2015-09-20 ENCOUNTER — Ambulatory Visit (INDEPENDENT_AMBULATORY_CARE_PROVIDER_SITE_OTHER): Payer: Medicare Other | Admitting: Pharmacist Clinician (PhC)/ Clinical Pharmacy Specialist

## 2015-09-20 DIAGNOSIS — Z7901 Long term (current) use of anticoagulants: Secondary | ICD-10-CM | POA: Diagnosis not present

## 2015-09-20 DIAGNOSIS — I482 Chronic atrial fibrillation: Secondary | ICD-10-CM | POA: Diagnosis not present

## 2015-09-20 DIAGNOSIS — I4821 Permanent atrial fibrillation: Secondary | ICD-10-CM

## 2015-09-20 LAB — POCT INR: INR: 2.9

## 2015-09-21 NOTE — Progress Notes (Signed)
Patient ID: [name], male   DOB: 15-Jul-1941, 74 y.o.   MRN: [id number] PCP: Dr. Thayer Headings Primary Cardiologist: Dr. [name] HF Cardiologist: Dr. [name]  HPI: [name] is a 74 yo male with a history of HTN, DM2, HLD, CAD s/p CABG 1998, ischemic cardiomyopathy, chronic angina, chronic atrial fibrillation, LBBB, rheumatoid arthritis on chronic steroids, and severe spinal stenosis.  He has a chronically elevated left hemidiaphragm.  He has left hip pain with adductor muscle tears on prior MRI.   In 11/15 had prolonged hospitalization with cardiogenic shock requiring IABP and milrinone. Suspect occlusion of SVG-PDA prior to this hospitalization.Last admitted to [facility] in 12/15 with chest pain. Cardiac enzymes negative. Chest discomfort thought to be from upper respiratory infection and not heart failure. Discharge weight 199 pounds.    He had placement of St Jude CRT-D system in 5/16.  He was started on ranolazine which helped with his angina.  He still has quite significant musculoskeletal disability.  He is walking only very short distances and has been like this for a long time. He has back pain and leg weakness/pain from spinal stenosis that are very limiting.  Weight is up 1 lb and he is short of breath with any walking.  He has been sleeping in a recliner for 3 years.  He is only BiV pacing 40-50% of the time due to atrial fibrillation.  Occasional dizziness, no falls.  More chest pain recently as he has been trying to walk more.  He has left-sided chest aching, relatively mild, after walking about 50 feet.  Never at rest.    Corevue was checked today and showed low impedance suggestive of fluid overload.   Labs (11/15): K 5.3, creatinine 1.98, BNP 15184 => 5069, hemoglobin 13.6  Labs (1/16): BNP 4338 Labs (01/16/2015): K 4.0 Creatinine 1.36   Labs (5/16): K 5 => 4, creatinine 1.59 => 1.58, BNP 574 Labs (6/16): K 4.5, creatinine 1.6 Labs (8/16): BNP 356, K 4.3, creatinine 1.5 Labs  (10/16): K 4.2, creatinine 1.69  Studies: Echo (10/15/14): EF 25%, mod calcified annulus mitral valve, mod MR, RV sys fx mildly reduced LHC (10/16/14): patent LIMA-LAD and SVG to OM, TO SVG-PDA with TO PDA and 60% proximal RCA, some left =>PDA collaterals;  RHC (10/16/14): RA 20, RV 69/11, PA 69/36 (35), PCWP 35, PA sat: 46% on 100% NRB, Fick CO/CI: 3.15/1.43, Thermo CO/CI: 3.61/1.63 ECG (11/15): atrial fibrillation with LBBB, QRS 132 msec.  ECHO (01/2015): EF 25-30%, mild MR.   ROS: All systems negative except as listed in HPI, PMH and Problem List.  SH:  Social History   Social History  . Marital Status: Married    Spouse Name: N/A  . Number of Children: N/A  . Years of Education: N/A   Occupational History  . Not on file.   Social History Main Topics  . Smoking status: Never Smoker   . Smokeless tobacco: Never Used  . Alcohol Use: No  . Drug Use: No  . Sexual Activity: Not Currently   Other Topics Concern  . Not on file   Social History Narrative    FH:  Family History  Problem Relation Age of Onset  . Heart disease Mother   . Heart disease Father   . Heart disease Brother   . Heart disease Maternal Aunt   . Cancer      Past Medical History  Diagnosis Date  . Pleuritis     h/o------ with negative vats biopsy  .  Coronary artery disease     a. CABG 1998. b. NSTEMI 02/2013: Progression of dz in SVG-RPDA followed by severe stenosis in grafted vessel, not likely safely reacted for PCI - med rx. c. NSTEMI 10/2014: patent LIMA-LAD and SVG to OM, TO SVG-PDA with TO PDA and 60% proximal RCA, some left =>PDA collaterals; culprit was likely occlusion of SVG-PDA though this may have been subacute given some collaterals.  . 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 Chronic anticoagulation, on coumadin 01/12/2013  . Cardiomyopathy, ischemic EF 25%   . Hypothyroidism   . GERD (gastroesophageal reflux disease)   . Chronic atrial  fibrillation (HCC)   . Digitalis toxicity   . Chronic systolic CHF (congestive heart failure) (HCC)     a. EF prev 25%, improved to 45-50% in 2014. b. 10/2014: EF back downt o 25%.  [name] Hyperlipidemia   . LBBB (left bundle branch block)   . CKD (chronic kidney disease), stage III   . Long term current use of systemic steroids     a. For RA.  [name] Spinal stenosis   . Hip pain     a. adductor muscle tears on prior MRI.  [name] Chronic chest pain     a. Intolerant of Ranexa.  . Hyperkalemia     a. Spironolactone d/c'd due to this.  . Elevated hemidiaphragm   . Moderate mitral regurgitation 10/2014 echo  . MI (myocardial infarction) (HCC)     "10/14/2014 I was told I'd had 2 MI's & didn't even know it" (11/27/2014)  . Type II diabetes mellitus (HCC)   . Rheumatoid arthritis (HCC)   . 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 aspirin EC 81 MG tablet Take 81 mg by mouth daily.    [name] atorvastatin (LIPITOR) 40 MG tablet Take 40 mg by mouth daily.  0  . fluticasone (FLONASE) 50 MCG/ACT nasal spray Place 2 sprays into both nostrils daily.     . hydrALAZINE (APRESOLINE) 25 MG tablet Take 1 tablet (25 mg total) by mouth 3 (three) times daily. 135 tablet 3  . isosorbide mononitrate (IMDUR) 60 MG 24 hr tablet Take 2 tablets (120 mg total) by mouth daily. 45 tablet 3  . levothyroxine (SYNTHROID, LEVOTHROID) 175 MCG tablet Take 175 mcg by mouth daily before breakfast.    . metoprolol succinate (TOPROL-XL) 100 MG 24 hr tablet TAKE ONE TABLET AND HALF BY MOUTH TWICE A DAY Take with or immediately following a meal.    . NITROSTAT 0.4 MG SL tablet PLACE 1 TABLET (0.4 MG TOTAL) UNDER THE TONGUE EVERY 5 (FIVE) MINUTES AS NEEDED FOR CHEST PAIN. 100 tablet 1  . nystatin cream (MYCOSTATIN) Apply 1 application topically daily as needed for dry skin.   4  . Omega-3 Fatty Acids (FISH OIL) 1000 MG CAPS  Take 3,000 mg by mouth every morning.    [name] omeprazole (PRILOSEC) 20 MG capsule Take 20 mg by mouth daily.    [name] oxyCODONE-acetaminophen (PERCOCET/ROXICET) 5-325 MG per tablet Take 1-2 tablets by mouth every 4 (four) hours as needed for severe pain.    . polyethylene glycol (MIRALAX / GLYCOLAX) packet Take 17 g by mouth daily as needed. (Patient taking differently: Take 17 g by mouth daily as needed (CONSTIPATION). ) 14 each 0  . predniSONE (DELTASONE) 5 MG tablet Take 1 tablet by mouth  2 (two) times daily.  1  . ranolazine (RANEXA) 1000 MG SR tablet Take 1 tablet (1,000 mg total) by mouth 2 (two) times daily. 60 tablet 11  . saxagliptin HCl (ONGLYZA) 5 MG TABS tablet Take 5 mg by mouth daily.    [name] senna (SENOKOT) 8.6 MG TABS tablet Take 1 tablet (8.6 mg total) by mouth daily. 120 each 0  . spironolactone (ALDACTONE) 25 MG tablet Take 0.5 tablets (12.5 mg total) by mouth daily. 15 tablet 3  . torsemide (DEMADEX) 20 MG tablet Take 2 tablets (40 mg total) by mouth daily. 60 tablet   . TOUJEO SOLOSTAR 300 UNIT/ML SOPN Inject 32 Units into the skin every morning.   4  . triamcinolone cream (KENALOG) 0.1 % Apply 1 application topically daily as needed (for rash).     . warfarin (COUMADIN) 5 MG tablet Take 1-2 tablets by mouth daily as directed (Patient taking differently: Take 7.5-10 mg by mouth daily at 6 PM. 5mg  on Friday and 7.5mg  all other days) 135 tablet 2  . torsemide (DEMADEX) 20 MG tablet Take 1 tablet (20 mg total) by mouth daily. Take 20 mg every other day in the afternoon 15 tablet 11   No current facility-administered medications for this encounter.    Filed Vitals:   09/19/15 1008  BP: 114/72  Pulse: 84  Weight: 215 lb (97.523 kg)  SpO2: 96%    PHYSICAL EXAM: General:No resp difficulty, in wheelchair, wife and son present HEENT: normal Neck: supple. JVP 8. Carotids 2+ bilaterally; no bruits. No lymphadenopathy or thryomegaly appreciated. Cor: PMI normal. Heart irregular S1/S2, no  S3/S4, 1/6 HSM at apex.  Lungs: Clear   Abdomen: soft, nontender, nondistended. No hepatosplenomegaly. No bruits or masses. Good bowel sounds. Extremities: no cyanosis, clubbing, rash.  Trace ankle edema bilaterally.  Neuro: alert & orientedx3, cranial nerves grossly intact. Moves all 4 extremities w/o difficulty. Affect pleasant.  ASSESSMENT & PLAN: 1) Chronic systolic heart failure: EF 25% (echo 01/2015), ischemic cardiomyopathy s/p CABG.  Patient was admitted to the hospital in 11/15 for acute/chronic systolic CHF with cardiogenic shock in the setting of NSTEMI. He had IABP placed and was diuresed with IV lasix and milrinone. He was weaned off milrinone.  He is very limited by orthopedic problems and does very little walking. NYHA class III symptoms, not just due to CHF (also orthopedic-related).  St Jude CRT-D system placed in 5/16 but he is only BiV pacing 40-50% of the time despite increasing Toprol XL.  He saw Dr Ladona Ridgel, and it was decided not to do AV nodal ablation because of his poor functional status from spinal stenosis.  Corevue and exam suggest ongoing volume overload.  - Increase torsemide to 40 mg daily followed by 20 every other day in the evening. BMET in 10 days.   - Continue current Toprol XL 150 mg bid.  - Decrease hydralazine to 25 mg tid with soft BP, continue Imdur but with more angina increase to 120 mg daily.   - Not on ACEI due to CKD. - Continue spironolactone.  - No digoxin due to history of digoxin toxicity.  - Reinforced the need and importance of daily weights, a low sodium diet, and fluid restriction (less than 2 L a day). Instructed to call the HF clinic if weight increases more than 3 lbs overnight or 5 lbs in a week.  2) CAD s/p CABG: Culprit for NSTEMI in 11/15 was likely occlusion of SVG-PDA.  There was no good target  for intervention. Trying to walk more and having more chest pain.   - Continue ASA, Toprol XL, and statin.  - Increase Ranexa to 1000 mg bid and  Imdur to 120 mg daily.  3) Dyspnea:  Probably due to combination of deconditioning, systolic CHF, and elevated left hemidiaphragm.  He does not have significant COPD, and he did not have interstitial lung disease on last chest CT.     4) Atrial fibrillation: Chronic. HR controlled, will continue Toprol and warfarin.   5) Immobility: Continue to try and be as active as possible.  Follow up in 2 months  [name] 09/21/2015

## 2015-09-27 ENCOUNTER — Ambulatory Visit (HOSPITAL_COMMUNITY)
Admission: RE | Admit: 2015-09-27 | Discharge: 2015-09-27 | Disposition: A | Discharge status: Home / Self Care | Payer: Medicare Other | Source: Ambulatory Visit | Attending: Cardiology | Admitting: Cardiology

## 2015-09-27 DIAGNOSIS — I5022 Chronic systolic (congestive) heart failure: Secondary | ICD-10-CM | POA: Diagnosis not present

## 2015-09-27 DIAGNOSIS — I5023 Acute on chronic systolic (congestive) heart failure: Secondary | ICD-10-CM | POA: Diagnosis present

## 2015-09-27 LAB — BASIC METABOLIC PANEL
Anion gap: 9 (ref 5–15)
BUN: 24 mg/dL — AB (ref 6–20)
CHLORIDE: 99 mmol/L — AB (ref 101–111)
CO2: 28 mmol/L (ref 22–32)
CREATININE: 1.88 mg/dL — AB (ref 0.61–1.24)
Calcium: 9.2 mg/dL (ref 8.9–10.3)
GFR calc Af Amer: 38 mL/min — ABNORMAL LOW (ref >60)
GFR calc non Af Amer: 33 mL/min — ABNORMAL LOW (ref >60)
GLUCOSE: 139 mg/dL — AB (ref 65–99)
Potassium: 3.9 mmol/L (ref 3.5–5.1)
SODIUM: 136 mmol/L (ref 135–145)

## 2015-10-04 ENCOUNTER — Ambulatory Visit (INDEPENDENT_AMBULATORY_CARE_PROVIDER_SITE_OTHER): Payer: Medicare Other | Admitting: Pharmacist Clinician (PhC)/ Clinical Pharmacy Specialist

## 2015-10-04 DIAGNOSIS — I4821 Permanent atrial fibrillation: Secondary | ICD-10-CM

## 2015-10-04 DIAGNOSIS — I482 Chronic atrial fibrillation: Secondary | ICD-10-CM | POA: Diagnosis not present

## 2015-10-04 DIAGNOSIS — Z7901 Long term (current) use of anticoagulants: Secondary | ICD-10-CM | POA: Diagnosis not present

## 2015-10-04 LAB — POCT INR: INR: 4

## 2015-10-14 ENCOUNTER — Other Ambulatory Visit: Payer: Self-pay | Admitting: Internal Medicine

## 2015-10-18 ENCOUNTER — Ambulatory Visit (INDEPENDENT_AMBULATORY_CARE_PROVIDER_SITE_OTHER): Payer: Medicare Other | Admitting: Pharmacist Clinician (PhC)/ Clinical Pharmacy Specialist

## 2015-10-18 DIAGNOSIS — I4821 Permanent atrial fibrillation: Secondary | ICD-10-CM

## 2015-10-18 DIAGNOSIS — Z7901 Long term (current) use of anticoagulants: Secondary | ICD-10-CM

## 2015-10-18 DIAGNOSIS — I482 Chronic atrial fibrillation: Secondary | ICD-10-CM | POA: Diagnosis not present

## 2015-10-18 LAB — POCT INR: INR: 3.8

## 2015-11-01 ENCOUNTER — Ambulatory Visit (INDEPENDENT_AMBULATORY_CARE_PROVIDER_SITE_OTHER): Payer: Medicare Other | Admitting: Pharmacist Clinician (PhC)/ Clinical Pharmacy Specialist

## 2015-11-01 ENCOUNTER — Other Ambulatory Visit (HOSPITAL_COMMUNITY): Payer: Self-pay | Admitting: Cardiology

## 2015-11-01 DIAGNOSIS — Z7901 Long term (current) use of anticoagulants: Secondary | ICD-10-CM | POA: Diagnosis not present

## 2015-11-01 DIAGNOSIS — I4821 Permanent atrial fibrillation: Secondary | ICD-10-CM

## 2015-11-01 DIAGNOSIS — I482 Chronic atrial fibrillation: Secondary | ICD-10-CM | POA: Diagnosis not present

## 2015-11-01 LAB — POCT INR: INR: 2.5

## 2015-11-05 ENCOUNTER — Ambulatory Visit (INDEPENDENT_AMBULATORY_CARE_PROVIDER_SITE_OTHER): Payer: Medicare Other | Admitting: *Deleted

## 2015-11-05 DIAGNOSIS — I5022 Chronic systolic (congestive) heart failure: Secondary | ICD-10-CM | POA: Diagnosis not present

## 2015-11-05 DIAGNOSIS — I255 Ischemic cardiomyopathy: Secondary | ICD-10-CM | POA: Diagnosis not present

## 2015-11-05 NOTE — Progress Notes (Signed)
Remote ICD transmission.   

## 2015-11-13 ENCOUNTER — Other Ambulatory Visit: Payer: Self-pay | Admitting: Cardiovascular Disease

## 2015-11-14 HISTORY — PX: NM MYOVIEW LTD: HXRAD82

## 2015-11-18 LAB — CUP PACEART REMOTE DEVICE CHECK
Battery Remaining Percentage: 90 %
HIGH POWER IMPEDANCE MEASURED VALUE: 87 Ohm
HIGH POWER IMPEDANCE MEASURED VALUE: 87 Ohm
Implantable Lead Implant Date: 20160523
Implantable Lead Implant Date: 20160523
Implantable Lead Location: 753860
Implantable Lead Model: 7122
Lead Channel Impedance Value: 430 Ohm
Lead Channel Impedance Value: 480 Ohm
Lead Channel Impedance Value: 490 Ohm
Lead Channel Pacing Threshold Pulse Width: 0.4 ms
Lead Channel Sensing Intrinsic Amplitude: 3 mV
Lead Channel Setting Pacing Amplitude: 2 V
Lead Channel Setting Pacing Amplitude: 2.5 V
Lead Channel Setting Pacing Pulse Width: 0.4 ms
Lead Channel Setting Pacing Pulse Width: 0.7 ms
MDC IDC LEAD IMPLANT DT: 20160523
MDC IDC LEAD LOCATION: 753857
MDC IDC LEAD LOCATION: 753859
MDC IDC MSMT BATTERY REMAINING LONGEVITY: 73 mo
MDC IDC MSMT BATTERY VOLTAGE: 3.1 V
MDC IDC MSMT LEADCHNL LV PACING THRESHOLD AMPLITUDE: 1 V
MDC IDC MSMT LEADCHNL LV PACING THRESHOLD PULSEWIDTH: 0.7 ms
MDC IDC MSMT LEADCHNL RV PACING THRESHOLD AMPLITUDE: 0.75 V
MDC IDC MSMT LEADCHNL RV SENSING INTR AMPL: 12 mV
MDC IDC PG SERIAL: 7272291
MDC IDC SESS DTM: 20161122070015
MDC IDC SET LEADCHNL RV SENSING SENSITIVITY: 0.5 mV

## 2015-11-19 ENCOUNTER — Encounter: Payer: Self-pay | Admitting: Cardiology

## 2015-11-22 ENCOUNTER — Ambulatory Visit (INDEPENDENT_AMBULATORY_CARE_PROVIDER_SITE_OTHER): Payer: Medicare Other | Admitting: Pharmacist Clinician (PhC)/ Clinical Pharmacy Specialist

## 2015-11-22 DIAGNOSIS — I482 Chronic atrial fibrillation: Secondary | ICD-10-CM | POA: Diagnosis not present

## 2015-11-22 DIAGNOSIS — Z7901 Long term (current) use of anticoagulants: Secondary | ICD-10-CM

## 2015-11-22 DIAGNOSIS — I4821 Permanent atrial fibrillation: Secondary | ICD-10-CM

## 2015-11-22 LAB — POCT INR: INR: 2.8

## 2015-11-26 ENCOUNTER — Other Ambulatory Visit: Payer: Self-pay | Admitting: Nurse Practitioner

## 2015-11-29 ENCOUNTER — Telehealth: Payer: Self-pay | Admitting: Cardiology

## 2015-11-29 NOTE — Telephone Encounter (Signed)
LMOVM requesting pt to send a manual transmission using home monitor because the home monitor has not updated in at least 8 days.

## 2015-12-03 ENCOUNTER — Encounter: Payer: Self-pay | Admitting: Cardiology

## 2015-12-03 ENCOUNTER — Ambulatory Visit (HOSPITAL_COMMUNITY)
Admission: RE | Admit: 2015-12-03 | Discharge: 2015-12-03 | Disposition: A | Discharge status: Home / Self Care | Payer: Medicare Other | Source: Ambulatory Visit | Attending: Cardiology | Admitting: Cardiology

## 2015-12-03 VITALS — BP 107/68 | HR 73 | Ht 72.0 in | Wt 211.8 lb

## 2015-12-03 DIAGNOSIS — I5022 Chronic systolic (congestive) heart failure: Secondary | ICD-10-CM | POA: Diagnosis not present

## 2015-12-03 DIAGNOSIS — I25700 Atherosclerosis of coronary artery bypass graft(s), unspecified, with unstable angina pectoris: Secondary | ICD-10-CM | POA: Diagnosis not present

## 2015-12-03 DIAGNOSIS — Z951 Presence of aortocoronary bypass graft: Secondary | ICD-10-CM | POA: Insufficient documentation

## 2015-12-03 DIAGNOSIS — N183 Chronic kidney disease, stage 3 unspecified: Secondary | ICD-10-CM

## 2015-12-03 DIAGNOSIS — R0789 Other chest pain: Secondary | ICD-10-CM | POA: Diagnosis not present

## 2015-12-03 DIAGNOSIS — I2510 Atherosclerotic heart disease of native coronary artery without angina pectoris: Secondary | ICD-10-CM | POA: Insufficient documentation

## 2015-12-03 DIAGNOSIS — I4891 Unspecified atrial fibrillation: Secondary | ICD-10-CM

## 2015-12-03 DIAGNOSIS — R0600 Dyspnea, unspecified: Secondary | ICD-10-CM | POA: Diagnosis not present

## 2015-12-03 LAB — BASIC METABOLIC PANEL
Anion gap: 9 (ref 5–15)
BUN: 22 mg/dL — AB (ref 6–20)
CHLORIDE: 105 mmol/L (ref 101–111)
CO2: 25 mmol/L (ref 22–32)
CREATININE: 1.78 mg/dL — AB (ref 0.61–1.24)
Calcium: 8.9 mg/dL (ref 8.9–10.3)
GFR calc non Af Amer: 35 mL/min — ABNORMAL LOW (ref >60)
GFR, EST AFRICAN AMERICAN: 41 mL/min — AB (ref >60)
Glucose, Bld: 302 mg/dL — ABNORMAL HIGH (ref 65–99)
POTASSIUM: 4.5 mmol/L (ref 3.5–5.1)
Sodium: 139 mmol/L (ref 135–145)

## 2015-12-03 LAB — LIPID PANEL
CHOL/HDL RATIO: 4.5 ratio
CHOLESTEROL: 113 mg/dL (ref 0–200)
HDL: 25 mg/dL — ABNORMAL LOW (ref >40)
LDL CALC: 22 mg/dL (ref 0–99)
TRIGLYCERIDES: 331 mg/dL — AB (ref <150)
VLDL: 66 mg/dL — AB (ref 0–40)

## 2015-12-03 LAB — BRAIN NATRIURETIC PEPTIDE: B Natriuretic Peptide: 558.7 pg/mL — ABNORMAL HIGH (ref 0.0–100.0)

## 2015-12-03 NOTE — Patient Instructions (Signed)
Labs today  Your physician has requested that you have a lexiscan myoview. For further information please visit [email or web address] Please follow instruction sheet, as given.  We will contact you in 2 months to schedule your next appointment.

## 2015-12-04 ENCOUNTER — Telehealth (HOSPITAL_COMMUNITY): Payer: Self-pay | Admitting: *Deleted

## 2015-12-04 NOTE — Progress Notes (Signed)
Patient ID: [name], male   DOB: Oct 07, 1941, 75 y.o.   MRN: [id number] PCP: Dr. Thayer Headings Primary Cardiologist: Dr. [name] HF Cardiologist: Dr. [name]  HPI: [name] is a 74 yo male with a history of HTN, DM2, HLD, CAD s/p CABG 1998, ischemic cardiomyopathy, chronic angina, chronic atrial fibrillation, LBBB, rheumatoid arthritis on chronic steroids, and severe spinal stenosis.  He has a chronically elevated left hemidiaphragm.  He has left hip pain with adductor muscle tears on prior MRI.   In 11/15 had prolonged hospitalization with cardiogenic shock requiring IABP and milrinone. Suspect occlusion of SVG-PDA prior to this hospitalization.Last admitted to [facility] in 12/15 with chest pain. Cardiac enzymes negative. Chest discomfort thought to be from upper respiratory infection and not heart failure. Discharge weight 199 pounds.    He had placement of St Jude CRT-D system in 5/16.  He was started on ranolazine which helped with his angina.  He still has quite significant musculoskeletal disability.  He is walking only very short distances and has been like this for a long time. He has back pain and leg weakness/pain from spinal stenosis that are very limiting.  Weight is down 4 lbs with increase in torsemide at last appointment. He is still short of breath with any walking.  He has been sleeping in a recliner for 3 years.  He is only BiV pacing 40-50% of the time due to atrial fibrillation.  Occasional dizziness, no falls.  He continues to have exertional chest pain despite increasing ranolazine to 1000 mg bid.  This occurs when he walks to the bathroom, but not every time.  This is about his most heavy exertion.  It will occur several times a week and resolves with rest. He continues on chronic prednisone for rheumatoid arthritis.  Labs (11/15): K 5.3, creatinine 1.98, BNP 15184 => 5069, hemoglobin 13.6  Labs (1/16): BNP 4338 Labs (01/16/2015): K 4.0 Creatinine 1.36   Labs (5/16): K 5 => 4,  creatinine 1.59 => 1.58, BNP 574 Labs (6/16): K 4.5, creatinine 1.6 Labs (8/16): BNP 356, K 4.3, creatinine 1.5 Labs (10/16): K 4.2, creatinine 1.69 => 1.88  Studies: Echo (10/15/14): EF 25%, mod calcified annulus mitral valve, mod MR, RV sys fx mildly reduced LHC (10/16/14): patent LIMA-LAD and SVG to OM, TO SVG-PDA with TO PDA and 60% proximal RCA, some left =>PDA collaterals;  RHC (10/16/14): RA 20, RV 69/11, PA 69/36 (35), PCWP 35, PA sat: 46% on 100% NRB, Fick CO/CI: 3.15/1.43, Thermo CO/CI: 3.61/1.63 ECG (11/15): atrial fibrillation with LBBB, QRS 132 msec.  ECHO (01/2015): EF 25-30%, mild MR.   ROS: All systems negative except as listed in HPI, PMH and Problem List.  SH:  Social History   Social History  . Marital Status: Married    Spouse Name: N/A  . Number of Children: N/A  . Years of Education: N/A   Occupational History  . Not on file.   Social History Main Topics  . Smoking status: Never Smoker   . Smokeless tobacco: Never Used  . Alcohol Use: No  . Drug Use: No  . Sexual Activity: Not Currently   Other Topics Concern  . Not on file   Social History Narrative    FH:  Family History  Problem Relation Age of Onset  . Heart disease Mother   . Heart disease Father   . Heart disease Brother   . Heart disease Maternal Aunt   . Cancer      Past  Medical History  Diagnosis Date  . Pleuritis     h/o------ with negative vats biopsy  . Coronary artery disease     a. CABG 1998. b. NSTEMI 02/2013: Progression of dz in SVG-RPDA followed by severe stenosis in grafted vessel, not likely safely reacted for PCI - med rx. c. NSTEMI 10/2014: patent LIMA-LAD and SVG to OM, TO SVG-PDA with TO PDA and 60% proximal RCA, some left =>PDA collaterals; culprit was likely occlusion of SVG-PDA though this may have been subacute given some collaterals.  . 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 Chronic anticoagulation, on coumadin 01/12/2013  .  Cardiomyopathy, ischemic EF 25%   . Hypothyroidism   . GERD (gastroesophageal reflux disease)   . Chronic atrial fibrillation (HCC)   . Digitalis toxicity   . Chronic systolic CHF (congestive heart failure) (HCC)     a. EF prev 25%, improved to 45-50% in 2014. b. 10/2014: EF back downt o 25%.  [name] Hyperlipidemia   . LBBB (left bundle branch block)   . CKD (chronic kidney disease), stage III   . Long term current use of systemic steroids     a. For RA.  [name] Spinal stenosis   . Hip pain     a. adductor muscle tears on prior MRI.  [name] Chronic chest pain     a. Intolerant of Ranexa.  . Hyperkalemia     a. Spironolactone d/c'd due to this.  . Elevated hemidiaphragm   . Moderate mitral regurgitation 10/2014 echo  . MI (myocardial infarction) (HCC)     "10/14/2014 I was told I'd had 2 MI's & didn't even know it" (11/27/2014)  . Type II diabetes mellitus (HCC)   . Rheumatoid arthritis (HCC)   . 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 aspirin EC 81 MG tablet Take 81 mg by mouth daily.    [name] atorvastatin (LIPITOR) 40 MG tablet Take 40 mg by mouth daily.  0  . fluticasone (FLONASE) 50 MCG/ACT nasal spray Place 2 sprays into both nostrils daily.     . hydrALAZINE (APRESOLINE) 25 MG tablet Take 1 tablet (25 mg total) by mouth 3 (three) times daily. 135 tablet 3  . insulin lispro (HUMALOG) 100 UNIT/ML injection Inject 8 Units into the skin 2 (two) times daily before a meal.    . isosorbide mononitrate (IMDUR) 60 MG 24 hr tablet Take 2 tablets (120 mg total) by mouth daily. 45 tablet 3  . levothyroxine (SYNTHROID, LEVOTHROID) 175 MCG tablet Take 175 mcg by mouth daily before breakfast.    . metoprolol succinate (TOPROL-XL) 100 MG 24 hr tablet TAKE ONE TABLET AND HALF BY MOUTH TWICE A DAY Take with or immediately following a meal.    . NITROSTAT 0.4 MG SL tablet PLACE 1 TABLET (0.4  MG TOTAL) UNDER THE TONGUE EVERY 5 (FIVE) MINUTES AS NEEDED FOR CHEST PAIN. 100 tablet 1  . nystatin cream (MYCOSTATIN) Apply 1 application topically daily as needed for dry skin.   4  . Omega-3 Fatty Acids (FISH OIL) 1000 MG CAPS Take 3,000 mg by mouth every morning.    [name] omeprazole (PRILOSEC) 20 MG capsule Take 20 mg by mouth daily.    . polyethylene glycol (MIRALAX / GLYCOLAX) packet Take 17 g by mouth daily as needed. (Patient taking differently: Take 17 g by mouth daily as  needed (CONSTIPATION). ) 14 each 0  . predniSONE (DELTASONE) 5 MG tablet Take 1 tablet by mouth 2 (two) times daily.  1  . ranolazine (RANEXA) 1000 MG SR tablet Take 1 tablet (1,000 mg total) by mouth 2 (two) times daily. 60 tablet 11  . senna (SENOKOT) 8.6 MG TABS tablet Take 1 tablet (8.6 mg total) by mouth daily. 120 each 0  . spironolactone (ALDACTONE) 25 MG tablet TAKE 1/2 TABLET DAILY 15 tablet 3  . torsemide (DEMADEX) 20 MG tablet Take 2 tablets (40 mg total) by mouth daily. 60 tablet   . torsemide (DEMADEX) 20 MG tablet Take 1 tablet (20 mg total) by mouth daily. Take 20 mg every other day in the afternoon 15 tablet 11  . torsemide (DEMADEX) 20 MG tablet TAKE 2 TABLETS BY MOUTH EVERY DAY 180 tablet 1  . TOUJEO SOLOSTAR 300 UNIT/ML SOPN Inject 32 Units into the skin every morning.   4  . triamcinolone cream (KENALOG) 0.1 % Apply 1 application topically daily as needed (for rash).     . warfarin (COUMADIN) 5 MG tablet Take 1-2 tablets by mouth daily as directed (Patient taking differently: Take 7.5-10 mg by mouth daily at 6 PM. 5mg  on Friday and 7.5mg  all other days) 135 tablet 2  . warfarin (COUMADIN) 5 MG tablet TAKE 1 TO 2 TABLETS BY MOUTH EVERY DAY AS DIRECTED 135 tablet 1  . metoprolol succinate (TOPROL-XL) 100 MG 24 hr tablet TAKE 1 & 1/2 TABLETS BY MOUTH TWICE A DAY 260 tablet 2  . oxyCODONE-acetaminophen (PERCOCET/ROXICET) 5-325 MG per tablet Take 1-2 tablets by mouth every 4 (four) hours as needed for severe  pain. Reported on 12/03/2015     No current facility-administered medications for this encounter.    Filed Vitals:   12/03/15 1343  BP: 107/68  Pulse: 73  Height: 6' (1.829 m)  Weight: 211 lb 12.8 oz (96.072 kg)  SpO2: 95%    PHYSICAL EXAM: General:No resp difficulty, in wheelchair, wife and son present HEENT: normal Neck: supple. JVP 7. Carotids 2+ bilaterally; no bruits. No lymphadenopathy or thryomegaly appreciated. Cor: PMI normal. Heart irregular S1/S2, no S3/S4, 1/6 HSM at apex.  Lungs: Clear   Abdomen: soft, nontender, nondistended. No hepatosplenomegaly. No bruits or masses. Good bowel sounds. Extremities: no cyanosis, clubbing, rash.  No edema.  Neuro: alert & orientedx3, cranial nerves grossly intact. Moves all 4 extremities w/o difficulty. Affect pleasant.  ASSESSMENT & PLAN: 1) Chronic systolic heart failure: EF 25% (echo 01/2015), ischemic cardiomyopathy s/p CABG.  Patient was admitted to the hospital in 11/15 for acute/chronic systolic CHF with cardiogenic shock in the setting of NSTEMI. He had IABP placed and was diuresed with IV lasix and milrinone. He was weaned off milrinone.  He is very limited by orthopedic problems and does very little walking. NYHA class III symptoms, not just due to CHF (also orthopedic-related).  St Jude CRT-D system placed in 5/16 but he is only BiV pacing 40-50% of the time despite increasing Toprol XL.  He saw Dr 6/16, and it was decided not to do AV nodal ablation because of his poor functional status from spinal stenosis.  Volume status today looks ok and weight is down. - Continue torsemide 40 mg daily followed by 20 every other day in the evening. BMET/BNP today. - Continue current Toprol XL 150 mg bid.  - Continue hydralazine/Imdur.   - Not on ACEI due to CKD. - Continue spironolactone.  - No digoxin due to history  of digoxin toxicity.  - Reinforced the need and importance of daily weights, a low sodium diet, and fluid restriction  (less than 2 L a day). Instructed to call the HF clinic if weight increases more than 3 lbs overnight or 5 lbs in a week.  2) CAD s/p CABG: Culprit for NSTEMI in 11/15 was likely occlusion of SVG-PDA.  There was no good target for intervention. He continues to have exertional chest pain despite maximum medical management.     - Continue Toprol XL and statin.  - Continue Ranexa 1000 mg bid and Imdur 120 mg daily.  - I will arrange for Lexiscan Cardiolite.  I would probably only consider cardiac cath if there were a very large area of ischemia.  Most likely medical management given known severe disease from prior cath that did not appear amenable intervention, as well as stage III-IV CKD. - Can stop ASA given warfarin use. 3) Dyspnea:  Probably due to combination of deconditioning, systolic CHF, and elevated left hemidiaphragm.  He does not have significant COPD, and he did not have interstitial lung disease on last chest CT.     4) Atrial fibrillation: Chronic. HR controlled, will continue Toprol and warfarin.   5) Immobility: Continue to try and be as active as possible.  Follow up in 2 months  Marca Ancona 12/04/2015

## 2015-12-04 NOTE — Telephone Encounter (Signed)
Patient given detailed instructions per Myocardial Perfusion Study Information Sheet for the test on 12/06/15 at 930  Patient notified to arrive 15 minutes early and that it is imperative to arrive on time for appointment to keep from having the test rescheduled.  If you need to cancel or reschedule your appointment, please call the office within 24 hours of your appointment. Failure to do so may result in a cancellation of your appointment, and a $50 no show fee. Patient verbalized understanding.Antionette Char, RN

## 2015-12-06 ENCOUNTER — Ambulatory Visit (HOSPITAL_COMMUNITY): Payer: Medicare Other | Attending: Cardiology

## 2015-12-06 DIAGNOSIS — I10 Essential (primary) hypertension: Secondary | ICD-10-CM | POA: Insufficient documentation

## 2015-12-06 DIAGNOSIS — E119 Type 2 diabetes mellitus without complications: Secondary | ICD-10-CM | POA: Diagnosis not present

## 2015-12-06 DIAGNOSIS — R0789 Other chest pain: Secondary | ICD-10-CM | POA: Insufficient documentation

## 2015-12-06 DIAGNOSIS — R9439 Abnormal result of other cardiovascular function study: Secondary | ICD-10-CM | POA: Diagnosis not present

## 2015-12-06 DIAGNOSIS — I447 Left bundle-branch block, unspecified: Secondary | ICD-10-CM | POA: Insufficient documentation

## 2015-12-06 LAB — MYOCARDIAL PERFUSION IMAGING
CHL CUP NUCLEAR SDS: 4
CHL CUP NUCLEAR SSS: 20
LHR: 0.4
Peak HR: 113 {beats}/min
Rest HR: 81 {beats}/min
SRS: 18
TID: 1.03

## 2015-12-06 MED ORDER — TECHNETIUM TC 99M SESTAMIBI GENERIC - CARDIOLITE
10.2000 | Freq: Once | INTRAVENOUS | Status: AC | PRN
  Administered 2015-12-06: 10 via INTRAVENOUS

## 2015-12-06 MED ORDER — REGADENOSON 0.4 MG/5ML IV SOLN
0.4000 mg | Freq: Once | INTRAVENOUS | Status: AC
  Administered 2015-12-06: 0.4 mg via INTRAVENOUS

## 2015-12-06 MED ORDER — TECHNETIUM TC 99M SESTAMIBI GENERIC - CARDIOLITE
30.1000 | Freq: Once | INTRAVENOUS | Status: AC | PRN
Start: 2015-12-06 — End: 2015-12-06
  Administered 2015-12-06: 30.1 via INTRAVENOUS

## 2015-12-19 ENCOUNTER — Ambulatory Visit (INDEPENDENT_AMBULATORY_CARE_PROVIDER_SITE_OTHER): Payer: PPO | Admitting: Cardiology

## 2015-12-19 ENCOUNTER — Ambulatory Visit (INDEPENDENT_AMBULATORY_CARE_PROVIDER_SITE_OTHER): Payer: PPO | Admitting: Pharmacist Clinician (PhC)/ Clinical Pharmacy Specialist

## 2015-12-19 VITALS — BP 104/70 | HR 92 | Ht 72.0 in | Wt 212.6 lb

## 2015-12-19 DIAGNOSIS — Z7901 Long term (current) use of anticoagulants: Secondary | ICD-10-CM

## 2015-12-19 DIAGNOSIS — I5042 Chronic combined systolic (congestive) and diastolic (congestive) heart failure: Secondary | ICD-10-CM | POA: Diagnosis not present

## 2015-12-19 DIAGNOSIS — I10 Essential (primary) hypertension: Secondary | ICD-10-CM

## 2015-12-19 DIAGNOSIS — I482 Chronic atrial fibrillation: Secondary | ICD-10-CM

## 2015-12-19 DIAGNOSIS — I25719 Atherosclerosis of autologous vein coronary artery bypass graft(s) with unspecified angina pectoris: Secondary | ICD-10-CM | POA: Diagnosis not present

## 2015-12-19 DIAGNOSIS — R531 Weakness: Secondary | ICD-10-CM

## 2015-12-19 DIAGNOSIS — I4821 Permanent atrial fibrillation: Secondary | ICD-10-CM

## 2015-12-19 DIAGNOSIS — M4800 Spinal stenosis, site unspecified: Secondary | ICD-10-CM

## 2015-12-19 LAB — POCT INR: INR: 3.3

## 2015-12-19 NOTE — Patient Instructions (Signed)
No changes in current medications  Keep appointment with HEART FAILURE CLINIC-  FEB 2017 DISCUSS WITH DR [facility] ABOUT MOTORIZED WHEELCHAIR.    Your physician wants you to follow-up in 12 MONTHS DR HARDING.  You will receive a reminder letter in the mail two months in advance. If you don't receive a letter, please call our office to schedule the follow-up appointment.  If you need a refill on your cardiac medications before your next appointment, please call your pharmacy.

## 2015-12-19 NOTE — Progress Notes (Signed)
PCP: Thressa Sheller, MD  Clinic Note: Chief Complaint  Patient presents with  . Follow-up    AFIB//pt c/o chest pain (sharp pain and pressure) on exertion, takes Nitro.  [name] Shortness of Breath    constant  . Coronary Artery Disease    HPI: [name] is a 75 y.o. male with a PMH below who presents today for ~6 month f/u. He has a history of HTN, DM2, HLD, CAD s/p CABG 1998, ischemic cardiomyopathy, chronic angina, chronic atrial fibrillation, LBBB, rheumatoid arthritis on chronic steroids, and severe spinal stenosis. He has a chronically elevated left hemidiaphragm. 05/06/15 had  implantation of a STJ CRTD (BiVICD) with an ischemic CM (EF 25), NYHA Class III CHF, and LBBB QRS morophology. He met MADIT II/ SCD-HeFT criteria for ICD implantation for primary prevention of sudden death .    AEDIN JEANSONNE was last seen in July 2016 by Cecilie Kicks -- he had been seeing Dr. Aundra Dubin (CHF service) & prior to that, Dr. Gwenlyn Found.  I took care of him for a while in the hospital. I struck but close relationship with the patient and his family during this hospital visits. They have requested to switch from Dr. Gwenlyn Found to me. But they are continuing to go to the heart failure clinic. Would be for me to be his interventionalists, and Dr. Aundra Dubin to continue to treat his heart failure and other chronic cardiac problems.  He was seen by Dr. Lovena Le (EP) in Aug 2016 for BiV ICD f/u -- was placed in May 2016.[name]  Discussed AV Node Ablation but not advised b/c BiV pacing reduced with limited activity 2/2 spinal stenosis. Seen by Dr. Aundra Dubin on Dec 03, 2015.  Recent Hospitalizations: None since his ICD in August  -- unfortunately, he does not seem to be benefiting much from IV pacing as this is only a roughly 40-50% of the time as result of his baseline atrial fibrillation.  Studies Reviewed: 12/06/2015 -- Scar without Ischemia - Large, severe fixed defect in LCx distribution +/- dLAD  Interval History:  [name] presents here today sitting in a wheelchair. He is very frail and weak appearing now. Almost wheelchair bound. Any activities limited by significant back pain and leg weakness from spinal stenosis. Only able to walk very short distances. He was happy to hear the results of his stress test that was just done. Based on these results, the plan would be just optimize medical management and not to pursue any invasive evaluation. Unfortunately he does not have many PCI targets from last few catheterizations. He continues to sleep in recliner as he has done for several years now. He has significant orthopnea, but otherwise is also troubled with back pain limiting and to sleep on recliner. He doesn't really walk around enough to know if he has exertional dyspnea. He does however have chronic dyspnea since his lung surgery that has had exacerbations. It is improved somewhat since his last visit with Dr. Aundra Dubin. He does have anginal type symptoms and significant dyspnea was simply going to the bathroom. Even with straining as symptoms. He is now back on Ranexa, there had been some concern about intolerance. His weights have our been stable and his Lasix doses have been stable since his last visit with Dr. Aundra Dubin.   No chest pain or shortness of breath with rest or exertion. No PND, orthopnea or edema. No palpitations, lightheadedness, dizziness, weakness or syncope/near syncope. No TIA/amaurosis fugax symptoms. No melena, hematochezia, hematuria, or epstaxis. No claudication.  ROS: A comprehensive was performed. Review of Systems  Constitutional: Negative for weight loss (He did lose some weight when he went up on his Lasix dosing.).  Respiratory: Positive for shortness of breath (at baseline). Negative for cough.   Cardiovascular: Positive for leg swelling (Stable). Negative for claudication.  Gastrointestinal: Negative for blood in stool and melena.  Genitourinary: Negative for dysuria and hematuria.    Musculoskeletal: Positive for back pain and joint pain. Negative for falls.       Very unsteady gait  Neurological: Positive for tremors and weakness (Diffuse). Negative for focal weakness, loss of consciousness and headaches.  Endo/Heme/Allergies: Bruises/bleeds easily.     Past Medical History  Diagnosis Date  . MI (myocardial infarction) ([place])     "10/14/2014 I was told I'd had 2 MI's & didn't even know it" (11/27/2014)  . Coronary artery disease involving native coronary artery     a. CABG 1998. b. MYOVIEW 11/2015: Large, Severe FIXED Defect in Cx distribution +/- dLAD.  SCAR w/o Ishcemia.   . 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 NSTEMI 02/2013: Progression of dz in SVG-RPDA followed by severe stenosis in grafted vessel, not likely safe for PCI - med rx. b. NSTEMI 10/2014: patent LIMA-LAD and SVG to OM, TO SVG-PDA with TO PDA and 60% proximal RCA, some left =>PDA collaterals; culprit was likely occlusion of SVG-PDA though this may have been subacute given some collaterals.  . Chronic chest pain   . Cardiomyopathy, ischemic 1998    s/p ICD; Intolerant of Ranexa.  . Chronic combined systolic and diastolic CHF, NYHA class 3 (HCC)     a. EF prev 25%, improved to 45-50% in 2014. b. 10/2014: EF back downt o 25%.  . Chronic atrial fibrillation ([place])     Rate controlled - Now with ICD/PPM; On Coumadin  . Digitalis toxicity   . Hyperkalemia     a. Spironolactone d/c'd due to this.  [name] LBBB (left bundle branch block)   . Moderate mitral regurgitation by prior echocardiogram 10/2014 echo  . CKD (chronic kidney disease), stage III   . Hyperlipidemia LDL goal <70   . Moderate mitral regurgitation 10/2014 echo  . Type II diabetes mellitus ([place])   . Rheumatoid arthritis ([place])   . Long term current use of systemic steroids     for RA  . Hypothyroidism   . 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 Pleuritis, purulent ([place])      h/o------ with negative vats biopsy; 1994  . 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 Spinal stenosis     Very limited mobility - almost wheelchair bound  . Hip pain     a. adductor muscle tears on prior MRI.  [name] GERD (gastroesophageal reflux disease)     Past Surgical History  Procedure Laterality Date  . Lung surgery  1994    "infection in lungs; scraped it out on right; 6 wks later they did left; clipped left diaphragm & paralyzed it"  . Total knee arthroplasty Bilateral 2000's  . Carpal tunnel release Bilateral 1990's  . Cataract extraction w/ intraocular lens  implant, bilateral Bilateral 1990's  . Cardiac catheterization Left 01/11/2013    Severe CAD with notable progression of disease in the SVG-RPDA followed by severe stenosis in grafted vessel, region not safely "reachede" for PCI, moderate  ischemic CM no notable chage in EF, continue medical therapy  . Cardiac catheterization  04/03/2011    Left circumflex 95% stenosis, essentially unchanged from last cath  . Cardiac catheterization  04/01/2010    LAD 99% proximal stenosis with vague flow, circumflex 80% stenosis, essentially unchanged form last cath  . Cardiac catheterization  03/28/2010    Mild pulmonary hypertension, medical therapy  . Cardiac catheterization  04/30/2009    LAD 95% stenosis, essentially unchanged fomr last cath, medical therapy  . Cardiac catheterization  12/05/2008    2.5x12 apex, predilation in the distal PLA branch, 3x23 XIENCE V durg eluting stent and post dilated with a 3.25x20 [place] VOYAGER at 15 atmospheres  . Left heart catheterization with coronary/graft angiogram N/A 01/13/2013    Procedure: LEFT HEART CATHETERIZATION WITH Beatrix Fetters;  Surgeon: Leonie Man, MD;  Location[facility] CATH LAB;  Service: Cardiovascular;  Laterality: N/A;  . Left and right heart catheterization with coronary/graft angiogram N/A  10/16/2014    Procedure: LEFT AND RIGHT HEART CATHETERIZATION WITH Beatrix Fetters;  Surgeon: Leonie Man, MD;  Location[facility] CATH LAB;  Service: Cardiovascular;  Laterality: N/A;  . Coronary artery bypass graft  05/1997    "CABG X7"  . Patella fracture surgery Right "before replacement"  . Knee arthroscopy Left "before replacement  . Ep implantable device N/A 05/06/2015    Procedure: BiV ICD Insertion CRT-D;  Surgeon: Evans Lance, MD;  Location: [place] CV LAB;  Service: Cardiovascular;  Laterality: N/A;  . Nm myoview ltd  11/2015     Large, Severe FIXED Defect in Cx distribution +/- dLAD.  SCAR w/o Ishcemia.    Studies: Echo (10/15/14): EF 25%, mod calcified annulus mitral valve, mod MR, RV sys fx mildly reduced LHC (10/16/14): patent LIMA-LAD and SVG to OM, TO SVG-PDA with TO PDA and 60% proximal RCA, some left =>PDA collaterals;  RHC (10/16/14): RA 20, RV 69/11, PA 69/36 (35), PCWP 35, PA sat: 46% on 100% NRB, Fick CO/CI: 3.15/1.43, Thermo CO/CI: 3.61/1.63 ECG (11/15): atrial fibrillation with LBBB, QRS 132 msec.  ECHO (01/2015): EF 25-30%, mild MR.   Prior to Admission medications   Medication Sig Start Date End Date Taking? Authorizing Provider  atorvastatin (LIPITOR) 40 MG tablet Take 40 mg by mouth daily. 09/09/15  Yes Historical Provider, MD  fluticasone (FLONASE) 50 MCG/ACT nasal spray Place 2 sprays into both nostrils daily.    Yes Historical Provider, MD  hydrALAZINE (APRESOLINE) 25 MG tablet Take 1 tablet (25 mg total) by mouth 3 (three) times daily. 08/20/15  Yes Larey Dresser, MD  insulin lispro (HUMALOG) 100 UNIT/ML injection Inject 8 Units into the skin 2 (two) times daily before a meal.   Yes Historical Provider, MD  isosorbide mononitrate (IMDUR) 60 MG 24 hr tablet Take 2 tablets (120 mg total) by mouth daily. 09/19/15  Yes Larey Dresser, MD  levothyroxine (SYNTHROID, LEVOTHROID) 175 MCG tablet Take 175 mcg by mouth daily before breakfast.   Yes Historical  Provider, MD  metoprolol succinate (TOPROL-XL) 100 MG 24 hr tablet TAKE 1 & 1/2 TABLETS BY MOUTH TWICE A DAY 11/26/15  Yes Amber Sena Slate, NP  NITROSTAT 0.4 MG SL tablet PLACE 1 TABLET (0.4 MG TOTAL) UNDER THE TONGUE EVERY 5 (FIVE) MINUTES AS NEEDED FOR CHEST PAIN. 03/21/15  Yes Lorretta Harp, MD  nystatin cream (MYCOSTATIN) Apply 1 application topically daily as needed for dry skin.  03/19/15  Yes Historical Provider, MD  Omega-3 Fatty Acids (  FISH OIL) 1000 MG CAPS Take 3,000 mg by mouth every morning.   Yes Historical Provider, MD  omeprazole (PRILOSEC) 20 MG capsule Take 20 mg by mouth daily.   Yes Historical Provider, MD  oxyCODONE-acetaminophen (PERCOCET/ROXICET) 5-325 MG per tablet Take 1-2 tablets by mouth every 4 (four) hours as needed for severe pain. Reported on 12/03/2015   Yes Historical Provider, MD  polyethylene glycol (MIRALAX / GLYCOLAX) packet Take 17 g by mouth daily as needed. Patient taking differently: Take 17 g by mouth daily as needed (CONSTIPATION).  11/30/14  Yes Rhonda G Barrett, PA-C  predniSONE (DELTASONE) 5 MG tablet Take 1 tablet by mouth 2 (two) times daily. 06/04/15  Yes Historical Provider, MD  ranolazine (RANEXA) 1000 MG SR tablet Take 1 tablet (1,000 mg total) by mouth 2 (two) times daily. 09/19/15  Yes Larey Dresser, MD  spironolactone (ALDACTONE) 25 MG tablet TAKE 1/2 TABLET DAILY 11/01/15  Yes Larey Dresser, MD  torsemide (DEMADEX) 20 MG tablet Take 2 tablets (40 mg total) by mouth daily. 09/19/15  Yes Larey Dresser, MD  torsemide (DEMADEX) 20 MG tablet Take 1 tablet (20 mg total) by mouth daily. Take 20 mg every other day in the afternoon 09/19/15  Yes Larey Dresser, MD  TOUJEO SOLOSTAR 300 UNIT/ML SOPN Inject 32 Units into the skin every morning.  02/11/15  Yes Historical Provider, MD  triamcinolone cream (KENALOG) 0.1 % Apply 1 application topically daily as needed (for rash).  02/20/13  Yes Historical Provider, MD  warfarin (COUMADIN) 5 MG tablet Take 1-2  tablets by mouth daily as directed Patient taking differently: Take 7.5-10 mg by mouth daily at 6 PM. 32m on Friday and 7.51mall other days 10/10/14  Yes KrRockne MenghiniRPH-CPP   Allergies  Allergen Reactions  . Butrans [Buprenorphine] Shortness Of Breath  . Influenza Vaccines Other (See Comments)    Severe coughing for weeks after vaccine; also had flu like symptoms for about 7 to 8 weeks.   . Lanoxin [Digoxin] Other (See Comments)    Nausea, anorexia, caused tremendous weight loss  . Ranexa [Ranolazine Er] Other (See Comments)    TOLD NEVER TO TAKE MED-PER MD MD put him back on this medication and is tolerating well  . Lisinopril Other (See Comments)    cough  . Clarithromycin Other (See Comments)    Unknown, thinks "blisters in mouth"  . Statins Other (See Comments)    unknown    Social History   Social History  . Marital Status: Married    Spouse Name: N/A  . Number of Children: N/A  . Years of Education: N/A   Social History Main Topics  . Smoking status: Never Smoker   . Smokeless tobacco: Never Used  . Alcohol Use: No  . Drug Use: No  . Sexual Activity: Not Currently   Other Topics Concern  . None   Social History Narrative   Family History  Problem Relation Age of Onset  . Heart disease Mother   . Heart disease Father   . Heart disease Brother   . Heart disease Maternal Aunt   . Cancer      Wt Readings from Last 3 Encounters:  12/19/15 212 lb 9.6 oz (96.435 kg)  12/03/15 211 lb 12.8 oz (96.072 kg)  09/19/15 215 lb (97.523 kg)    PHYSICAL EXAM BP 104/70 mmHg  Pulse 92  Ht 6' (1.829 m)  Wt 212 lb 9.6 oz (96.435 kg)  BMI 28.83  kg/m2 General appearance: alert, cooperative, appears stated age, at baseline he is in mild distress with some accessory muscle use for breathing. He is sitting in a wheelchair. Seems very weak Neck: no adenopathy, no carotid bruit and mild JVD ~7-8 cm H20 Lungs: Mostly clear to auscultation bilaterally without  significant wheezing, rales or rhonchi. non-labored - but he does have some accessory muscle use. Decreased breath sounds in the bases. Heart: Irregularly irregular rhythm with normal S1 and split S2. He does have a 1-2/6 HSM at the apex; somewhat lateral and sustained PMI but not significant. Abdomen: soft, non-tender; bowel sounds normal; no masses,  no organomegaly; no significant HJR. Extremities: extremities normal, atraumatic, no cyanosis, and edema minimal Pulses: 2+ and symmetric;  Neurologic: Mental status: Alert, oriented, thought content appropriate Cranial nerves: normal (II-XII grossly intact). Pleasant mood and affect.    Adult ECG Report  Rate: 92 ;  Rhythm: Baseline rhythm appears to be atrial fibrillation. Intermittent ventricular pacing. Left axis deviation with apparent LBBB.  Cannot rule out inferior and lateral infarct, age undetermined;   Narrative Interpretation: Stable EKG.   Other studies Reviewed: Additional studies/ records that were reviewed today include:  Recent Labs:   Lab Results  Component Value Date   CREATININE 1.78* 12/03/2015  - stable   Lab Results  Component Value Date   CHOL 113 12/03/2015   HDL 25* 12/03/2015   LDLCALC 22 12/03/2015   TRIG 331* 12/03/2015   CHOLHDL 4.5 12/03/2015  - ?? Has up & down LDL levels (was 115 in 2014).  BNP 12/03/15: 558 (was 573 in May)   ASSESSMENT / PLAN:  Mr. Pilar is well known to me from his hospital stays. I have cared for him in the hospital, but have never seen in clinic. This was basically a "getting to know you visit "in order to establish myself as his primary interventional cardiologist. I still think he is best served in the heart failure clinic. As it stands now he doesn't have any true PCI targets that would warrant invasive evaluation. He is on the best medical regimen can beyond based on his blood pressure levels. Unfortunately the extent of his CAD and heart failure symptoms as well as his  baseline dyspnea from lung disease and immobility from spinal stenosis have left him severely immobilized. I did not go into discussion about end-of-life care and CODE STATUS during this visit, however I do think this should be addressed. I mentioned motorized wheelchair as an option for helping his mobility. At this point I think we are mostly looking for improving quality of life with palliative measures.  Problem List Items Addressed This Visit    Weakness (Chronic)   Spinal stenosis (Chronic)    Chronic back and leg pain and weakness that has an extremely debilitated. This combined with his profound weakness and exertional dyspnea and angina make me think that he would likely be benefit from a motorized wheelchair or scooter. This release allow him to be more able to get out and about. He did move around the house more achy could actually get out the house.  I had a long discussion with him and his wife about his mobility and my feelings on that this may be a very good idea. I will defer to the heart failure clinic and his primary care doctor in order to look into potentially securing a motorized wheelchair/scooter.      Essential hypertension (Chronic)    This does not seem to be  an issue anymore. He is on high-dose of medications for afterload reduction and heart failure. Borderline hypotensive.      Relevant Orders   EKG 12-Lead   Coronary artery disease involving autologous vein coronary bypass graft with unspecified angina pectoris - Primary (Chronic)    He does have symptoms of angina, probably from his underlying microvascular disease and heart failure. He is on high-dose Ranexa as well as Toprol and Imdur. Despite this, he still has angina.  Not many interventional options. With his borderline renal function, would be reluctant to go to the Cath Lab unless he does have significant symptoms. His nuclear stress test did not show evidence of ischemia, therefore no objective data to  warrant catheterization.  His last 2 catheterizations revealed no PCI options.      Chronic combined systolic and diastolic heart failure, NYHA class 3 (HCC) (Chronic)    He is clearly a class III. I think he is best served being treated under the watchful care of Dr. Aundra Dubin. He is on standing dose of Demadex with when necessary dosing for weight change in symptoms change. He is also on spironolactone at low-dose (limited based on history of hyperkalemia). He is also on hydralazine for additional afterload reduction as he has not been replaced on a ACE inhibitor/ARB because of his baseline renal insufficiency.      Chronic anticoagulation (Chronic)    This patients CHA2DS2-VASc Score and unadjusted Ischemic Stroke Rate (% per year) is equal to 9.7 % stroke rate/year from a score of 6  Above score calculated as 1 point each if present [CHF, HTN, DM, Vascular=MI/PAD/Aortic Plaque, Age if 65-74, or Male] Above score calculated as 2 points each if present [Age > 75, or Stroke/TIA/TE]  - Anticoagulated with warfarin. Monitored here in clinic      Atrial fibrillation, permanent (HCC) (Chronic)    Rate seems to be reasonably well controlled on high-dose beta blocker. Unfortunately, he doesn't have a significant BiV pacing percentage. There had been discussion about AV nodal ablation, but Dr. Lovena Le has recommended against this for now based on the fact that he is not able to walk.  I doubt that we can get him out of atrial fibrillation given his history.        Relevant Orders   EKG 12-Lead      Current medicines are reviewed at length with the patient today. (+/- concerns) n/a The following changes have been made: n/a Studies Ordered:   Orders Placed This Encounter  Procedures  . EKG 12-Lead    I spent greater than 40 minutes reviewing the patient's chart, updating his medical history. Reviewing recent studies and clinic notes. An additional at least half an hour with the patient  and his wife in the room. Although I did not make any significant medical changes, the greater than 50% was counseling.   Continue to follow up with Dr. Aundra Dubin and Dr. Lovena Le as scheduled.  We'll continue yearly follow-up with Dr. Ellyn Hack.   Leonie Man, M.D., M.S. Interventional Cardiologist   Pager # [phone]

## 2015-12-21 ENCOUNTER — Encounter: Payer: Self-pay | Admitting: Cardiology

## 2015-12-21 NOTE — Assessment & Plan Note (Signed)
This does not seem to be an issue anymore. He is on high-dose of medications for afterload reduction and heart failure. Borderline hypotensive.

## 2015-12-21 NOTE — Assessment & Plan Note (Addendum)
This patients CHA2DS2-VASc Score and unadjusted Ischemic Stroke Rate (% per year) is equal to 9.7 % stroke rate/year from a score of 6  Above score calculated as 1 point each if present [CHF, HTN, DM, Vascular=MI/PAD/Aortic Plaque, Age if 65-74, or Male] Above score calculated as 2 points each if present [Age > 75, or Stroke/TIA/TE]  - Anticoagulated with warfarin. Monitored here in clinic

## 2015-12-21 NOTE — Assessment & Plan Note (Signed)
He does have symptoms of angina, probably from his underlying microvascular disease and heart failure. He is on high-dose Ranexa as well as Toprol and Imdur. Despite this, he still has angina.  Not many interventional options. With his borderline renal function, would be reluctant to go to the Cath Lab unless he does have significant symptoms. His nuclear stress test did not show evidence of ischemia, therefore no objective data to warrant catheterization.  His last 2 catheterizations revealed no PCI options.

## 2015-12-21 NOTE — Assessment & Plan Note (Signed)
Rate seems to be reasonably well controlled on high-dose beta blocker. Unfortunately, he doesn't have a significant BiV pacing percentage. There had been discussion about AV nodal ablation, but Dr. Ladona Ridgel has recommended against this for now based on the fact that he is not able to walk.  I doubt that we can get him out of atrial fibrillation given his history.

## 2015-12-21 NOTE — Assessment & Plan Note (Signed)
He is clearly a class III. I think he is best served being treated under the watchful care of Dr. [name]. He is on standing dose of Demadex with when necessary dosing for weight change in symptoms change. He is also on spironolactone at low-dose (limited based on history of hyperkalemia). He is also on hydralazine for additional afterload reduction as he has not been replaced on a ACE inhibitor/ARB because of his baseline renal insufficiency.

## 2015-12-21 NOTE — Assessment & Plan Note (Signed)
Chronic back and leg pain and weakness that has an extremely debilitated. This combined with his profound weakness and exertional dyspnea and angina make me think that he would likely be benefit from a motorized wheelchair or scooter. This release allow him to be more able to get out and about. He did move around the house more achy could actually get out the house.  I had a long discussion with him and his wife about his mobility and my feelings on that this may be a very good idea. I will defer to the heart failure clinic and his primary care doctor in order to look into potentially securing a motorized wheelchair/scooter.

## 2016-01-03 ENCOUNTER — Ambulatory Visit (INDEPENDENT_AMBULATORY_CARE_PROVIDER_SITE_OTHER): Payer: PPO | Admitting: Pharmacist Clinician (PhC)/ Clinical Pharmacy Specialist

## 2016-01-03 DIAGNOSIS — I4821 Permanent atrial fibrillation: Secondary | ICD-10-CM

## 2016-01-03 DIAGNOSIS — I482 Chronic atrial fibrillation: Secondary | ICD-10-CM

## 2016-01-03 DIAGNOSIS — Z7901 Long term (current) use of anticoagulants: Secondary | ICD-10-CM

## 2016-01-03 LAB — POCT INR: INR: 3

## 2016-01-09 ENCOUNTER — Other Ambulatory Visit (HOSPITAL_COMMUNITY): Payer: Self-pay | Admitting: Cardiology

## 2016-01-10 NOTE — Telephone Encounter (Signed)
Rx request sent to pharmacy.  

## 2016-01-23 ENCOUNTER — Ambulatory Visit (INDEPENDENT_AMBULATORY_CARE_PROVIDER_SITE_OTHER): Payer: PPO | Admitting: Pharmacist Clinician (PhC)/ Clinical Pharmacy Specialist

## 2016-01-23 ENCOUNTER — Ambulatory Visit (HOSPITAL_COMMUNITY)
Admission: RE | Admit: 2016-01-23 | Discharge: 2016-01-23 | Disposition: A | Discharge status: Home / Self Care | Payer: PPO | Source: Ambulatory Visit | Attending: Cardiology | Admitting: Cardiology

## 2016-01-23 VITALS — BP 124/78 | HR 98 | Ht 72.0 in | Wt 214.4 lb

## 2016-01-23 DIAGNOSIS — I25719 Atherosclerosis of autologous vein coronary artery bypass graft(s) with unspecified angina pectoris: Secondary | ICD-10-CM | POA: Diagnosis not present

## 2016-01-23 DIAGNOSIS — I2510 Atherosclerotic heart disease of native coronary artery without angina pectoris: Secondary | ICD-10-CM | POA: Insufficient documentation

## 2016-01-23 DIAGNOSIS — E039 Hypothyroidism, unspecified: Secondary | ICD-10-CM | POA: Diagnosis not present

## 2016-01-23 DIAGNOSIS — I482 Chronic atrial fibrillation: Secondary | ICD-10-CM

## 2016-01-23 DIAGNOSIS — Z95810 Presence of automatic (implantable) cardiac defibrillator: Secondary | ICD-10-CM | POA: Insufficient documentation

## 2016-01-23 DIAGNOSIS — Z794 Long term (current) use of insulin: Secondary | ICD-10-CM | POA: Insufficient documentation

## 2016-01-23 DIAGNOSIS — Z7901 Long term (current) use of anticoagulants: Secondary | ICD-10-CM

## 2016-01-23 DIAGNOSIS — E785 Hyperlipidemia, unspecified: Secondary | ICD-10-CM | POA: Insufficient documentation

## 2016-01-23 DIAGNOSIS — I255 Ischemic cardiomyopathy: Secondary | ICD-10-CM | POA: Diagnosis not present

## 2016-01-23 DIAGNOSIS — Z951 Presence of aortocoronary bypass graft: Secondary | ICD-10-CM | POA: Diagnosis not present

## 2016-01-23 DIAGNOSIS — I4821 Permanent atrial fibrillation: Secondary | ICD-10-CM

## 2016-01-23 DIAGNOSIS — Z7952 Long term (current) use of systemic steroids: Secondary | ICD-10-CM | POA: Diagnosis not present

## 2016-01-23 DIAGNOSIS — M069 Rheumatoid arthritis, unspecified: Secondary | ICD-10-CM | POA: Insufficient documentation

## 2016-01-23 DIAGNOSIS — I5042 Chronic combined systolic (congestive) and diastolic (congestive) heart failure: Secondary | ICD-10-CM

## 2016-01-23 DIAGNOSIS — I130 Hypertensive heart and chronic kidney disease with heart failure and stage 1 through stage 4 chronic kidney disease, or unspecified chronic kidney disease: Secondary | ICD-10-CM | POA: Diagnosis not present

## 2016-01-23 DIAGNOSIS — I5022 Chronic systolic (congestive) heart failure: Secondary | ICD-10-CM | POA: Insufficient documentation

## 2016-01-23 DIAGNOSIS — E1122 Type 2 diabetes mellitus with diabetic chronic kidney disease: Secondary | ICD-10-CM | POA: Insufficient documentation

## 2016-01-23 DIAGNOSIS — Z8249 Family history of ischemic heart disease and other diseases of the circulatory system: Secondary | ICD-10-CM | POA: Insufficient documentation

## 2016-01-23 DIAGNOSIS — I252 Old myocardial infarction: Secondary | ICD-10-CM | POA: Diagnosis not present

## 2016-01-23 DIAGNOSIS — Z79899 Other long term (current) drug therapy: Secondary | ICD-10-CM | POA: Diagnosis not present

## 2016-01-23 DIAGNOSIS — N183 Chronic kidney disease, stage 3 unspecified: Secondary | ICD-10-CM

## 2016-01-23 DIAGNOSIS — K219 Gastro-esophageal reflux disease without esophagitis: Secondary | ICD-10-CM | POA: Diagnosis not present

## 2016-01-23 DIAGNOSIS — R0600 Dyspnea, unspecified: Secondary | ICD-10-CM | POA: Insufficient documentation

## 2016-01-23 LAB — BASIC METABOLIC PANEL
Anion gap: 11 (ref 5–15)
BUN: 24 mg/dL — AB (ref 6–20)
CHLORIDE: 102 mmol/L (ref 101–111)
CO2: 24 mmol/L (ref 22–32)
CREATININE: 1.63 mg/dL — AB (ref 0.61–1.24)
Calcium: 9.7 mg/dL (ref 8.9–10.3)
GFR calc Af Amer: 46 mL/min — ABNORMAL LOW (ref >60)
GFR calc non Af Amer: 39 mL/min — ABNORMAL LOW (ref >60)
Glucose, Bld: 178 mg/dL — ABNORMAL HIGH (ref 65–99)
POTASSIUM: 5.3 mmol/L — AB (ref 3.5–5.1)
SODIUM: 137 mmol/L (ref 135–145)

## 2016-01-23 LAB — BRAIN NATRIURETIC PEPTIDE: B NATRIURETIC PEPTIDE 5: 759.4 pg/mL — AB (ref 0.0–100.0)

## 2016-01-23 LAB — POCT INR: INR: 5

## 2016-01-23 MED ORDER — HYDRALAZINE HCL 25 MG PO TABS: 37.5000 mg | ORAL_TABLET | Freq: Three times a day (TID) | ORAL | Status: AC

## 2016-01-23 MED ORDER — ISOSORBIDE MONONITRATE ER 60 MG PO TB24: 120.0000 mg | ORAL_TABLET | Freq: Every day | ORAL | Status: AC

## 2016-01-23 NOTE — Patient Instructions (Signed)
Increase Imdur to 120mg  (2 tablets) daily.  Increase Hydralazine to 37.5mg  (1 & 1/2 tablets) three times daily.  Follow up in  2 months with Dr.McLean

## 2016-01-24 ENCOUNTER — Encounter: Payer: PPO | Admitting: Pharmacist Clinician (PhC)/ Clinical Pharmacy Specialist

## 2016-01-24 NOTE — Progress Notes (Signed)
Patient ID: [name], male   DOB: 10/15/1941, 75 y.o.   MRN: [id number] PCP: Dr. Thayer Headings Primary Cardiologist: Dr. [name] HF Cardiologist: Dr. [name]  HPI: [name] is a 75 yo male with a history of HTN, DM2, HLD, CAD s/p CABG 1998, ischemic cardiomyopathy, chronic angina, chronic atrial fibrillation, LBBB, rheumatoid arthritis on chronic steroids, and severe spinal stenosis.  He has a chronically elevated left hemidiaphragm.  He has left hip pain with adductor muscle tears on prior MRI.   In 11/15 had prolonged hospitalization with cardiogenic shock requiring IABP and milrinone. Suspect occlusion of SVG-PDA prior to this hospitalization.Last admitted to [facility] in 12/15 with chest pain. Cardiac enzymes negative. Chest discomfort thought to be from upper respiratory infection and not heart failure. Discharge weight 199 pounds.    He had placement of [employer] Jude CRT-D system in 5/16.  He was started on ranolazine which helped with his angina.  He still has quite significant musculoskeletal disability.  He is walking only very short distances and has been like this for a long time. He has back pain and leg weakness/pain from spinal stenosis that are very limiting.  Weight is up by about 3 lbs. He is still short of breath with any walking (such as from bedroom to bathroom).  He has been sleeping in a recliner for 3 years.  He is only BiV pacing 40-50% of the time due to atrial fibrillation. He continues to have exertional chest pain despite increasing ranolazine to 1000 mg bid.  It will occur 2-3 times a week and resolves with rest. Cardiolite in 12/16 showed scar but no ischemia.  He continues on chronic prednisone for rheumatoid arthritis. Recently tried to wean off but had to restart.  Labs (11/15): K 5.3, creatinine 1.98, BNP 15184 => 5069, hemoglobin 13.6  Labs (1/16): BNP 4338 Labs (01/16/2015): K 4.0 Creatinine 1.36   Labs (5/16): K 5 => 4, creatinine 1.59 => 1.58, BNP 574 Labs (6/16): K  4.5, creatinine 1.6 Labs (8/16): BNP 356, K 4.3, creatinine 1.5 Labs (10/16): K 4.2, creatinine 1.69 => 1.88 Labs (12/16): K 4.5, creatinine 1.78, TGs 331, LDL 22, BNP 556  Studies: Echo (10/15/14): EF 25%, mod calcified annulus mitral valve, mod MR, RV sys fx mildly reduced LHC (10/16/14): patent LIMA-LAD and SVG to OM, TO SVG-PDA with TO PDA and 60% proximal RCA, some left =>PDA collaterals;  RHC (10/16/14): RA 20, RV 69/11, PA 69/36 (35), PCWP 35, PA sat: 46% on 100% NRB, Fick CO/CI: 3.15/1.43, Thermo CO/CI: 3.61/1.63 ECG (11/15): atrial fibrillation with LBBB, QRS 132 msec.  ECHO (01/2015): EF 25-30%, mild MR.  Lexiscan Cardiolite (12/16): Large inferolateral scar, no ischemia.   ROS: All systems negative except as listed in HPI, PMH and Problem List.  SH:  Social History   Social History  . Marital Status: Married    Spouse Name: N/A  . Number of Children: N/A  . Years of Education: N/A   Occupational History  . Not on file.   Social History Main Topics  . Smoking status: Never Smoker   . Smokeless tobacco: Never Used  . Alcohol Use: No  . Drug Use: No  . Sexual Activity: Not Currently   Other Topics Concern  . Not on file   Social History Narrative    FH:  Family History  Problem Relation Age of Onset  . Heart disease Mother   . Heart disease Father   . Heart disease Brother   . Heart  disease Maternal Aunt   . Cancer      Past Medical History  Diagnosis Date  . MI (myocardial infarction) (HCC)     "10/14/2014 I was told I'd had 2 MI's & didn't even know it" (11/27/2014)  . Coronary artery disease involving native coronary artery     a. CABG 1998. b. MYOVIEW 11/2015: Large, Severe FIXED Defect in Cx distribution +/- dLAD.  SCAR w/o Ishcemia.   . 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 NSTEMI 02/2013: Progression of dz in SVG-RPDA followed by severe stenosis in grafted vessel, not likely safe for PCI - med rx. b. NSTEMI 10/2014:  patent LIMA-LAD and SVG to OM, TO SVG-PDA with TO PDA and 60% proximal RCA, some left =>PDA collaterals; culprit was likely occlusion of SVG-PDA though this may have been subacute given some collaterals.  . Chronic chest pain   . Cardiomyopathy, ischemic 1998    s/p ICD; Intolerant of Ranexa.  . Chronic combined systolic and diastolic CHF, NYHA class 3 (HCC)     a. EF prev 25%, improved to 45-50% in 2014. b. 10/2014: EF back downt o 25%.  . Chronic atrial fibrillation (HCC)     Rate controlled - Now with ICD/PPM; On Coumadin  . Digitalis toxicity   . Hyperkalemia     a. Spironolactone d/c'd due to this.  [name] LBBB (left bundle branch block)   . Moderate mitral regurgitation by prior echocardiogram 10/2014 echo  . CKD (chronic kidney disease), stage III   . Hyperlipidemia LDL goal <70   . Moderate mitral regurgitation 10/2014 echo  . Type II diabetes mellitus (HCC)   . Rheumatoid arthritis (HCC)   . Long term current use of systemic steroids     for RA  . Hypothyroidism   . 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 Pleuritis, purulent (HCC)     h/o------ with negative vats biopsy; 1994  . 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 Spinal stenosis     Very limited mobility - almost wheelchair bound  . Hip pain     a. adductor muscle tears on prior MRI.  [name] GERD (gastroesophageal reflux disease)     Current Outpatient Prescriptions  Medication Sig Dispense Refill  . atorvastatin (LIPITOR) 40 MG tablet Take 40 mg by mouth daily.  0  . fluticasone (FLONASE) 50 MCG/ACT nasal spray Place 2 sprays into both nostrils daily.     . hydrALAZINE (APRESOLINE) 25 MG tablet Take 1.5 tablets (37.5 mg total) by mouth 3 (three) times daily. 135 tablet 3  . insulin lispro (HUMALOG) 100 UNIT/ML injection Inject 8 Units into the skin 2 (two) times daily before a meal.    . isosorbide  mononitrate (IMDUR) 60 MG 24 hr tablet Take 2 tablets (120 mg total) by mouth daily. 60 tablet 11  . levothyroxine (SYNTHROID, LEVOTHROID) 175 MCG tablet Take 175 mcg by mouth daily before breakfast.    . metoprolol succinate (TOPROL-XL) 100 MG 24 hr tablet TAKE 1 & 1/2 TABLETS BY MOUTH TWICE A DAY 260 tablet 2  . NITROSTAT 0.4 MG SL tablet PLACE 1 TABLET (0.4 MG TOTAL) UNDER THE TONGUE EVERY 5 (FIVE) MINUTES AS NEEDED FOR CHEST PAIN. 100 tablet 1  . nystatin cream (MYCOSTATIN) Apply 1 application topically daily as needed for dry skin.   4  .  Omega-3 Fatty Acids (FISH OIL) 1000 MG CAPS Take 3,000 mg by mouth every morning.    [name] omeprazole (PRILOSEC) 20 MG capsule Take 20 mg by mouth daily.    [name] oxyCODONE-acetaminophen (PERCOCET/ROXICET) 5-325 MG per tablet Take 1-2 tablets by mouth every 4 (four) hours as needed for severe pain. Reported on 12/03/2015    . polyethylene glycol (MIRALAX / GLYCOLAX) packet Take 17 g by mouth daily as needed. (Patient taking differently: Take 17 g by mouth daily as needed (CONSTIPATION). ) 14 each 0  . predniSONE (DELTASONE) 5 MG tablet Take 1 tablet by mouth 2 (two) times daily.  1  . ranolazine (RANEXA) 1000 MG SR tablet Take 1 tablet (1,000 mg total) by mouth 2 (two) times daily. 60 tablet 11  . spironolactone (ALDACTONE) 25 MG tablet TAKE 1/2 TABLET DAILY 15 tablet 3  . torsemide (DEMADEX) 20 MG tablet Take 2 tablets (40 mg total) by mouth daily. 60 tablet   . torsemide (DEMADEX) 20 MG tablet Take 1 tablet (20 mg total) by mouth daily. Take 20 mg every other day in the afternoon 15 tablet 11  . TOUJEO SOLOSTAR 300 UNIT/ML SOPN Inject 32 Units into the skin every morning.   4  . triamcinolone cream (KENALOG) 0.1 % Apply 1 application topically daily as needed (for rash).     . warfarin (COUMADIN) 5 MG tablet Take 1-2 tablets by mouth daily as directed (Patient taking differently: Take 7.5-10 mg by mouth daily at 6 PM. 5mg  on Friday and 7.5mg  all other days) 135  tablet 2   No current facility-administered medications for this encounter.    Filed Vitals:   01/23/16 1524  BP: 124/78  Pulse: 98  Height: 6' (1.829 m)  Weight: 214 lb 6.4 oz (97.251 kg)  SpO2: 99%    PHYSICAL EXAM: General:No resp difficulty, in wheelchair, wife and son present HEENT: normal Neck: supple. JVP 7. Carotids 2+ bilaterally; no bruits. No lymphadenopathy or thryomegaly appreciated. Cor: PMI normal. Heart irregular S1/S2, no S3/S4, 1/6 HSM at apex.  Lungs: Decreased breath sounds left base.   Abdomen: soft, nontender, nondistended. No hepatosplenomegaly. No bruits or masses. Good bowel sounds. Extremities: no cyanosis, clubbing, rash.  Trace ankle edema.  Neuro: alert & orientedx3, cranial nerves grossly intact. Moves all 4 extremities w/o difficulty. Affect pleasant.  ASSESSMENT & PLAN: 1) Chronic systolic heart failure: EF 25% (echo 01/2015), ischemic cardiomyopathy s/p CABG.  Patient was admitted to the hospital in 11/15 for acute/chronic systolic CHF with cardiogenic shock in the setting of NSTEMI. He had IABP placed and was diuresed with IV lasix and milrinone. He was weaned off milrinone.  He is very limited by orthopedic problems and does very little walking. NYHA class III symptoms, not just due to CHF (also orthopedic-related).  St Jude CRT-D system placed in 5/16 but he is only BiV pacing 40-50% of the time despite increasing Toprol XL.  He saw Dr 6/16, and it was decided not to do AV nodal ablation because of his poor functional status from spinal stenosis.  He does not look significantly volume overloaded on exam today. - Continue torsemide 40 mg daily followed by 20 every other day in the evening. BMET today. - Continue current Toprol XL 150 mg bid.  - Continue hydralazine/Imdur.   - Not on ACEI due to CKD. - Continue spironolactone.  - No digoxin due to history of digoxin toxicity.  2) CAD s/p CABG: Culprit for NSTEMI in 11/15 was likely occlusion of  SVG-PDA.  There was no good target for intervention. He continues to have occasional exertional chest pain despite maximum medical management.  Cardiolite, however, in 12/16 showed prior infarction but no ischemia.  - Continue Toprol XL and statin.  - Continue Ranexa 1000 mg bid and Imdur 120 mg daily.  - No ASA give warfarin use.  3) Dyspnea:  Probably due to combination of deconditioning, systolic CHF, and elevated left hemidiaphragm.  He does not have significant COPD, and he did not have interstitial lung disease on last chest CT.     4) Atrial fibrillation: Chronic. HR controlled, will continue Toprol and warfarin.  Check CBC today.  5) Immobility: Continue to try and be as active as possible.  Follow up in 2 months  Marca Ancona 01/24/2016

## 2016-01-31 ENCOUNTER — Ambulatory Visit (HOSPITAL_COMMUNITY)
Admission: RE | Admit: 2016-01-31 | Discharge: 2016-01-31 | Disposition: A | Discharge status: Home / Self Care | Payer: PPO | Source: Ambulatory Visit | Attending: Cardiology | Admitting: Cardiology

## 2016-01-31 DIAGNOSIS — I5023 Acute on chronic systolic (congestive) heart failure: Secondary | ICD-10-CM | POA: Diagnosis present

## 2016-02-04 ENCOUNTER — Ambulatory Visit (INDEPENDENT_AMBULATORY_CARE_PROVIDER_SITE_OTHER): Payer: PPO | Admitting: *Deleted

## 2016-02-04 ENCOUNTER — Ambulatory Visit (INDEPENDENT_AMBULATORY_CARE_PROVIDER_SITE_OTHER): Payer: PPO | Admitting: Pharmacist Clinician (PhC)/ Clinical Pharmacy Specialist

## 2016-02-04 DIAGNOSIS — I5022 Chronic systolic (congestive) heart failure: Secondary | ICD-10-CM

## 2016-02-04 DIAGNOSIS — I255 Ischemic cardiomyopathy: Secondary | ICD-10-CM

## 2016-02-04 DIAGNOSIS — Z7901 Long term (current) use of anticoagulants: Secondary | ICD-10-CM

## 2016-02-04 DIAGNOSIS — Z95810 Presence of automatic (implantable) cardiac defibrillator: Secondary | ICD-10-CM | POA: Diagnosis not present

## 2016-02-04 DIAGNOSIS — I4821 Permanent atrial fibrillation: Secondary | ICD-10-CM

## 2016-02-04 DIAGNOSIS — I482 Chronic atrial fibrillation: Secondary | ICD-10-CM

## 2016-02-04 LAB — POCT INR: INR: 2.8

## 2016-02-05 NOTE — Progress Notes (Signed)
Remote ICD transmission.   

## 2016-02-06 ENCOUNTER — Other Ambulatory Visit (HOSPITAL_COMMUNITY): Payer: Self-pay | Admitting: *Deleted

## 2016-02-06 MED ORDER — TORSEMIDE 20 MG PO TABS: ORAL_TABLET | ORAL | Status: DC

## 2016-02-11 ENCOUNTER — Encounter: Payer: Self-pay | Admitting: Internal Medicine

## 2016-02-18 ENCOUNTER — Other Ambulatory Visit: Payer: Self-pay | Admitting: Cardiovascular Disease

## 2016-02-18 NOTE — Telephone Encounter (Signed)
REFILL 

## 2016-02-24 ENCOUNTER — Encounter: Payer: PPO | Admitting: Pharmacist Clinician (PhC)/ Clinical Pharmacy Specialist

## 2016-02-24 ENCOUNTER — Other Ambulatory Visit (HOSPITAL_COMMUNITY): Payer: Self-pay | Admitting: *Deleted

## 2016-02-25 ENCOUNTER — Ambulatory Visit (INDEPENDENT_AMBULATORY_CARE_PROVIDER_SITE_OTHER): Payer: PPO | Admitting: Pharmacist Clinician (PhC)/ Clinical Pharmacy Specialist

## 2016-02-25 DIAGNOSIS — I4821 Permanent atrial fibrillation: Secondary | ICD-10-CM

## 2016-02-25 DIAGNOSIS — I482 Chronic atrial fibrillation: Secondary | ICD-10-CM | POA: Diagnosis not present

## 2016-02-25 DIAGNOSIS — Z7901 Long term (current) use of anticoagulants: Secondary | ICD-10-CM | POA: Diagnosis not present

## 2016-02-25 LAB — POCT INR: INR: 3.8

## 2016-02-27 ENCOUNTER — Encounter (HOSPITAL_COMMUNITY): Payer: Self-pay | Admitting: Nurse Practitioner

## 2016-02-27 ENCOUNTER — Encounter: Payer: Self-pay | Admitting: Cardiology

## 2016-02-27 ENCOUNTER — Emergency Department (HOSPITAL_COMMUNITY): Payer: PPO

## 2016-02-27 ENCOUNTER — Inpatient Hospital Stay (HOSPITAL_COMMUNITY)
Admission: EM | Admit: 2016-02-27 | Discharge: 2016-03-04 | DRG: 286 | Disposition: A | Discharge status: Home / Self Care | Payer: PPO | Attending: Cardiovascular Disease | Admitting: Cardiovascular Disease

## 2016-02-27 DIAGNOSIS — E1122 Type 2 diabetes mellitus with diabetic chronic kidney disease: Secondary | ICD-10-CM | POA: Diagnosis present

## 2016-02-27 DIAGNOSIS — I5023 Acute on chronic systolic (congestive) heart failure: Secondary | ICD-10-CM | POA: Diagnosis present

## 2016-02-27 DIAGNOSIS — J9610 Chronic respiratory failure, unspecified whether with hypoxia or hypercapnia: Secondary | ICD-10-CM | POA: Diagnosis not present

## 2016-02-27 DIAGNOSIS — I340 Nonrheumatic mitral (valve) insufficiency: Secondary | ICD-10-CM | POA: Diagnosis present

## 2016-02-27 DIAGNOSIS — E785 Hyperlipidemia, unspecified: Secondary | ICD-10-CM | POA: Diagnosis present

## 2016-02-27 DIAGNOSIS — Z9842 Cataract extraction status, left eye: Secondary | ICD-10-CM

## 2016-02-27 DIAGNOSIS — Z95810 Presence of automatic (implantable) cardiac defibrillator: Secondary | ICD-10-CM | POA: Diagnosis not present

## 2016-02-27 DIAGNOSIS — I252 Old myocardial infarction: Secondary | ICD-10-CM

## 2016-02-27 DIAGNOSIS — E039 Hypothyroidism, unspecified: Secondary | ICD-10-CM | POA: Diagnosis present

## 2016-02-27 DIAGNOSIS — E875 Hyperkalemia: Secondary | ICD-10-CM | POA: Diagnosis present

## 2016-02-27 DIAGNOSIS — I2582 Chronic total occlusion of coronary artery: Secondary | ICD-10-CM | POA: Diagnosis present

## 2016-02-27 DIAGNOSIS — Z7901 Long term (current) use of anticoagulants: Secondary | ICD-10-CM

## 2016-02-27 DIAGNOSIS — N183 Chronic kidney disease, stage 3 unspecified: Secondary | ICD-10-CM | POA: Diagnosis present

## 2016-02-27 DIAGNOSIS — M4800 Spinal stenosis, site unspecified: Secondary | ICD-10-CM | POA: Diagnosis present

## 2016-02-27 DIAGNOSIS — J449 Chronic obstructive pulmonary disease, unspecified: Secondary | ICD-10-CM | POA: Diagnosis present

## 2016-02-27 DIAGNOSIS — Z961 Presence of intraocular lens: Secondary | ICD-10-CM | POA: Diagnosis present

## 2016-02-27 DIAGNOSIS — I200 Unstable angina: Secondary | ICD-10-CM | POA: Diagnosis present

## 2016-02-27 DIAGNOSIS — I214 Non-ST elevation (NSTEMI) myocardial infarction: Secondary | ICD-10-CM | POA: Diagnosis not present

## 2016-02-27 DIAGNOSIS — Z888 Allergy status to other drugs, medicaments and biological substances status: Secondary | ICD-10-CM | POA: Diagnosis not present

## 2016-02-27 DIAGNOSIS — I25110 Atherosclerotic heart disease of native coronary artery with unstable angina pectoris: Secondary | ICD-10-CM | POA: Diagnosis not present

## 2016-02-27 DIAGNOSIS — Z96653 Presence of artificial knee joint, bilateral: Secondary | ICD-10-CM | POA: Diagnosis present

## 2016-02-27 DIAGNOSIS — I4510 Unspecified right bundle-branch block: Secondary | ICD-10-CM | POA: Diagnosis present

## 2016-02-27 DIAGNOSIS — I482 Chronic atrial fibrillation: Secondary | ICD-10-CM | POA: Diagnosis present

## 2016-02-27 DIAGNOSIS — K219 Gastro-esophageal reflux disease without esophagitis: Secondary | ICD-10-CM | POA: Diagnosis present

## 2016-02-27 DIAGNOSIS — Z7952 Long term (current) use of systemic steroids: Secondary | ICD-10-CM

## 2016-02-27 DIAGNOSIS — Z951 Presence of aortocoronary bypass graft: Secondary | ICD-10-CM | POA: Diagnosis not present

## 2016-02-27 DIAGNOSIS — Z9841 Cataract extraction status, right eye: Secondary | ICD-10-CM

## 2016-02-27 DIAGNOSIS — I25700 Atherosclerosis of coronary artery bypass graft(s), unspecified, with unstable angina pectoris: Secondary | ICD-10-CM

## 2016-02-27 DIAGNOSIS — I255 Ischemic cardiomyopathy: Secondary | ICD-10-CM | POA: Diagnosis present

## 2016-02-27 DIAGNOSIS — Z887 Allergy status to serum and vaccine status: Secondary | ICD-10-CM | POA: Diagnosis not present

## 2016-02-27 DIAGNOSIS — M069 Rheumatoid arthritis, unspecified: Secondary | ICD-10-CM | POA: Diagnosis present

## 2016-02-27 DIAGNOSIS — R079 Chest pain, unspecified: Secondary | ICD-10-CM | POA: Diagnosis present

## 2016-02-27 DIAGNOSIS — I130 Hypertensive heart and chronic kidney disease with heart failure and stage 1 through stage 4 chronic kidney disease, or unspecified chronic kidney disease: Secondary | ICD-10-CM | POA: Diagnosis present

## 2016-02-27 DIAGNOSIS — I2510 Atherosclerotic heart disease of native coronary artery without angina pectoris: Secondary | ICD-10-CM

## 2016-02-27 DIAGNOSIS — I501 Left ventricular failure: Secondary | ICD-10-CM | POA: Diagnosis not present

## 2016-02-27 DIAGNOSIS — I5022 Chronic systolic (congestive) heart failure: Secondary | ICD-10-CM | POA: Diagnosis not present

## 2016-02-27 DIAGNOSIS — N179 Acute kidney failure, unspecified: Secondary | ICD-10-CM | POA: Diagnosis present

## 2016-02-27 DIAGNOSIS — I4821 Permanent atrial fibrillation: Secondary | ICD-10-CM | POA: Diagnosis present

## 2016-02-27 DIAGNOSIS — Z79899 Other long term (current) drug therapy: Secondary | ICD-10-CM | POA: Diagnosis not present

## 2016-02-27 DIAGNOSIS — Z794 Long term (current) use of insulin: Secondary | ICD-10-CM | POA: Diagnosis not present

## 2016-02-27 LAB — CBC
HCT: 43.7 % (ref 39.0–52.0)
HEMOGLOBIN: 14.5 g/dL (ref 13.0–17.0)
MCH: 33.3 pg (ref 26.0–34.0)
MCHC: 33.2 g/dL (ref 30.0–36.0)
MCV: 100.2 fL — AB (ref 78.0–100.0)
PLATELETS: 190 10*3/uL (ref 150–400)
RBC: 4.36 MIL/uL (ref 4.22–5.81)
RDW: 15.3 % (ref 11.5–15.5)
WBC: 14.7 10*3/uL — AB (ref 4.0–10.5)

## 2016-02-27 LAB — BRAIN NATRIURETIC PEPTIDE: B Natriuretic Peptide: 1124.9 pg/mL — ABNORMAL HIGH (ref 0.0–100.0)

## 2016-02-27 LAB — GLUCOSE, CAPILLARY
GLUCOSE-CAPILLARY: 156 mg/dL — AB (ref 65–99)
Glucose-Capillary: 231 mg/dL — ABNORMAL HIGH (ref 65–99)

## 2016-02-27 LAB — BASIC METABOLIC PANEL
ANION GAP: 18 — AB (ref 5–15)
BUN: 27 mg/dL — AB (ref 6–20)
CHLORIDE: 99 mmol/L — AB (ref 101–111)
CO2: 18 mmol/L — ABNORMAL LOW (ref 22–32)
Calcium: 9.3 mg/dL (ref 8.9–10.3)
Creatinine, Ser: 1.99 mg/dL — ABNORMAL HIGH (ref 0.61–1.24)
GFR calc Af Amer: 36 mL/min — ABNORMAL LOW (ref >60)
GFR, EST NON AFRICAN AMERICAN: 31 mL/min — AB (ref >60)
GLUCOSE: 233 mg/dL — AB (ref 65–99)
POTASSIUM: 5.7 mmol/L — AB (ref 3.5–5.1)
Sodium: 135 mmol/L (ref 135–145)

## 2016-02-27 LAB — I-STAT TROPONIN, ED: TROPONIN I, POC: 0.06 ng/mL (ref 0.00–0.08)

## 2016-02-27 LAB — TROPONIN I: Troponin I: 0.05 ng/mL — ABNORMAL HIGH (ref <0.031)

## 2016-02-27 LAB — PROTIME-INR
INR: 2.37 — ABNORMAL HIGH (ref 0.00–1.49)
Prothrombin Time: 25.6 seconds — ABNORMAL HIGH (ref 11.6–15.2)

## 2016-02-27 MED ORDER — ATORVASTATIN CALCIUM 40 MG PO TABS
40.0000 mg | ORAL_TABLET | Freq: Every day | ORAL | Status: DC
Start: 2016-02-28 — End: 2016-03-04
  Administered 2016-02-28 – 2016-03-04 (×6): 40 mg via ORAL
  Filled 2016-02-27 (×6): qty 1

## 2016-02-27 MED ORDER — OXYCODONE-ACETAMINOPHEN 5-325 MG PO TABS
1.0000 | ORAL_TABLET | ORAL | Status: DC | PRN
Start: 2016-02-27 — End: 2016-03-04

## 2016-02-27 MED ORDER — RANOLAZINE ER 500 MG PO TB12
1000.0000 mg | ORAL_TABLET | Freq: Two times a day (BID) | ORAL | Status: DC
  Administered 2016-02-27 – 2016-03-04 (×12): 1000 mg via ORAL
  Filled 2016-02-27 (×12): qty 2

## 2016-02-27 MED ORDER — PANTOPRAZOLE SODIUM 40 MG PO TBEC
40.0000 mg | DELAYED_RELEASE_TABLET | Freq: Every day | ORAL | Status: DC
  Administered 2016-02-28 – 2016-03-04 (×6): 40 mg via ORAL
  Filled 2016-02-27 (×6): qty 1

## 2016-02-27 MED ORDER — SODIUM CHLORIDE 0.9% FLUSH
3.0000 mL | Freq: Two times a day (BID) | INTRAVENOUS | Status: DC
  Administered 2016-02-27 – 2016-02-29 (×3): 3 mL via INTRAVENOUS

## 2016-02-27 MED ORDER — SPIRONOLACTONE 25 MG PO TABS
12.5000 mg | ORAL_TABLET | Freq: Every day | ORAL | Status: DC
  Administered 2016-02-28 – 2016-03-01 (×3): 12.5 mg via ORAL
  Filled 2016-02-27 (×3): qty 1

## 2016-02-27 MED ORDER — ACETAMINOPHEN 325 MG PO TABS: 650.0000 mg | ORAL_TABLET | ORAL | Status: DC | PRN

## 2016-02-27 MED ORDER — INSULIN LISPRO 100 UNIT/ML ~~LOC~~ SOLN: 10.0000 [IU] | Freq: Two times a day (BID) | SUBCUTANEOUS | Status: DC

## 2016-02-27 MED ORDER — TORSEMIDE 20 MG PO TABS
40.0000 mg | ORAL_TABLET | Freq: Every day | ORAL | Status: DC
  Administered 2016-02-28 – 2016-02-29 (×2): 40 mg via ORAL
  Filled 2016-02-27 (×3): qty 2

## 2016-02-27 MED ORDER — FLUTICASONE PROPIONATE 50 MCG/ACT NA SUSP
2.0000 | Freq: Every day | NASAL | Status: DC
  Administered 2016-02-28 – 2016-03-04 (×5): 2 via NASAL
  Filled 2016-02-27: qty 16

## 2016-02-27 MED ORDER — POLYETHYLENE GLYCOL 3350 17 G PO PACK
17.0000 g | PACK | Freq: Every day | ORAL | Status: DC | PRN
  Administered 2016-02-29: 17 g via ORAL
  Filled 2016-02-27: qty 1

## 2016-02-27 MED ORDER — ONDANSETRON HCL 4 MG/2ML IJ SOLN: 4.0000 mg | Freq: Four times a day (QID) | INTRAMUSCULAR | Status: DC | PRN

## 2016-02-27 MED ORDER — HYDRALAZINE HCL 25 MG PO TABS
37.5000 mg | ORAL_TABLET | Freq: Three times a day (TID) | ORAL | Status: DC
  Administered 2016-02-27 – 2016-03-04 (×18): 37.5 mg via ORAL
  Filled 2016-02-27 (×18): qty 2

## 2016-02-27 MED ORDER — SODIUM CHLORIDE 0.9 % IV SOLN: 250.0000 mL | INTRAVENOUS | Status: DC | PRN

## 2016-02-27 MED ORDER — SODIUM CHLORIDE 0.9% FLUSH: 3.0000 mL | INTRAVENOUS | Status: DC | PRN

## 2016-02-27 MED ORDER — NITROGLYCERIN 0.4 MG SL SUBL: 0.4000 mg | SUBLINGUAL_TABLET | SUBLINGUAL | Status: DC | PRN

## 2016-02-27 MED ORDER — INSULIN GLARGINE 100 UNIT/ML ~~LOC~~ SOLN
32.0000 [IU] | Freq: Every morning | SUBCUTANEOUS | Status: DC
  Administered 2016-02-28 – 2016-03-04 (×6): 32 [IU] via SUBCUTANEOUS
  Filled 2016-02-27 (×6): qty 0.32

## 2016-02-27 MED ORDER — LEVOTHYROXINE SODIUM 75 MCG PO TABS
175.0000 ug | ORAL_TABLET | Freq: Every day | ORAL | Status: DC
  Administered 2016-02-28 – 2016-03-04 (×6): 175 ug via ORAL
  Filled 2016-02-27 (×6): qty 1

## 2016-02-27 MED ORDER — SODIUM CHLORIDE 0.9 % WEIGHT BASED INFUSION
97.3000 mL/h | INTRAVENOUS | Status: DC
  Administered 2016-03-02: 97.3 mL/h via INTRAVENOUS

## 2016-02-27 MED ORDER — PREDNISONE 5 MG PO TABS
5.0000 mg | ORAL_TABLET | Freq: Two times a day (BID) | ORAL | Status: DC
  Administered 2016-02-27 – 2016-03-04 (×12): 5 mg via ORAL
  Filled 2016-02-27 (×12): qty 1

## 2016-02-27 MED ORDER — METOPROLOL SUCCINATE ER 50 MG PO TB24
150.0000 mg | ORAL_TABLET | Freq: Two times a day (BID) | ORAL | Status: DC
  Administered 2016-02-27 – 2016-03-04 (×12): 150 mg via ORAL
  Filled 2016-02-27 (×12): qty 1

## 2016-02-27 MED ORDER — INSULIN ASPART 100 UNIT/ML ~~LOC~~ SOLN
10.0000 [IU] | Freq: Two times a day (BID) | SUBCUTANEOUS | Status: DC
  Administered 2016-02-28 – 2016-03-04 (×10): 10 [IU] via SUBCUTANEOUS

## 2016-02-27 MED ORDER — ISOSORBIDE MONONITRATE ER 60 MG PO TB24
120.0000 mg | ORAL_TABLET | Freq: Every day | ORAL | Status: DC
Start: 2016-02-28 — End: 2016-03-04
  Administered 2016-02-28 – 2016-03-04 (×6): 120 mg via ORAL
  Filled 2016-02-27 (×6): qty 2

## 2016-02-27 MED ORDER — ASPIRIN EC 81 MG PO TBEC
81.0000 mg | DELAYED_RELEASE_TABLET | Freq: Every day | ORAL | Status: DC
  Administered 2016-02-28 – 2016-03-02 (×4): 81 mg via ORAL
  Filled 2016-02-27 (×4): qty 1

## 2016-02-27 MED ORDER — ASPIRIN 81 MG PO CHEW: 81.0000 mg | CHEWABLE_TABLET | ORAL | Status: AC

## 2016-02-27 NOTE — ED Notes (Signed)
Cephus Slater, RN to call back for report

## 2016-02-27 NOTE — ED Notes (Signed)
MD at bedside. 

## 2016-02-27 NOTE — ED Provider Notes (Signed)
CSN: [id number]     Arrival date & time 02/27/16  1324 History   First MD Initiated Contact with Patient 02/27/16 1342     Chief Complaint  Patient presents with  . Chest Pain     (Consider location/radiation/quality/duration/timing/severity/associated sxs/prior Treatment) HPI Patient reports that he has been having ongoing chest pain. He states it has been getting worse. He reports that he has been "eating nitroglycerin like a chicken eats corn". He gets very temporary relief but with any exertion it returns. He reports the chest pain is in his left upper chest and is a pressure and a tightness. His next cardiologist appointment is in approximately a month. He reports the pain got to such a level that he could not wait to go to the appointment. He also reports she's felt short of breath with some nausea and lightheadedness for the past 2 days. No syncopal episode. Patient has an extensive cardiac history with chronic atrial fibrillation. Past Medical History  Diagnosis Date  . MI (myocardial infarction) (HCC)     "10/14/2014 I was told I'd had 2 MI's & didn't even know it" (11/27/2014)  . Coronary artery disease involving native coronary artery     a. CABG 1998. b. MYOVIEW 11/2015: Large, Severe FIXED Defect in Cx distribution +/- dLAD.  SCAR w/o Ishcemia.   . 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 NSTEMI 02/2013: Progression of dz in SVG-RPDA followed by severe stenosis in grafted vessel, not likely safe for PCI - med rx. b. NSTEMI 10/2014: patent LIMA-LAD and SVG to OM, TO SVG-PDA with TO PDA and 60% proximal RCA, some left =>PDA collaterals; culprit was likely occlusion of SVG-PDA though this may have been subacute given some collaterals.  . Chronic chest pain   . Cardiomyopathy, ischemic 1998    s/p ICD; Intolerant of Ranexa.  . Chronic combined systolic and diastolic CHF, NYHA class 3 (HCC)     a. EF prev 25%, improved to 45-50% in 2014. b. 10/2014: EF back  downt o 25%.  . Chronic atrial fibrillation (HCC)     Rate controlled - Now with ICD/PPM; On Coumadin  . Digitalis toxicity   . Hyperkalemia     a. Spironolactone d/c'd due to this.  [name] LBBB (left bundle branch block)   . Moderate mitral regurgitation by prior echocardiogram 10/2014 echo  . CKD (chronic kidney disease), stage III   . Hyperlipidemia LDL goal <70   . Moderate mitral regurgitation 10/2014 echo  . Type II diabetes mellitus (HCC)   . Rheumatoid arthritis (HCC)   . Long term current use of systemic steroids     for RA  . Hypothyroidism   . 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 Pleuritis, purulent (HCC)     h/o------ with negative vats biopsy; 1994  . 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 Spinal stenosis     Very limited mobility - almost wheelchair bound  . Hip pain     a. adductor muscle tears on prior MRI.  [name] GERD (gastroesophageal reflux disease)    Past Surgical History  Procedure Laterality Date  . Lung surgery  1994    "infection in lungs; scraped it out on right; 6 wks later they did left; clipped left diaphragm & paralyzed it"  . Total knee arthroplasty Bilateral 2000's  .  Carpal tunnel release Bilateral 1990's  . Cataract extraction w/ intraocular lens  implant, bilateral Bilateral 1990's  . Cardiac catheterization Left 01/11/2013    Severe CAD with notable progression of disease in the SVG-RPDA followed by severe stenosis in grafted vessel, region not safely "reachede" for PCI, moderate ischemic CM no notable chage in EF, continue medical therapy  . Cardiac catheterization  04/03/2011    Left circumflex 95% stenosis, essentially unchanged from last cath  . Cardiac catheterization  04/01/2010    LAD 99% proximal stenosis with vague flow, circumflex 80% stenosis, essentially unchanged form last cath  . Cardiac catheterization  03/28/2010     Mild pulmonary hypertension, medical therapy  . Cardiac catheterization  04/30/2009    LAD 95% stenosis, essentially unchanged fomr last cath, medical therapy  . Cardiac catheterization  12/05/2008    2.5x12 apex, predilation in the distal PLA branch, 3x23 XIENCE V durg eluting stent and post dilated with a 3.25x20 [place] VOYAGER at 15 atmospheres  . Left heart catheterization with coronary/graft angiogram N/A 01/13/2013    Procedure: LEFT HEART CATHETERIZATION WITH Isabel Caprice;  Surgeon: Marykay Lex, MD;  Location: [facility] CATH LAB;  Service: Cardiovascular;  Laterality: N/A;  . Left and right heart catheterization with coronary/graft angiogram N/A 10/16/2014    Procedure: LEFT AND RIGHT HEART CATHETERIZATION WITH Isabel Caprice;  Surgeon: Marykay Lex, MD;  Location: [facility] CATH LAB;  Service: Cardiovascular;  Laterality: N/A;  . Coronary artery bypass graft  05/1997    "CABG X7"  . Patella fracture surgery Right "before replacement"  . Knee arthroscopy Left "before replacement  . Ep implantable device N/A 05/06/2015    Procedure: BiV ICD Insertion CRT-D;  Surgeon: Marinus Maw, MD;  Location: [facility] INVASIVE CV LAB;  Service: Cardiovascular;  Laterality: N/A;  . Nm myoview ltd  11/2015     Large, Severe FIXED Defect in Cx distribution +/- dLAD.  SCAR w/o Ishcemia.   . Cardiac catheterization N/A 03/02/2016    Procedure: Left Heart Cath and Cors/Grafts Angiography;  Surgeon: Runell Gess, MD;  Location: [facility] INVASIVE CV LAB;  Service: Cardiovascular;  Laterality: N/A;   Family History  Problem Relation Age of Onset  . Heart disease Mother   . Heart disease Father   . Heart disease Brother   . Heart disease Maternal Aunt   . Cancer     Social History  Substance Use Topics  . Smoking status: Never Smoker   . Smokeless tobacco: Never Used  . Alcohol Use: No    Review of Systems  10 Systems reviewed and are negative for acute change except as noted in the  HPI.   Allergies  Butrans; Influenza vaccines; Lanoxin; Lisinopril; Clarithromycin; and Statins  Home Medications   Prior to Admission medications   Medication Sig Start Date End Date Taking? Authorizing Provider  atorvastatin (LIPITOR) 40 MG tablet Take 40 mg by mouth daily. 09/09/15  Yes Historical Provider, MD  fluticasone (FLONASE) 50 MCG/ACT nasal spray Place 2 sprays into both nostrils daily.    Yes Historical Provider, MD  hydrALAZINE (APRESOLINE) 25 MG tablet Take 1.5 tablets (37.5 mg total) by mouth 3 (three) times daily. 01/23/16  Yes Laurey Morale, MD  insulin lispro (HUMALOG) 100 UNIT/ML injection Inject 10 Units into the skin 2 (two) times daily before a meal.    Yes Historical Provider, MD  isosorbide mononitrate (IMDUR) 60 MG 24 hr tablet Take 2 tablets (120 mg total) by mouth daily. 01/23/16  Yes Laurey Morale, MD  levothyroxine (SYNTHROID, LEVOTHROID) 175 MCG tablet Take 175 mcg by mouth daily before breakfast.   Yes Historical Provider, MD  metoprolol succinate (TOPROL-XL) 100 MG 24 hr tablet TAKE 1 & 1/2 TABLETS BY MOUTH TWICE A DAY 11/26/15  Yes Amber Caryl Bis, NP  nitroGLYCERIN (NITROSTAT) 0.4 MG SL tablet Place 1 tablet (0.4 mg total) under the tongue every 5 (five) minutes as needed for chest pain. KEEP OV. 02/18/16  Yes Runell Gess, MD  nystatin cream (MYCOSTATIN) Apply 1 application topically daily as needed for dry skin.  03/19/15  Yes Historical Provider, MD  Omega-3 Fatty Acids (FISH OIL) 1000 MG CAPS Take 3,000 mg by mouth every morning.   Yes Historical Provider, MD  omeprazole (PRILOSEC) 20 MG capsule Take 20 mg by mouth daily.   Yes Historical Provider, MD  oxyCODONE-acetaminophen (PERCOCET/ROXICET) 5-325 MG per tablet Take 1-2 tablets by mouth every 4 (four) hours as needed for severe pain. Reported on 12/03/2015   Yes Historical Provider, MD  predniSONE (DELTASONE) 5 MG tablet Take 1 tablet by mouth 2 (two) times daily. 06/04/15  Yes Historical Provider, MD   ranolazine (RANEXA) 1000 MG SR tablet Take 1 tablet (1,000 mg total) by mouth 2 (two) times daily. 09/19/15  Yes Laurey Morale, MD  spironolactone (ALDACTONE) 25 MG tablet TAKE 1/2 TABLET DAILY 11/01/15  Yes Laurey Morale, MD  torsemide (DEMADEX) 20 MG tablet Take 2 tablets daily and take 1 extra tablet every other day. 02/06/16  Yes Bevelyn Buckles Bensimhon, MD  TOUJEO SOLOSTAR 300 UNIT/ML SOPN Inject 32 Units into the skin every morning.  02/11/15  Yes Historical Provider, MD  triamcinolone cream (KENALOG) 0.1 % Apply 1 application topically daily as needed (for rash).  02/20/13  Yes Historical Provider, MD  warfarin (COUMADIN) 5 MG tablet Take 1-2 tablets by mouth daily as directed Patient taking differently: Take 5-7.5 mg by mouth daily at 6 PM. Take 7.5 mg on Sunday, Tuesday, and Thursday all other days (monday, Wednesday, Friday, Saturday) take . 10/10/14  Yes Kristin L Alvstad, RPH-CPP  polyethylene glycol (MIRALAX / GLYCOLAX) packet Take 17 g by mouth daily as needed. Patient taking differently: Take 17 g by mouth daily as needed (CONSTIPATION).  11/30/14   Rhonda G Barrett, PA-C   BP 113/91 mmHg  Pulse 76  Temp(Src) 98.1 F (36.7 C) (Oral)  Resp 18  Ht 6' (1.829 m)  Wt 206 lb (93.441 kg)  BMI 27.93 kg/m2  SpO2 98% Physical Exam  Constitutional: He is oriented to person, place, and time. He appears well-developed and well-nourished.  HENT:  Head: Normocephalic and atraumatic.  Eyes: EOM are normal. Pupils are equal, round, and reactive to light.  Neck: Neck supple.  Cardiovascular: Regular rhythm, normal heart sounds and intact distal pulses.   Irregularly irregular.  Pulmonary/Chest: Effort normal and breath sounds normal.  Abdominal: Soft. Bowel sounds are normal. He exhibits no distension. There is no tenderness.  Musculoskeletal: Normal range of motion. He exhibits edema.  2+ edema bilateral lower extremities.  Neurological: He is alert and oriented to person, place, and  time. He has normal strength. Coordination normal. GCS eye subscore is 4. GCS verbal subscore is 5. GCS motor subscore is 6.  Skin: Skin is warm, dry and intact.  Psychiatric: He has a normal mood and affect.    ED Course  Procedures (including critical care time) Labs Review Labs Reviewed  BASIC METABOLIC PANEL - Abnormal; Notable for the  following:    Potassium 5.7 (*)    Chloride 99 (*)    CO2 18 (*)    Glucose, Bld 233 (*)    BUN 27 (*)    Creatinine, Ser 1.99 (*)    GFR calc non Af Amer 31 (*)    GFR calc Af Amer 36 (*)    Anion gap 18 (*)    All other components within normal limits  CBC - Abnormal; Notable for the following:    WBC 14.7 (*)    MCV 100.2 (*)    All other components within normal limits  PROTIME-INR - Abnormal; Notable for the following:    Prothrombin Time 25.6 (*)    INR 2.37 (*)    All other components within normal limits  BRAIN NATRIURETIC PEPTIDE - Abnormal; Notable for the following:    B Natriuretic Peptide 1124.9 (*)    All other components within normal limits  TROPONIN I - Abnormal; Notable for the following:    Troponin I 0.05 (*)    All other components within normal limits  TROPONIN I - Abnormal; Notable for the following:    Troponin I 0.05 (*)    All other components within normal limits  TROPONIN I - Abnormal; Notable for the following:    Troponin I 0.04 (*)    All other components within normal limits  BASIC METABOLIC PANEL - Abnormal; Notable for the following:    Sodium 133 (*)    Chloride 98 (*)    Glucose, Bld 130 (*)    BUN 27 (*)    Creatinine, Ser 1.64 (*)    GFR calc non Af Amer 39 (*)    GFR calc Af Amer 45 (*)    All other components within normal limits  LIPID PANEL - Abnormal; Notable for the following:    HDL 31 (*)    All other components within normal limits  PROTIME-INR - Abnormal; Notable for the following:    Prothrombin Time 19.7 (*)    INR 1.67 (*)    All other components within normal limits   GLUCOSE, CAPILLARY - Abnormal; Notable for the following:    Glucose-Capillary 156 (*)    All other components within normal limits  GLUCOSE, CAPILLARY - Abnormal; Notable for the following:    Glucose-Capillary 231 (*)    All other components within normal limits  GLUCOSE, CAPILLARY - Abnormal; Notable for the following:    Glucose-Capillary 135 (*)    All other components within normal limits  HEPARIN LEVEL (UNFRACTIONATED) - Abnormal; Notable for the following:    Heparin Unfractionated 0.27 (*)    All other components within normal limits  GLUCOSE, CAPILLARY - Abnormal; Notable for the following:    Glucose-Capillary 280 (*)    All other components within normal limits  CBC - Abnormal; Notable for the following:    RBC 3.83 (*)    Hemoglobin 12.2 (*)    HCT 37.9 (*)    All other components within normal limits  GLUCOSE, CAPILLARY - Abnormal; Notable for the following:    Glucose-Capillary 171 (*)    All other components within normal limits  PROTIME-INR - Abnormal; Notable for the following:    Prothrombin Time 17.9 (*)    All other components within normal limits  GLUCOSE, CAPILLARY - Abnormal; Notable for the following:    Glucose-Capillary 164 (*)    All other components within normal limits  GLUCOSE, CAPILLARY - Abnormal; Notable for the following:  Glucose-Capillary 128 (*)    All other components within normal limits  GLUCOSE, CAPILLARY - Abnormal; Notable for the following:    Glucose-Capillary 202 (*)    All other components within normal limits  CBC - Abnormal; Notable for the following:    WBC 10.8 (*)    RBC 3.93 (*)    Hemoglobin 12.7 (*)    HCT 38.8 (*)    All other components within normal limits  BASIC METABOLIC PANEL - Abnormal; Notable for the following:    Sodium 134 (*)    Chloride 96 (*)    Glucose, Bld 168 (*)    BUN 27 (*)    Creatinine, Ser 1.80 (*)    GFR calc non Af Amer 35 (*)    GFR calc Af Amer 40 (*)    All other components within  normal limits  GLUCOSE, CAPILLARY - Abnormal; Notable for the following:    Glucose-Capillary 219 (*)    All other components within normal limits  PROTIME-INR - Abnormal; Notable for the following:    Prothrombin Time 16.5 (*)    All other components within normal limits  GLUCOSE, CAPILLARY - Abnormal; Notable for the following:    Glucose-Capillary 129 (*)    All other components within normal limits  GLUCOSE, CAPILLARY - Abnormal; Notable for the following:    Glucose-Capillary 140 (*)    All other components within normal limits  GLUCOSE, CAPILLARY - Abnormal; Notable for the following:    Glucose-Capillary 214 (*)    All other components within normal limits  GLUCOSE, CAPILLARY - Abnormal; Notable for the following:    Glucose-Capillary 155 (*)    All other components within normal limits  CBC - Abnormal; Notable for the following:    WBC 11.8 (*)    RBC 3.89 (*)    Hemoglobin 12.5 (*)    HCT 38.8 (*)    All other components within normal limits  GLUCOSE, CAPILLARY - Abnormal; Notable for the following:    Glucose-Capillary 179 (*)    All other components within normal limits  PROTIME-INR - Abnormal; Notable for the following:    Prothrombin Time 16.6 (*)    All other components within normal limits  BASIC METABOLIC PANEL - Abnormal; Notable for the following:    Sodium 134 (*)    Glucose, Bld 140 (*)    BUN 23 (*)    Creatinine, Ser 1.52 (*)    GFR calc non Af Amer 43 (*)    GFR calc Af Amer 50 (*)    All other components within normal limits  GLUCOSE, CAPILLARY - Abnormal; Notable for the following:    Glucose-Capillary 105 (*)    All other components within normal limits  HEPARIN LEVEL (UNFRACTIONATED) - Abnormal; Notable for the following:    Heparin Unfractionated 0.22 (*)    All other components within normal limits  BASIC METABOLIC PANEL - Abnormal; Notable for the following:    Potassium 5.4 (*)    Glucose, Bld 128 (*)    BUN 24 (*)    Creatinine, Ser  1.40 (*)    GFR calc non Af Amer 47 (*)    GFR calc Af Amer 55 (*)    All other components within normal limits  CBC - Abnormal; Notable for the following:    WBC 12.0 (*)    RBC 4.13 (*)    All other components within normal limits  GLUCOSE, CAPILLARY - Abnormal; Notable for the following:  Glucose-Capillary 124 (*)    All other components within normal limits  GLUCOSE, CAPILLARY - Abnormal; Notable for the following:    Glucose-Capillary 121 (*)    All other components within normal limits  PROTIME-INR - Abnormal; Notable for the following:    Prothrombin Time 15.3 (*)    All other components within normal limits  GLUCOSE, CAPILLARY - Abnormal; Notable for the following:    Glucose-Capillary 114 (*)    All other components within normal limits  HEPARIN LEVEL (UNFRACTIONATED)  HEPARIN LEVEL (UNFRACTIONATED)  HEPARIN LEVEL (UNFRACTIONATED)  HEPARIN LEVEL (UNFRACTIONATED)  HEPARIN LEVEL (UNFRACTIONATED)  I-STAT TROPOININ, ED    Imaging Review No results found. I have personally reviewed and evaluated these images and lab results as part of my medical decision-making.   EKG Interpretation   Date/Time:  Thursday February 27 2016 14:45:20 EDT Ventricular Rate:  113 PR Interval:    QRS Duration: 150 QT Interval:  382 QTC Calculation: 524 R Axis:   -43 Text Interpretation:  Atrial fibrillation RBBB and LAFB Probable lateral  infarct, age indeterminate ED PHYSICIAN INTERPRETATION AVAILABLE IN [facility] Confirmed by TEST, Record ([id number]) on 02/28/2016 7:49:05 AM     Consult: Cardiology 15:04 to see in ED.  MDM   Final diagnoses:  Unstable angina (HCC)  Coronary artery disease involving coronary bypass graft of native heart with unstable angina pectoris [facility])   Patient with known coronary artery disease and history of bypass graft. Also complicated by chronic atrial fibrillation on Coumadin. She has had significantly increasing chest pain frequency and severity. He  reports significant increasing use of nitroglycerin. Cardiology was consulted to evaluate the patient for admission and ongoing treatment.    Arby Barrette, MD 03/03/16 [phone]

## 2016-02-27 NOTE — ED Notes (Signed)
Lauren, RN accepts report at this time.  

## 2016-02-27 NOTE — H&P (Signed)
Patient ID: COBEE MIAO MRN: [id number], DOB/AGE: 06/18/1941   Admit date: 02/27/2016   Primary Physician: Thayer Headings, MD Primary Cardiologist: Dr. Herbie [place] HF specialist: Dr. [name]   Pt. Profile:  Mr. Critcher is a 75 year old Caucasian male with past medical history of HTN, DM2, HLD, CAD s/p CABG 1998, ICM with baseline EF 25% s/p St Jude CRT-D, chronic afib on coumadin, severe spinal stenosis, RA, LBBB, chronic chest pain and chronic respiratory failure not on home O2 presented with crescendo chest pain  Problem List  Past Medical History  Diagnosis Date  . MI (myocardial infarction) (HCC)     "10/14/2014 I was told I'd had 2 MI's & didn't even know it" (11/27/2014)  . Coronary artery disease involving native coronary artery     a. CABG 1998. b. MYOVIEW 11/2015: Large, Severe FIXED Defect in Cx distribution +/- dLAD.  SCAR w/o Ishcemia.   . 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 NSTEMI 02/2013: Progression of dz in SVG-RPDA followed by severe stenosis in grafted vessel, not likely safe for PCI - med rx. b. NSTEMI 10/2014: patent LIMA-LAD and SVG to OM, TO SVG-PDA with TO PDA and 60% proximal RCA, some left =>PDA collaterals; culprit was likely occlusion of SVG-PDA though this may have been subacute given some collaterals.  . Chronic chest pain   . Cardiomyopathy, ischemic 1998    s/p ICD; Intolerant of Ranexa.  . Chronic combined systolic and diastolic CHF, NYHA class 3 (HCC)     a. EF prev 25%, improved to 45-50% in 2014. b. 10/2014: EF back downt o 25%.  . Chronic atrial fibrillation (HCC)     Rate controlled - Now with ICD/PPM; On Coumadin  . Digitalis toxicity   . Hyperkalemia     a. Spironolactone d/c'd due to this.  [name] LBBB (left bundle branch block)   . Moderate mitral regurgitation by prior echocardiogram 10/2014 echo  . CKD (chronic kidney disease), stage III   . Hyperlipidemia LDL goal <70   . Moderate mitral regurgitation  10/2014 echo  . Type II diabetes mellitus (HCC)   . Rheumatoid arthritis (HCC)   . Long term current use of systemic steroids     for RA  . Hypothyroidism   . 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 Pleuritis, purulent (HCC)     h/o------ with negative vats biopsy; 1994  . 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 Spinal stenosis     Very limited mobility - almost wheelchair bound  . Hip pain     a. adductor muscle tears on prior MRI.  [name] GERD (gastroesophageal reflux disease)     Past Surgical History  Procedure Laterality Date  . Lung surgery  1994    "infection in lungs; scraped it out on right; 6 wks later they did left; clipped left diaphragm & paralyzed it"  . Total knee arthroplasty Bilateral 2000's  . Carpal tunnel release Bilateral 1990's  . Cataract extraction w/ intraocular lens  implant, bilateral Bilateral 1990's  . Cardiac catheterization Left 01/11/2013    Severe CAD with notable progression of disease in the SVG-RPDA followed by severe stenosis in grafted vessel, region not safely "reachede" for PCI, moderate ischemic CM no notable chage in EF, continue medical therapy  . Cardiac catheterization  04/03/2011    Left circumflex 95%  stenosis, essentially unchanged from last cath  . Cardiac catheterization  04/01/2010    LAD 99% proximal stenosis with vague flow, circumflex 80% stenosis, essentially unchanged form last cath  . Cardiac catheterization  03/28/2010    Mild pulmonary hypertension, medical therapy  . Cardiac catheterization  04/30/2009    LAD 95% stenosis, essentially unchanged fomr last cath, medical therapy  . Cardiac catheterization  12/05/2008    2.5x12 apex, predilation in the distal PLA branch, 3x23 XIENCE V durg eluting stent and post dilated with a 3.25x20 [place] VOYAGER at 15 atmospheres  . Left heart catheterization with coronary/graft  angiogram N/A 01/13/2013    Procedure: LEFT HEART CATHETERIZATION WITH Isabel Caprice;  Surgeon: Marykay Lex, MD;  Location: [facility] CATH LAB;  Service: Cardiovascular;  Laterality: N/A;  . Left and right heart catheterization with coronary/graft angiogram N/A 10/16/2014    Procedure: LEFT AND RIGHT HEART CATHETERIZATION WITH Isabel Caprice;  Surgeon: Marykay Lex, MD;  Location: [facility] CATH LAB;  Service: Cardiovascular;  Laterality: N/A;  . Coronary artery bypass graft  05/1997    "CABG X7"  . Patella fracture surgery Right "before replacement"  . Knee arthroscopy Left "before replacement  . Ep implantable device N/A 05/06/2015    Procedure: BiV ICD Insertion CRT-D;  Surgeon: Marinus Maw, MD;  Location: [facility] INVASIVE CV LAB;  Service: Cardiovascular;  Laterality: N/A;  . Nm myoview ltd  11/2015     Large, Severe FIXED Defect in Cx distribution +/- dLAD.  SCAR w/o Ishcemia.      Allergies  Allergies  Allergen Reactions  . Butrans [Buprenorphine] Shortness Of Breath  . Influenza Vaccines Other (See Comments)    Severe coughing for weeks after vaccine; also had flu like symptoms for about 7 to 8 weeks.   . Lanoxin [Digoxin] Other (See Comments)    Nausea, anorexia, caused tremendous weight loss  . Ranexa [Ranolazine Er] Other (See Comments)    TOLD NEVER TO TAKE MED-PER MD MD put him back on this medication and is tolerating well  . Lisinopril Other (See Comments)    cough  . Clarithromycin Other (See Comments)    Unknown, thinks "blisters in mouth"  . Statins Other (See Comments)    unknown    HPI  Mr. Attar is a 75 year old Caucasian male with past medical history of HTN, DM2, HLD, CAD s/p CABG 1998, ICM with baseline EF 25% s/p St Jude CRT-D, chronic afib on coumadin, severe spinal stenosis, RA, LBBB, chronic chest pain and chronic respiratory failure not on home O2. He has chronically elevated left hemidiaphragm. He had prolonged hospitalization in November 2015  with cardiogenic shock requiring intra-aortic balloon pump and milrinone which is likely the result of occlusion of SVG to PDA. He was later weaned off milrinone. He underwent placement of St. Jude CRT-D in 04/2015. Despite on maximal antianginal therapy, he continued to have chronic chest pain worsen with exertion. Antianginal therapy include 120 mg daily Imdur, beta blocker, 1000 mg twice a day Ranexa.  He had negative Myoview in December 2016 which showed large inferolateral scar but no ischemia. Despite increasing Toprol-XL, previous interrogation shows he was only pacing 40-50% of the time. He was previously evaluated by Dr. Ladona Ridgel, fell he is a poor candidate for AV nodal ablation because his poor functional status from spinal stenosis. He was last seen by Dr. [name] on 01/23/2016, at which time his weight was 214 pounds and he did not appears to be significantly fluid overloaded. He  does have history of digoxin toxicity. He has chronic dyspnea worsening with exertion, but also occur at rest. He denies any increasing shortness of breath recently. For the last 2-3 days, he has been experiencing increasing episodes of chest discomfort. He has taken more than a dozen sublingual nitro since yesterday which is very unusual even taken into account of his chronic chest pain. This has prompted the patient to seek medical attention at [facility] on 01/30/2016. According to the patient, nitroglycerin does take away the pain, however his chest discomfort would quickly come back. He described as a left-sided chest discomfort without radiation. Initial EKG shows wide complex tachycardia. After the heart rate was slowed down, there appears to be in atrial fibrillation with right bundle branch block. Significant laboratory finding include creatinine of 1.99. Potassium 5.7. White blood cell count 14.7. Troponin negative.   Home Medications  Prior to Admission medications   Medication Sig Start Date End Date  Taking? Authorizing Provider  atorvastatin (LIPITOR) 40 MG tablet Take 40 mg by mouth daily. 09/09/15   Historical Provider, MD  fluticasone (FLONASE) 50 MCG/ACT nasal spray Place 2 sprays into both nostrils daily.     Historical Provider, MD  hydrALAZINE (APRESOLINE) 25 MG tablet Take 1.5 tablets (37.5 mg total) by mouth 3 (three) times daily. 01/23/16   Laurey Morale, MD  insulin lispro (HUMALOG) 100 UNIT/ML injection Inject 8 Units into the skin 2 (two) times daily before a meal.    Historical Provider, MD  isosorbide mononitrate (IMDUR) 60 MG 24 hr tablet Take 2 tablets (120 mg total) by mouth daily. 01/23/16   Laurey Morale, MD  levothyroxine (SYNTHROID, LEVOTHROID) 175 MCG tablet Take 175 mcg by mouth daily before breakfast.    Historical Provider, MD  metoprolol succinate (TOPROL-XL) 100 MG 24 hr tablet TAKE 1 & 1/2 TABLETS BY MOUTH TWICE A DAY 11/26/15   Marily Lente, NP  nitroGLYCERIN (NITROSTAT) 0.4 MG SL tablet Place 1 tablet (0.4 mg total) under the tongue every 5 (five) minutes as needed for chest pain. KEEP OV. 02/18/16   Runell Gess, MD  nystatin cream (MYCOSTATIN) Apply 1 application topically daily as needed for dry skin.  03/19/15   Historical Provider, MD  Omega-3 Fatty Acids (FISH OIL) 1000 MG CAPS Take 3,000 mg by mouth every morning.    Historical Provider, MD  omeprazole (PRILOSEC) 20 MG capsule Take 20 mg by mouth daily.    Historical Provider, MD  oxyCODONE-acetaminophen (PERCOCET/ROXICET) 5-325 MG per tablet Take 1-2 tablets by mouth every 4 (four) hours as needed for severe pain. Reported on 12/03/2015    Historical Provider, MD  polyethylene glycol (MIRALAX / GLYCOLAX) packet Take 17 g by mouth daily as needed. Patient taking differently: Take 17 g by mouth daily as needed (CONSTIPATION).  11/30/14   Rhonda G Barrett, PA-C  predniSONE (DELTASONE) 5 MG tablet Take 1 tablet by mouth 2 (two) times daily. 06/04/15   Historical Provider, MD  ranolazine (RANEXA) 1000 MG SR  tablet Take 1 tablet (1,000 mg total) by mouth 2 (two) times daily. 09/19/15   Laurey Morale, MD  spironolactone (ALDACTONE) 25 MG tablet TAKE 1/2 TABLET DAILY 11/01/15   Laurey Morale, MD  torsemide (DEMADEX) 20 MG tablet Take 2 tablets daily and take 1 extra tablet every other day. 02/06/16   Bevelyn Buckles Bensimhon, MD  TOUJEO SOLOSTAR 300 UNIT/ML SOPN Inject 32 Units into the skin every morning.  02/11/15   Historical Provider,  MD  triamcinolone cream (KENALOG) 0.1 % Apply 1 application topically daily as needed (for rash).  02/20/13   Historical Provider, MD  warfarin (COUMADIN) 5 MG tablet Take 1-2 tablets by mouth daily as directed Patient taking differently: Take 7.5-10 mg by mouth daily at 6 PM. 5mg  on Friday and 7.5mg  all other days 10/10/14   10/12/14, RPH-CPP    Family History  Family History  Problem Relation Age of Onset  . Heart disease Mother   . Heart disease Father   . Heart disease Brother   . Heart disease Maternal Aunt   . Cancer      Social History  Social History   Social History  . Marital Status: Married    Spouse Name: N/A  . Number of Children: N/A  . Years of Education: N/A   Occupational History  . Not on file.   Social History Main Topics  . Smoking status: Never Smoker   . Smokeless tobacco: Never Used  . Alcohol Use: No  . Drug Use: No  . Sexual Activity: Not Currently   Other Topics Concern  . Not on file   Social History Narrative     Review of Systems General:  No chills, fever, night sweats or weight changes.  Cardiovascular:  No edema, orthopnea, palpitations, paroxysmal nocturnal dyspnea. +chest pain, dyspnea on exertion Dermatological: No rash, lesions/masses Respiratory: No cough +chronic dyspnea (currently at baseline) Urologic: No hematuria, dysuria Abdominal:   No nausea, vomiting, diarrhea, bright red blood per rectum, melena, or hematemesis Neurologic:  No visual changes, wkns, changes in mental status. All other  systems reviewed and are otherwise negative except as noted above.  Physical Exam  Blood pressure 113/84, pulse 91, temperature 98.1 F (36.7 C), temperature source Oral, resp. rate 21, SpO2 97 %.  General: Pleasant, NAD Psych: Normal affect. Neuro: Alert and oriented X 3. Moves all extremities spontaneously. HEENT: Normal  Neck: Supple without bruits or JVD. Lungs:  Resp regular and unlabored, CTA. Heart: Irregular. No s3, s4, or murmurs. Abdomen: Soft, non-tender, non-distended, BS + x 4.  Extremities: No clubbing, cyanosis. DP/PT/Radials 2+ and equal bilaterally. Trace edema   Labs  Troponin (Point of Care Test)  Recent Labs  02/27/16 1400  TROPIPOC 0.06   No results for input(s): CKTOTAL, CKMB, TROPONINI in the last 72 hours. Lab Results  Component Value Date   WBC 14.7* 02/27/2016   HGB 14.5 02/27/2016   HCT 43.7 02/27/2016   MCV 100.2* 02/27/2016   PLT 190 02/27/2016    Recent Labs Lab 02/27/16 1136  NA 135  K 5.7*  CL 99*  CO2 18*  BUN 27*  CREATININE 1.99*  CALCIUM 9.3  GLUCOSE 233*   Lab Results  Component Value Date   CHOL 113 12/03/2015   HDL 25* 12/03/2015   LDLCALC 22 12/03/2015   TRIG 331* 12/03/2015   Lab Results  Component Value Date   DDIMER 0.28 01/12/2013     Radiology/Studies  Dg Chest 2 View  02/27/2016  CLINICAL DATA:  Left chest pain for 2 months EXAM: CHEST  2 VIEW COMPARISON:  May 16, 2015 FINDINGS: The heart size and mediastinal contours are stable. The heart size is enlarged. Patient is status post prior CABG and median sternotomy. Cardiac pacemaker is unchanged. Both lungs are clear. The visualized skeletal structures are unremarkable. IMPRESSION: No active cardiopulmonary disease. Electronically Signed   By: May 18, 2015 M.D.   On: 02/27/2016 14:06    ECG  Atrial fibrillation with right bundle branch block.  Echocardiogram 01/21/2015  LV EF: 25% -   30%  ------------------------------------------------------------------- Indications:   CHF - 428.0.  ------------------------------------------------------------------- History:  PMH:  Atrial fibrillation. Risk factors: Diabetes mellitus.  ------------------------------------------------------------------- Study Conclusions  - Left ventricle: The cavity size was normal. Wall thickness was normal. Systolic function was severely reduced. The estimated ejection fraction was in the range of 25% to 30%. Severe hypokinesis of the entire myocardium. - Mitral valve: There was mild regurgitation. - Left atrium: The atrium was moderately dilated. - Right atrium: The atrium was mildly to moderately dilated.    ASSESSMENT AND PLAN  1. Chest pain concerning for unstable angina  - hold coumadin, allow INR to drift down. Soft hydration. Will discuss with MD regarding cardiac cath. He is already on maximal medical therapy. His last myoview was 3 month ago, there is no point to repeat the myoview, only option is cardiac cath  2.  Chronic systolic HF: although he does have 1+ pitting edema in bilateral LE, he denies any worsening recently. CXR negative. Weight same as last office visit. I do not think he is significantly fluid overloaded  3. CAD s/p CABG 1998  4. ICM with baseline EF 25% s/p St Jude CRT-D 5. HTN 6. DM2 7. HLD  8. chronic afib on coumadin 9. severe spinal stenosis  Signed, Azalee Course, PA-C 02/27/2016, 3:47 PM   Agree with note by Azalee Course PA-C  Pt well known to me with ISCM s/p remote CABG, CRT ICD, CAF on coumadin AC, CRI, COPD. His last cath 11/15 was notable for an occluded SVG to PDA (prob culprit vessel) with CGS requiring IABP. He has been having crescendo SL NTG responsive again for the past few weeks, worse today. Currently pain free. Exam benign. EKG w/o acute changes. Scr 2. INR >3. Will hold coumadin. Put on IV NTG. Cycle enz. Start IV hep once INR < 2.  Plan cath on MOnday.  Runell Gess, M.D., FACP, [facility], [facility] [address] [place], [place]  [address]  [phone] 02/27/2016 4:44 PM

## 2016-02-27 NOTE — Progress Notes (Signed)
ANTICOAGULATION CONSULT NOTE - Initial Consult  Pharmacy Consult for Heparin drip (Start IV hep once INR < 2) Indication: h/o atrial fibrillation  Allergies  Allergen Reactions  . Butrans [Buprenorphine] Shortness Of Breath  . Influenza Vaccines Other (See Comments)    Severe coughing for weeks after vaccine; also had flu like symptoms for about 7 to 8 weeks.   . Lanoxin [Digoxin] Other (See Comments)    Nausea, anorexia, caused tremendous weight loss  . Ranexa [Ranolazine Er] Other (See Comments)    TOLD NEVER TO TAKE MED-PER MD MD put him back on this medication and is tolerating well  . Lisinopril Other (See Comments)    cough  . Clarithromycin Other (See Comments)    Unknown, thinks "blisters in mouth"  . Statins Other (See Comments)    unknown    Patient Measurements: Height: 6' (182.9 cm) Weight: 207 lb 8 oz (94.121 kg) IBW/kg (Calculated) : 77.6 Heparin Dosing Weight: 94.1 kg  Vital Signs: Temp: 98.1 F (36.7 C) (03/16 1443) Temp Source: Oral (03/16 1443) BP: 111/79 mmHg (03/16 1730) Pulse Rate: 82 (03/16 1730)  Labs:  Recent Labs  02/25/16 1214 02/27/16 1136  HGB  --  14.5  HCT  --  43.7  PLT  --  190  LABPROT  --  25.6*  INR 3.8 2.37*  CREATININE  --  1.99*    Estimated Creatinine Clearance: 37.6 mL/min (by C-G formula based on Cr of 1.99).   Medical History: Past Medical History  Diagnosis Date  . MI (myocardial infarction) (HCC)     "10/14/2014 I was told I'd had 2 MI's & didn't even know it" (11/27/2014)  . Coronary artery disease involving native coronary artery     a. CABG 1998. b. MYOVIEW 11/2015: Large, Severe FIXED Defect in Cx distribution +/- dLAD.  SCAR w/o Ishcemia.   . 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 NSTEMI 02/2013: Progression of dz in SVG-RPDA followed by severe stenosis in grafted vessel, not likely safe for PCI - med rx. b. NSTEMI 10/2014: patent LIMA-LAD and SVG to OM, TO SVG-PDA with TO PDA  and 60% proximal RCA, some left =>PDA collaterals; culprit was likely occlusion of SVG-PDA though this may have been subacute given some collaterals.  . Chronic chest pain   . Cardiomyopathy, ischemic 1998    s/p ICD; Intolerant of Ranexa.  . Chronic combined systolic and diastolic CHF, NYHA class 3 (HCC)     a. EF prev 25%, improved to 45-50% in 2014. b. 10/2014: EF back downt o 25%.  . Chronic atrial fibrillation (HCC)     Rate controlled - Now with ICD/PPM; On Coumadin  . Digitalis toxicity   . Hyperkalemia     a. Spironolactone d/c'd due to this.  [name] LBBB (left bundle branch block)   . Moderate mitral regurgitation by prior echocardiogram 10/2014 echo  . CKD (chronic kidney disease), stage III   . Hyperlipidemia LDL goal <70   . Moderate mitral regurgitation 10/2014 echo  . Type II diabetes mellitus (HCC)   . Rheumatoid arthritis (HCC)   . Long term current use of systemic steroids     for RA  . Hypothyroidism   . 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 Pleuritis, purulent (HCC)     h/o------ with negative vats biopsy; 1994  . Diaphragmatic paralysis     Elevated hemidiaphragm [J98.6] -- paralyzed left hemidiaphragm as  a result of surgery in 1994  . Spinal stenosis     Very limited mobility - almost wheelchair bound  . Hip pain     a. adductor muscle tears on prior MRI.  [name] GERD (gastroesophageal reflux disease)     Medications:  Prescriptions prior to admission  Medication Sig Dispense Refill Last Dose  . atorvastatin (LIPITOR) 40 MG tablet Take 40 mg by mouth daily.  0 02/27/2016 at Unknown time  . fluticasone (FLONASE) 50 MCG/ACT nasal spray Place 2 sprays into both nostrils daily.    02/27/2016 at Unknown time  . hydrALAZINE (APRESOLINE) 25 MG tablet Take 1.5 tablets (37.5 mg total) by mouth 3 (three) times daily. 135 tablet 3 02/27/2016 at Unknown time  . insulin lispro (HUMALOG) 100 UNIT/ML injection Inject 10 Units into the skin 2 (two)  times daily before a meal.    02/27/2016 at Unknown time  . isosorbide mononitrate (IMDUR) 60 MG 24 hr tablet Take 2 tablets (120 mg total) by mouth daily. 60 tablet 11 02/27/2016 at Unknown time  . levothyroxine (SYNTHROID, LEVOTHROID) 175 MCG tablet Take 175 mcg by mouth daily before breakfast.   02/27/2016 at Unknown time  . metoprolol succinate (TOPROL-XL) 100 MG 24 hr tablet TAKE 1 & 1/2 TABLETS BY MOUTH TWICE A DAY 260 tablet 2 02/27/2016 at 0830  . nitroGLYCERIN (NITROSTAT) 0.4 MG SL tablet Place 1 tablet (0.4 mg total) under the tongue every 5 (five) minutes as needed for chest pain. KEEP OV. 25 tablet 1 02/27/2016 at Unknown time  . nystatin cream (MYCOSTATIN) Apply 1 application topically daily as needed for dry skin.   4 02/27/2016 at Unknown time  . Omega-3 Fatty Acids (FISH OIL) 1000 MG CAPS Take 3,000 mg by mouth every morning.   02/27/2016 at Unknown time  . omeprazole (PRILOSEC) 20 MG capsule Take 20 mg by mouth daily.   02/27/2016 at Unknown time  . oxyCODONE-acetaminophen (PERCOCET/ROXICET) 5-325 MG per tablet Take 1-2 tablets by mouth every 4 (four) hours as needed for severe pain. Reported on 12/03/2015   Past Month at Unknown time  . predniSONE (DELTASONE) 5 MG tablet Take 1 tablet by mouth 2 (two) times daily.  1 02/27/2016 at Unknown time  . ranolazine (RANEXA) 1000 MG SR tablet Take 1 tablet (1,000 mg total) by mouth 2 (two) times daily. 60 tablet 11 02/27/2016 at Unknown time  . spironolactone (ALDACTONE) 25 MG tablet TAKE 1/2 TABLET DAILY 15 tablet 3 02/27/2016 at Unknown time  . torsemide (DEMADEX) 20 MG tablet Take 2 tablets daily and take 1 extra tablet every other day. 45 tablet 11 02/27/2016 at Unknown time  . TOUJEO SOLOSTAR 300 UNIT/ML SOPN Inject 32 Units into the skin every morning.   4 02/27/2016 at Unknown time  . triamcinolone cream (KENALOG) 0.1 % Apply 1 application topically daily as needed (for rash).    Past Week at Unknown time  . warfarin (COUMADIN) 5 MG tablet Take 1-2  tablets by mouth daily as directed (Patient taking differently: Take 5-7.5 mg by mouth daily at 6 PM. Take 7.5 mg on Sunday, Tuesday, and Thursday all other days (monday, Wednesday, Friday, Saturday) take 5mg .) 135 tablet 2 02/27/2016 at Unknown time  . polyethylene glycol (MIRALAX / GLYCOLAX) packet Take 17 g by mouth daily as needed. (Patient taking differently: Take 17 g by mouth daily as needed (CONSTIPATION). ) 14 each 0 Taking   Scheduled:  . [START ON 02/28/2016] aspirin  81 mg Oral Pre-Cath  . [  START ON 02/28/2016] aspirin EC  81 mg Oral Daily  . [START ON 02/28/2016] atorvastatin  40 mg Oral Daily  . fluticasone  2 spray Each Nare Daily  . hydrALAZINE  37.5 mg Oral TID  . [START ON 02/28/2016] insulin aspart  10 Units Subcutaneous BID AC  . [START ON 02/28/2016] insulin glargine  32 Units Subcutaneous q morning - 10a  . [START ON 02/28/2016] isosorbide mononitrate  120 mg Oral Daily  . [START ON 02/28/2016] levothyroxine  175 mcg Oral QAC breakfast  . metoprolol succinate  150 mg Oral BID  . [START ON 02/28/2016] pantoprazole  40 mg Oral Daily  . predniSONE  5 mg Oral BID  . ranolazine  1,000 mg Oral BID  . sodium chloride flush  3 mL Intravenous Q12H  . [START ON 02/28/2016] spironolactone  12.5 mg Oral Daily  . [START ON 02/28/2016] torsemide  40 mg Oral Daily    Assessment: 75 y.o male with PMH of HTN, DM2, HLD, CAD s/p CABG 1998, ICM with baseline EF 25% s/p St Jude CRT-D, chronic afib on coumadin, severe spinal stenosis, RA, LBBB, chronic chest pain and chronic respiratory failure not on home O2 presented with crescendo chest pain.  Patient reports he last took his coumadin today 02/27/16. INR on admission today is 2.37, therapeutic. Cardiologist is holding the coumadin and plan for cardiac cath on Monday, 03/02/16.   PTA coumadin dose is 5 mg daily except 7.5 mg every TTSat,  Last taken today 02/27/16.   Goal of Therapy:  To start IV heparin infusion when INR <2 Heparin level 0.3-0.7  units/ml Monitor platelets by anticoagulation protocol: Yes   Plan:  Coumadin is on hold. Start IV heparin infusion when INR <2.0 Daily INR  Thank you for allowing pharmacy to be part of this patients care team. Noah Delaine, RPh Clinical Pharmacist Pager: [phone] 02/27/2016,6:04 PM

## 2016-02-27 NOTE — ED Notes (Signed)
Pt gone to X-Ray 

## 2016-02-27 NOTE — ED Notes (Addendum)
Pt sats dropping in 80s at times.  Placed pt on 2lpm via [place].

## 2016-02-27 NOTE — Progress Notes (Signed)
Troponin 0.05, no s/s, MD notified, will continue to monitor, Thanks Lavonda Jumbo RN

## 2016-02-27 NOTE — ED Notes (Signed)
Pt c/o increasingly severe CP over past weeks, today the pain is unbearable. He has taken several doses of nitro since last night at midnight with some pain relief but pain returns after each dose. He c/o SOB, nausea, lightheadedness over past 2 days. He is A&Ox4, resp e/u

## 2016-02-28 ENCOUNTER — Telehealth: Payer: Self-pay | Admitting: Cardiology

## 2016-02-28 ENCOUNTER — Telehealth (HOSPITAL_COMMUNITY): Payer: Self-pay | Admitting: Vascular Surgery

## 2016-02-28 ENCOUNTER — Encounter: Payer: Self-pay | Admitting: Cardiology

## 2016-02-28 LAB — BASIC METABOLIC PANEL
Anion gap: 9 (ref 5–15)
BUN: 27 mg/dL — AB (ref 6–20)
CALCIUM: 9.1 mg/dL (ref 8.9–10.3)
CO2: 26 mmol/L (ref 22–32)
CREATININE: 1.64 mg/dL — AB (ref 0.61–1.24)
Chloride: 98 mmol/L — ABNORMAL LOW (ref 101–111)
GFR calc Af Amer: 45 mL/min — ABNORMAL LOW (ref >60)
GFR, EST NON AFRICAN AMERICAN: 39 mL/min — AB (ref >60)
GLUCOSE: 130 mg/dL — AB (ref 65–99)
Potassium: 4.6 mmol/L (ref 3.5–5.1)
Sodium: 133 mmol/L — ABNORMAL LOW (ref 135–145)

## 2016-02-28 LAB — CUP PACEART REMOTE DEVICE CHECK
Battery Remaining Longevity: 69 mo
Date Time Interrogation Session: 20170222080917
HighPow Impedance: 75 Ohm
HighPow Impedance: 75 Ohm
Implantable Lead Implant Date: 20160523
Implantable Lead Location: 753860
Lead Channel Impedance Value: 440 Ohm
Lead Channel Impedance Value: 490 Ohm
Lead Channel Sensing Intrinsic Amplitude: 12 mV
Lead Channel Setting Pacing Amplitude: 2 V
Lead Channel Setting Pacing Amplitude: 2.5 V
Lead Channel Setting Pacing Pulse Width: 0.7 ms
Lead Channel Setting Sensing Sensitivity: 0.5 mV
MDC IDC LEAD IMPLANT DT: 20160523
MDC IDC LEAD IMPLANT DT: 20160523
MDC IDC LEAD LOCATION: 753857
MDC IDC LEAD LOCATION: 753859
MDC IDC LEAD MODEL: 7122
MDC IDC MSMT BATTERY REMAINING PERCENTAGE: 86 %
MDC IDC MSMT BATTERY VOLTAGE: 3.02 V
MDC IDC MSMT LEADCHNL LV IMPEDANCE VALUE: 400 Ohm
MDC IDC SET LEADCHNL RV PACING PULSEWIDTH: 0.4 ms
Pulse Gen Serial Number: 7272291

## 2016-02-28 LAB — LIPID PANEL
Cholesterol: 104 mg/dL (ref 0–200)
HDL: 31 mg/dL — AB (ref >40)
LDL CALC: 54 mg/dL (ref 0–99)
Total CHOL/HDL Ratio: 3.4 RATIO
Triglycerides: 94 mg/dL (ref <150)
VLDL: 19 mg/dL (ref 0–40)

## 2016-02-28 LAB — TROPONIN I
TROPONIN I: 0.04 ng/mL — AB (ref <0.031)
TROPONIN I: 0.05 ng/mL — AB (ref <0.031)

## 2016-02-28 LAB — GLUCOSE, CAPILLARY
Glucose-Capillary: 135 mg/dL — ABNORMAL HIGH (ref 65–99)
Glucose-Capillary: 280 mg/dL — ABNORMAL HIGH (ref 65–99)
Glucose-Capillary: 171 mg/dL — ABNORMAL HIGH (ref 65–99)
Glucose-Capillary: 164 mg/dL — ABNORMAL HIGH (ref 65–99)

## 2016-02-28 LAB — HEPARIN LEVEL (UNFRACTIONATED): Heparin Unfractionated: 0.27 IU/mL — ABNORMAL LOW (ref 0.30–0.70)

## 2016-02-28 LAB — PROTIME-INR
INR: 1.67 — ABNORMAL HIGH (ref 0.00–1.49)
PROTHROMBIN TIME: 19.7 s — AB (ref 11.6–15.2)

## 2016-02-28 MED ORDER — HEPARIN BOLUS VIA INFUSION
2000.0000 [IU] | Freq: Once | INTRAVENOUS | Status: AC
  Administered 2016-02-28: 2000 [IU] via INTRAVENOUS
  Filled 2016-02-28: qty 2000

## 2016-02-28 MED ORDER — HEPARIN (PORCINE) IN NACL 100-0.45 UNIT/ML-% IJ SOLN
1250.0000 [IU]/h | INTRAMUSCULAR | Status: DC
  Administered 2016-02-28: 1100 [IU]/h via INTRAVENOUS
  Administered 2016-02-29 – 2016-03-01 (×3): 1250 [IU]/h via INTRAVENOUS
  Filled 2016-02-28 (×4): qty 250

## 2016-02-28 NOTE — Telephone Encounter (Signed)
New message     Pt [facility] admitted [facility].  They want Dr Herbie Baltimore to go see him because he may need a cath.

## 2016-02-28 NOTE — Telephone Encounter (Signed)
Will forward this information to Dr. [name] to address.  Ave Filter

## 2016-02-28 NOTE — Progress Notes (Signed)
ANTICOAGULATION CONSULT NOTE - Follow Up Consult  Pharmacy Consult for heparin  Indication: chest pain/ACS , afib   Allergies  Allergen Reactions  . Butrans [Buprenorphine] Shortness Of Breath  . Influenza Vaccines Other (See Comments)    Severe coughing for weeks after vaccine; also had flu like symptoms for about 7 to 8 weeks.   . Lanoxin [Digoxin] Other (See Comments)    Nausea, anorexia, caused tremendous weight loss  . Ranexa [Ranolazine Er] Other (See Comments)    TOLD NEVER TO TAKE MED-PER MD MD put him back on this medication and is tolerating well  . Lisinopril Other (See Comments)    cough  . Clarithromycin Other (See Comments)    Unknown, thinks "blisters in mouth"  . Statins Other (See Comments)    unknown    Patient Measurements: Height: 6' (182.9 cm) Weight: 207 lb 8 oz (94.121 kg) IBW/kg (Calculated) : 77.6 Heparin Dosing Weight: 94kg  Vital Signs: Temp: 97.5 F (36.4 C) (03/17 0600) Temp Source: Oral (03/17 0600) BP: 105/71 mmHg (03/17 0600) Pulse Rate: 84 (03/17 0600)  Labs:  Recent Labs  02/25/16 1214 02/27/16 1136 02/27/16 1800 02/28/16 0020 02/28/16 0621  HGB  --  14.5  --   --   --   HCT  --  43.7  --   --   --   PLT  --  190  --   --   --   LABPROT  --  25.6*  --   --  19.7*  INR 3.8 2.37*  --   --  1.67*  CREATININE  --  1.99*  --   --  1.64*  TROPONINI  --   --  0.05* 0.05* 0.04*    Estimated Creatinine Clearance: 45.6 mL/min (by C-G formula based on Cr of 1.64).   Medications:  Scheduled:  . aspirin  81 mg Oral Pre-Cath  . aspirin EC  81 mg Oral Daily  . atorvastatin  40 mg Oral Daily  . fluticasone  2 spray Each Nare Daily  . hydrALAZINE  37.5 mg Oral TID  . insulin aspart  10 Units Subcutaneous BID AC  . insulin glargine  32 Units Subcutaneous q morning - 10a  . isosorbide mononitrate  120 mg Oral Daily  . levothyroxine  175 mcg Oral QAC breakfast  . metoprolol succinate  150 mg Oral BID  . pantoprazole  40 mg Oral Daily   . predniSONE  5 mg Oral BID  . ranolazine  1,000 mg Oral BID  . sodium chloride flush  3 mL Intravenous Q12H  . spironolactone  12.5 mg Oral Daily  . torsemide  40 mg Oral Daily   Infusions:  . sodium chloride      Assessment: 75 yo male here with CP and with history of CAD w/ CABG. He is on coumadin PTA for afib and to begin heparin when INR < 2.0 (plans for cath).   Goal of Therapy:  Heparin level 0.3-0.7 units/ml Monitor platelets by anticoagulation protocol: Yes   Plan: -Heparin bolus 2000 units IV followed by 1100 units/hr (~ units/kg/hr) -Heparin level in 8 hours and daily wth CBC daily -Will follow cath plans  Harland German, Pharm D 02/28/2016 8:28 AM

## 2016-02-28 NOTE — Progress Notes (Signed)
Heparin infusing started along with bolus. Verified with RN Michele Rockers.  Sim Boast, RN

## 2016-02-28 NOTE — Telephone Encounter (Signed)
SPOKE TO PATIENT'S DAUGHTER IN LAW INFORMED HER DR HARDING WILL NOT BE ABLE TO SEE PATIENT TODAY DR HARDING IS NOT AVAILABLE. REASSURE DAUGHTER  THAT DR Herbie Baltimore 'S PARTNER WILL TAKE A GREAT CARE OF THE PATIENT SHE VERBALIZED UNDERSTANDING

## 2016-02-28 NOTE — Progress Notes (Signed)
Subjective:  No CP. SOB improved on O2  Objective:  Temp:  [97.5 F (36.4 C)-98.5 F (36.9 C)] 98.5 F (36.9 C) (03/17 1120) Pulse Rate:  [64-112] 80 (03/17 1120) Resp:  [18-32] 18 (03/17 1120) BP: (93-125)/(56-91) 93/60 mmHg (03/17 1120) SpO2:  [93 %-99 %] 98 % (03/17 1120) Weight:  [207 lb 8 oz (94.121 kg)-214 lb 8.1 oz (97.3 kg)] 207 lb 8 oz (94.121 kg) (03/16 1802) Weight change:   Intake/Output from previous day: 03/16 0701 - 03/17 0700 In: 460 [P.O.:460] Out: 900 [Urine:900]  Intake/Output from this shift: Total I/O In: 639.6 [P.O.:600; I.V.:39.6] Out: 1125 [Urine:1125]  Physical Exam: General appearance: alert and no distress Neck: no adenopathy, no carotid bruit, no JVD, supple, symmetrical, trachea midline and thyroid not enlarged, symmetric, no tenderness/mass/nodules Lungs: clear to auscultation bilaterally Heart: regular rate and rhythm, S1, S2 normal, no murmur, click, rub or gallop Extremities: extremities normal, atraumatic, no cyanosis or edema  Lab Results: Results for orders placed or performed during the hospital encounter of 02/27/16 (from the past 48 hour(s))  Basic metabolic panel     Status: Abnormal   Collection Time: 02/27/16 11:36 AM  Result Value Ref Range   Sodium 135 135 - 145 mmol/L   Potassium 5.7 (H) 3.5 - 5.1 mmol/L   Chloride 99 (L) 101 - 111 mmol/L   CO2 18 (L) 22 - 32 mmol/L   Glucose, Bld 233 (H) 65 - 99 mg/dL   BUN 27 (H) 6 - 20 mg/dL   Creatinine, Ser 1.99 (H) 0.61 - 1.24 mg/dL   Calcium 9.3 8.9 - 10.3 mg/dL   GFR calc non Af Amer 31 (L) >60 mL/min   GFR calc Af Amer 36 (L) >60 mL/min    Comment: (NOTE) The eGFR has been calculated using the CKD EPI equation. This calculation has not been validated in all clinical situations. eGFR's persistently <60 mL/min signify possible Chronic Kidney Disease.    Anion gap 18 (H) 5 - 15  CBC     Status: Abnormal   Collection Time: 02/27/16 11:36 AM  Result Value Ref Range   WBC 14.7 (H) 4.0 - 10.5 K/uL   RBC 4.36 4.22 - 5.81 MIL/uL   Hemoglobin 14.5 13.0 - 17.0 g/dL   HCT 43.7 39.0 - 52.0 %   MCV 100.2 (H) 78.0 - 100.0 fL   MCH 33.3 26.0 - 34.0 pg   MCHC 33.2 30.0 - 36.0 g/dL   RDW 15.3 11.5 - 15.5 %   Platelets 190 150 - 400 K/uL  Protime-INR - (order if Patient is taking Coumadin / Warfarin)     Status: Abnormal   Collection Time: 02/27/16 11:36 AM  Result Value Ref Range   Prothrombin Time 25.6 (H) 11.6 - 15.2 seconds   INR 2.37 (H) 0.00 - 1.49  I-stat troponin, ED (not at [facility])     Status: None   Collection Time: 02/27/16  2:00 PM  Result Value Ref Range   Troponin i, poc 0.06 0.00 - 0.08 ng/mL   Comment 3            Comment: Due to the release kinetics of cTnI, a negative result within the first hours of the onset of symptoms does not rule out myocardial infarction with certainty. If myocardial infarction is still suspected, repeat the test at appropriate intervals.   Brain natriuretic peptide     Status: Abnormal   Collection Time: 02/27/16  3:20 PM  Result Value  Ref Range   B Natriuretic Peptide 1124.9 (H) 0.0 - 100.0 pg/mL  Troponin I     Status: Abnormal   Collection Time: 02/27/16  6:00 PM  Result Value Ref Range   Troponin I 0.05 (H) <0.031 ng/mL    Comment:        PERSISTENTLY INCREASED TROPONIN VALUES IN THE RANGE OF 0.04-0.49 ng/mL CAN BE SEEN IN:       -UNSTABLE ANGINA       -CONGESTIVE HEART FAILURE       -MYOCARDITIS       -CHEST TRAUMA       -ARRYHTHMIAS       -LATE PRESENTING MYOCARDIAL INFARCTION       -COPD   CLINICAL FOLLOW-UP RECOMMENDED.   Glucose, capillary     Status: Abnormal   Collection Time: 02/27/16  6:15 PM  Result Value Ref Range   Glucose-Capillary 156 (H) 65 - 99 mg/dL  Glucose, capillary     Status: Abnormal   Collection Time: 02/27/16  9:03 PM  Result Value Ref Range   Glucose-Capillary 231 (H) 65 - 99 mg/dL  Troponin I     Status: Abnormal   Collection Time: 02/28/16 12:20 AM  Result  Value Ref Range   Troponin I 0.05 (H) <0.031 ng/mL    Comment:        PERSISTENTLY INCREASED TROPONIN VALUES IN THE RANGE OF 0.04-0.49 ng/mL CAN BE SEEN IN:       -UNSTABLE ANGINA       -CONGESTIVE HEART FAILURE       -MYOCARDITIS       -CHEST TRAUMA       -ARRYHTHMIAS       -LATE PRESENTING MYOCARDIAL INFARCTION       -COPD   CLINICAL FOLLOW-UP RECOMMENDED.   Glucose, capillary     Status: Abnormal   Collection Time: 02/28/16  6:11 AM  Result Value Ref Range   Glucose-Capillary 135 (H) 65 - 99 mg/dL   Comment 1 Notify RN    Comment 2 Document in Chart   Lipid panel     Status: Abnormal   Collection Time: 02/28/16  6:17 AM  Result Value Ref Range   Cholesterol 104 0 - 200 mg/dL   Triglycerides 94 <150 mg/dL   HDL 31 (L) >40 mg/dL   Total CHOL/HDL Ratio 3.4 RATIO   VLDL 19 0 - 40 mg/dL   LDL Cholesterol 54 0 - 99 mg/dL    Comment:        Total Cholesterol/HDL:CHD Risk Coronary Heart Disease Risk Table                     Men   Women  1/2 Average Risk   3.4   3.3  Average Risk       5.0   4.4  2 X Average Risk   9.6   7.1  3 X Average Risk  23.4   11.0        Use the calculated Patient Ratio above and the CHD Risk Table to determine the patient's CHD Risk.        ATP III CLASSIFICATION (LDL):  <100     mg/dL   Optimal  100-129  mg/dL   Near or Above                    Optimal  130-159  mg/dL   Borderline  160-189  mg/dL   High  >190  mg/dL   Very High   Troponin I     Status: Abnormal   Collection Time: 02/28/16  6:21 AM  Result Value Ref Range   Troponin I 0.04 (H) <0.031 ng/mL    Comment:        PERSISTENTLY INCREASED TROPONIN VALUES IN THE RANGE OF 0.04-0.49 ng/mL CAN BE SEEN IN:       -UNSTABLE ANGINA       -CONGESTIVE HEART FAILURE       -MYOCARDITIS       -CHEST TRAUMA       -ARRYHTHMIAS       -LATE PRESENTING MYOCARDIAL INFARCTION       -COPD   CLINICAL FOLLOW-UP RECOMMENDED.   Basic metabolic panel     Status: Abnormal   Collection Time:  02/28/16  6:21 AM  Result Value Ref Range   Sodium 133 (L) 135 - 145 mmol/L   Potassium 4.6 3.5 - 5.1 mmol/L   Chloride 98 (L) 101 - 111 mmol/L   CO2 26 22 - 32 mmol/L   Glucose, Bld 130 (H) 65 - 99 mg/dL   BUN 27 (H) 6 - 20 mg/dL   Creatinine, Ser 1.64 (H) 0.61 - 1.24 mg/dL   Calcium 9.1 8.9 - 10.3 mg/dL   GFR calc non Af Amer 39 (L) >60 mL/min   GFR calc Af Amer 45 (L) >60 mL/min    Comment: (NOTE) The eGFR has been calculated using the CKD EPI equation. This calculation has not been validated in all clinical situations. eGFR's persistently <60 mL/min signify possible Chronic Kidney Disease.    Anion gap 9 5 - 15  Protime-INR     Status: Abnormal   Collection Time: 02/28/16  6:21 AM  Result Value Ref Range   Prothrombin Time 19.7 (H) 11.6 - 15.2 seconds   INR 1.67 (H) 0.00 - 1.49  Glucose, capillary     Status: Abnormal   Collection Time: 02/28/16 11:17 AM  Result Value Ref Range   Glucose-Capillary 280 (H) 65 - 99 mg/dL    Imaging: Imaging results have been reviewed  Tele- Paced rhythm ( I personally reviewed) Assessment/Plan:   1. Active Problems: 2.   Unstable angina ([place]) 3.   Time Spent Directly with Patient:  15 minutes  Length of Stay:  LOS: 1 day   No CP since admission. SOB improved on O2. INR has come down nicely to 1.67 ( Coumadin on hold, IV hep started per pharm). SCr has come down as well. Exam benign. Enz low and flat. EKG paced. Plan cath on Monday.  [name] 02/28/2016, 1:40 PM

## 2016-02-28 NOTE — Progress Notes (Signed)
ANTICOAGULATION CONSULT NOTE - Follow Up Consult  Pharmacy Consult for heparin  Indication: chest pain/ACS , afib   Allergies  Allergen Reactions  . Butrans [Buprenorphine] Shortness Of Breath  . Influenza Vaccines Other (See Comments)    Severe coughing for weeks after vaccine; also had flu like symptoms for about 7 to 8 weeks.   . Lanoxin [Digoxin] Other (See Comments)    Nausea, anorexia, caused tremendous weight loss  . Ranexa [Ranolazine Er] Other (See Comments)    TOLD NEVER TO TAKE MED-PER MD MD put him back on this medication and is tolerating well  . Lisinopril Other (See Comments)    cough  . Clarithromycin Other (See Comments)    Unknown, thinks "blisters in mouth"  . Statins Other (See Comments)    unknown    Patient Measurements: Height: 6' (182.9 cm) Weight: 207 lb 8 oz (94.121 kg) IBW/kg (Calculated) : 77.6 Heparin Dosing Weight: 94kg  Vital Signs: Temp: 97.7 F (36.5 C) (03/17 1550) Temp Source: Oral (03/17 1550) BP: 96/62 mmHg (03/17 1753) Pulse Rate: 95 (03/17 1550)  Labs:  Recent Labs  02/27/16 1136 02/27/16 1800 02/28/16 0020 02/28/16 0621 02/28/16 1938  HGB 14.5  --   --   --   --   HCT 43.7  --   --   --   --   PLT 190  --   --   --   --   LABPROT 25.6*  --   --  19.7*  --   INR 2.37*  --   --  1.67*  --   HEPARINUNFRC  --   --   --   --  0.27*  CREATININE 1.99*  --   --  1.64*  --   TROPONINI  --  0.05* 0.05* 0.04*  --     Estimated Creatinine Clearance: 45.6 mL/min (by C-G formula based on Cr of 1.64).   Medications:  Scheduled:  . aspirin  81 mg Oral Pre-Cath  . aspirin EC  81 mg Oral Daily  . atorvastatin  40 mg Oral Daily  . fluticasone  2 spray Each Nare Daily  . hydrALAZINE  37.5 mg Oral TID  . insulin aspart  10 Units Subcutaneous BID AC  . insulin glargine  32 Units Subcutaneous q morning - 10a  . isosorbide mononitrate  120 mg Oral Daily  . levothyroxine  175 mcg Oral QAC breakfast  . metoprolol succinate  150 mg  Oral BID  . pantoprazole  40 mg Oral Daily  . predniSONE  5 mg Oral BID  . ranolazine  1,000 mg Oral BID  . sodium chloride flush  3 mL Intravenous Q12H  . spironolactone  12.5 mg Oral Daily  . torsemide  40 mg Oral Daily   Infusions:  . sodium chloride    . heparin 1,100 Units/hr (02/28/16 3235)    Assessment: 75 yo male here with CP and with history of CAD w/ CABG. He was on coumadin PTA for afib and switched to heparin when INR < 2.0 (plans for cath). HL is currently sub-therapeutic at 0.27 on 1100 units/hr. No s/s of overt bleeding per RN.   Goal of Therapy:  Heparin level 0.3-0.7 units/ml Monitor platelets by anticoagulation protocol: Yes   Plan: -Increase heparin infusion to 1250 units/hr  -Heparin level in 8 hours and daily wth CBC daily -Will follow cath plans  Vinnie Level, PharmD., BCPS Clinical Pharmacist Pager [phone]

## 2016-02-28 NOTE — Progress Notes (Signed)
Inpatient Diabetes Program Recommendations  AACE/ADA: New Consensus Statement on Inpatient Glycemic Control (2015)  Target Ranges:  Prepandial:   less than 140 mg/dL      Peak postprandial:   less than 180 mg/dL (1-2 hours)      Critically ill patients:  140 - 180 mg/dL   Review of Glycemic Control:  Results for Daniel, [name] (MRN [id number]) as of 02/28/2016 11:30  Ref. Range 02/27/2016 18:15 02/27/2016 21:03 02/28/2016 06:11  Glucose-Capillary Latest Ref Range: 65-99 mg/dL 177 (H) 939 (H) 030 (H)   Diabetes history: Diabetes mellitus Outpatient Diabetes medications: Humalog 10 units bid, Toujeo 32 units q AM Current orders for Inpatient glycemic control:  Lantus 32 units daily, Novolog 10 units bid with meals  Inpatient Diabetes Program Recommendations:   Please consider adding Novolog sensitive correction tid with meals and HS  Thanks, Beryl Meager, RN, BC-ADM Inpatient Diabetes Coordinator Pager [phone] (8a-5p

## 2016-02-28 NOTE — Telephone Encounter (Signed)
Pt wife called pt is in the Hospital now, she needs to speak to [facility] about a heart cath possibly being scheduled for Monday , pt wife wants a second opinion before doing this heart cath.. Please advise

## 2016-02-29 DIAGNOSIS — I214 Non-ST elevation (NSTEMI) myocardial infarction: Secondary | ICD-10-CM

## 2016-02-29 DIAGNOSIS — N183 Chronic kidney disease, stage 3 (moderate): Secondary | ICD-10-CM

## 2016-02-29 DIAGNOSIS — I5022 Chronic systolic (congestive) heart failure: Secondary | ICD-10-CM

## 2016-02-29 LAB — CBC
HEMATOCRIT: 37.9 % — AB (ref 39.0–52.0)
HEMOGLOBIN: 12.2 g/dL — AB (ref 13.0–17.0)
MCH: 31.9 pg (ref 26.0–34.0)
MCHC: 32.2 g/dL (ref 30.0–36.0)
MCV: 99 fL (ref 78.0–100.0)
Platelets: 152 10*3/uL (ref 150–400)
RBC: 3.83 MIL/uL — ABNORMAL LOW (ref 4.22–5.81)
RDW: 15 % (ref 11.5–15.5)
WBC: 10.5 10*3/uL (ref 4.0–10.5)

## 2016-02-29 LAB — GLUCOSE, CAPILLARY
GLUCOSE-CAPILLARY: 129 mg/dL — AB (ref 65–99)
Glucose-Capillary: 128 mg/dL — ABNORMAL HIGH (ref 65–99)
Glucose-Capillary: 202 mg/dL — ABNORMAL HIGH (ref 65–99)
Glucose-Capillary: 219 mg/dL — ABNORMAL HIGH (ref 65–99)

## 2016-02-29 LAB — HEPARIN LEVEL (UNFRACTIONATED)
HEPARIN UNFRACTIONATED: 0.31 [IU]/mL (ref 0.30–0.70)
HEPARIN UNFRACTIONATED: 0.39 [IU]/mL (ref 0.30–0.70)

## 2016-02-29 LAB — PROTIME-INR
INR: 1.47 (ref 0.00–1.49)
Prothrombin Time: 17.9 seconds — ABNORMAL HIGH (ref 11.6–15.2)

## 2016-02-29 NOTE — Progress Notes (Signed)
ANTICOAGULATION CONSULT NOTE - Follow Up Consult  Pharmacy Consult for Heparin  Indication: chest pain/ACS  Allergies  Allergen Reactions  . Butrans [Buprenorphine] Shortness Of Breath  . Influenza Vaccines Other (See Comments)    Severe coughing for weeks after vaccine; also had flu like symptoms for about 7 to 8 weeks.   . Lanoxin [Digoxin] Other (See Comments)    Nausea, anorexia, caused tremendous weight loss  . Ranexa [Ranolazine Er] Other (See Comments)    TOLD NEVER TO TAKE MED-PER MD MD put him back on this medication and is tolerating well  . Lisinopril Other (See Comments)    cough  . Clarithromycin Other (See Comments)    Unknown, thinks "blisters in mouth"  . Statins Other (See Comments)    unknown    Patient Measurements: Height: 6' (182.9 cm) Weight: 204 lb 11.2 oz (92.851 kg) IBW/kg (Calculated) : 77.6  Vital Signs: Temp: 98.3 F (36.8 C) (03/18 1000) Temp Source: Oral (03/18 0540) BP: 101/64 mmHg (03/18 1000) Pulse Rate: 84 (03/18 1000)  Labs:  Recent Labs  02/27/16 1136 02/27/16 1800 02/28/16 0020 02/28/16 0621 02/28/16 1938 02/29/16 0509 02/29/16 1156  HGB 14.5  --   --   --   --  12.2*  --   HCT 43.7  --   --   --   --  37.9*  --   PLT 190  --   --   --   --  152  --   LABPROT 25.6*  --   --  19.7*  --  17.9*  --   INR 2.37*  --   --  1.67*  --  1.47  --   HEPARINUNFRC  --   --   --   --  0.27* 0.31 0.39  CREATININE 1.99*  --   --  1.64*  --   --   --   TROPONINI  --  0.05* 0.05* 0.04*  --   --   --     Estimated Creatinine Clearance: 42.1 mL/min (by C-G formula based on Cr of 1.64).  Assessment: 75 yo male here with CP and with history of CAD w/ CABG. He was on coumadin PTA for afib and switched to heparin when INR < 2.0 (plans for cath Monday 3/20).  HL therapeutic at 0.39, Hgb 12.2, Plts 152, No s/sx bleeding noted.   Goal of Therapy:  Heparin level 0.3-0.7 units/ml Monitor platelets by anticoagulation protocol: Yes   Plan:   -Cont heparin at 1250 units/hr -Daily HL and CBC -Monitor for s/sx bleeding  Kelsy E Combs 02/29/2016,1:16 PM

## 2016-02-29 NOTE — Progress Notes (Signed)
ANTICOAGULATION CONSULT NOTE - Follow Up Consult  Pharmacy Consult for Heparin  Indication: chest pain/ACS  Allergies  Allergen Reactions  . Butrans [Buprenorphine] Shortness Of Breath  . Influenza Vaccines Other (See Comments)    Severe coughing for weeks after vaccine; also had flu like symptoms for about 7 to 8 weeks.   . Lanoxin [Digoxin] Other (See Comments)    Nausea, anorexia, caused tremendous weight loss  . Ranexa [Ranolazine Er] Other (See Comments)    TOLD NEVER TO TAKE MED-PER MD MD put him back on this medication and is tolerating well  . Lisinopril Other (See Comments)    cough  . Clarithromycin Other (See Comments)    Unknown, thinks "blisters in mouth"  . Statins Other (See Comments)    unknown    Patient Measurements: Height: 6' (182.9 cm) Weight: 204 lb 11.2 oz (92.851 kg) IBW/kg (Calculated) : 77.6  Vital Signs: Temp: 98 F (36.7 C) (03/18 0540) Temp Source: Oral (03/18 0540) BP: 101/74 mmHg (03/18 0540) Pulse Rate: 78 (03/18 0540)  Labs:  Recent Labs  02/27/16 1136 02/27/16 1800 02/28/16 0020 02/28/16 0621 02/28/16 1938 02/29/16 0509  HGB 14.5  --   --   --   --  12.2*  HCT 43.7  --   --   --   --  37.9*  PLT 190  --   --   --   --  152  LABPROT 25.6*  --   --  19.7*  --  17.9*  INR 2.37*  --   --  1.67*  --  1.47  HEPARINUNFRC  --   --   --   --  0.27* 0.31  CREATININE 1.99*  --   --  1.64*  --   --   TROPONINI  --  0.05* 0.05* 0.04*  --   --     Estimated Creatinine Clearance: 42.1 mL/min (by C-G formula based on Cr of 1.64).  Assessment: Heparin level therapeutic x 1 after rate increase  Goal of Therapy:  Heparin level 0.3-0.7 units/ml Monitor platelets by anticoagulation protocol: Yes   Plan:  -Cont heparin at 1250 units/hr -1200 HL  Abran Duke 02/29/2016,6:25 AM

## 2016-02-29 NOTE — Progress Notes (Signed)
Patient ID: [name], male   DOB: 11-05-1941, 75 y.o.   MRN: [id number]   SUBJECTIVE: No further chest pain today, but has not moved around much.   Scheduled Meds: . aspirin EC  81 mg Oral Daily  . atorvastatin  40 mg Oral Daily  . fluticasone  2 spray Each Nare Daily  . hydrALAZINE  37.5 mg Oral TID  . insulin aspart  10 Units Subcutaneous BID AC  . insulin glargine  32 Units Subcutaneous q morning - 10a  . isosorbide mononitrate  120 mg Oral Daily  . levothyroxine  175 mcg Oral QAC breakfast  . metoprolol succinate  150 mg Oral BID  . pantoprazole  40 mg Oral Daily  . predniSONE  5 mg Oral BID  . ranolazine  1,000 mg Oral BID  . sodium chloride flush  3 mL Intravenous Q12H  . spironolactone  12.5 mg Oral Daily  . torsemide  40 mg Oral Daily   Continuous Infusions: . sodium chloride    . heparin 1,250 Units/hr (02/29/16 0320)   PRN Meds:.sodium chloride, acetaminophen, nitroGLYCERIN, ondansetron (ZOFRAN) IV, oxyCODONE-acetaminophen, polyethylene glycol, sodium chloride flush    Filed Vitals:   02/28/16 2300 02/29/16 0051 02/29/16 0540 02/29/16 1000  BP: 111/61 111/67 101/74 101/64  Pulse: 85 42 78 84  Temp: 98.3 F (36.8 C) 97.6 F (36.4 C) 98 F (36.7 C) 98.3 F (36.8 C)  TempSrc: Oral Oral Oral   Resp: 18 20 20 20   Height:      Weight:   204 lb 11.2 oz (92.851 kg)   SpO2: 98% 99% 99% 99%    Intake/Output Summary (Last 24 hours) at 02/29/16 1357 Last data filed at 02/29/16 0900  Gross per 24 hour  Intake    580 ml  Output   1700 ml  Net  -1120 ml    LABS: Basic Metabolic Panel:  Recent Labs  03/02/16 1136 02/28/16 0621  NA 135 133*  K 5.7* 4.6  CL 99* 98*  CO2 18* 26  GLUCOSE 233* 130*  BUN 27* 27*  CREATININE 1.99* 1.64*  CALCIUM 9.3 9.1   Liver Function Tests: No results for input(s): AST, ALT, ALKPHOS, BILITOT, PROT, ALBUMIN in the last 72 hours. No results for input(s): LIPASE, AMYLASE in the last 72 hours. CBC:  Recent Labs  02/27/16 1136 02/29/16 0509  WBC 14.7* 10.5  HGB 14.5 12.2*  HCT 43.7 37.9*  MCV 100.2* 99.0  PLT 190 152   Cardiac Enzymes:  Recent Labs  02/27/16 1800 02/28/16 0020 02/28/16 0621  TROPONINI 0.05* 0.05* 0.04*   BNP: Invalid input(s): POCBNP D-Dimer: No results for input(s): DDIMER in the last 72 hours. Hemoglobin A1C: No results for input(s): HGBA1C in the last 72 hours. Fasting Lipid Panel:  Recent Labs  02/28/16 0617  CHOL 104  HDL 31*  LDLCALC 54  TRIG 94  CHOLHDL 3.4   Thyroid Function Tests: No results for input(s): TSH, T4TOTAL, T3FREE, THYROIDAB in the last 72 hours.  Invalid input(s): FREET3 Anemia Panel: No results for input(s): VITAMINB12, FOLATE, FERRITIN, TIBC, IRON, RETICCTPCT in the last 72 hours.  RADIOLOGY: Dg Chest 2 View  02/27/2016  CLINICAL DATA:  Left chest pain for 2 months EXAM: CHEST  2 VIEW COMPARISON:  May 16, 2015 FINDINGS: The heart size and mediastinal contours are stable. The heart size is enlarged. Patient is status post prior CABG and median sternotomy. Cardiac pacemaker is unchanged. Both lungs are clear. The visualized skeletal structures are  unremarkable. IMPRESSION: No active cardiopulmonary disease. Electronically Signed   By: Sherian Rein M.D.   On: 02/27/2016 14:06    PHYSICAL EXAM General: NAD Neck: JVP 7-8 cm, no thyromegaly or thyroid nodule.  Lungs: Decreased breath sounds left base, occasional rhonchi. CV: Nondisplaced PMI.  Heart regular S1/S2, no S3/S4, no murmur.  No peripheral edema.  No carotid bruit.  Normal pedal pulses.  Abdomen: Soft, nontender, no hepatosplenomegaly, no distention.  Neurologic: Alert and oriented x 3.  Psych: Normal affect. Extremities: No clubbing or cyanosis.   TELEMETRY: Reviewed telemetry pt in atrial fibrillation with controlled rate.  ASSESSMENT AND PLAN: 75 yo with severe CAD s/p CABG, ischemic cardiomyopathy/chronic systolic CHF, St Jude CRT-D, chronic atrial fibrillation,  rheumatoid arthritis on chronic steroids, and severe spinal stenosis. He has a chronically elevated left hemidiaphragm.  He was admitted with multiple CP episodes over 48 hours and use of 20 NTG over that period.  Mild TnI elevation concerning for NSTEMI.  1. CAD: Known severe CAD.  Last cath in 2015 showed occlusion of SVG-PDA and PDA itself but patent SVG-OM and LIMA-LAD.  He has had a pattern of chronic stable angina long-term.  Cardiolite in 12/16 showed infarction without ischemia.  He has had worsening chest pain episodes over about 2 wks, culminating with 2 days of chest pain on and off continuously. He took up to 20 NTG.  Came to ER => very mild troponin elevation to 0.05.   - Continue ASA 81, statin, and heparin gtt (INR subtherapeutic now).  - He is on maximal medical management for angina, continue Toprol XL, ranolazine, and Imdur.  - I had a long discussion with patient and family today.  I agree that he should have cardiac catheterization on Monday with considerably worsened CP and concern for NSTEMI.  Will need to be careful with CKD, minimize contrast.  Will hold torsemide day of cath and hydrate gently.  2. Chronic systolic CHF: EF 98-92% by echo in 2/16.  Ischemic cardiomyopathy.  St Jude CRT-D.  He does not look particularly volume overloaded today though BNP up.  - Continue po torsemide for now, hold am of cath.  - Continue Toprol XL and hydralazine/imdur.  - No ACEI with CKD. - Repeat echo, no LV-gram with cath.  3. CKD: Creatinine down to 1.6, this is around his baseline.  Limit contrast with cath and hold torsemide day of cath.  Gentle hydration day of cath.  4. Chronic atrial fibrillation: heparin gtt while off coumadin.  5. RA: chronic prednisone.   Marca Ancona 02/29/2016 2:07 PM

## 2016-03-01 ENCOUNTER — Encounter (HOSPITAL_COMMUNITY): Payer: Self-pay | Admitting: Rehabilitation

## 2016-03-01 ENCOUNTER — Inpatient Hospital Stay (HOSPITAL_COMMUNITY): Payer: PPO

## 2016-03-01 DIAGNOSIS — N183 Chronic kidney disease, stage 3 unspecified: Secondary | ICD-10-CM | POA: Diagnosis present

## 2016-03-01 DIAGNOSIS — I5023 Acute on chronic systolic (congestive) heart failure: Secondary | ICD-10-CM

## 2016-03-01 DIAGNOSIS — I501 Left ventricular failure: Secondary | ICD-10-CM

## 2016-03-01 DIAGNOSIS — N179 Acute kidney failure, unspecified: Secondary | ICD-10-CM | POA: Diagnosis present

## 2016-03-01 LAB — HEPARIN LEVEL (UNFRACTIONATED): HEPARIN UNFRACTIONATED: 0.43 [IU]/mL (ref 0.30–0.70)

## 2016-03-01 LAB — PROTIME-INR
INR: 1.31 (ref 0.00–1.49)
PROTHROMBIN TIME: 16.5 s — AB (ref 11.6–15.2)

## 2016-03-01 LAB — ECHOCARDIOGRAM COMPLETE
Height: 72 in
Weight: 3288 oz

## 2016-03-01 LAB — BASIC METABOLIC PANEL
Anion gap: 10 (ref 5–15)
BUN: 27 mg/dL — AB (ref 6–20)
CO2: 28 mmol/L (ref 22–32)
Calcium: 9 mg/dL (ref 8.9–10.3)
Chloride: 96 mmol/L — ABNORMAL LOW (ref 101–111)
Creatinine, Ser: 1.8 mg/dL — ABNORMAL HIGH (ref 0.61–1.24)
GFR calc Af Amer: 40 mL/min — ABNORMAL LOW (ref >60)
GFR, EST NON AFRICAN AMERICAN: 35 mL/min — AB (ref >60)
GLUCOSE: 168 mg/dL — AB (ref 65–99)
POTASSIUM: 4.3 mmol/L (ref 3.5–5.1)
Sodium: 134 mmol/L — ABNORMAL LOW (ref 135–145)

## 2016-03-01 LAB — GLUCOSE, CAPILLARY
GLUCOSE-CAPILLARY: 140 mg/dL — AB (ref 65–99)
GLUCOSE-CAPILLARY: 214 mg/dL — AB (ref 65–99)
GLUCOSE-CAPILLARY: 155 mg/dL — AB (ref 65–99)
Glucose-Capillary: 179 mg/dL — ABNORMAL HIGH (ref 65–99)

## 2016-03-01 LAB — CBC
HCT: 38.8 % — ABNORMAL LOW (ref 39.0–52.0)
Hemoglobin: 12.7 g/dL — ABNORMAL LOW (ref 13.0–17.0)
MCH: 32.3 pg (ref 26.0–34.0)
MCHC: 32.7 g/dL (ref 30.0–36.0)
MCV: 98.7 fL (ref 78.0–100.0)
PLATELETS: 180 10*3/uL (ref 150–400)
RBC: 3.93 MIL/uL — ABNORMAL LOW (ref 4.22–5.81)
RDW: 14.9 % (ref 11.5–15.5)
WBC: 10.8 10*3/uL — ABNORMAL HIGH (ref 4.0–10.5)

## 2016-03-01 NOTE — Progress Notes (Addendum)
ANTICOAGULATION CONSULT NOTE - Follow Up Consult  Pharmacy Consult for Heparin  Indication: chest pain/ACS/Afib  Allergies  Allergen Reactions  . Butrans [Buprenorphine] Shortness Of Breath  . Influenza Vaccines Other (See Comments)    Severe coughing for weeks after vaccine; also had flu like symptoms for about 7 to 8 weeks.   . Lanoxin [Digoxin] Other (See Comments)    Nausea, anorexia, caused tremendous weight loss  . Ranexa [Ranolazine Er] Other (See Comments)    TOLD NEVER TO TAKE MED-PER MD MD put him back on this medication and is tolerating well  . Lisinopril Other (See Comments)    cough  . Clarithromycin Other (See Comments)    Unknown, thinks "blisters in mouth"  . Statins Other (See Comments)    unknown    Patient Measurements: Height: 6' (182.9 cm) Weight: 205 lb 8 oz (93.214 kg) IBW/kg (Calculated) : 77.6  Vital Signs: Temp: 97.3 F (36.3 C) (03/19 0541) Temp Source: Oral (03/19 0541) BP: 106/59 mmHg (03/19 0940) Pulse Rate: 77 (03/19 0940)  Labs:  Recent Labs  02/27/16 1136 02/27/16 1800 02/28/16 0020 02/28/16 0621  02/29/16 0509 02/29/16 1156 03/01/16 0254  HGB 14.5  --   --   --   --  12.2*  --  12.7*  HCT 43.7  --   --   --   --  37.9*  --  38.8*  PLT 190  --   --   --   --  152  --  180  LABPROT 25.6*  --   --  19.7*  --  17.9*  --  16.5*  INR 2.37*  --   --  1.67*  --  1.47  --  1.31  HEPARINUNFRC  --   --   --   --   < > 0.31 0.39 0.43  CREATININE 1.99*  --   --  1.64*  --   --   --  1.80*  TROPONINI  --  0.05* 0.05* 0.04*  --   --   --   --   < > = values in this interval not displayed.  Estimated Creatinine Clearance: 41.4 mL/min (by C-G formula based on Cr of 1.8).  Assessment: 75 yo male here with CP and with history of CAD w/ CABG. He was on coumadin PTA for afib and switched to heparin when INR < 2.0 (plans for cath Monday 3/20).  HL therapeutic at 0.43, Hgb 12.7, Plts 180, No s/sx bleeding noted.   PTA coumadin dose is 5 mg  daily except 7.5 mg every TTSu,   Last taken 02/27/16.    Goal of Therapy:  Heparin level 0.3-0.7 units/ml Monitor platelets by anticoagulation protocol: Yes   Plan:  -Cont heparin at 1250 units/hr -Continue to Hold Warfarin  -Daily HL and CBC -Monitor for s/sx bleeding  Hazle Nordmann, PharmD Pharmacy Resident [phone]

## 2016-03-01 NOTE — Progress Notes (Signed)
Patient ID: [name], male   DOB: 25-Oct-1941, 75 y.o.   MRN: [id number]   SUBJECTIVE:  No chest pain today. Troponin flat - mildly positive, on IV Heparin. 1.4L negative overnight (total 3.7L negative). Breathing is unchanged. Echo pending today.  Scheduled Meds: . aspirin EC  81 mg Oral Daily  . atorvastatin  40 mg Oral Daily  . fluticasone  2 spray Each Nare Daily  . hydrALAZINE  37.5 mg Oral TID  . insulin aspart  10 Units Subcutaneous BID AC  . insulin glargine  32 Units Subcutaneous q morning - 10a  . isosorbide mononitrate  120 mg Oral Daily  . levothyroxine  175 mcg Oral QAC breakfast  . metoprolol succinate  150 mg Oral BID  . pantoprazole  40 mg Oral Daily  . predniSONE  5 mg Oral BID  . ranolazine  1,000 mg Oral BID  . sodium chloride flush  3 mL Intravenous Q12H  . spironolactone  12.5 mg Oral Daily  . torsemide  40 mg Oral Daily   Continuous Infusions: . sodium chloride    . heparin 1,250 Units/hr (03/01/16 0121)   PRN Meds:.sodium chloride, acetaminophen, nitroGLYCERIN, ondansetron (ZOFRAN) IV, oxyCODONE-acetaminophen, polyethylene glycol, sodium chloride flush    Filed Vitals:   02/29/16 1000 02/29/16 1300 02/29/16 1940 03/01/16 0541  BP: 101/64 101/58 114/91 131/89  Pulse: 84 80 103 74  Temp: 98.3 F (36.8 C) 97.8 F (36.6 C) 97.7 F (36.5 C) 97.3 F (36.3 C)  TempSrc:  Oral Oral Oral  Resp: 20 20 20 20   Height:      Weight:    205 lb 8 oz (93.214 kg)  SpO2: 99% 99% 100% 96%    Intake/Output Summary (Last 24 hours) at 03/01/16 0858 Last data filed at 03/01/16 0700  Gross per 24 hour  Intake    840 ml  Output   2050 ml  Net  -1210 ml    LABS: Basic Metabolic Panel:  Recent Labs  03/03/16 0621 03/01/16 0254  NA 133* 134*  K 4.6 4.3  CL 98* 96*  CO2 26 28  GLUCOSE 130* 168*  BUN 27* 27*  CREATININE 1.64* 1.80*  CALCIUM 9.1 9.0   Liver Function Tests: No results for input(s): AST, ALT, ALKPHOS, BILITOT, PROT, ALBUMIN in the last  72 hours. No results for input(s): LIPASE, AMYLASE in the last 72 hours. CBC:  Recent Labs  02/29/16 0509 03/01/16 0254  WBC 10.5 10.8*  HGB 12.2* 12.7*  HCT 37.9* 38.8*  MCV 99.0 98.7  PLT 152 180   Cardiac Enzymes:  Recent Labs  02/27/16 1800 02/28/16 0020 02/28/16 0621  TROPONINI 0.05* 0.05* 0.04*   BNP: Invalid input(s): POCBNP D-Dimer: No results for input(s): DDIMER in the last 72 hours. Hemoglobin A1C: No results for input(s): HGBA1C in the last 72 hours. Fasting Lipid Panel:  Recent Labs  02/28/16 0617  CHOL 104  HDL 31*  LDLCALC 54  TRIG 94  CHOLHDL 3.4   Thyroid Function Tests: No results for input(s): TSH, T4TOTAL, T3FREE, THYROIDAB in the last 72 hours.  Invalid input(s): FREET3 Anemia Panel: No results for input(s): VITAMINB12, FOLATE, FERRITIN, TIBC, IRON, RETICCTPCT in the last 72 hours.  RADIOLOGY: Dg Chest 2 View  02/27/2016  CLINICAL DATA:  Left chest pain for 2 months EXAM: CHEST  2 VIEW COMPARISON:  May 16, 2015 FINDINGS: The heart size and mediastinal contours are stable. The heart size is enlarged. Patient is status post prior CABG and median  sternotomy. Cardiac pacemaker is unchanged. Both lungs are clear. The visualized skeletal structures are unremarkable. IMPRESSION: No active cardiopulmonary disease. Electronically Signed   By: Sherian Rein M.D.   On: 02/27/2016 14:06    PHYSICAL EXAM General: NAD Neck: JVP 4 cm, no thyromegaly or thyroid nodule.  Lungs: Decreased breath sounds left base, occasional rhonchi. CV: Nondisplaced PMI.  Heart regular S1/S2, no S3/S4, no murmur.  No peripheral edema.  No carotid bruit.  Normal pedal pulses.  Abdomen: Soft, nontender, no hepatosplenomegaly, no distention.  Neurologic: Alert and oriented x 3.  Psych: Normal affect. Extremities: No clubbing or cyanosis. Trace edema.  TELEMETRY:  A-fib with CVR  ASSESSMENT AND PLAN: 75 yo with severe CAD s/p CABG, ischemic cardiomyopathy/chronic  systolic CHF, St Jude CRT-D, chronic atrial fibrillation, rheumatoid arthritis on chronic steroids, and severe spinal stenosis. He has a chronically elevated left hemidiaphragm.  He was admitted with multiple CP episodes over 48 hours and use of 20 NTG over that period.  Mild TnI elevation concerning for NSTEMI.   1. CAD: Known severe CAD.  Last cath in 2015 showed occlusion of SVG-PDA and PDA itself but patent SVG-OM and LIMA-LAD.  He has had a pattern of chronic stable angina long-term.  Cardiolite in 12/16 showed infarction without ischemia.  He has had worsening chest pain episodes over about 2 wks, culminating with 2 days of chest pain on and off continuously. He took up to 20 NTG.  Came to ER => very mild troponin elevation to 0.05 which has been flat.    - Continue ASA 81, statin, and heparin gtt (INR subtherapeutic now).   - He is on maximal medical management for angina, continue Toprol XL, ranolazine, and Imdur.   - Plan for LHC with Dr. [name] at 1 pm Monday - orders placed 2. Chronic systolic CHF: EF 78-29% by echo in 2/16.  Ischemic cardiomyopathy.  St Jude CRT-D.  He does not look particularly volume overloaded today though BNP up.   - Diuresed 1.4L negative overnight. Creatinine up to 1.8 today. Hold additional torsemide.  - Continue Toprol XL and hydralazine/imdur.   - No ACEI with CKD.   - Repeat echo performed today - read is pending. 3. CKD: Creatinine 1.6 - up to 1.8 overnight. Hold diuretics. Gentle hydration tomorrow am.   4. Chronic atrial fibrillation: heparin gtt while off coumadin.  5. RA: chronic prednisone.   NPO p MN for cath tomorrow - pending stable renal function.  [name] [facility] 03/01/2016 8:58 AM

## 2016-03-02 ENCOUNTER — Encounter (HOSPITAL_COMMUNITY)
Admission: EM | Disposition: A | Discharge status: Home / Self Care | Payer: Self-pay | Source: Home / Self Care | Attending: Cardiovascular Disease

## 2016-03-02 ENCOUNTER — Encounter (HOSPITAL_COMMUNITY): Payer: Self-pay | Admitting: Cardiovascular Disease

## 2016-03-02 DIAGNOSIS — I25110 Atherosclerotic heart disease of native coronary artery with unstable angina pectoris: Principal | ICD-10-CM

## 2016-03-02 HISTORY — PX: CARDIAC CATHETERIZATION: SHX172

## 2016-03-02 LAB — PROTIME-INR
INR: 1.33 (ref 0.00–1.49)
PROTHROMBIN TIME: 16.6 s — AB (ref 11.6–15.2)

## 2016-03-02 LAB — CBC
HCT: 38.8 % — ABNORMAL LOW (ref 39.0–52.0)
Hemoglobin: 12.5 g/dL — ABNORMAL LOW (ref 13.0–17.0)
MCH: 32.1 pg (ref 26.0–34.0)
MCHC: 32.2 g/dL (ref 30.0–36.0)
MCV: 99.7 fL (ref 78.0–100.0)
PLATELETS: 188 10*3/uL (ref 150–400)
RBC: 3.89 MIL/uL — ABNORMAL LOW (ref 4.22–5.81)
RDW: 15 % (ref 11.5–15.5)
WBC: 11.8 10*3/uL — AB (ref 4.0–10.5)

## 2016-03-02 LAB — BASIC METABOLIC PANEL
ANION GAP: 9 (ref 5–15)
BUN: 23 mg/dL — ABNORMAL HIGH (ref 6–20)
CHLORIDE: 102 mmol/L (ref 101–111)
CO2: 23 mmol/L (ref 22–32)
Calcium: 8.9 mg/dL (ref 8.9–10.3)
Creatinine, Ser: 1.52 mg/dL — ABNORMAL HIGH (ref 0.61–1.24)
GFR calc non Af Amer: 43 mL/min — ABNORMAL LOW (ref >60)
GFR, EST AFRICAN AMERICAN: 50 mL/min — AB (ref >60)
GLUCOSE: 140 mg/dL — AB (ref 65–99)
Potassium: 5.1 mmol/L (ref 3.5–5.1)
Sodium: 134 mmol/L — ABNORMAL LOW (ref 135–145)

## 2016-03-02 LAB — GLUCOSE, CAPILLARY
GLUCOSE-CAPILLARY: 121 mg/dL — AB (ref 65–99)
Glucose-Capillary: 105 mg/dL — ABNORMAL HIGH (ref 65–99)
Glucose-Capillary: 124 mg/dL — ABNORMAL HIGH (ref 65–99)

## 2016-03-02 LAB — HEPARIN LEVEL (UNFRACTIONATED)
HEPARIN UNFRACTIONATED: 0.35 [IU]/mL (ref 0.30–0.70)
HEPARIN UNFRACTIONATED: 0.22 [IU]/mL — AB (ref 0.30–0.70)

## 2016-03-02 SURGERY — LEFT HEART CATH AND CORS/GRAFTS ANGIOGRAPHY

## 2016-03-02 MED ORDER — SODIUM CHLORIDE 0.9% FLUSH: 3.0000 mL | Freq: Two times a day (BID) | INTRAVENOUS | Status: DC

## 2016-03-02 MED ORDER — SODIUM CHLORIDE 0.9% FLUSH
3.0000 mL | Freq: Two times a day (BID) | INTRAVENOUS | Status: DC
  Administered 2016-03-02 – 2016-03-04 (×3): 3 mL via INTRAVENOUS

## 2016-03-02 MED ORDER — WARFARIN - PHARMACIST DOSING INPATIENT: Freq: Every day | Status: DC

## 2016-03-02 MED ORDER — SODIUM CHLORIDE 0.9 % WEIGHT BASED INFUSION
1.0000 mL/kg/h | INTRAVENOUS | Status: AC
Start: 2016-03-02 — End: 2016-03-02

## 2016-03-02 MED ORDER — ONDANSETRON HCL 4 MG/2ML IJ SOLN: 4.0000 mg | Freq: Four times a day (QID) | INTRAMUSCULAR | Status: DC | PRN

## 2016-03-02 MED ORDER — HEPARIN (PORCINE) IN NACL 2-0.9 UNIT/ML-% IJ SOLN
INTRAMUSCULAR | Status: AC
  Filled 2016-03-02: qty 1000

## 2016-03-02 MED ORDER — ASPIRIN 81 MG PO CHEW
81.0000 mg | CHEWABLE_TABLET | Freq: Every day | ORAL | Status: DC
  Administered 2016-03-03 – 2016-03-04 (×2): 81 mg via ORAL
  Filled 2016-03-02 (×2): qty 1

## 2016-03-02 MED ORDER — LIDOCAINE HCL (PF) 1 % IJ SOLN
INTRAMUSCULAR | Status: DC | PRN
  Administered 2016-03-02: 20 mL via SUBCUTANEOUS

## 2016-03-02 MED ORDER — LIDOCAINE HCL (PF) 1 % IJ SOLN
INTRAMUSCULAR | Status: AC
  Filled 2016-03-02: qty 30

## 2016-03-02 MED ORDER — ACETAMINOPHEN 325 MG PO TABS
650.0000 mg | ORAL_TABLET | ORAL | Status: DC | PRN
  Administered 2016-03-03: 650 mg via ORAL
  Filled 2016-03-02: qty 2

## 2016-03-02 MED ORDER — SODIUM CHLORIDE 0.9% FLUSH: 3.0000 mL | INTRAVENOUS | Status: DC | PRN

## 2016-03-02 MED ORDER — HEPARIN (PORCINE) IN NACL 2-0.9 UNIT/ML-% IJ SOLN
INTRAMUSCULAR | Status: DC | PRN
Start: 2016-03-02 — End: 2016-03-02
  Administered 2016-03-02: 1000 mL

## 2016-03-02 MED ORDER — IOHEXOL 350 MG/ML SOLN
INTRAVENOUS | Status: DC | PRN
  Administered 2016-03-02: 60 mL via INTRA_ARTERIAL

## 2016-03-02 MED ORDER — WARFARIN SODIUM 7.5 MG PO TABS
7.5000 mg | ORAL_TABLET | Freq: Once | ORAL | Status: AC
  Administered 2016-03-02: 7.5 mg via ORAL
  Filled 2016-03-02: qty 1

## 2016-03-02 MED ORDER — SODIUM CHLORIDE 0.9 % IV SOLN: INTRAVENOUS | Status: DC

## 2016-03-02 MED ORDER — SODIUM CHLORIDE 0.9 % IV SOLN: 250.0000 mL | INTRAVENOUS | Status: DC | PRN

## 2016-03-02 MED ORDER — HEPARIN (PORCINE) IN NACL 100-0.45 UNIT/ML-% IJ SOLN
1350.0000 [IU]/h | INTRAMUSCULAR | Status: DC
  Administered 2016-03-02: 1250 [IU]/h via INTRAVENOUS
  Administered 2016-03-03: 1350 [IU]/h via INTRAVENOUS
  Filled 2016-03-02 (×2): qty 250

## 2016-03-02 SURGICAL SUPPLY — 10 items
CATH INFINITI 5 FR IM (CATHETERS) ×2 IMPLANT
CATH INFINITI 5FR MULTPACK ANG (CATHETERS) ×2 IMPLANT
DEVICE CLOSURE MYNXGRIP 5F (Vascular Products) ×2 IMPLANT
HOVERMATT SINGLE USE (MISCELLANEOUS) ×2 IMPLANT
KIT HEART LEFT (KITS) ×3 IMPLANT
PACK CARDIAC CATHETERIZATION (CUSTOM PROCEDURE TRAY) ×3 IMPLANT
SHEATH PINNACLE 5F 10CM (SHEATH) ×2 IMPLANT
TRANSDUCER W/STOPCOCK (MISCELLANEOUS) ×3 IMPLANT
WIRE EMERALD 3MM-J .035X150CM (WIRE) ×2 IMPLANT
WIRE EMERALD 3MM-J .035X260CM (WIRE) ×2 IMPLANT

## 2016-03-02 NOTE — Progress Notes (Signed)
ANTICOAGULATION CONSULT NOTE  Pharmacy Consult for Heparin  Indication: Afib  Allergies  Allergen Reactions  . Butrans [Buprenorphine] Shortness Of Breath  . Influenza Vaccines Other (See Comments)    Severe coughing for weeks after vaccine; also had flu like symptoms for about 7 to 8 weeks.   . Lanoxin [Digoxin] Other (See Comments)    Nausea, anorexia, caused tremendous weight loss  . Ranexa [Ranolazine Er] Other (See Comments)    TOLD NEVER TO TAKE MED-PER MD MD put him back on this medication and is tolerating well  . Lisinopril Other (See Comments)    cough  . Clarithromycin Other (See Comments)    Unknown, thinks "blisters in mouth"  . Statins Other (See Comments)    unknown    Patient Measurements: Height: 6' (182.9 cm) Weight: 208 lb 3.2 oz (94.439 kg) (Scale C) IBW/kg (Calculated) : 77.6  Vital Signs: Temp: 97.7 F (36.5 C) (03/20 2052) Temp Source: Oral (03/20 2052) BP: 104/71 mmHg (03/20 2139) Pulse Rate: 86 (03/20 2139)  Labs:  Recent Labs  02/29/16 0509  03/01/16 0254 03/02/16 0246 03/02/16 0503 03/02/16 2211  HGB 12.2*  --  12.7* 12.5*  --   --   HCT 37.9*  --  38.8* 38.8*  --   --   PLT 152  --  180 188  --   --   LABPROT 17.9*  --  16.5* 16.6*  --   --   INR 1.47  --  1.31 1.33  --   --   HEPARINUNFRC 0.31  < > 0.43 0.35  --  0.22*  CREATININE  --   --  1.80*  --  1.52*  --   < > = values in this interval not displayed.  Estimated Creatinine Clearance: 49.3 mL/min (by C-G formula based on Cr of 1.52).  Assessment: 75 yo male with h/o Afib, s/p cath, for heparin  Goal of Therapy:  INR 2-3 Heparin level 0.3-0.7 units/ml Monitor platelets by anticoagulation protocol: Yes   Plan:  Increase Heparin 1350 units/hr Follow-up am labs.  Geannie Risen, PharmD, BCPS   03/02/2016 11:10 PM

## 2016-03-02 NOTE — Care Management Important Message (Signed)
Important Message  Patient Details  Name: [name] MRN: [id number] Date of Birth: [birth year]   Medicare Important Message Given:  Yes    Nazar Kuan P Versie Soave 03/02/2016, 3:35 PM

## 2016-03-02 NOTE — Interval H&P Note (Signed)
Cath Lab Visit (complete for each Cath Lab visit)  Clinical Evaluation Leading to the Procedure:   ACS: Yes.    Non-ACS:    Anginal Classification: CCS III  Anti-ischemic medical therapy: Maximal Therapy (2 or more classes of medications)  Non-Invasive Test Results: No non-invasive testing performed  Prior CABG: Previous CABG      History and Physical Interval Note:  03/02/2016 9:33 AM  [name]  has presented today for surgery, with the diagnosis of cp  The various methods of treatment have been discussed with the patient and family. After consideration of risks, benefits and other options for treatment, the patient has consented to  Procedure(s): Left Heart Cath and Cors/Grafts Angiography (N/A) as a surgical intervention .  The patient's history has been reviewed, patient examined, no change in status, stable for surgery.  I have reviewed the patient's chart and labs.  Questions were answered to the patient's satisfaction.     Nanetta Batty

## 2016-03-02 NOTE — Progress Notes (Signed)
Patient ID: [name], male   DOB: 08/19/1941, 74 y.o.   MRN: [id number]   SUBJECTIVE: No further chest pain today, but has not moved around much.  IV fluid started last night at 6 pm, weight is up 3 lbs.  No dyspnea at rest.   Scheduled Meds: . aspirin EC  81 mg Oral Daily  . atorvastatin  40 mg Oral Daily  . fluticasone  2 spray Each Nare Daily  . hydrALAZINE  37.5 mg Oral TID  . insulin aspart  10 Units Subcutaneous BID AC  . insulin glargine  32 Units Subcutaneous q morning - 10a  . isosorbide mononitrate  120 mg Oral Daily  . levothyroxine  175 mcg Oral QAC breakfast  . metoprolol succinate  150 mg Oral BID  . pantoprazole  40 mg Oral Daily  . predniSONE  5 mg Oral BID  . ranolazine  1,000 mg Oral BID  . sodium chloride flush  3 mL Intravenous Q12H  . sodium chloride flush  3 mL Intravenous Q12H   Continuous Infusions: . [START ON 03/03/2016] sodium chloride    . heparin 1,250 Units/hr (03/01/16 2304)   PRN Meds:.sodium chloride, sodium chloride, acetaminophen, nitroGLYCERIN, ondansetron (ZOFRAN) IV, oxyCODONE-acetaminophen, polyethylene glycol, sodium chloride flush, sodium chloride flush    Filed Vitals:   03/01/16 0940 03/01/16 1200 03/01/16 2015 03/02/16 0450  BP: 106/59 111/68 105/71 109/79  Pulse: 77 81 73 89  Temp:  98 F (36.7 C) 97.4 F (36.3 C) 98.2 F (36.8 C)  TempSrc:  Oral Oral Oral  Resp:  20 17 16  Height:      Weight:    208 lb 3.2 oz (94.439 kg)  SpO2:  98% 98% 97%    Intake/Output Summary (Last 24 hours) at 03/02/16 0821 Last data filed at 03/02/16 0739  Gross per 24 hour  Intake 2609.45 ml  Output   2476 ml  Net 133.45 ml    LABS: Basic Metabolic Panel:  Recent Labs  03/01/16 0254 03/02/16 0503  NA 134* 134*  K 4.3 5.1  CL 96* 102  CO2 28 23  GLUCOSE 168* 140*  BUN 27* 23*  CREATININE 1.80* 1.52*  CALCIUM 9.0 8.9   Liver Function Tests: No results for input(s): AST, ALT, ALKPHOS, BILITOT, PROT, ALBUMIN in the last 72  hours. No results for input(s): LIPASE, AMYLASE in the last 72 hours. CBC:  Recent Labs  03/01/16 0254 03/02/16 0246  WBC 10.8* 11.8*  HGB 12.7* 12.5*  HCT 38.8* 38.8*  MCV 98.7 99.7  PLT 180 188   Cardiac Enzymes: No results for input(s): CKTOTAL, CKMB, CKMBINDEX, TROPONINI in the last 72 hours. BNP: Invalid input(s): POCBNP D-Dimer: No results for input(s): DDIMER in the last 72 hours. Hemoglobin A1C: No results for input(s): HGBA1C in the last 72 hours. Fasting Lipid Panel: No results for input(s): CHOL, HDL, LDLCALC, TRIG, CHOLHDL, LDLDIRECT in the last 72 hours. Thyroid Function Tests: No results for input(s): TSH, T4TOTAL, T3FREE, THYROIDAB in the last 72 hours.  Invalid input(s): FREET3 Anemia Panel: No results for input(s): VITAMINB12, FOLATE, FERRITIN, TIBC, IRON, RETICCTPCT in the last 72 hours.  RADIOLOGY: Dg Chest 2 View  02/27/2016  CLINICAL DATA:  Left chest pain for 2 months EXAM: CHEST  2 VIEW COMPARISON:  May 16, 2015 FINDINGS: The heart size and mediastinal contours are stable. The heart size is enlarged. Patient is status post prior CABG and median sternotomy. Cardiac pacemaker is unchanged. Both lungs are clear. The visualized   skeletal structures are unremarkable. IMPRESSION: No active cardiopulmonary disease. Electronically Signed   By: Sherian Rein M.D.   On: 02/27/2016 14:06    PHYSICAL EXAM General: NAD Neck: JVP 9-10 cm, no thyromegaly or thyroid nodule.  Lungs: Decreased breath sounds left base, occasional rhonchi. CV: Nondisplaced PMI.  Heart regular S1/S2, no S3/S4, no murmur.  Trace ankle edema.  No carotid bruit.  Normal pedal pulses.  Abdomen: Soft, nontender, no hepatosplenomegaly, no distention.  Neurologic: Alert and oriented x 3.  Psych: Normal affect. Extremities: No clubbing or cyanosis.   TELEMETRY: Reviewed telemetry pt in atrial fibrillation with controlled rate.  ASSESSMENT AND PLAN: 75 yo with severe CAD s/p CABG, ischemic  cardiomyopathy/chronic systolic CHF, St Jude CRT-D, chronic atrial fibrillation, rheumatoid arthritis on chronic steroids, and severe spinal stenosis. He has a chronically elevated left hemidiaphragm.  He was admitted with multiple CP episodes over 48 hours and use of 20 NTG over that period.  Mild TnI elevation concerning for NSTEMI.  1. CAD: Known severe CAD.  Last cath in 2015 showed occlusion of SVG-PDA and PDA itself but patent SVG-OM and LIMA-LAD.  He has had a pattern of chronic stable angina long-term.  Cardiolite in 12/16 showed infarction without ischemia.  He has had worsening chest pain episodes over about 2 wks, culminating with 2 days of chest pain on and off continuously. He took up to 20 NTG.  Came to ER => very mild troponin elevation to 0.05 (?small NSTEMI).   - Continue ASA 81, statin, and heparin gtt.  - He is on maximal medical management for angina, continue Toprol XL, ranolazine, and Imdur.  - I had a long discussion with patient and family over the weekend.  I agree that he should have cardiac catheterization today with considerably worsened CP and concern for NSTEMI.  Will need to be careful with CKD, minimize contrast. We discussed risks/benefits of the procedure and he agrees to proceed.  2. Chronic systolic CHF: EF 16-10% with moderate MR by echo in this admission.  Ischemic cardiomyopathy.  St Jude CRT-D.  He got IV fluid starting last night at 6 pm and torsemide was held starting yesterday.  He has some volume overload on exam today and weight is up 3 lbs. - Continue to hold torsemide but will make IVF KVO for now until cath (already has had large sodium load).  - K 5.1 today off torsemide, will hold spironolactone.  Restart when we restart torsemide.  - Continue Toprol XL and hydralazine/imdur.  - No ACEI with CKD.  3. CKD: Creatinine down to 1.5, this is around his baseline.  Limit contrast with cath, torsemide on hold.  He got IV fluid overnight.   4. Chronic atrial  fibrillation: heparin gtt while off coumadin.  5. RA: chronic prednisone.   Marca Ancona 03/02/2016 8:21 AM

## 2016-03-02 NOTE — H&P (View-Only) (Signed)
Patient ID: [name], male   DOB: July 22, 1941, 75 y.o.   MRN: [id number]   SUBJECTIVE: No further chest pain today, but has not moved around much.  IV fluid started last night at 6 pm, weight is up 3 lbs.  No dyspnea at rest.   Scheduled Meds: . aspirin EC  81 mg Oral Daily  . atorvastatin  40 mg Oral Daily  . fluticasone  2 spray Each Nare Daily  . hydrALAZINE  37.5 mg Oral TID  . insulin aspart  10 Units Subcutaneous BID AC  . insulin glargine  32 Units Subcutaneous q morning - 10a  . isosorbide mononitrate  120 mg Oral Daily  . levothyroxine  175 mcg Oral QAC breakfast  . metoprolol succinate  150 mg Oral BID  . pantoprazole  40 mg Oral Daily  . predniSONE  5 mg Oral BID  . ranolazine  1,000 mg Oral BID  . sodium chloride flush  3 mL Intravenous Q12H  . sodium chloride flush  3 mL Intravenous Q12H   Continuous Infusions: . [START ON 03/03/2016] sodium chloride    . heparin 1,250 Units/hr (03/01/16 2304)   PRN Meds:.sodium chloride, sodium chloride, acetaminophen, nitroGLYCERIN, ondansetron (ZOFRAN) IV, oxyCODONE-acetaminophen, polyethylene glycol, sodium chloride flush, sodium chloride flush    Filed Vitals:   03/01/16 0940 03/01/16 1200 03/01/16 2015 03/02/16 0450  BP: 106/59 111/68 105/71 109/79  Pulse: 77 81 73 89  Temp:  98 F (36.7 C) 97.4 F (36.3 C) 98.2 F (36.8 C)  TempSrc:  Oral Oral Oral  Resp:  20 17 16   Height:      Weight:    208 lb 3.2 oz (94.439 kg)  SpO2:  98% 98% 97%    Intake/Output Summary (Last 24 hours) at 03/02/16 0821 Last data filed at 03/02/16 0739  Gross per 24 hour  Intake 2609.45 ml  Output   2476 ml  Net 133.45 ml    LABS: Basic Metabolic Panel:  Recent Labs  03/04/16 0254 03/02/16 0503  NA 134* 134*  K 4.3 5.1  CL 96* 102  CO2 28 23  GLUCOSE 168* 140*  BUN 27* 23*  CREATININE 1.80* 1.52*  CALCIUM 9.0 8.9   Liver Function Tests: No results for input(s): AST, ALT, ALKPHOS, BILITOT, PROT, ALBUMIN in the last 72  hours. No results for input(s): LIPASE, AMYLASE in the last 72 hours. CBC:  Recent Labs  03/01/16 0254 03/02/16 0246  WBC 10.8* 11.8*  HGB 12.7* 12.5*  HCT 38.8* 38.8*  MCV 98.7 99.7  PLT 180 188   Cardiac Enzymes: No results for input(s): CKTOTAL, CKMB, CKMBINDEX, TROPONINI in the last 72 hours. BNP: Invalid input(s): POCBNP D-Dimer: No results for input(s): DDIMER in the last 72 hours. Hemoglobin A1C: No results for input(s): HGBA1C in the last 72 hours. Fasting Lipid Panel: No results for input(s): CHOL, HDL, LDLCALC, TRIG, CHOLHDL, LDLDIRECT in the last 72 hours. Thyroid Function Tests: No results for input(s): TSH, T4TOTAL, T3FREE, THYROIDAB in the last 72 hours.  Invalid input(s): FREET3 Anemia Panel: No results for input(s): VITAMINB12, FOLATE, FERRITIN, TIBC, IRON, RETICCTPCT in the last 72 hours.  RADIOLOGY: Dg Chest 2 View  02/27/2016  CLINICAL DATA:  Left chest pain for 2 months EXAM: CHEST  2 VIEW COMPARISON:  May 16, 2015 FINDINGS: The heart size and mediastinal contours are stable. The heart size is enlarged. Patient is status post prior CABG and median sternotomy. Cardiac pacemaker is unchanged. Both lungs are clear. The visualized  skeletal structures are unremarkable. IMPRESSION: No active cardiopulmonary disease. Electronically Signed   By: Sherian Rein M.D.   On: 02/27/2016 14:06    PHYSICAL EXAM General: NAD Neck: JVP 9-10 cm, no thyromegaly or thyroid nodule.  Lungs: Decreased breath sounds left base, occasional rhonchi. CV: Nondisplaced PMI.  Heart regular S1/S2, no S3/S4, no murmur.  Trace ankle edema.  No carotid bruit.  Normal pedal pulses.  Abdomen: Soft, nontender, no hepatosplenomegaly, no distention.  Neurologic: Alert and oriented x 3.  Psych: Normal affect. Extremities: No clubbing or cyanosis.   TELEMETRY: Reviewed telemetry pt in atrial fibrillation with controlled rate.  ASSESSMENT AND PLAN: 75 yo with severe CAD s/p CABG, ischemic  cardiomyopathy/chronic systolic CHF, St Jude CRT-D, chronic atrial fibrillation, rheumatoid arthritis on chronic steroids, and severe spinal stenosis. He has a chronically elevated left hemidiaphragm.  He was admitted with multiple CP episodes over 48 hours and use of 20 NTG over that period.  Mild TnI elevation concerning for NSTEMI.  1. CAD: Known severe CAD.  Last cath in 2015 showed occlusion of SVG-PDA and PDA itself but patent SVG-OM and LIMA-LAD.  He has had a pattern of chronic stable angina long-term.  Cardiolite in 12/16 showed infarction without ischemia.  He has had worsening chest pain episodes over about 2 wks, culminating with 2 days of chest pain on and off continuously. He took up to 20 NTG.  Came to ER => very mild troponin elevation to 0.05 (?small NSTEMI).   - Continue ASA 81, statin, and heparin gtt.  - He is on maximal medical management for angina, continue Toprol XL, ranolazine, and Imdur.  - I had a long discussion with patient and family over the weekend.  I agree that he should have cardiac catheterization today with considerably worsened CP and concern for NSTEMI.  Will need to be careful with CKD, minimize contrast. We discussed risks/benefits of the procedure and he agrees to proceed.  2. Chronic systolic CHF: EF 16-10% with moderate MR by echo in this admission.  Ischemic cardiomyopathy.  St Jude CRT-D.  He got IV fluid starting last night at 6 pm and torsemide was held starting yesterday.  He has some volume overload on exam today and weight is up 3 lbs. - Continue to hold torsemide but will make IVF KVO for now until cath (already has had large sodium load).  - K 5.1 today off torsemide, will hold spironolactone.  Restart when we restart torsemide.  - Continue Toprol XL and hydralazine/imdur.  - No ACEI with CKD.  3. CKD: Creatinine down to 1.5, this is around his baseline.  Limit contrast with cath, torsemide on hold.  He got IV fluid overnight.   4. Chronic atrial  fibrillation: heparin gtt while off coumadin.  5. RA: chronic prednisone.   Marca Ancona 03/02/2016 8:21 AM

## 2016-03-02 NOTE — Progress Notes (Signed)
ANTICOAGULATION CONSULT NOTE - Follow Up Consult  Pharmacy Consult for Heparin  Indication: Afib  Allergies  Allergen Reactions  . Butrans [Buprenorphine] Shortness Of Breath  . Influenza Vaccines Other (See Comments)    Severe coughing for weeks after vaccine; also had flu like symptoms for about 7 to 8 weeks.   . Lanoxin [Digoxin] Other (See Comments)    Nausea, anorexia, caused tremendous weight loss  . Ranexa [Ranolazine Er] Other (See Comments)    TOLD NEVER TO TAKE MED-PER MD MD put him back on this medication and is tolerating well  . Lisinopril Other (See Comments)    cough  . Clarithromycin Other (See Comments)    Unknown, thinks "blisters in mouth"  . Statins Other (See Comments)    unknown    Patient Measurements: Height: 6' (182.9 cm) Weight: 208 lb 3.2 oz (94.439 kg) (Scale C) IBW/kg (Calculated) : 77.6  Vital Signs: Temp: 98.2 F (36.8 C) (03/20 0450) Temp Source: Oral (03/20 0450) BP: 119/87 mmHg (03/20 1007) Pulse Rate: 55 (03/20 1007)  Labs:  Recent Labs  02/29/16 0509 02/29/16 1156 03/01/16 0254 03/02/16 0246 03/02/16 0503  HGB 12.2*  --  12.7* 12.5*  --   HCT 37.9*  --  38.8* 38.8*  --   PLT 152  --  180 188  --   LABPROT 17.9*  --  16.5* 16.6*  --   INR 1.47  --  1.31 1.33  --   HEPARINUNFRC 0.31 0.39 0.43 0.35  --   CREATININE  --   --  1.80*  --  1.52*    Estimated Creatinine Clearance: 49.3 mL/min (by C-G formula based on Cr of 1.52).  Assessment: 75 yo male here with CP and with history of CAD w/ CABG. He was on warfarin PTA for afib and switched to heparin when INR < 2. He is s/p cath with unchanged anatomy since last cath - plan is for medical management. Pharmacy consulted to resume heparin drip 4 hrs post cath with no bolus and warfarin tonight. Sheath removed at 10:21.  HL therapeutic at 0.35 this morning, Hgb stable, Plts stable, No s/sx bleeding noted.   PTA warfarin dose is 5 mg daily except 7.5 mg every TTSu, last taken 3/16  PTA.   Goal of Therapy:  INR 2-3 Heparin level 0.3-0.7 units/ml Monitor platelets by anticoagulation protocol: Yes   Plan:  -Resume heparin drip at 1250 units/hr without bolus at 14:30 -Warfarin 7.5 mg PO tonight -8 hr HL -Daily HL, INR and CBC -Monitor for s/sx bleeding  [facility], Pharm.D., BCPS Clinical Pharmacist Pager: [phone] 03/02/2016 10:34 AM

## 2016-03-02 NOTE — Progress Notes (Signed)
Utilization review completed. Tiara Bartoli, RN, BSN. 

## 2016-03-03 LAB — BASIC METABOLIC PANEL
ANION GAP: 11 (ref 5–15)
BUN: 24 mg/dL — AB (ref 6–20)
CO2: 24 mmol/L (ref 22–32)
Calcium: 9.5 mg/dL (ref 8.9–10.3)
Chloride: 101 mmol/L (ref 101–111)
Creatinine, Ser: 1.4 mg/dL — ABNORMAL HIGH (ref 0.61–1.24)
GFR calc Af Amer: 55 mL/min — ABNORMAL LOW (ref >60)
GFR, EST NON AFRICAN AMERICAN: 47 mL/min — AB (ref >60)
Glucose, Bld: 128 mg/dL — ABNORMAL HIGH (ref 65–99)
POTASSIUM: 5.4 mmol/L — AB (ref 3.5–5.1)
SODIUM: 136 mmol/L (ref 135–145)

## 2016-03-03 LAB — CBC
HCT: 41.1 % (ref 39.0–52.0)
HEMOGLOBIN: 13.1 g/dL (ref 13.0–17.0)
MCH: 31.7 pg (ref 26.0–34.0)
MCHC: 31.9 g/dL (ref 30.0–36.0)
MCV: 99.5 fL (ref 78.0–100.0)
Platelets: 204 10*3/uL (ref 150–400)
RBC: 4.13 MIL/uL — ABNORMAL LOW (ref 4.22–5.81)
RDW: 15.2 % (ref 11.5–15.5)
WBC: 12 10*3/uL — ABNORMAL HIGH (ref 4.0–10.5)

## 2016-03-03 LAB — PROTIME-INR
INR: 1.19 (ref 0.00–1.49)
PROTHROMBIN TIME: 15.3 s — AB (ref 11.6–15.2)

## 2016-03-03 LAB — GLUCOSE, CAPILLARY
GLUCOSE-CAPILLARY: 213 mg/dL — AB (ref 65–99)
GLUCOSE-CAPILLARY: 207 mg/dL — AB (ref 65–99)
GLUCOSE-CAPILLARY: 209 mg/dL — AB (ref 65–99)
Glucose-Capillary: 114 mg/dL — ABNORMAL HIGH (ref 65–99)

## 2016-03-03 LAB — HEPARIN LEVEL (UNFRACTIONATED): HEPARIN UNFRACTIONATED: 0.46 [IU]/mL (ref 0.30–0.70)

## 2016-03-03 MED ORDER — FUROSEMIDE 10 MG/ML IJ SOLN
60.0000 mg | Freq: Two times a day (BID) | INTRAMUSCULAR | Status: DC
  Administered 2016-03-03 – 2016-03-04 (×3): 60 mg via INTRAVENOUS
  Filled 2016-03-03 (×3): qty 6

## 2016-03-03 MED ORDER — WARFARIN SODIUM 10 MG PO TABS
10.0000 mg | ORAL_TABLET | Freq: Once | ORAL | Status: AC
  Administered 2016-03-03: 10 mg via ORAL
  Filled 2016-03-03: qty 1

## 2016-03-03 NOTE — Progress Notes (Signed)
Patient ID: [name], male   DOB: 1941-11-14, 75 y.o.   MRN: [id number]   SUBJECTIVE: No further chest pain today, but has not moved around much.  He is more short of breath than usual, weight is up.   LHC (3/20) with patent LIMA-LAD/D1 and patent SVG-OM1, occluded SVG-PDA.  No change in anatomy from prior.    Scheduled Meds: . aspirin  81 mg Oral Daily  . atorvastatin  40 mg Oral Daily  . fluticasone  2 spray Each Nare Daily  . furosemide  60 mg Intravenous BID  . hydrALAZINE  37.5 mg Oral TID  . insulin aspart  10 Units Subcutaneous BID AC  . insulin glargine  32 Units Subcutaneous q morning - 10a  . isosorbide mononitrate  120 mg Oral Daily  . levothyroxine  175 mcg Oral QAC breakfast  . metoprolol succinate  150 mg Oral BID  . pantoprazole  40 mg Oral Daily  . predniSONE  5 mg Oral BID  . ranolazine  1,000 mg Oral BID  . sodium chloride flush  3 mL Intravenous Q12H  . Warfarin - Pharmacist Dosing Inpatient   Does not apply q1800   Continuous Infusions: . heparin 1,350 Units/hr (03/02/16 2317)   PRN Meds:.sodium chloride, acetaminophen, nitroGLYCERIN, ondansetron (ZOFRAN) IV, oxyCODONE-acetaminophen, polyethylene glycol, sodium chloride flush    Filed Vitals:   03/02/16 2139 03/03/16 0116 03/03/16 0540 03/03/16 0730  BP: 104/71 120/79 113/91   Pulse: 86 72 76   Temp:  97.1 F (36.2 C) 98.1 F (36.7 C)   TempSrc:  Oral Oral   Resp:  18 18   Height:      Weight:    206 lb (93.441 kg)  SpO2:  96% 98%     Intake/Output Summary (Last 24 hours) at 03/03/16 0807 Last data filed at 03/03/16 0700  Gross per 24 hour  Intake 1835.84 ml  Output   2900 ml  Net -1064.16 ml    LABS: Basic Metabolic Panel:  Recent Labs  83/81/84 0503 03/03/16 0322  NA 134* 136  K 5.1 5.4*  CL 102 101  CO2 23 24  GLUCOSE 140* 128*  BUN 23* 24*  CREATININE 1.52* 1.40*  CALCIUM 8.9 9.5   Liver Function Tests: No results for input(s): AST, ALT, ALKPHOS, BILITOT, PROT,  ALBUMIN in the last 72 hours. No results for input(s): LIPASE, AMYLASE in the last 72 hours. CBC:  Recent Labs  03/02/16 0246 03/03/16 0322  WBC 11.8* 12.0*  HGB 12.5* 13.1  HCT 38.8* 41.1  MCV 99.7 99.5  PLT 188 204   Cardiac Enzymes: No results for input(s): CKTOTAL, CKMB, CKMBINDEX, TROPONINI in the last 72 hours. BNP: Invalid input(s): POCBNP D-Dimer: No results for input(s): DDIMER in the last 72 hours. Hemoglobin A1C: No results for input(s): HGBA1C in the last 72 hours. Fasting Lipid Panel: No results for input(s): CHOL, HDL, LDLCALC, TRIG, CHOLHDL, LDLDIRECT in the last 72 hours. Thyroid Function Tests: No results for input(s): TSH, T4TOTAL, T3FREE, THYROIDAB in the last 72 hours.  Invalid input(s): FREET3 Anemia Panel: No results for input(s): VITAMINB12, FOLATE, FERRITIN, TIBC, IRON, RETICCTPCT in the last 72 hours.  RADIOLOGY: Dg Chest 2 View  02/27/2016  CLINICAL DATA:  Left chest pain for 2 months EXAM: CHEST  2 VIEW COMPARISON:  May 16, 2015 FINDINGS: The heart size and mediastinal contours are stable. The heart size is enlarged. Patient is status post prior CABG and median sternotomy. Cardiac pacemaker is unchanged. Both lungs  are clear. The visualized skeletal structures are unremarkable. IMPRESSION: No active cardiopulmonary disease. Electronically Signed   By: Sherian Rein M.D.   On: 02/27/2016 14:06    PHYSICAL EXAM General: NAD Neck: JVP 9-10 cm, no thyromegaly or thyroid nodule.  Lungs: Decreased breath sounds left base, occasional rhonchi. CV: Nondisplaced PMI.  Heart regular S1/S2, no S3/S4, no murmur.  Trace ankle edema.  No carotid bruit.  Normal pedal pulses.  Abdomen: Soft, nontender, no hepatosplenomegaly, no distention.  Neurologic: Alert and oriented x 3.  Psych: Normal affect. Extremities: No clubbing or cyanosis.   TELEMETRY: Reviewed telemetry pt in atrial fibrillation with controlled rate.  ASSESSMENT AND PLAN: 75 yo with severe CAD  s/p CABG, ischemic cardiomyopathy/chronic systolic CHF, St Jude CRT-D, chronic atrial fibrillation, rheumatoid arthritis on chronic steroids, and severe spinal stenosis. He has a chronically elevated left hemidiaphragm.  He was admitted with multiple CP episodes over 48 hours and use of 20 NTG over that period.  Mild TnI elevation concerning for NSTEMI.  1. CAD: Known severe CAD.  Last cath in 2015 showed occlusion of SVG-PDA and PDA itself but patent SVG-OM and LIMA-LAD/D1.  He has had a pattern of chronic stable angina long-term.  Cardiolite in 12/16 showed infarction without ischemia.  He had worsening chest pain episodes over about 2 wks, culminating with 2 days of chest pain on and off continuously. He took up to 20 NTG.  Came to ER => very mild troponin elevation to 0.05.  Repeat cath this admission showed no change in anatomy, no explanation for worsened pain.  He has actually had no pain here.  ?Referred pain from his back.    - Continue ASA 81, statin.  - He is on maximal medical management for angina, continue Toprol XL, ranolazine, and Imdur.  Hopefully pain pattern will return to baseline given unchanged cath, I think the problem may be musculoskeletal (Severe back arthritis).  2. Acute on chronic systolic CHF: EF 16-60% with moderate MR by echo in this admission.  Ischemic cardiomyopathy.  St Jude CRT-D.  Weight is up with hydration with cath.  He more short of breath and has some volume overload on exam.  - Lasix 60 mg IV bid today, then back to home torsemide likely tomorrow.   - K high today off torsemide.  Will continue to hold spironolactone, likely restart tomorrow now that he will be back on diuretics.   - Continue Toprol XL and hydralazine/imdur.  - No ACEI with CKD.  3. CKD: Creatinine down to 1.4 with hydration.  Follow closely after diuresis today.   4. Chronic atrial fibrillation: heparin gtt ongoing, restarted coumadin.  5. RA: chronic prednisone.  6. Disposition: Diurese with  IV Lasix today, likely home tomorrow.   Marca Ancona 03/03/2016 8:07 AM

## 2016-03-03 NOTE — Progress Notes (Signed)
ANTICOAGULATION CONSULT NOTE - Follow Up Consult  Pharmacy Consult for Heparin and warfarin Indication: Afib  Allergies  Allergen Reactions  . Butrans [Buprenorphine] Shortness Of Breath  . Influenza Vaccines Other (See Comments)    Severe coughing for weeks after vaccine; also had flu like symptoms for about 7 to 8 weeks.   . Lanoxin [Digoxin] Other (See Comments)    Nausea, anorexia, caused tremendous weight loss  . Lisinopril Other (See Comments)    cough  . Clarithromycin Other (See Comments)    Unknown, thinks "blisters in mouth"  . Statins Other (See Comments)    unknown    Patient Measurements: Height: 6' (182.9 cm) Weight: 206 lb (93.441 kg) IBW/kg (Calculated) : 77.6  Vital Signs: Temp: 98.1 F (36.7 C) (03/21 0540) Temp Source: Oral (03/21 0540) BP: 110/75 mmHg (03/21 1029) Pulse Rate: 75 (03/21 1029)  Labs:  Recent Labs  03/01/16 0254 03/02/16 0246 03/02/16 0503 03/02/16 2211 03/03/16 0322  HGB 12.7* 12.5*  --   --  13.1  HCT 38.8* 38.8*  --   --  41.1  PLT 180 188  --   --  204  LABPROT 16.5* 16.6*  --   --  15.3*  INR 1.31 1.33  --   --  1.19  HEPARINUNFRC 0.43 0.35  --  0.22* 0.46  CREATININE 1.80*  --  1.52*  --  1.40*    Estimated Creatinine Clearance: 53.3 mL/min (by C-G formula based on Cr of 1.4).  Assessment: 75 yo male here with CP and with history of CAD w/ CABG. He was on warfarin PTA for afib and switched to heparin when INR < 2. He is s/p cath with unchanged anatomy since last cath - plan is for medical management. He continues on heparin to bridge warfarin post-cath.  HL therapeutic at 0.46 this morning, Hgb stable, Plts stable, No s/sx bleeding noted.   PTA warfarin dose is 5 mg daily except 7.5 mg every TTSu, last taken 3/16 PTA.   Goal of Therapy:  INR 2-3 Heparin level 0.3-0.7 units/ml Monitor platelets by anticoagulation protocol: Yes   Plan:  -Heparin drip at 1350 units/hr -Warfarin 10 mg PO tonight -Daily HL, INR and  CBC -Monitor for s/sx bleeding -Consider Lovenox bridge upon discharge if INR subtherapeutic  [facility], [place].D., BCPS Clinical Pharmacist Pager: [phone] 03/03/2016 12:18 PM

## 2016-03-04 LAB — BASIC METABOLIC PANEL
ANION GAP: 6 (ref 5–15)
BUN: 25 mg/dL — AB (ref 6–20)
CALCIUM: 9.1 mg/dL (ref 8.9–10.3)
CO2: 26 mmol/L (ref 22–32)
Chloride: 99 mmol/L — ABNORMAL LOW (ref 101–111)
Creatinine, Ser: 1.57 mg/dL — ABNORMAL HIGH (ref 0.61–1.24)
GFR calc Af Amer: 48 mL/min — ABNORMAL LOW (ref >60)
GFR, EST NON AFRICAN AMERICAN: 41 mL/min — AB (ref >60)
GLUCOSE: 171 mg/dL — AB (ref 65–99)
Potassium: 5 mmol/L (ref 3.5–5.1)
SODIUM: 131 mmol/L — AB (ref 135–145)

## 2016-03-04 LAB — CBC
HCT: 40.5 % (ref 39.0–52.0)
HEMOGLOBIN: 13.1 g/dL (ref 13.0–17.0)
MCH: 32 pg (ref 26.0–34.0)
MCHC: 32.3 g/dL (ref 30.0–36.0)
MCV: 98.8 fL (ref 78.0–100.0)
Platelets: 192 10*3/uL (ref 150–400)
RBC: 4.1 MIL/uL — ABNORMAL LOW (ref 4.22–5.81)
RDW: 15.2 % (ref 11.5–15.5)
WBC: 11.6 10*3/uL — AB (ref 4.0–10.5)

## 2016-03-04 LAB — GLUCOSE, CAPILLARY: Glucose-Capillary: 145 mg/dL — ABNORMAL HIGH (ref 65–99)

## 2016-03-04 LAB — PROTIME-INR
INR: 1.2 (ref 0.00–1.49)
PROTHROMBIN TIME: 15.4 s — AB (ref 11.6–15.2)

## 2016-03-04 LAB — HEPARIN LEVEL (UNFRACTIONATED): HEPARIN UNFRACTIONATED: 0.39 [IU]/mL (ref 0.30–0.70)

## 2016-03-04 MED ORDER — TORSEMIDE 20 MG PO TABS: 40.0000 mg | ORAL_TABLET | Freq: Every day | ORAL | Status: DC

## 2016-03-04 MED ORDER — WARFARIN SODIUM 5 MG PO TABS: ORAL_TABLET | ORAL | Status: AC

## 2016-03-04 MED ORDER — TORSEMIDE 20 MG PO TABS: ORAL_TABLET | ORAL | Status: DC

## 2016-03-04 MED ORDER — ASPIRIN 81 MG PO CHEW: 81.0000 mg | CHEWABLE_TABLET | Freq: Every day | ORAL | Status: DC

## 2016-03-04 NOTE — Progress Notes (Signed)
Patient ID: [name], male   DOB: 1941/07/03, 75 y.o.   MRN: [id number]   SUBJECTIVE: No chest pain.  He diuresed well yesterday, weight down 5 lbs.    LHC (3/20) with patent LIMA-LAD/D1 and patent SVG-OM1, occluded SVG-PDA.  No change in anatomy from prior.    Scheduled Meds: . aspirin  81 mg Oral Daily  . atorvastatin  40 mg Oral Daily  . fluticasone  2 spray Each Nare Daily  . hydrALAZINE  37.5 mg Oral TID  . insulin aspart  10 Units Subcutaneous BID AC  . insulin glargine  32 Units Subcutaneous q morning - 10a  . isosorbide mononitrate  120 mg Oral Daily  . levothyroxine  175 mcg Oral QAC breakfast  . metoprolol succinate  150 mg Oral BID  . pantoprazole  40 mg Oral Daily  . predniSONE  5 mg Oral BID  . ranolazine  1,000 mg Oral BID  . sodium chloride flush  3 mL Intravenous Q12H  . torsemide  40 mg Oral Daily  . Warfarin - Pharmacist Dosing Inpatient   Does not apply q1800   Continuous Infusions: . heparin 1,350 Units/hr (03/03/16 1723)   PRN Meds:.sodium chloride, acetaminophen, nitroGLYCERIN, ondansetron (ZOFRAN) IV, oxyCODONE-acetaminophen, polyethylene glycol, sodium chloride flush    Filed Vitals:   03/03/16 2019 03/03/16 2106 03/04/16 0102 03/04/16 0526  BP: 109/70 109/65 110/67 118/73  Pulse: 74  75 74  Temp: 98.2 F (36.8 C)  98.5 F (36.9 C) 97.6 F (36.4 C)  TempSrc: Oral  Oral Axillary  Resp:      Height:      Weight:    201 lb 14.4 oz (91.581 kg)  SpO2: 97%  94% 98%    Intake/Output Summary (Last 24 hours) at 03/04/16 0838 Last data filed at 03/04/16 0826  Gross per 24 hour  Intake 1809.68 ml  Output   4745 ml  Net -2935.32 ml    LABS: Basic Metabolic Panel:  Recent Labs  32/99/24 0322 03/04/16 0430  NA 136 131*  K 5.4* 5.0  CL 101 99*  CO2 24 26  GLUCOSE 128* 171*  BUN 24* 25*  CREATININE 1.40* 1.57*  CALCIUM 9.5 9.1   Liver Function Tests: No results for input(s): AST, ALT, ALKPHOS, BILITOT, PROT, ALBUMIN in the last 72  hours. No results for input(s): LIPASE, AMYLASE in the last 72 hours. CBC:  Recent Labs  03/03/16 0322 03/04/16 0430  WBC 12.0* 11.6*  HGB 13.1 13.1  HCT 41.1 40.5  MCV 99.5 98.8  PLT 204 192   Cardiac Enzymes: No results for input(s): CKTOTAL, CKMB, CKMBINDEX, TROPONINI in the last 72 hours. BNP: Invalid input(s): POCBNP D-Dimer: No results for input(s): DDIMER in the last 72 hours. Hemoglobin A1C: No results for input(s): HGBA1C in the last 72 hours. Fasting Lipid Panel: No results for input(s): CHOL, HDL, LDLCALC, TRIG, CHOLHDL, LDLDIRECT in the last 72 hours. Thyroid Function Tests: No results for input(s): TSH, T4TOTAL, T3FREE, THYROIDAB in the last 72 hours.  Invalid input(s): FREET3 Anemia Panel: No results for input(s): VITAMINB12, FOLATE, FERRITIN, TIBC, IRON, RETICCTPCT in the last 72 hours.  RADIOLOGY: Dg Chest 2 View  02/27/2016  CLINICAL DATA:  Left chest pain for 2 months EXAM: CHEST  2 VIEW COMPARISON:  May 16, 2015 FINDINGS: The heart size and mediastinal contours are stable. The heart size is enlarged. Patient is status post prior CABG and median sternotomy. Cardiac pacemaker is unchanged. Both lungs are clear. The visualized skeletal  structures are unremarkable. IMPRESSION: No active cardiopulmonary disease. Electronically Signed   By: Sherian Rein M.D.   On: 02/27/2016 14:06    PHYSICAL EXAM General: NAD Neck: JVP 8-9 cm, no thyromegaly or thyroid nodule.  Lungs: Decreased breath sounds left base, occasional rhonchi. CV: Nondisplaced PMI.  Heart regular S1/S2, no S3/S4, no murmur.  Trace ankle edema.  No carotid bruit.  Normal pedal pulses.  Abdomen: Soft, nontender, no hepatosplenomegaly, no distention.  Neurologic: Alert and oriented x 3.  Psych: Normal affect. Extremities: No clubbing or cyanosis.   TELEMETRY: Reviewed telemetry pt in atrial fibrillation with controlled rate.  ASSESSMENT AND PLAN: 75 yo with severe CAD s/p CABG, ischemic  cardiomyopathy/chronic systolic CHF, St Jude CRT-D, chronic atrial fibrillation, rheumatoid arthritis on chronic steroids, and severe spinal stenosis. He has a chronically elevated left hemidiaphragm.  He was admitted with multiple CP episodes over 48 hours and use of 20 NTG over that period.  Mild TnI elevation concerning for NSTEMI.  1. CAD: Known severe CAD.  Last cath in 2015 showed occlusion of SVG-PDA and PDA itself but patent SVG-OM and LIMA-LAD/D1.  He has had a pattern of chronic stable angina long-term.  Cardiolite in 12/16 showed infarction without ischemia.  He had worsening chest pain episodes over about 2 wks, culminating with 2 days of chest pain on and off continuously. He took up to 20 NTG.  Came to ER => very mild troponin elevation to 0.05.  Repeat cath this admission showed no change in anatomy, no explanation for worsened pain.  He has actually had no pain here.  ?Referred pain from his back.    - Continue ASA 81, statin.  - He is on maximal medical management for angina, continue Toprol XL, ranolazine, and Imdur.  Hopefully pain pattern will return to baseline given unchanged cath, I think the problem may be musculoskeletal (Severe back arthritis).  2. Acute on chronic systolic CHF: EF 16-10% with moderate MR by echo in this admission.  Ischemic cardiomyopathy.  St Jude CRT-D.  He was short of breath and volume overloaded yesterday.  Did well with IV diuresis, weight down.  - Can start back on home torsemide tomorrow, got IV Lasix this morning.      - Continue Toprol XL and hydralazine/imdur. Restart spironolactone 12.5 daily at home.  - No ACEI with CKD.  3. CKD: Creatinine 1.5, stable.  4. Chronic atrial fibrillation: heparin gtt ongoing, restarted coumadin.  5. RA: chronic prednisone.  6. Disposition: Home today.  Followup with me in 2-3 weeks.  Needs followup in coumadin clinic later this week.  He will continue his same meds as he was taking prior to admission.  I do not think  that he needs Lovenox bridging to therapeutic INR, will just allow INR to drift up now that he is back on warfarin.    Marca Ancona 03/04/2016 8:38 AM

## 2016-03-04 NOTE — Discharge Summary (Signed)
Advanced Heart Failure Team  Discharge Summary   Patient ID: [name] MRN: [id number], DOB/AGE: May 13, 1941 75 y.o. Admit date: 02/27/2016 D/C date:     03/04/2016   Primary Discharge Diagnoses:  1. CAD: Known severe CAD. 2. A/C Systolic Heart Failure  3. CKD  Stage III 4. Chronic A fib- on coumadin 5. RA: on chronic prednisone 6. Hyperkalemia    Hospital Course:  [name] is a 75 yo with severe CAD s/p CABG, ischemic cardiomyopathy/chronic systolic CHF, St Jude CRT-D, chronic atrial fibrillation, rheumatoid arthritis on chronic steroids, and severe spinal stenosis. He has a chronically elevated left hemidiaphragm. He was admitted with multiple CP episodes over 48 hours and use of 20 NTG over that period. Mild TnI elevation concerning for NSTEMI.   1. CAD: Known severe CAD. Last cath in 2015 showed occlusion of SVG-PDA and PDA itself but patent SVG-OM and LIMA-LAD/D1. He has had a pattern of chronic stable angina long-term. Cardiolite in 12/16 showed infarction without ischemia. He had worsening chest pain episodes over about 2 wks, culminating with 2 days of chest pain on and off continuously. He took up to 20 NTG. Came to ER => very mild troponin elevation to 0.05. Repeat cath this admission showed no change in anatomy, no explanation for worsened pain. He has actually had no pain here. ?Referred pain from his back.  - Continue ASA 81, statin.  - He is on maximal medical management for angina, continue Toprol XL, ranolazine, and Imdur. Hopefully pain pattern will return to baseline given unchanged cath, I think the problem may be musculoskeletal (Severe back arthritis).  2. Acute on chronic systolic CHF: EF 75-64% with moderate Daniel by echo in this admission. Ischemic cardiomyopathy. St Jude CRT-D. Volume overloaded. Diuresed with IV lasix and he will start torsemide tomorrow at home. Overall diuresed 13 pounds.   - Continue Toprol XL and hydralazine/imdur and  spironolactone 12.5 daily at home.  - No ACEI with CKD.  3. CKD: Creatinine 1.5, stable. Creatinine peaked at 1.8 but was back down to 1.57 on the day of discharge.  4. Chronic atrial fibrillation: Placed on heparin bridge with coumadin. INR on d/c 1.22 He will continue coumadin and follow up in the coumadin clinic next week.   5. RA: chronic prednisone. 6. Hyperkalemia - Potassium peaked 5.4 but was back down to 5.0 on discharge.   He will continue to be followed closely in the HF clinic. He has follow up with Dr [name] in the   Procedures [facility] 03/02/2016   Prox LAD to Mid LAD lesion, 100% stenosed.  Mid Cx to Dist Cx lesion, 75% stenosed.  1st Mrg lesion, 75% stenosed.  Ost RPDA lesion, 100% stenosed.  LIMA .  SVG .  SVG .  Origin to Prox Graft lesion, 100% stenosed.  Prox RCA lesion, 40% stenosed. Mid RCA to Dist RCA lesion, 40% stenosed.  Discharge Weight: 201 pounds  Discharge Vitals: Blood pressure 115/74, pulse 69, temperature 97.6 F (36.4 C), temperature source Axillary, resp. rate 18, height 6' (1.829 m), weight 201 lb 14.4 oz (91.581 kg), SpO2 98 %.  Labs: Lab Results  Component Value Date   WBC 11.6* 03/04/2016   HGB 13.1 03/04/2016   HCT 40.5 03/04/2016   MCV 98.8 03/04/2016   PLT 192 03/04/2016     Recent Labs Lab 03/04/16 0430  NA 131*  K 5.0  CL 99*  CO2 26  BUN 25*  CREATININE 1.57*  CALCIUM 9.1  GLUCOSE 171*  Lab Results  Component Value Date   CHOL 104 02/28/2016   HDL 31* 02/28/2016   LDLCALC 54 02/28/2016   TRIG 94 02/28/2016   BNP (last 3 results)  Recent Labs  12/03/15 1415 01/23/16 1600 02/27/16 1520  BNP 558.7* 759.4* 1124.9*    ProBNP (last 3 results) No results for input(s): PROBNP in the last 8760 hours.   Diagnostic Studies/Procedures   No results found.  Discharge Medications     Medication List    TAKE these medications        aspirin 81 MG chewable tablet  Chew 1 tablet (81 mg total) by mouth  daily.     atorvastatin 40 MG tablet  Commonly known as:  LIPITOR  Take 40 mg by mouth daily.     Fish Oil 1000 MG Caps  Take 3,000 mg by mouth every morning.     fluticasone 50 MCG/ACT nasal spray  Commonly known as:  FLONASE  Place 2 sprays into both nostrils daily.     hydrALAZINE 25 MG tablet  Commonly known as:  APRESOLINE  Take 1.5 tablets (37.5 mg total) by mouth 3 (three) times daily.     insulin lispro 100 UNIT/ML injection  Commonly known as:  HUMALOG  Inject 10 Units into the skin 2 (two) times daily before a meal.     isosorbide mononitrate 60 MG 24 hr tablet  Commonly known as:  IMDUR  Take 2 tablets (120 mg total) by mouth daily.     levothyroxine 175 MCG tablet  Commonly known as:  SYNTHROID, LEVOTHROID  Take 175 mcg by mouth daily before breakfast.     metoprolol succinate 100 MG 24 hr tablet  Commonly known as:  TOPROL-XL  TAKE 1 & 1/2 TABLETS BY MOUTH TWICE A DAY     nitroGLYCERIN 0.4 MG SL tablet  Commonly known as:  NITROSTAT  Place 1 tablet (0.4 mg total) under the tongue every 5 (five) minutes as needed for chest pain. KEEP OV.     nystatin cream  Commonly known as:  MYCOSTATIN  Apply 1 application topically daily as needed for dry skin.     omeprazole 20 MG capsule  Commonly known as:  PRILOSEC  Take 20 mg by mouth daily.     oxyCODONE-acetaminophen 5-325 MG tablet  Commonly known as:  PERCOCET/ROXICET  Take 1-2 tablets by mouth every 4 (four) hours as needed for severe pain. Reported on 12/03/2015     polyethylene glycol packet  Commonly known as:  MIRALAX / GLYCOLAX  Take 17 g by mouth daily as needed.     predniSONE 5 MG tablet  Commonly known as:  DELTASONE  Take 1 tablet by mouth 2 (two) times daily.     ranolazine 1000 MG SR tablet  Commonly known as:  RANEXA  Take 1 tablet (1,000 mg total) by mouth 2 (two) times daily.     spironolactone 25 MG tablet  Commonly known as:  ALDACTONE  TAKE 1/2 TABLET DAILY     torsemide 20 MG  tablet  Commonly known as:  DEMADEX  Take 2 tablets daily and take 1 extra tablet every other day.  Start taking on:  03/05/2016     TOUJEO SOLOSTAR 300 UNIT/ML Sopn  Generic drug:  Insulin Glargine  Inject 32 Units into the skin every morning.     triamcinolone cream 0.1 %  Commonly known as:  KENALOG  Apply 1 application topically daily as needed (for rash).  warfarin 5 MG tablet  Commonly known as:  COUMADIN  ON 3/22 take 10 mg then resume home regimen 3/23.        Disposition   The patient will be discharged in stable condition to home. Discharge Instructions    Diet - low sodium heart healthy    Complete by:  As directed      Heart Failure patients record your daily weight using the same scale at the same time of day    Complete by:  As directed      Increase activity slowly    Complete by:  As directed           Follow-up Information    Follow up with Marca Ancona, MD On 03/19/2016.   Specialty:  Cardiology   Why:  at 10:30 Garage Code 2000   Contact information:   [address] [place] [place] [address] [phone]       Follow up with [facility] Office On 03/10/2016.   Specialty:  Cardiology   Why:  Coumadin Clinic at 10:20    Contact information:   [address], [address] [place] [address] [phone]        Duration of Discharge Encounter: Greater than 35 minutes   Signed, Tonye Becket NP-C  03/04/2016, 2:29 PM

## 2016-03-04 NOTE — Progress Notes (Signed)
Patient and wife given discharge instructions and all questions answered.  Pt. Discharged via wheelchair with all belongings.  

## 2016-03-05 ENCOUNTER — Other Ambulatory Visit (HOSPITAL_COMMUNITY): Payer: Self-pay | Admitting: Cardiology

## 2016-03-10 ENCOUNTER — Ambulatory Visit (INDEPENDENT_AMBULATORY_CARE_PROVIDER_SITE_OTHER): Payer: PPO | Admitting: Pharmacist Clinician (PhC)/ Clinical Pharmacy Specialist

## 2016-03-10 DIAGNOSIS — Z7901 Long term (current) use of anticoagulants: Secondary | ICD-10-CM

## 2016-03-10 DIAGNOSIS — I4821 Permanent atrial fibrillation: Secondary | ICD-10-CM

## 2016-03-10 DIAGNOSIS — I482 Chronic atrial fibrillation: Secondary | ICD-10-CM

## 2016-03-10 LAB — POCT INR: INR: 1.2

## 2016-03-19 ENCOUNTER — Encounter (HOSPITAL_COMMUNITY): Payer: Self-pay

## 2016-03-19 ENCOUNTER — Ambulatory Visit (HOSPITAL_COMMUNITY)
Admit: 2016-03-19 | Discharge: 2016-03-19 | Disposition: A | Discharge status: Home / Self Care | Payer: PPO | Source: Ambulatory Visit | Attending: Cardiology | Admitting: Cardiology

## 2016-03-19 ENCOUNTER — Ambulatory Visit (INDEPENDENT_AMBULATORY_CARE_PROVIDER_SITE_OTHER): Payer: PPO | Admitting: Pharmacist Clinician (PhC)/ Clinical Pharmacy Specialist

## 2016-03-19 VITALS — BP 100/60 | HR 67 | Resp 18 | Wt 202.5 lb

## 2016-03-19 DIAGNOSIS — Z7952 Long term (current) use of systemic steroids: Secondary | ICD-10-CM | POA: Insufficient documentation

## 2016-03-19 DIAGNOSIS — Z8249 Family history of ischemic heart disease and other diseases of the circulatory system: Secondary | ICD-10-CM | POA: Diagnosis not present

## 2016-03-19 DIAGNOSIS — K219 Gastro-esophageal reflux disease without esophagitis: Secondary | ICD-10-CM | POA: Insufficient documentation

## 2016-03-19 DIAGNOSIS — I482 Chronic atrial fibrillation: Secondary | ICD-10-CM | POA: Diagnosis not present

## 2016-03-19 DIAGNOSIS — I5042 Chronic combined systolic (congestive) and diastolic (congestive) heart failure: Secondary | ICD-10-CM

## 2016-03-19 DIAGNOSIS — E785 Hyperlipidemia, unspecified: Secondary | ICD-10-CM | POA: Insufficient documentation

## 2016-03-19 DIAGNOSIS — I255 Ischemic cardiomyopathy: Secondary | ICD-10-CM | POA: Insufficient documentation

## 2016-03-19 DIAGNOSIS — I252 Old myocardial infarction: Secondary | ICD-10-CM | POA: Insufficient documentation

## 2016-03-19 DIAGNOSIS — N183 Chronic kidney disease, stage 3 (moderate): Secondary | ICD-10-CM | POA: Insufficient documentation

## 2016-03-19 DIAGNOSIS — Z7901 Long term (current) use of anticoagulants: Secondary | ICD-10-CM | POA: Insufficient documentation

## 2016-03-19 DIAGNOSIS — E039 Hypothyroidism, unspecified: Secondary | ICD-10-CM | POA: Diagnosis not present

## 2016-03-19 DIAGNOSIS — I4891 Unspecified atrial fibrillation: Secondary | ICD-10-CM

## 2016-03-19 DIAGNOSIS — I5022 Chronic systolic (congestive) heart failure: Secondary | ICD-10-CM | POA: Diagnosis not present

## 2016-03-19 DIAGNOSIS — Z951 Presence of aortocoronary bypass graft: Secondary | ICD-10-CM | POA: Diagnosis not present

## 2016-03-19 DIAGNOSIS — M069 Rheumatoid arthritis, unspecified: Secondary | ICD-10-CM | POA: Diagnosis not present

## 2016-03-19 DIAGNOSIS — Z95810 Presence of automatic (implantable) cardiac defibrillator: Secondary | ICD-10-CM | POA: Diagnosis not present

## 2016-03-19 DIAGNOSIS — Z794 Long term (current) use of insulin: Secondary | ICD-10-CM | POA: Diagnosis not present

## 2016-03-19 DIAGNOSIS — R0600 Dyspnea, unspecified: Secondary | ICD-10-CM | POA: Diagnosis not present

## 2016-03-19 DIAGNOSIS — E1122 Type 2 diabetes mellitus with diabetic chronic kidney disease: Secondary | ICD-10-CM | POA: Insufficient documentation

## 2016-03-19 DIAGNOSIS — I25719 Atherosclerosis of autologous vein coronary artery bypass graft(s) with unspecified angina pectoris: Secondary | ICD-10-CM | POA: Diagnosis not present

## 2016-03-19 DIAGNOSIS — Z7982 Long term (current) use of aspirin: Secondary | ICD-10-CM | POA: Diagnosis not present

## 2016-03-19 DIAGNOSIS — I4821 Permanent atrial fibrillation: Secondary | ICD-10-CM

## 2016-03-19 DIAGNOSIS — I130 Hypertensive heart and chronic kidney disease with heart failure and stage 1 through stage 4 chronic kidney disease, or unspecified chronic kidney disease: Secondary | ICD-10-CM | POA: Diagnosis not present

## 2016-03-19 LAB — POCT INR: INR: 1.6

## 2016-03-19 LAB — BASIC METABOLIC PANEL
ANION GAP: 11 (ref 5–15)
BUN: 36 mg/dL — ABNORMAL HIGH (ref 6–20)
CALCIUM: 9.4 mg/dL (ref 8.9–10.3)
CO2: 22 mmol/L (ref 22–32)
CREATININE: 2.11 mg/dL — AB (ref 0.61–1.24)
Chloride: 100 mmol/L — ABNORMAL LOW (ref 101–111)
GFR, EST AFRICAN AMERICAN: 33 mL/min — AB (ref >60)
GFR, EST NON AFRICAN AMERICAN: 29 mL/min — AB (ref >60)
Glucose, Bld: 147 mg/dL — ABNORMAL HIGH (ref 65–99)
Potassium: 5.4 mmol/L — ABNORMAL HIGH (ref 3.5–5.1)
SODIUM: 133 mmol/L — AB (ref 135–145)

## 2016-03-19 MED ORDER — TORSEMIDE 20 MG PO TABS: ORAL_TABLET | ORAL | Status: DC

## 2016-03-19 NOTE — Progress Notes (Signed)
Patient ID: [name], male   DOB: [birth year], 75 y.o.   MRN: [id number] PCP: Dr. Thayer Headings HF Cardiologist: Dr. [name]  HPI: Mr. Arntz is a 75 yo male with a history of HTN, DM2, HLD, CAD s/p CABG 1998, ischemic cardiomyopathy, chronic angina, chronic atrial fibrillation, LBBB, rheumatoid arthritis on chronic steroids, and severe spinal stenosis.  He has a chronically elevated left hemidiaphragm.  He has left hip pain with adductor muscle tears on prior MRI.   In 11/15 had prolonged hospitalization with cardiogenic shock requiring IABP and milrinone. Suspect occlusion of SVG-PDA prior to this hospitalization.Last admitted to [facility] in 12/15 with chest pain. Cardiac enzymes negative. Chest discomfort thought to be from upper respiratory infection and not heart failure. Discharge weight 199 pounds.    He had placement of St Jude CRT-D system in 5/16.    Today he returns for post hospital follow up. Admitted in 3/17 with CP. Had LHC with similar to anatomy to prior cath, medically managed. Overall feeling ok. Says he has ongoing back pain. He is not having any chest pain, now just neck pain.  Ongoing dyspnea with exertion, no change.  He is short of breath walking around the house.  Has difficulty standing because of back problems. Weight is down 12 lbs compared to prior appt. Taking all medications.   Labs (11/15): K 5.3, creatinine 1.98, BNP 15184 => 5069, hemoglobin 13.6  Labs (1/16): BNP 4338 Labs (01/16/2015): K 4.0 Creatinine 1.36   Labs (5/16): K 5 => 4, creatinine 1.59 => 1.58, BNP 574 Labs (6/16): K 4.5, creatinine 1.6 Labs (8/16): BNP 356, K 4.3, creatinine 1.5 Labs (10/16): K 4.2, creatinine 1.69 => 1.88 Labs (12/16): K 4.5, creatinine 1.78, TGs 331, LDL 22, BNP 556 Labs (03/04/2016) : K 5.0, Creatinine 1.57, HCT 40.5  Studies: Echo (10/15/14): EF 25%, mod calcified annulus mitral valve, mod MR, RV sys fx mildly reduced LHC (10/16/14): patent LIMA-LAD and SVG to OM, TO SVG-PDA  with TO PDA and 60% proximal RCA, some left =>PDA collaterals;  RHC (10/16/14): RA 20, RV 69/11, PA 69/36 (35), PCWP 35, PA sat: 46% on 100% NRB, Fick CO/CI: 3.15/1.43, Thermo CO/CI: 3.61/1.63 ECG (11/15): atrial fibrillation with LBBB, QRS 132 msec.  ECHO (01/2015): EF 25-30%, mild MR.  Lexiscan Cardiolite (12/16): Large inferolateral scar, no ischemia.  LHC 03/02/2016   Prox LAD to Mid LAD lesion, 100% stenosed.  Mid Cx to Dist Cx lesion, 75% stenosed.  1st Mrg lesion, 75% stenosed.  Ost RPDA lesion, 100% stenosed.  LIMA - LAD patent.  SVG - OM1 patent.  SVG - RCA totally occluded.  Prox RCA lesion, 40% stenosed. Mid RCA to Dist RCA lesion, 40% stenosed. Echo (3/17): EF 20-25%, inferolateral akinesis, mild AI, moderate MR, mildly decreased RV systolic function.   ROS: All systems negative except as listed in HPI, PMH and Problem List.  SH:  Social History   Social History  . Marital Status: Married    Spouse Name: N/A  . Number of Children: N/A  . Years of Education: N/A   Occupational History  . Not on file.   Social History Main Topics  . Smoking status: Never Smoker   . Smokeless tobacco: Never Used  . Alcohol Use: No  . Drug Use: No  . Sexual Activity: Not Currently   Other Topics Concern  . Not on file   Social History Narrative    FH:  Family History  Problem Relation Age of Onset  .  Heart disease Mother   . Heart disease Father   . Heart disease Brother   . Heart disease Maternal Aunt   . Cancer      Past Medical History  Diagnosis Date  . MI (myocardial infarction) (HCC)     "10/14/2014 I was told I'd had 2 MI's & didn't even know it" (11/27/2014)  . Coronary artery disease involving native coronary artery     a. CABG 1998. b. MYOVIEW 11/2015: Large, Severe FIXED Defect in Cx distribution +/- dLAD.  SCAR w/o Ishcemia.   . 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 NSTEMI 02/2013: Progression of dz in SVG-RPDA  followed by severe stenosis in grafted vessel, not likely safe for PCI - med rx. b. NSTEMI 10/2014: patent LIMA-LAD and SVG to OM, TO SVG-PDA with TO PDA and 60% proximal RCA, some left =>PDA collaterals; culprit was likely occlusion of SVG-PDA though this may have been subacute given some collaterals.  . Chronic chest pain   . Cardiomyopathy, ischemic 1998    s/p ICD; Intolerant of Ranexa.  . Chronic combined systolic and diastolic CHF, NYHA class 3 (HCC)     a. EF prev 25%, improved to 45-50% in 2014. b. 10/2014: EF back downt o 25%.  . Chronic atrial fibrillation (HCC)     Rate controlled - Now with ICD/PPM; On Coumadin  . Digitalis toxicity   . Hyperkalemia     a. Spironolactone d/c'd due to this.  [name] LBBB (left bundle branch block)   . Moderate mitral regurgitation by prior echocardiogram 10/2014 echo  . CKD (chronic kidney disease), stage III   . Hyperlipidemia LDL goal <70   . Moderate mitral regurgitation 10/2014 echo  . Type II diabetes mellitus (HCC)   . Rheumatoid arthritis (HCC)   . Long term current use of systemic steroids     for RA  . Hypothyroidism   . 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 Pleuritis, purulent (HCC)     h/o------ with negative vats biopsy; 1994  . 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 Spinal stenosis     Very limited mobility - almost wheelchair bound  . Hip pain     a. adductor muscle tears on prior MRI.  [name] GERD (gastroesophageal reflux disease)     Current Outpatient Prescriptions  Medication Sig Dispense Refill  . aspirin 81 MG chewable tablet Chew 1 tablet (81 mg total) by mouth daily. 30 tablet 6  . atorvastatin (LIPITOR) 40 MG tablet Take 40 mg by mouth daily.  0  . fluticasone (FLONASE) 50 MCG/ACT nasal spray Place 2 sprays into both nostrils daily.     . hydrALAZINE (APRESOLINE) 25 MG tablet Take 1.5 tablets (37.5 mg  total) by mouth 3 (three) times daily. 135 tablet 3  . insulin lispro (HUMALOG) 100 UNIT/ML injection Inject 10 Units into the skin 2 (two) times daily before a meal.     . isosorbide mononitrate (IMDUR) 60 MG 24 hr tablet Take 2 tablets (120 mg total) by mouth daily. 60 tablet 11  . levothyroxine (SYNTHROID, LEVOTHROID) 175 MCG tablet Take 175 mcg by mouth daily before breakfast.    . metoprolol succinate (TOPROL-XL) 100 MG 24 hr tablet TAKE 1 & 1/2 TABLETS BY MOUTH TWICE A DAY 260 tablet 2  . nitroGLYCERIN (NITROSTAT) 0.4 MG SL tablet  Place 1 tablet (0.4 mg total) under the tongue every 5 (five) minutes as needed for chest pain. KEEP OV. 25 tablet 1  . nystatin cream (MYCOSTATIN) Apply 1 application topically daily as needed for dry skin.   4  . Omega-3 Fatty Acids (FISH OIL) 1000 MG CAPS Take 3,000 mg by mouth every morning.    [name] omeprazole (PRILOSEC) 20 MG capsule Take 20 mg by mouth daily.    [name] oxyCODONE-acetaminophen (PERCOCET/ROXICET) 5-325 MG per tablet Take 1-2 tablets by mouth every 4 (four) hours as needed for severe pain. Reported on 12/03/2015    . polyethylene glycol (MIRALAX / GLYCOLAX) packet Take 17 g by mouth daily as needed. (Patient taking differently: Take 17 g by mouth daily as needed (CONSTIPATION). ) 14 each 0  . predniSONE (DELTASONE) 5 MG tablet Take 1 tablet by mouth 2 (two) times daily.  1  . ranolazine (RANEXA) 1000 MG SR tablet Take 1 tablet (1,000 mg total) by mouth 2 (two) times daily. 60 tablet 11  . spironolactone (ALDACTONE) 25 MG tablet TAKE 1/2 TABLET DAILY 15 tablet 3  . torsemide (DEMADEX) 20 MG tablet Take 2 tablets daily and take 1 extra tablet every other day. 45 tablet 11  . TOUJEO SOLOSTAR 300 UNIT/ML SOPN Inject 32 Units into the skin every morning.   4  . triamcinolone cream (KENALOG) 0.1 % Apply 1 application topically daily as needed (for rash).     . warfarin (COUMADIN) 5 MG tablet ON 3/22 take 10 mg then resume home regimen 3/23. 135 tablet 2   No  current facility-administered medications for this encounter.    Filed Vitals:   03/19/16 1039  BP: 100/60  Pulse: 67  Resp: 18  Weight: 202 lb 8 oz (91.853 kg)  SpO2: 96%    PHYSICAL EXAM: General:No resp difficulty, in wheelchair, wife  HEENT: normal Neck: supple. JVP 5-6  Carotids 2+ bilaterally; no bruits. No lymphadenopathy or thryomegaly appreciated. Cor: PMI normal. Heart irregular S1/S2, no S3/S4, 1/6 HSM at apex.  Lungs: Decreased breath sounds left base.   Abdomen: soft, nontender, nondistended. No hepatosplenomegaly. No bruits or masses. Good bowel sounds. Extremities: no cyanosis, clubbing, rash, and no edema.   Neuro: alert & orientedx3, cranial nerves grossly intact. Moves all 4 extremities w/o difficulty. Affect pleasant.  ASSESSMENT & PLAN: 1) Chronic systolic heart failure: EF 20-25% (echo 3/17), ischemic cardiomyopathy s/p CABG.  Patient was admitted to the hospital in 11/15 for acute/chronic systolic CHF with cardiogenic shock in the setting of NSTEMI. He had IABP placed and was diuresed with IV lasix and milrinone. He was weaned off milrinone.  He is very limited by orthopedic problems and does very little walking. NYHA class III symptoms, not just due to CHF (also orthopedic-related).  St Jude CRT-D system placed in 5/16 but he is only BiV pacing 40-50% of the time despite increasing Toprol XL.  He saw Dr Ladona Ridgel, and it was decided not to do AV nodal ablation because of his poor functional status from spinal stenosis.  Recent admission for chest pain, anatomy stable on repeat angiography.  He looks euvolemic.  - Difficult for him to get to bathroom, wants to go down on torsemide.  I think this would be ok but needs to weigh daily.  I will decrease torsemide to 40 daily alternating with 20 daily.  BMET today.  - Continue current Toprol XL 150 mg bid.  - Continue hydralazine 25 mg tid and imdur 120 mg daily.    -  Not on ACEI due to CKD. - Continue 12.5 mg spironolactone  daily   - No digoxin due to history of digoxin toxicity.  2) CAD s/p CABG: Culprit for NSTEMI in 11/15 was likely occlusion of SVG-PDA.  There was no good target for intervention. He continues to have occasional exertional chest pain despite maximum medical management.  Cardiolite, however, in 12/16 showed prior infarction but no ischemia. Had [place] 3/17 with no change in anatomy compared to 11/15.  Continue medical management.  - Continue Toprol XL and statin.  - Continue Ranexa 1000 mg bid and Imdur 120 mg daily.  - No ASA. Stop today. Continue warfarin use.  3) Dyspnea:  Probably due to combination of deconditioning, systolic CHF, and elevated left hemidiaphragm.  He does not have significant COPD, and he did not have interstitial lung disease on last chest CT.     4) Atrial fibrillation: Chronic. HR controlled, will continue Toprol and warfarin.   INR 1.2.  Followed at [facility]. Has follow up today.  5) Immobility: I have contacted [facility] to see if HHPT will be beneficial. .  Follow up in 2 months  Amy Clegg  NP-C   03/19/2016   Patient seen with NP, agree with the above note.  I will let him try dropping torsemide to 40 daily alternating with 20 daily.  BMET today.  He can stop ASA given warfarin use. Chest pain improved, continue anti-anginals.  We will try to arrange for home PT.   Marca Ancona 03/19/2016

## 2016-03-19 NOTE — Patient Instructions (Signed)
CHANGE Torsemide to 20 mg (1 tab) once one day alternating with 40 mg (2 tabs) once the next day continuously.  STOP Aspirin.  Routine lab work today. Will notify you of abnormal results, otherwise no news is good news!  Follow up 6 weeks with Dr. [name].  Do the following things EVERYDAY: 1) Weigh yourself in the morning before breakfast. Write it down and keep it in a log. 2) Take your medicines as prescribed 3) Eat low salt foods-Limit salt (sodium) to 2000 mg per day.  4) Stay as active as you can everyday 5) Limit all fluids for the day to less than 2 liters

## 2016-03-20 ENCOUNTER — Telehealth (HOSPITAL_COMMUNITY): Payer: Self-pay | Admitting: Cardiology

## 2016-03-20 DIAGNOSIS — I5022 Chronic systolic (congestive) heart failure: Secondary | ICD-10-CM

## 2016-03-20 MED ORDER — TORSEMIDE 20 MG PO TABS: 20.0000 mg | ORAL_TABLET | Freq: Every day | ORAL | Status: AC

## 2016-03-20 NOTE — Telephone Encounter (Signed)
[phone] (Home) patient wife aware and voiced understanding. Order placed to have repeat labs done at coumadin Clinic 4/17

## 2016-03-20 NOTE — Telephone Encounter (Signed)
-----   Message from Laurey Morale, MD sent at 03/19/2016 10:45 PM EDT ----- Stop spironolactone.  Hold torsemide x 2 days then decrease to 20 mg daily. BMET 1 week.

## 2016-03-30 ENCOUNTER — Ambulatory Visit (INDEPENDENT_AMBULATORY_CARE_PROVIDER_SITE_OTHER): Payer: PPO | Admitting: Pharmacist Clinician (PhC)/ Clinical Pharmacy Specialist

## 2016-03-30 ENCOUNTER — Other Ambulatory Visit: Payer: Self-pay | Admitting: Cardiology

## 2016-03-30 DIAGNOSIS — Z7901 Long term (current) use of anticoagulants: Secondary | ICD-10-CM

## 2016-03-30 DIAGNOSIS — I4821 Permanent atrial fibrillation: Secondary | ICD-10-CM

## 2016-03-30 DIAGNOSIS — I482 Chronic atrial fibrillation: Secondary | ICD-10-CM

## 2016-03-30 LAB — BASIC METABOLIC PANEL
BUN: 20 mg/dL (ref 7–25)
CHLORIDE: 103 mmol/L (ref 98–110)
CO2: 18 mmol/L — AB (ref 20–31)
Calcium: 9 mg/dL (ref 8.6–10.3)
Creat: 1.39 mg/dL — ABNORMAL HIGH (ref 0.70–1.18)
Glucose, Bld: 160 mg/dL — ABNORMAL HIGH (ref 65–99)
Potassium: 4.6 mmol/L (ref 3.5–5.3)
Sodium: 137 mmol/L (ref 135–146)

## 2016-03-30 LAB — POCT INR: INR: 2.4

## 2016-03-30 MED ORDER — RANOLAZINE ER 1000 MG PO TB12
1000.0000 mg | ORAL_TABLET | Freq: Two times a day (BID) | ORAL | Status: AC
Start: 2016-03-30 — End: ?

## 2016-03-31 ENCOUNTER — Encounter (HOSPITAL_COMMUNITY): Payer: PPO

## 2016-04-20 ENCOUNTER — Ambulatory Visit (INDEPENDENT_AMBULATORY_CARE_PROVIDER_SITE_OTHER): Payer: PPO | Admitting: Pharmacist Clinician (PhC)/ Clinical Pharmacy Specialist

## 2016-04-20 DIAGNOSIS — Z7901 Long term (current) use of anticoagulants: Secondary | ICD-10-CM | POA: Diagnosis not present

## 2016-04-20 DIAGNOSIS — I482 Chronic atrial fibrillation: Secondary | ICD-10-CM | POA: Diagnosis not present

## 2016-04-20 DIAGNOSIS — I4821 Permanent atrial fibrillation: Secondary | ICD-10-CM

## 2016-04-20 LAB — POCT INR: INR: 2.8

## 2016-04-27 ENCOUNTER — Emergency Department (HOSPITAL_COMMUNITY): Payer: PPO

## 2016-04-27 ENCOUNTER — Encounter (HOSPITAL_COMMUNITY): Payer: Self-pay | Admitting: *Deleted

## 2016-04-27 ENCOUNTER — Other Ambulatory Visit: Payer: Self-pay | Admitting: Cardiovascular Disease

## 2016-04-27 ENCOUNTER — Inpatient Hospital Stay (HOSPITAL_COMMUNITY)
Admission: EM | Admit: 2016-04-27 | Discharge: 2016-05-14 | DRG: 296 | Disposition: E | Payer: PPO | Attending: Emergency Medicine | Admitting: Emergency Medicine

## 2016-04-27 ENCOUNTER — Inpatient Hospital Stay (HOSPITAL_COMMUNITY): Payer: PPO

## 2016-04-27 DIAGNOSIS — A419 Sepsis, unspecified organism: Secondary | ICD-10-CM

## 2016-04-27 DIAGNOSIS — Z794 Long term (current) use of insulin: Secondary | ICD-10-CM | POA: Diagnosis not present

## 2016-04-27 DIAGNOSIS — E872 Acidosis: Secondary | ICD-10-CM | POA: Diagnosis present

## 2016-04-27 DIAGNOSIS — R579 Shock, unspecified: Secondary | ICD-10-CM | POA: Insufficient documentation

## 2016-04-27 DIAGNOSIS — T380X5A Adverse effect of glucocorticoids and synthetic analogues, initial encounter: Secondary | ICD-10-CM | POA: Diagnosis present

## 2016-04-27 DIAGNOSIS — E860 Dehydration: Secondary | ICD-10-CM | POA: Diagnosis present

## 2016-04-27 DIAGNOSIS — E875 Hyperkalemia: Secondary | ICD-10-CM | POA: Diagnosis present

## 2016-04-27 DIAGNOSIS — I252 Old myocardial infarction: Secondary | ICD-10-CM | POA: Diagnosis not present

## 2016-04-27 DIAGNOSIS — R570 Cardiogenic shock: Secondary | ICD-10-CM | POA: Diagnosis present

## 2016-04-27 DIAGNOSIS — J189 Pneumonia, unspecified organism: Secondary | ICD-10-CM

## 2016-04-27 DIAGNOSIS — Z7952 Long term (current) use of systemic steroids: Secondary | ICD-10-CM | POA: Diagnosis not present

## 2016-04-27 DIAGNOSIS — I130 Hypertensive heart and chronic kidney disease with heart failure and stage 1 through stage 4 chronic kidney disease, or unspecified chronic kidney disease: Secondary | ICD-10-CM | POA: Diagnosis present

## 2016-04-27 DIAGNOSIS — Z7901 Long term (current) use of anticoagulants: Secondary | ICD-10-CM | POA: Diagnosis not present

## 2016-04-27 DIAGNOSIS — Z9841 Cataract extraction status, right eye: Secondary | ICD-10-CM | POA: Diagnosis not present

## 2016-04-27 DIAGNOSIS — Z66 Do not resuscitate: Secondary | ICD-10-CM | POA: Diagnosis not present

## 2016-04-27 DIAGNOSIS — Z9842 Cataract extraction status, left eye: Secondary | ICD-10-CM

## 2016-04-27 DIAGNOSIS — I255 Ischemic cardiomyopathy: Secondary | ICD-10-CM | POA: Diagnosis present

## 2016-04-27 DIAGNOSIS — Z95810 Presence of automatic (implantable) cardiac defibrillator: Secondary | ICD-10-CM

## 2016-04-27 DIAGNOSIS — J986 Disorders of diaphragm: Secondary | ICD-10-CM | POA: Diagnosis present

## 2016-04-27 DIAGNOSIS — N183 Chronic kidney disease, stage 3 (moderate): Secondary | ICD-10-CM | POA: Diagnosis present

## 2016-04-27 DIAGNOSIS — I25119 Atherosclerotic heart disease of native coronary artery with unspecified angina pectoris: Secondary | ICD-10-CM | POA: Diagnosis present

## 2016-04-27 DIAGNOSIS — E039 Hypothyroidism, unspecified: Secondary | ICD-10-CM | POA: Diagnosis present

## 2016-04-27 DIAGNOSIS — E1165 Type 2 diabetes mellitus with hyperglycemia: Secondary | ICD-10-CM | POA: Diagnosis present

## 2016-04-27 DIAGNOSIS — I482 Chronic atrial fibrillation: Secondary | ICD-10-CM | POA: Diagnosis present

## 2016-04-27 DIAGNOSIS — R079 Chest pain, unspecified: Secondary | ICD-10-CM | POA: Diagnosis present

## 2016-04-27 DIAGNOSIS — I340 Nonrheumatic mitral (valve) insufficiency: Secondary | ICD-10-CM | POA: Diagnosis present

## 2016-04-27 DIAGNOSIS — I25810 Atherosclerosis of coronary artery bypass graft(s) without angina pectoris: Secondary | ICD-10-CM | POA: Diagnosis present

## 2016-04-27 DIAGNOSIS — E785 Hyperlipidemia, unspecified: Secondary | ICD-10-CM | POA: Diagnosis present

## 2016-04-27 DIAGNOSIS — Z961 Presence of intraocular lens: Secondary | ICD-10-CM | POA: Diagnosis present

## 2016-04-27 DIAGNOSIS — E1122 Type 2 diabetes mellitus with diabetic chronic kidney disease: Secondary | ICD-10-CM | POA: Diagnosis present

## 2016-04-27 DIAGNOSIS — J811 Chronic pulmonary edema: Secondary | ICD-10-CM

## 2016-04-27 DIAGNOSIS — I5043 Acute on chronic combined systolic (congestive) and diastolic (congestive) heart failure: Secondary | ICD-10-CM | POA: Diagnosis present

## 2016-04-27 DIAGNOSIS — J9601 Acute respiratory failure with hypoxia: Secondary | ICD-10-CM | POA: Diagnosis present

## 2016-04-27 DIAGNOSIS — K219 Gastro-esophageal reflux disease without esophagitis: Secondary | ICD-10-CM | POA: Diagnosis present

## 2016-04-27 DIAGNOSIS — Z96653 Presence of artificial knee joint, bilateral: Secondary | ICD-10-CM | POA: Diagnosis present

## 2016-04-27 DIAGNOSIS — M069 Rheumatoid arthritis, unspecified: Secondary | ICD-10-CM | POA: Diagnosis present

## 2016-04-27 DIAGNOSIS — I447 Left bundle-branch block, unspecified: Secondary | ICD-10-CM | POA: Diagnosis present

## 2016-04-27 DIAGNOSIS — I469 Cardiac arrest, cause unspecified: Principal | ICD-10-CM | POA: Diagnosis present

## 2016-04-27 DIAGNOSIS — I5042 Chronic combined systolic (congestive) and diastolic (congestive) heart failure: Secondary | ICD-10-CM | POA: Diagnosis present

## 2016-04-27 DIAGNOSIS — Z452 Encounter for adjustment and management of vascular access device: Secondary | ICD-10-CM

## 2016-04-27 DIAGNOSIS — N179 Acute kidney failure, unspecified: Secondary | ICD-10-CM | POA: Diagnosis present

## 2016-04-27 LAB — GLUCOSE, CAPILLARY
GLUCOSE-CAPILLARY: 245 mg/dL — AB (ref 65–99)
GLUCOSE-CAPILLARY: 192 mg/dL — AB (ref 65–99)
Glucose-Capillary: 232 mg/dL — ABNORMAL HIGH (ref 65–99)

## 2016-04-27 LAB — CBC
HCT: 30.4 % — ABNORMAL LOW (ref 39.0–52.0)
HEMATOCRIT: 46.8 % (ref 39.0–52.0)
Hemoglobin: 14.8 g/dL (ref 13.0–17.0)
Hemoglobin: 9.4 g/dL — ABNORMAL LOW (ref 13.0–17.0)
MCH: 32.5 pg (ref 26.0–34.0)
MCH: 31.9 pg (ref 26.0–34.0)
MCHC: 31.6 g/dL (ref 30.0–36.0)
MCHC: 30.9 g/dL (ref 30.0–36.0)
MCV: 102.6 fL — AB (ref 78.0–100.0)
MCV: 103.1 fL — ABNORMAL HIGH (ref 78.0–100.0)
PLATELETS: 129 10*3/uL — AB (ref 150–400)
Platelets: 171 10*3/uL (ref 150–400)
RBC: 4.56 MIL/uL (ref 4.22–5.81)
RBC: 2.95 MIL/uL — ABNORMAL LOW (ref 4.22–5.81)
RDW: 16.3 % — AB (ref 11.5–15.5)
RDW: 16.5 % — AB (ref 11.5–15.5)
WBC: 14 10*3/uL — ABNORMAL HIGH (ref 4.0–10.5)
WBC: 13.7 10*3/uL — ABNORMAL HIGH (ref 4.0–10.5)

## 2016-04-27 LAB — BASIC METABOLIC PANEL
ANION GAP: 10 (ref 5–15)
Anion gap: 17 — ABNORMAL HIGH (ref 5–15)
BUN: 15 mg/dL (ref 6–20)
BUN: 14 mg/dL (ref 6–20)
CO2: 22 mmol/L (ref 22–32)
CO2: 16 mmol/L — ABNORMAL LOW (ref 22–32)
Calcium: 9.2 mg/dL (ref 8.9–10.3)
Calcium: 6.1 mg/dL — CL (ref 8.9–10.3)
Chloride: 100 mmol/L — ABNORMAL LOW (ref 101–111)
Chloride: 116 mmol/L — ABNORMAL HIGH (ref 101–111)
Creatinine, Ser: 1.93 mg/dL — ABNORMAL HIGH (ref 0.61–1.24)
Creatinine, Ser: 1.7 mg/dL — ABNORMAL HIGH (ref 0.61–1.24)
GFR calc Af Amer: 37 mL/min — ABNORMAL LOW (ref >60)
GFR calc Af Amer: 43 mL/min — ABNORMAL LOW (ref >60)
GFR, EST NON AFRICAN AMERICAN: 32 mL/min — AB (ref >60)
GFR, EST NON AFRICAN AMERICAN: 37 mL/min — AB (ref >60)
GLUCOSE: 250 mg/dL — AB (ref 65–99)
Glucose, Bld: 228 mg/dL — ABNORMAL HIGH (ref 65–99)
POTASSIUM: 4 mmol/L (ref 3.5–5.1)
POTASSIUM: 3.9 mmol/L (ref 3.5–5.1)
SODIUM: 142 mmol/L (ref 135–145)
Sodium: 139 mmol/L (ref 135–145)

## 2016-04-27 LAB — CARBOXYHEMOGLOBIN
CARBOXYHEMOGLOBIN: 0.6 % (ref 0.5–1.5)
METHEMOGLOBIN: 1 % (ref 0.0–1.5)
O2 SAT: 48.6 %
TOTAL HEMOGLOBIN: 13.1 g/dL — AB (ref 13.5–18.0)

## 2016-04-27 LAB — I-STAT CG4 LACTIC ACID, ED
LACTIC ACID, VENOUS: 10.33 mmol/L — AB (ref 0.5–2.0)
Lactic Acid, Venous: 8.4 mmol/L (ref 0.5–2.0)

## 2016-04-27 LAB — I-STAT ARTERIAL BLOOD GAS, ED
ACID-BASE DEFICIT: 12 mmol/L — AB (ref 0.0–2.0)
Bicarbonate: 16.4 mEq/L — ABNORMAL LOW (ref 20.0–24.0)
O2 Saturation: 98 %
PCO2 ART: 44.7 mmHg (ref 35.0–45.0)
PO2 ART: 129 mmHg — AB (ref 80.0–100.0)
Patient temperature: 96.4
TCO2: 18 mmol/L (ref 0–100)
pH, Arterial: 7.164 — CL (ref 7.350–7.450)

## 2016-04-27 LAB — PROTIME-INR
INR: 2.1 — AB (ref 0.00–1.49)
INR: 3.13 — AB (ref 0.00–1.49)
PROTHROMBIN TIME: 23.4 s — AB (ref 11.6–15.2)
PROTHROMBIN TIME: 31.6 s — AB (ref 11.6–15.2)

## 2016-04-27 LAB — I-STAT TROPONIN, ED: Troponin i, poc: 0.06 ng/mL (ref 0.00–0.08)

## 2016-04-27 LAB — TROPONIN I
TROPONIN I: 1.81 ng/mL — AB (ref <0.031)
TROPONIN I: 5.75 ng/mL — AB (ref <0.031)

## 2016-04-27 LAB — MAGNESIUM: MAGNESIUM: 1.7 mg/dL (ref 1.7–2.4)

## 2016-04-27 LAB — HEPATIC FUNCTION PANEL
ALBUMIN: 3.6 g/dL (ref 3.5–5.0)
ALK PHOS: 96 U/L (ref 38–126)
ALT: 47 U/L (ref 17–63)
AST: 43 U/L — AB (ref 15–41)
BILIRUBIN TOTAL: 1.6 mg/dL — AB (ref 0.3–1.2)
Bilirubin, Direct: 0.5 mg/dL (ref 0.1–0.5)
Indirect Bilirubin: 1.1 mg/dL — ABNORMAL HIGH (ref 0.3–0.9)
Total Protein: 6.8 g/dL (ref 6.5–8.1)

## 2016-04-27 LAB — MRSA PCR SCREENING: MRSA by PCR: NEGATIVE

## 2016-04-27 LAB — BRAIN NATRIURETIC PEPTIDE: B Natriuretic Peptide: 930.3 pg/mL — ABNORMAL HIGH (ref 0.0–100.0)

## 2016-04-27 LAB — APTT: APTT: 30 s (ref 24–37)

## 2016-04-27 LAB — PROCALCITONIN: Procalcitonin: 1.23 ng/mL

## 2016-04-27 LAB — PHOSPHORUS: Phosphorus: 4.9 mg/dL — ABNORMAL HIGH (ref 2.5–4.6)

## 2016-04-27 MED ORDER — SUCCINYLCHOLINE CHLORIDE 20 MG/ML IJ SOLN
INTRAMUSCULAR | Status: AC | PRN
  Administered 2016-04-27: 135 mg via INTRAVENOUS

## 2016-04-27 MED ORDER — SODIUM CHLORIDE 0.9 % IV BOLUS (SEPSIS)
500.0000 mL | Freq: Once | INTRAVENOUS | Status: AC
  Administered 2016-04-28: 500 mL via INTRAVENOUS

## 2016-04-27 MED ORDER — DEXTROSE 5 % IV SOLN: 1.0000 g | Freq: Once | INTRAVENOUS | Status: DC

## 2016-04-27 MED ORDER — EPINEPHRINE HCL 1 MG/ML IJ SOLN
0.5000 ug/min | INTRAMUSCULAR | Status: DC
  Administered 2016-04-27: 3 ug/min via INTRAVENOUS
  Filled 2016-04-27: qty 4

## 2016-04-27 MED ORDER — FENTANYL CITRATE (PF) 100 MCG/2ML IJ SOLN
100.0000 ug | Freq: Once | INTRAMUSCULAR | Status: AC
  Administered 2016-04-27: 100 ug via INTRAVENOUS

## 2016-04-27 MED ORDER — DEXTROSE 5 % IV SOLN
0.5000 ug/min | INTRAVENOUS | Status: DC
  Administered 2016-04-27: 0.5 ug/min via INTRAVENOUS
  Filled 2016-04-27: qty 4

## 2016-04-27 MED ORDER — HYDROCORTISONE NA SUCCINATE PF 100 MG IJ SOLR
50.0000 mg | Freq: Four times a day (QID) | INTRAMUSCULAR | Status: DC
  Administered 2016-04-27 – 2016-04-28 (×3): 50 mg via INTRAVENOUS
  Filled 2016-04-27 (×3): qty 1
  Filled 2016-04-27: qty 2
  Filled 2016-04-27: qty 1

## 2016-04-27 MED ORDER — DEXTROSE 5 % IV SOLN: 500.0000 mg | Freq: Once | INTRAVENOUS | Status: DC

## 2016-04-27 MED ORDER — PIPERACILLIN-TAZOBACTAM 3.375 G IVPB: 3.3750 g | Freq: Three times a day (TID) | INTRAVENOUS | Status: DC

## 2016-04-27 MED ORDER — SODIUM CHLORIDE 0.9 % IV SOLN
1750.0000 mg | Freq: Once | INTRAVENOUS | Status: AC
  Administered 2016-04-27: 1750 mg via INTRAVENOUS
  Filled 2016-04-27: qty 1750

## 2016-04-27 MED ORDER — SODIUM CHLORIDE 0.9 % IV BOLUS (SEPSIS)
1000.0000 mL | Freq: Once | INTRAVENOUS | Status: AC
  Administered 2016-04-27: 1000 mL via INTRAVENOUS

## 2016-04-27 MED ORDER — ETOMIDATE 2 MG/ML IV SOLN
INTRAVENOUS | Status: AC | PRN
  Administered 2016-04-27: 27 mg via INTRAVENOUS

## 2016-04-27 MED ORDER — INSULIN ASPART 100 UNIT/ML ~~LOC~~ SOLN
0.0000 [IU] | SUBCUTANEOUS | Status: DC
  Administered 2016-04-27: 2 [IU] via SUBCUTANEOUS
  Administered 2016-04-28 (×2): 7 [IU] via SUBCUTANEOUS

## 2016-04-27 MED ORDER — VANCOMYCIN HCL 10 G IV SOLR: 1250.0000 mg | INTRAVENOUS | Status: DC

## 2016-04-27 MED ORDER — CALCIUM CHLORIDE 10 % IV SOLN
INTRAVENOUS | Status: AC | PRN
  Administered 2016-04-27: 1 g via INTRAVENOUS

## 2016-04-27 MED ORDER — ASPIRIN 300 MG RE SUPP: 300.0000 mg | RECTAL | Status: DC

## 2016-04-27 MED ORDER — DEXTROSE 5 % IV SOLN
1.0000 g | INTRAVENOUS | Status: DC
  Administered 2016-04-27: 1 g via INTRAVENOUS
  Filled 2016-04-27 (×2): qty 10

## 2016-04-27 MED ORDER — EPINEPHRINE HCL 0.1 MG/ML IJ SOSY
PREFILLED_SYRINGE | INTRAMUSCULAR | Status: AC
  Filled 2016-04-27: qty 10

## 2016-04-27 MED ORDER — EPINEPHRINE HCL 0.1 MG/ML IJ SOSY
PREFILLED_SYRINGE | INTRAMUSCULAR | Status: AC | PRN
  Administered 2016-04-27 (×2): 1 via INTRAVENOUS

## 2016-04-27 MED ORDER — ANTISEPTIC ORAL RINSE SOLUTION (CORINZ)
7.0000 mL | Freq: Four times a day (QID) | OROMUCOSAL | Status: DC
  Administered 2016-04-27 – 2016-04-28 (×3): 7 mL via OROMUCOSAL

## 2016-04-27 MED ORDER — LEVOFLOXACIN IN D5W 750 MG/150ML IV SOLN
750.0000 mg | Freq: Once | INTRAVENOUS | Status: AC
  Administered 2016-04-27: 750 mg via INTRAVENOUS
  Filled 2016-04-27: qty 150

## 2016-04-27 MED ORDER — SODIUM CHLORIDE 0.9 % IV SOLN: 250.0000 mL | INTRAVENOUS | Status: DC | PRN

## 2016-04-27 MED ORDER — ASPIRIN 81 MG PO CHEW: 324.0000 mg | CHEWABLE_TABLET | ORAL | Status: DC

## 2016-04-27 MED ORDER — DEXTROSE 5 % IV SOLN
500.0000 mg | INTRAVENOUS | Status: DC
  Filled 2016-04-27: qty 500

## 2016-04-27 MED ORDER — DEXTROSE 5 % IV SOLN
150.0000 mg | INTRAVENOUS | Status: AC | PRN
  Administered 2016-04-27: 150 mg via INTRAVENOUS

## 2016-04-27 MED ORDER — LEVOFLOXACIN IN D5W 750 MG/150ML IV SOLN: 750.0000 mg | INTRAVENOUS | Status: DC

## 2016-04-27 MED ORDER — SODIUM CHLORIDE 0.9 % IV SOLN
INTRAVENOUS | Status: DC
  Administered 2016-04-27: 17:00:00 via INTRAVENOUS

## 2016-04-27 MED ORDER — NOREPINEPHRINE BITARTRATE 1 MG/ML IV SOLN
0.0000 ug/min | INTRAVENOUS | Status: DC
  Administered 2016-04-27 (×2): 20 ug/min via INTRAVENOUS
  Filled 2016-04-27 (×3): qty 4

## 2016-04-27 MED ORDER — LEVOTHYROXINE SODIUM 175 MCG PO TABS
175.0000 ug | ORAL_TABLET | Freq: Every day | ORAL | Status: DC
  Filled 2016-04-27: qty 1

## 2016-04-27 MED ORDER — EPINEPHRINE HCL 0.1 MG/ML IJ SOSY
PREFILLED_SYRINGE | INTRAMUSCULAR | Status: DC | PRN
  Administered 2016-04-27: 0.1 mg via INTRAVENOUS

## 2016-04-27 MED ORDER — CHLORHEXIDINE GLUCONATE 0.12% ORAL RINSE (MEDLINE KIT)
15.0000 mL | Freq: Two times a day (BID) | OROMUCOSAL | Status: DC
  Administered 2016-04-27: 15 mL via OROMUCOSAL

## 2016-04-27 MED ORDER — FENTANYL CITRATE (PF) 100 MCG/2ML IJ SOLN
INTRAMUSCULAR | Status: AC
  Filled 2016-04-27: qty 2

## 2016-04-27 MED ORDER — WARFARIN SODIUM 5 MG PO TABS
5.0000 mg | ORAL_TABLET | Freq: Once | ORAL | Status: AC
  Administered 2016-04-27: 5 mg via ORAL
  Filled 2016-04-27: qty 1

## 2016-04-27 MED ORDER — PHENYLEPHRINE HCL 10 MG/ML IJ SOLN
30.0000 ug/min | INTRAVENOUS | Status: DC
  Administered 2016-04-27: 30 ug/min via INTRAVENOUS
  Administered 2016-04-28: 200 ug/min via INTRAVENOUS
  Filled 2016-04-27 (×2): qty 1

## 2016-04-27 MED ORDER — WARFARIN - PHARMACIST DOSING INPATIENT
Freq: Every day | Status: DC
  Administered 2016-04-27: 18:00:00

## 2016-04-27 MED ORDER — FENTANYL CITRATE (PF) 100 MCG/2ML IJ SOLN
50.0000 ug | INTRAMUSCULAR | Status: DC | PRN
  Administered 2016-04-27: 50 ug via INTRAVENOUS
  Filled 2016-04-27 (×2): qty 2

## 2016-04-27 MED ORDER — SODIUM BICARBONATE 8.4 % IV SOLN
INTRAVENOUS | Status: AC | PRN
  Administered 2016-04-27: 100 meq via INTRAVENOUS

## 2016-04-27 MED ORDER — DEXTROSE 5 % IV SOLN
1.0000 g | INTRAVENOUS | Status: DC
  Filled 2016-04-27: qty 10

## 2016-04-27 MED ORDER — MIDAZOLAM HCL 2 MG/2ML IJ SOLN: 1.0000 mg | INTRAMUSCULAR | Status: DC | PRN

## 2016-04-27 MED ORDER — FENTANYL CITRATE (PF) 100 MCG/2ML IJ SOLN
50.0000 ug | INTRAMUSCULAR | Status: DC | PRN
Start: 2016-04-27 — End: 2016-04-28

## 2016-04-27 MED ORDER — SODIUM CHLORIDE 0.9 % IV SOLN: INTRAVENOUS | Status: DC | PRN

## 2016-04-27 MED ORDER — FENTANYL CITRATE (PF) 100 MCG/2ML IJ SOLN
25.0000 ug | INTRAMUSCULAR | Status: DC | PRN
Start: 2016-04-27 — End: 2016-04-27

## 2016-04-27 MED ORDER — PIPERACILLIN-TAZOBACTAM 3.375 G IVPB 30 MIN
3.3750 g | Freq: Once | INTRAVENOUS | Status: AC
  Administered 2016-04-27: 3.375 g via INTRAVENOUS
  Filled 2016-04-27: qty 50

## 2016-04-27 MED ORDER — PANTOPRAZOLE SODIUM 40 MG IV SOLR
40.0000 mg | Freq: Every day | INTRAVENOUS | Status: DC
  Administered 2016-04-27: 40 mg via INTRAVENOUS
  Filled 2016-04-27 (×2): qty 40

## 2016-04-27 MED ORDER — FENTANYL CITRATE (PF) 100 MCG/2ML IJ SOLN
INTRAMUSCULAR | Status: AC | PRN
  Administered 2016-04-27: 100 ug via INTRAVENOUS

## 2016-04-27 NOTE — Progress Notes (Addendum)
Pharmacy Antibiotic Note  [name] is a 75 y.o. male admitted on May 18, 2016 with HAP/sepsis. Code sepsis called at 20-Apr-1207. Then patient coded, s/p CPR. Abx started 1242. Pharmacy has been consulted for vanc/zosyn dosing. Wbc 14.  Now Rx consulted to switch from V/Z to CTX and levaquin. CrCl~35.  Plan: Vanc/Zosyn d/c'd per admitting MD CTX 1g IV q24h Levo 750mg  IV q48h Monitor clinical progress, c/s, renal function, abx plan/LOT     No data recorded.   Recent Labs Lab 2016-05-18 1143 May 18, 2016 1154  WBC 14.0*  --   LATICACIDVEN  --  8.40*    CrCl cannot be calculated (Unknown ideal weight.).    Allergies  Allergen Reactions  . Butrans [Buprenorphine] Shortness Of Breath  . Influenza Vaccines Other (See Comments)    Severe coughing for weeks after vaccine; also had flu like symptoms for about 7 to 8 weeks.   . Lanoxin [Digoxin] Other (See Comments)    Nausea, anorexia, caused tremendous weight loss  . Lisinopril Other (See Comments)    cough  . Clarithromycin Other (See Comments)    Unknown, thinks "blisters in mouth"  . Statins Other (See Comments)    unknown    Antimicrobials this admission: 5/15 zosyn x1 5/15 vanc x1 5/15 CTX >> 5/15 levo >>  Dose adjustments this admission:   Microbiology results: 5/15 BCx:    6/15, PharmD, [facility] Clinical Pharmacist Pager [phone] May 18, 2016 12:11 PM

## 2016-04-27 NOTE — Code Documentation (Signed)
Epi gtt started at 27mcq/min

## 2016-04-27 NOTE — ED Notes (Signed)
Lost pulses. Resumed CPR

## 2016-04-27 NOTE — Procedures (Signed)
Central Venous Catheter Insertion Procedure Note [name] [id number] 03-May-1941  Procedure: Insertion of Central Venous Catheter Indications: Assessment of intravascular volume, Drug and/or fluid administration and Frequent blood sampling  Procedure Details Consent: Risks of procedure as well as the alternatives and risks of each were explained to the (patient/caregiver).  Consent for procedure obtained. Time Out: Verified patient identification, verified procedure, site/side was marked, verified correct patient position, special equipment/implants available, medications/allergies/relevent history reviewed, required imaging and test results available.  Performed Real time [place] was used to Identify and cannulate the right IJ vessel.  Maximum sterile technique was used including antiseptics, cap, gloves, gown, hand hygiene, mask and sheet. Skin prep: Chlorhexidine; local anesthetic administered A antimicrobial bonded/coated triple lumen catheter was placed in the right internal jugular vein using the Seldinger technique.  Evaluation Blood flow good Complications: No apparent complications Patient did tolerate procedure well. Chest X-ray ordered to verify placement.  CXR: pending.  [name] 07-May-2016, 4:47 PM

## 2016-04-27 NOTE — Procedures (Signed)
Intubation Procedure Note [name] [id number] [birth year]  Procedure: Intubation Indications: Respiratory insufficiency  Procedure Details Consent: Unable to obtain consent because of emergent medical necessity. Time Out: Verified patient identification, verified procedure, site/side was marked, verified correct patient position, special equipment/implants available, medications/allergies/relevent history reviewed, required imaging and test results available.  Performed  Maximum sterile technique was used including antiseptics, cap, gloves, gown, hand hygiene, mask and sheet.  MAC and 3    Evaluation Hemodynamic Status: BP stable throughout; O2 sats: stable throughout Patient's Current Condition: stable Complications: No apparent complications Patient did tolerate procedure well. Chest X-ray ordered to verify placement.  CXR: pending   Patient intubated by ED MD without complications.  Positive color change.  Condensation noted in tube.  Bilateral breath sounds noted.  Chest xray pending.     [name] 04/26/2016

## 2016-04-27 NOTE — ED Notes (Signed)
Pt opening eyes, moving arms, nodding head.

## 2016-04-27 NOTE — Consult Note (Addendum)
Advanced Heart Failure Team Consult Note  Referring Physician: Dr Lamonte Sakai  Primary Physician: Dr Noah Delaine  Primary Cardiologist:  Dr Aundra Dubin  Reason for Consultation: Heart Failure   HPI:   [name] is a 75 yo male with a history of HTN, DM2, HLD, CAD s/p CABG 1998, ischemic cardiomyopathy, chronic angina, chronic atrial fibrillation, LBBB, rheumatoid arthritis on chronic steroids, and severe spinal stenosis. He has a chronically elevated left hemidiaphragm. He has left hip pain with adductor muscle tears on prior MRI. In 2015 he had cardiogenic shock in the setting of NSTEMI and required short term IABP + milrinone.   Most recent hospital admit--> March 2017 with chest pain. Had [place] with similar anatomy to prior cath, medically managed.   Followed in the HF clinic and was last seen 4/6. At that time he was stable and torsemide was cut back to to 40 mg daily and alternating with 20 mg. His wife says over the 5 days he has had increased dyspnea and leg edema so she increased torsemide to 40 mg daily. Weight up from 202 to 212 pounds. Today he had increased dyspnea and fatigue.   Earlier today he presented to [facility] ED with increased dyspnea and chest pain. Had PEA arrest x2 (~9 minutes)  Intubated and on epi drip. Lactic acid was 8.4,. Blood cultures x2 obtained. Started on vanc and zosyn.   LHC 03/02/2016   Prox LAD to Mid LAD lesion, 100% stenosed.  Mid Cx to Dist Cx lesion, 75% stenosed.  1st Mrg lesion, 75% stenosed.  Ost RPDA lesion, 100% stenosed.  LIMA - LAD patent.  SVG - OM1 patent.  SVG - RCA totally occluded.  Prox RCA lesion, 40% stenosed. Mid RCA to Dist RCA lesion, 40% stenosed.  Echo (3/17): EF 20-25%, inferolateral akinesis, mild AI, moderate MR, mildly decreased RV systolic function  Review of Systems: [y] = yes, _0  = no  Intubated information obtain from wife.   General: Weight gain [ Y]; Weight loss _1 ; Anorexia _2 ; Fatigue [ Y]; Fever _3 ; Chills _4 ;  Weakness [Y ]  Cardiac: Chest pain/pressure [Y ]; Resting SOB [Y ]; Exertional SOB [Y ]; Orthopnea [Y ]; Pedal Edema [Y ]; Palpitations _5 ; Syncope _6 ; Presyncope _7 ; Paroxysmal nocturnal dyspnea_8   Pulmonary: Cough _9 ; Wheezing_10 ; Hemoptysis_11 ; Sputum _12 ; Snoring _13   GI: Vomiting_14 ; Dysphagia_15 ; Melena_16 ; Hematochezia _17 ; Heartburn_18 ; Abdominal pain _19 ; Constipation _20 ; Diarrhea _21 ; BRBPR _22   GU: Hematuria_23 ; Dysuria _24 ; Nocturia_25   Vascular: Pain in legs with walking _26 ; Pain in feet with lying flat _27 ; Non-healing sores _28 ; Stroke _29 ; TIA _30 ; Slurred speech _31 ;  Neuro: Headaches_32 ; Vertigo_33 ; Seizures_34 ; Paresthesias_35 ;Blurred vision _36 ; Diplopia _37 ; Vision changes _38   Ortho/Skin: Arthritis _39 ; Joint pain [ Y]; Muscle pain _40 ; Joint swelling _41 ; Back Pain _42 ; Rash _43   Psych: Depression_44 ; Anxiety_45   Heme: Bleeding problems _46 ; Clotting disorders _47 ; Anemia _48   Endocrine: Diabetes _49 ; Thyroid dysfunction_50   Home Medications Prior to Admission medications   Medication Sig Start Date End Date Taking? Authorizing Provider  atorvastatin (LIPITOR) 40 MG tablet Take 40 mg by mouth daily. 09/09/15  Yes Historical Provider, MD  fluticasone (FLONASE) 50 MCG/ACT nasal spray Place 2 sprays into both nostrils daily.  Yes Historical Provider, MD  hydrALAZINE (APRESOLINE) 25 MG tablet Take 1.5 tablets (37.5 mg total) by mouth 3 (three) times daily. 01/23/16  Yes Larey Dresser, MD  insulin lispro (HUMALOG) 100 UNIT/ML injection Inject 10 Units into the skin 2 (two) times daily before a meal.    Yes Historical Provider, MD  isosorbide mononitrate (IMDUR) 60 MG 24 hr tablet Take 2 tablets (120 mg total) by mouth daily. 01/23/16  Yes Larey Dresser, MD  levothyroxine (SYNTHROID, LEVOTHROID) 175 MCG tablet Take 175 mcg by mouth daily before breakfast.   Yes Historical Provider, MD  metoprolol succinate (TOPROL-XL) 100 MG 24 hr tablet TAKE 1 & 1/2 TABLETS BY MOUTH  TWICE A DAY 11/26/15  Yes Amber Sena Slate, NP  nitroGLYCERIN (NITROSTAT) 0.4 MG SL tablet PLACE 1 TABLET UNDER THE TONGUE EVERY 5 MINS AS NEEDED FOR CHEST PAIN 04/14/2016  Yes Lorretta Harp, MD  nystatin cream (MYCOSTATIN) Apply 1 application topically daily as needed for dry skin.  03/19/15  Yes Historical Provider, MD  Omega-3 Fatty Acids (FISH OIL) 1000 MG CAPS Take 3,000 mg by mouth every morning.   Yes Historical Provider, MD  omeprazole (PRILOSEC) 20 MG capsule Take 20 mg by mouth daily.   Yes Historical Provider, MD  oxyCODONE-acetaminophen (PERCOCET/ROXICET) 5-325 MG per tablet Take 1-2 tablets by mouth every 4 (four) hours as needed for severe pain. Reported on 12/03/2015   Yes Historical Provider, MD  polyethylene glycol (MIRALAX / GLYCOLAX) packet Take 17 g by mouth daily as needed. Patient taking differently: Take 17 g by mouth daily as needed (CONSTIPATION).  11/30/14  Yes Rhonda G Barrett, PA-C  predniSONE (DELTASONE) 5 MG tablet Take 1 tablet by mouth 2 (two) times daily. 06/04/15  Yes Historical Provider, MD  ranolazine (RANEXA) 1000 MG SR tablet Take 1 tablet (1,000 mg total) by mouth 2 (two) times daily. 03/30/16  Yes Lorretta Harp, MD  torsemide (DEMADEX) 20 MG tablet Take 1 tablet (20 mg total) by mouth daily. 03/23/16  Yes Larey Dresser, MD  TOUJEO SOLOSTAR 300 UNIT/ML SOPN Inject 32 Units into the skin every morning.  02/11/15  Yes Historical Provider, MD  triamcinolone cream (KENALOG) 0.1 % Apply 1 application topically daily as needed (for rash).  02/20/13  Yes Historical Provider, MD  warfarin (COUMADIN) 5 MG tablet ON 3/22 take 10 mg then resume home regimen 3/23. Patient taking differently: Take 5-7.5 mg by mouth See admin instructions. Pt takes 57m on MWF, takes 7.512mTu, Th, Sa, Su 03/04/16  Yes Amy D Estrella DeedsNP    Past Medical History: Past Medical History  Diagnosis Date  . MI (myocardial infarction) (HCPrien    "10/14/2014 I was told I'd had 2 MI's & didn't even know it"  (11/27/2014)  . Coronary artery disease involving native coronary artery     a. CABG 1998. b. MYOVIEW 11/2015: Large, Severe FIXED Defect in Cx distribution +/- dLAD.  SCAR w/o Ishcemia.   . 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 NSTEMI 02/2013: Progression of dz in SVG-RPDA followed by severe stenosis in grafted vessel, not likely safe for PCI - med rx. b. NSTEMI 10/2014: patent LIMA-LAD and SVG to OM, TO SVG-PDA with TO PDA and 60% proximal RCA, some left =>PDA collaterals; culprit was likely occlusion of SVG-PDA though this may have been subacute given some collaterals.  . Chronic chest pain   . Cardiomyopathy, ischemic 1998    s/p ICD;  Intolerant of Ranexa.  . Chronic combined systolic and diastolic CHF, NYHA class 3 (HCC)     a. EF prev 25%, improved to 45-50% in 2014. b. 10/2014: EF back downt o 25%.  . Chronic atrial fibrillation ([place])     Rate controlled - Now with ICD/PPM; On Coumadin  . Digitalis toxicity   . Hyperkalemia     a. Spironolactone d/c'd due to this.  [name] LBBB (left bundle branch block)   . Moderate mitral regurgitation by prior echocardiogram 10/2014 echo  . CKD (chronic kidney disease), stage III   . Hyperlipidemia LDL goal <70   . Moderate mitral regurgitation 10/2014 echo  . Type II diabetes mellitus ([place])   . Rheumatoid arthritis ([place])   . Long term current use of systemic steroids     for RA  . Hypothyroidism   . 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 Pleuritis, purulent ([place])     h/o------ with negative vats biopsy; 1994  . 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 Spinal stenosis     Very limited mobility - almost wheelchair bound  . Hip pain     a. adductor muscle tears on prior MRI.  [name] GERD (gastroesophageal reflux disease)     Past Surgical History: Past Surgical History  Procedure Laterality Date  .  Lung surgery  1994    "infection in lungs; scraped it out on right; 6 wks later they did left; clipped left diaphragm & paralyzed it"  . Total knee arthroplasty Bilateral 2000's  . Carpal tunnel release Bilateral 1990's  . Cataract extraction w/ intraocular lens  implant, bilateral Bilateral 1990's  . Cardiac catheterization Left 01/11/2013    Severe CAD with notable progression of disease in the SVG-RPDA followed by severe stenosis in grafted vessel, region not safely "reachede" for PCI, moderate ischemic CM no notable chage in EF, continue medical therapy  . Cardiac catheterization  04/03/2011    Left circumflex 95% stenosis, essentially unchanged from last cath  . Cardiac catheterization  04/01/2010    LAD 99% proximal stenosis with vague flow, circumflex 80% stenosis, essentially unchanged form last cath  . Cardiac catheterization  03/28/2010    Mild pulmonary hypertension, medical therapy  . Cardiac catheterization  04/30/2009    LAD 95% stenosis, essentially unchanged fomr last cath, medical therapy  . Cardiac catheterization  12/05/2008    2.5x12 apex, predilation in the distal PLA branch, 3x23 XIENCE V durg eluting stent and post dilated with a 3.25x20  VOYAGER at 15 atmospheres  . Left heart catheterization with coronary/graft angiogram N/A 01/13/2013    Procedure: LEFT HEART CATHETERIZATION WITH Beatrix Fetters;  Surgeon: Leonie Man, MD;  Location: [facility] CATH LAB;  Service: Cardiovascular;  Laterality: N/A;  . Left and right heart catheterization with coronary/graft angiogram N/A 10/16/2014    Procedure: LEFT AND RIGHT HEART CATHETERIZATION WITH Beatrix Fetters;  Surgeon: Leonie Man, MD;  Location: [facility] CATH LAB;  Service: Cardiovascular;  Laterality: N/A;  . Coronary artery bypass graft  05/1997    "CABG X7"  . Patella fracture surgery Right "before replacement"  . Knee arthroscopy Left "before replacement  . Ep implantable device N/A 05/06/2015    Procedure: BiV  ICD Insertion CRT-D;  Surgeon: Evans Lance, MD;  Location: [place] CV LAB;  Service: Cardiovascular;  Laterality: N/A;  . Nm myoview ltd  11/2015     Large, Severe FIXED Defect in Cx distribution +/- dLAD.  SCAR w/o Ishcemia.   . Cardiac catheterization N/A 03/02/2016    Procedure: Left Heart Cath and Cors/Grafts Angiography;  Surgeon: Lorretta Harp, MD;  Location: [place] CV LAB;  Service: Cardiovascular;  Laterality: N/A;    Family History: Family History  Problem Relation Age of Onset  . Heart disease Mother   . Heart disease Father   . Heart disease Brother   . Heart disease Maternal Aunt   . Cancer      Social History: Social History   Social History  . Marital Status: Married    Spouse Name: N/A  . Number of Children: N/A  . Years of Education: N/A   Social History Main Topics  . Smoking status: Never Smoker   . Smokeless tobacco: Never Used  . Alcohol Use: No  . Drug Use: No  . Sexual Activity: Not Currently   Other Topics Concern  . None   Social History Narrative    Allergies:  Allergies  Allergen Reactions  . Butrans [Buprenorphine] Shortness Of Breath  . Influenza Vaccines Other (See Comments)    Severe coughing for weeks after vaccine; also had flu like symptoms for about 7 to 8 weeks.   . Lanoxin [Digoxin] Other (See Comments)    Nausea, anorexia, caused tremendous weight loss  . Lisinopril Other (See Comments)    cough  . Clarithromycin Other (See Comments)    Unknown, thinks "blisters in mouth"  . Statins Other (See Comments)    unknown    Objective:    Vital Signs:   Temp:  [96.4 F (35.8 C)] 96.4 F (35.8 C) (05/15 1220) Pulse Rate:  [53-146] 73 (05/15 1430) Resp:  [7-40] 20 (05/15 1430) BP: (90-172)/(64-116) 104/89 mmHg (05/15 1430) SpO2:  [90 %-100 %] 96 % (05/15 1430) FiO2 (%):  [70 %-100 %] 70 % (05/15 1406) Weight:  [202 lb 9.6 oz (91.9 kg)] 202 lb 9.6 oz (91.9 kg) (05/15 1203)    Weight change: Filed Weights    05/11/2016 1203  Weight: 202 lb 9.6 oz (91.9 kg)    Intake/Output:   Intake/Output Summary (Last 24 hours) at 04/16/2016 1445 Last data filed at 04/13/2016 1418  Gross per 24 hour  Intake   3000 ml  Output    250 ml  Net   2750 ml     Physical Exam: General:  Intubated HEENT: normal Neck: supple. JVP to jaw  . Carotids 2+ bilat; no bruits. No lymphadenopathy or thryomegaly appreciated. Cor: PMI nondisplaced. Irregular rate & rhythm. No rubs, gallops or murmurs. Lungs: clear Abdomen: soft, nontender, nondistended. No hepatosplenomegaly. No bruits or masses. Good bowel sounds. Extremities: no cyanosis, clubbing, rash, R and LLE cool 2+ edema.  Neuro: Intubated follows commands. Moves all 4 extremities w/o difficulty.   Telemetry: A fib 90-100s  Labs: Basic Metabolic Panel:  Recent Labs Lab 04/20/2016 1143  NA 139  K 4.0  CL 100*  CO2 22  GLUCOSE 250*  BUN 15  CREATININE 1.93*  CALCIUM 9.2    Liver Function Tests:  Recent Labs Lab 04/22/2016 1143  AST 43*  ALT 47  ALKPHOS 96  BILITOT 1.6*  PROT 6.8  ALBUMIN 3.6   No results for input(s): LIPASE, AMYLASE in the last 168 hours. No results for input(s): AMMONIA in the last 168 hours.  CBC:  Recent Labs Lab 04/20/2016 1143  WBC 14.0*  HGB 14.8  HCT  46.8  MCV 102.6*  PLT 171    Cardiac Enzymes: No results for input(s): CKTOTAL, CKMB, CKMBINDEX, TROPONINI in the last 168 hours.  BNP: BNP (last 3 results)  Recent Labs  01/23/16 1600 02/27/16 1520 04/20/2016 1143  BNP 759.4* 1124.9* 930.3*    ProBNP (last 3 results) No results for input(s): PROBNP in the last 8760 hours.   CBG: No results for input(s): GLUCAP in the last 168 hours.  Coagulation Studies:  Recent Labs  05/01/2016 1143  LABPROT 23.4*  INR 2.10*    Other results: EKG:A fib RVR 148 bpm  Imaging: Dg Chest Portable 1 View  04/15/2016  CLINICAL DATA:  Shortness of breath, chest pain. EXAM: PORTABLE CHEST 1 VIEW COMPARISON:  Same  day. FINDINGS: Stable cardiomediastinal silhouette. Interval placement of nasogastric tube which is seen entering stomach. Endotracheal tube is seen projected over tracheal air shadow with distal tip 3.5 cm above the carina. Left-sided pacemaker is unchanged in position. Increased central pulmonary vascular congestion and probable bilateral perihilar edema is noted. No pneumothorax is noted. Bony thorax is unremarkable. IMPRESSION: Endotracheal and nasogastric tubes in grossly good position. Increased central pulmonary vascular congestion and bilateral perihilar pulmonary edema is noted. Electronically Signed   By: Marijo Conception, M.D.   On: 04/20/2016 14:09   Dg Chest Portable 1 View  05/08/2016  CLINICAL DATA:  Shortness of breath and weakness EXAM: PORTABLE CHEST 1 VIEW COMPARISON:  02/27/2016 FINDINGS: Cardiac shadow is stable. A defibrillator is again seen and stable. The lungs are hypoinflated but clear. No bony abnormality is seen. IMPRESSION: No active disease. Electronically Signed   By: Inez Catalina M.D.   On: 05/07/2016 12:10      Medications:     Current Medications: . antiseptic oral rinse  7 mL Mouth Rinse QID  . aspirin  324 mg Per Tube NOW   Or  . aspirin  300 mg Rectal NOW  . chlorhexidine gluconate (SAGE KIT)  15 mL Mouth Rinse BID  . hydrocortisone sod succinate (SOLU-CORTEF) inj  50 mg Intravenous Q6H  . insulin aspart  0-20 Units Subcutaneous Q4H  . [START ON 05/08/2016] levothyroxine  175 mcg Per Tube QAC breakfast  . pantoprazole (PROTONIX) IV  40 mg Intravenous Daily     Infusions: . sodium chloride    . sodium chloride    . epinephrine    . vancomycin 1,750 mg (05/01/2016 1305)      Assessment:  1. PEA- S/P CPR ~9 minutes 2. Acute Respiratory Failure 3. Shock -? Septic versus Cardiogenic 4. A/C Systolic Heart Failure 5.CKD Stage III 6. Chronic A fib RVR  7. CAD - H/O CABG- most recent LHC 02/28/16 without anatomic change 8. DNR    Plan/Discussion:    Mr Hofferber is a 75 year old well known to the HF team admitted after PEA x2 and acute respiratory failure. On epi drip with pending central line placement.   Lactic Acid 8.4>10.3.  Blood cultures obtained on vanc and zosyn. Also concerned he has cardiogenic shock. Check CO-OX/CVP once central line placed. Needs IV diuresis once line placed. No bb with shock.   Watch rate with A fib. May need amio drip to control rate. Has been on coumadin. INR 2.1. Can start heparin tomorrow.   Watch renal function closely.    Length of Stay: 0  Amy Clegg NP-C  04/25/2016, 2:45 PM  Advanced Heart Failure Team Pager [phone] (M-F; 7a - 4p)  Please contact [facility] for night-coverage  after hours (4p -7a ) and weekends on [email or web address]  Patient seen and examined with Darrick Grinder, NP. We discussed all aspects of the encounter. I agree with the assessment and plan as stated above.   He has profound shock s/p PEA arrest. He remains very acidotic. Suspect this is cardiogenic. CCM on board. Agree with getting central access. Support with epinephrine as you are doing. Recent cath reviewed and no targets for revascularization so no role for acute cath.    Prognosis is extremely poor. Given comorbidities likely not candidate for mechanical support. Will d/w Dr. Aundra Dubin who knows him well.   Daniel Gherardi,MD 4:03 PM  Addendum:  Co-ox 48% on epi 3 and levophed 25. Awake on vent. SBP 60-70s. CVP 21. Discussed with wife and daughter severity of situation and that he is unlikely to survive this admission. Suspect he will develop multi-system organ failure. Will continue supportive care for now but he is now DNR and will not perform CPR or defibrillation in case of recurrent arrest.  Chadwin Fury,MD 5:55 PM

## 2016-04-27 NOTE — H&P (Signed)
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 He is essentially bed/chair bound for last 2-3 yrs. Last seen by cardiology 4/6. Had cut back on diuretics. Was in typical state of health until about a week prior to presentation when wife noted he was having increased LE swelling so as instructed she started to go back up on his diuretics. Then on 5/12 she started to not he had increased NP cough, and was just "not looking himself". These symptoms persisted so the family brought him to the ER on 5/15. On arrival he was noted to have increased work of breathing and was pale and dusky. He then had witnessed PEA arrest. This occurred twice in the ER. He had a total of 9 minutes ACLS, first round 7 minutes, second round 2. Was intubated, remained in circulatory shock s/p ROSC. 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; Chronic chest pain; Cardiomyopathy, ischemic (1998); Chronic combined systolic and diastolic CHF, NYHA class 3 (HCC); Chronic atrial fibrillation (HCC); Digitalis toxicity; Hyperkalemia; LBBB (left bundle branch block); Moderate mitral regurgitation by prior echocardiogram (10/2014 echo); CKD (chronic kidney disease), stage III; Hyperlipidemia LDL goal <70; Moderate mitral regurgitation (10/2014 echo); Type II diabetes mellitus (HCC); Rheumatoid arthritis (HCC); Long term current use of systemic  steroids; Hypothyroidism; Lung disease (1994); Pleuritis, purulent (HCC); Diaphragmatic paralysis; Spinal stenosis; Hip pain; and GERD (gastroesophageal reflux disease).  PAST SURGICAL HISTORY: He  has past surgical history that includes Lung surgery (1994); Total knee arthroplasty (Bilateral, 2000's); Carpal tunnel release (Bilateral, 1990's); Cataract extraction w/ intraocular lens  implant, bilateral (Bilateral, 1990's); Cardiac catheterization (Left, 01/11/2013); Cardiac catheterization (04/03/2011); Cardiac catheterization (04/01/2010); Cardiac catheterization (03/28/2010); Cardiac catheterization (04/30/2009); Cardiac catheterization (12/05/2008); left heart catheterization with coronary/graft angiogram (N/A, 01/13/2013); left and right heart catheterization with coronary/graft angiogram (N/A, 10/16/2014); Coronary artery bypass graft (05/1997); Patella fracture surgery (Right, "before replacement"); Knee arthroscopy (Left, "before replacement); Cardiac catheterization (N/A, 05/06/2015); NM MYOVIEW LTD (11/2015); and Cardiac catheterization (N/A, 03/02/2016).  Allergies  Allergen Reactions  . Butrans [Buprenorphine] Shortness Of Breath  . Influenza Vaccines Other (See Comments)    Severe coughing for weeks after vaccine; also had flu like symptoms for about 7 to 8 weeks.   . Lanoxin [Digoxin] Other (See Comments)    Nausea, anorexia, caused tremendous weight loss  . Lisinopril Other (See Comments)    cough  . Clarithromycin Other (See Comments)    Unknown, thinks "blisters in mouth"  . Statins Other (See Comments)    unknown    No current facility-administered medications on file prior to encounter.   Current Outpatient Prescriptions on File Prior to Encounter  Medication Sig  . atorvastatin (LIPITOR) 40 MG tablet Take 40 mg by mouth daily.  . fluticasone (FLONASE) 50 MCG/ACT nasal spray Place 2 sprays into both nostrils daily.   . hydrALAZINE (APRESOLINE) 25 MG tablet Take 1.5 tablets (37.5 mg  total) by mouth 3 (  three) times daily.  . insulin lispro (HUMALOG) 100 UNIT/ML injection Inject 10 Units into the skin 2 (two) times daily before a meal.   . isosorbide mononitrate (IMDUR) 60 MG 24 hr tablet Take 2 tablets (120 mg total) by mouth daily.  [name] levothyroxine (SYNTHROID, LEVOTHROID) 175 MCG tablet Take 175 mcg by mouth daily before breakfast.  . metoprolol succinate (TOPROL-XL) 100 MG 24 hr tablet TAKE 1 & 1/2 TABLETS BY MOUTH TWICE A DAY  . nitroGLYCERIN (NITROSTAT) 0.4 MG SL tablet PLACE 1 TABLET UNDER THE TONGUE EVERY 5 MINS AS NEEDED FOR CHEST PAIN  . nystatin cream (MYCOSTATIN) Apply 1 application topically daily as needed for dry skin.   . Omega-3 Fatty Acids (FISH OIL) 1000 MG CAPS Take 3,000 mg by mouth every morning.  [name] omeprazole (PRILOSEC) 20 MG capsule Take 20 mg by mouth daily.  [name] oxyCODONE-acetaminophen (PERCOCET/ROXICET) 5-325 MG per tablet Take 1-2 tablets by mouth every 4 (four) hours as needed for severe pain. Reported on 12/03/2015  . polyethylene glycol (MIRALAX / GLYCOLAX) packet Take 17 g by mouth daily as needed. (Patient taking differently: Take 17 g by mouth daily as needed (CONSTIPATION). )  . predniSONE (DELTASONE) 5 MG tablet Take 1 tablet by mouth 2 (two) times daily.  . ranolazine (RANEXA) 1000 MG SR tablet Take 1 tablet (1,000 mg total) by mouth 2 (two) times daily.  [name] torsemide (DEMADEX) 20 MG tablet Take 1 tablet (20 mg total) by mouth daily.  Nathen May SOLOSTAR 300 UNIT/ML SOPN Inject 32 Units into the skin every morning.   . triamcinolone cream (KENALOG) 0.1 % Apply 1 application topically daily as needed (for rash).   . warfarin (COUMADIN) 5 MG tablet ON 3/22 take 10 mg then resume home regimen 3/23.    FAMILY HISTORY:  His indicated that his mother is deceased. He indicated that his father is deceased. He indicated that his maternal grandmother is deceased. He indicated that his maternal grandfather is deceased. He indicated that his paternal  grandmother is deceased. He indicated that his paternal grandfather is deceased.   SOCIAL HISTORY: He  reports that he has never smoked. He has never used smokeless tobacco. He reports that he does not drink alcohol or use illicit drugs.  REVIEW OF SYSTEMS:   unable  SUBJECTIVE:  Sedated on vent   VITAL SIGNS: BP 108/79 mmHg  Pulse 69  Temp(Src) 96.4 F (35.8 C) (Rectal)  Resp 14  Ht 6' (1.829 m)  Wt 202 lb 9.6 oz (91.9 kg)  BMI 27.47 kg/m2  SpO2 99%  HEMODYNAMICS:    VENTILATOR SETTINGS: Vent Mode:  [-] PRVC FiO2 (%):  [70 %-100 %] 70 % Set Rate:  [14 bmp-20 bmp] 20 bmp Vt Set:  [630 mL] 630 mL PEEP:  [5 cmH20] 5 cmH20 Plateau Pressure:  [20 cmH20] 20 cmH20  INTAKE / OUTPUT:    PHYSICAL EXAMINATION: General:  Chronically ill appearing white male, awake on vent  Neuro:  Opens eyes, follows commands. No focal def  HEENT:  Orally intubated, EOMI, no JVd   Cardiovascular:  Regular irreg  Lungs:  Diffuse course rhonchi, + tactile frem  Abdomen:  Soft, not tender + bowel sounds  Musculoskeletal:  Equal st and bulk  Skin:  Warm and dry   LABS:  BMET  Recent Labs Lab 05/04/2016 1143  NA 139  K 4.0  CL 100*  CO2 22  BUN 15  CREATININE 1.93*  GLUCOSE 250*    Electrolytes  Recent  Labs Lab 04/13/2016 1143  CALCIUM 9.2    CBC  Recent Labs Lab 04/21/2016 1143  WBC 14.0*  HGB 14.8  HCT 46.8  PLT 171    Coag's  Recent Labs Lab 04/13/2016 1143  APTT 30  INR 2.10*    Sepsis Markers  Recent Labs Lab 05/06/2016 1154  LATICACIDVEN 8.40*    ABG  Recent Labs Lab 05/07/2016 1359  PHART 7.164*  PCO2ART 44.7  PO2ART 129.0*    Liver Enzymes  Recent Labs Lab 04/18/2016 1143  AST 43*  ALT 47  ALKPHOS 96  BILITOT 1.6*  ALBUMIN 3.6    Cardiac Enzymes No results for input(s): TROPONINI, PROBNP in the last 168 hours.  Glucose No results for input(s): GLUCAP in the last 168 hours.  Imaging Dg Chest Portable 1 View  04/20/2016  CLINICAL  DATA:  Shortness of breath, chest pain. EXAM: PORTABLE CHEST 1 VIEW COMPARISON:  Same day. FINDINGS: Stable cardiomediastinal silhouette. Interval placement of nasogastric tube which is seen entering stomach. Endotracheal tube is seen projected over tracheal air shadow with distal tip 3.5 cm above the carina. Left-sided pacemaker is unchanged in position. Increased central pulmonary vascular congestion and probable bilateral perihilar edema is noted. No pneumothorax is noted. Bony thorax is unremarkable. IMPRESSION: Endotracheal and nasogastric tubes in grossly good position. Increased central pulmonary vascular congestion and bilateral perihilar pulmonary edema is noted. Electronically Signed   By: Lupita Raider, M.D.   On: 05/02/2016 14:09   Dg Chest Portable 1 View  05/05/2016  CLINICAL DATA:  Shortness of breath and weakness EXAM: PORTABLE CHEST 1 VIEW COMPARISON:  02/27/2016 FINDINGS: Cardiac shadow is stable. A defibrillator is again seen and stable. The lungs are hypoinflated but clear. No bony abnormality is seen. IMPRESSION: No active disease. Electronically Signed   By: Alcide Clever M.D.   On: 04/17/2016 12:10     STUDIES:  Echo 5/15>>>  CULTURES: sputum 5/15>>> uc 5/15>>> bcv x2 5/15>>>  ANTIBIOTICS: Rocephin 5/15>>> levaquin 5/15>>>  SIGNIFICANT EVENTS: Admitted s/p cardiac arrest. Circulatory shock after ROSC. Made DNR but aggressive medical rx   LINES/TUBES: 5/15 OETT>>>  DISCUSSION: 75 year old male w/ sig cardiac dz (gd 3 d-dysfxn, ICM EF 20-25%,), AF. Now s/p PEA arrest. Down time ~ 9 minutes total but MS appears intact. He remains in shock. Favor cardiogenic over sepsis burt will empirically cover as we await culture data. Will admit to ICU, ask heart failure team to assess and cont supportive care. Will need central access to assist w/ hemodynamics and safe route for pressor administration.,   ASSESSMENT / PLAN:  PULMONARY A: Acute respiratory failure Ventilator  dependence R/o PNA (doubt) Pulmonary edema  Chronic left HD paralysis  P:   Full vent support PAD Protocol Ck sputum, trend BNP Lasix when shock status will allow   CARDIOVASCULAR A:  Circulatory shock. Meets SIRS criteria but story not c/w w/ sepsis prodrome. Favor cardiogenic.  H/o LBBB Ischemic CM w EF 20-25% Afib  P:  Central access  KVO for now until we know filling pressures CVP goal 8-12 MAP goal >65 (will use pressors as indicated-->try to wean epi off first) Ck cortisol -->low threshold for empiric stress dose steroids   RENAL A:   CKD stage III (baseline scr 1.4-2.1) Lactic acidosis   P:   Repeat lactic acid (to be sure he is clearing) Strict I&O Renal dose meds Will need diuresis   GASTROINTESTINAL A:   H/o GERD P:   SUP w/ppi  RD consult   HEMATOLOGIC A:   Macrocytic anemia  Coumadin induced coagulopathy  P:  Pharmacy to a/w coumadin dosing Trend cbc Transfuse per ICU protocol   INFECTIOUS A:   SIRS r/o sepsis P:   Pan culture 5/15 (see above) Empiric abx 5/15 (see above w/ cough will rx as possible CAP)  ENDOCRINE A:   DM 2 w/ hyperglycemia Hypothyroidism   P:   Ck tsh ssi  NEUROLOGIC A:   Sedation -suprisingly intact s/p total of 9 minutes ACLS P:   RASS goal: 0 top -2 Avoid fever PAD protocol    FAMILY  - Updates: updated at bedside   - Inter-disciplinary family meet or Palliative Care meeting due by: 5/27   Simonne Martinet ACNP-BC [facility] Pulmonary/Critical Care Pager # [phone] OR # [phone] if no answer  2016-05-26, 2:16 PM

## 2016-04-27 NOTE — Progress Notes (Signed)
ABG obtained as post intubation ABG.  Results given to MD and ventilator adjustments made per MD.     Ref. Range 2016-05-22 13:59  Sample type Unknown ARTERIAL  pH, Arterial Latest Ref Range: 7.350-7.450  7.164 (LL)  pCO2 arterial Latest Ref Range: 35.0-45.0 mmHg 44.7  pO2, Arterial Latest Ref Range: 80.0-100.0 mmHg 129.0 (H)  Bicarbonate Latest Ref Range: 20.0-24.0 mEq/L 16.4 (L)  TCO2 Latest Ref Range: 0-100 mmol/L 18  Acid-base deficit Latest Ref Range: 0.0-2.0 mmol/L 12.0 (H)  O2 Saturation Latest Units: % 98.0  Patient temperature Unknown 96.4 F  Collection site Unknown RADIAL, ALLEN'S T.Marland KitchenMarland Kitchen

## 2016-04-27 NOTE — Code Documentation (Signed)
Pulse check. Palpable pulse. Good cardiac motion with ultrasound machine

## 2016-04-27 NOTE — ED Notes (Signed)
Pulses returned

## 2016-04-27 NOTE — Progress Notes (Signed)
ANTICOAGULATION CONSULT NOTE - Initial Consult  Pharmacy Consult for warfarin Indication: atrial fibrillation  Allergies  Allergen Reactions  . Butrans [Buprenorphine] Shortness Of Breath  . Influenza Vaccines Other (See Comments)    Severe coughing for weeks after vaccine; also had flu like symptoms for about 7 to 8 weeks.   . Lanoxin [Digoxin] Other (See Comments)    Nausea, anorexia, caused tremendous weight loss  . Lisinopril Other (See Comments)    cough  . Clarithromycin Other (See Comments)    Unknown, thinks "blisters in mouth"  . Statins Other (See Comments)    unknown    Patient Measurements: Height: 6' (182.9 cm) Weight: 202 lb 9.6 oz (91.9 kg) IBW/kg (Calculated) : 77.6  Vital Signs: Temp: 96.4 F (35.8 C) (05/15 1220) Temp Source: Rectal (05/15 1220) BP: 109/79 mmHg (05/15 1450) Pulse Rate: 86 (05/15 1450)  Labs:  Recent Labs  2016/05/24 1143  HGB 14.8  HCT 46.8  PLT 171  APTT 30  LABPROT 23.4*  INR 2.10*  CREATININE 1.93*    Estimated Creatinine Clearance: 35.7 mL/min (by C-G formula based on Cr of 1.93).   Medical History: Past Medical History  Diagnosis Date  . MI (myocardial infarction) (HCC)     "10/14/2014 I was told I'd had 2 MI's & didn't even know it" (11/27/2014)  . Coronary artery disease involving native coronary artery     a. CABG 1998. b. MYOVIEW 11/2015: Large, Severe FIXED Defect in Cx distribution +/- dLAD.  SCAR w/o Ishcemia.   . 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 NSTEMI 02/2013: Progression of dz in SVG-RPDA followed by severe stenosis in grafted vessel, not likely safe for PCI - med rx. b. NSTEMI 10/2014: patent LIMA-LAD and SVG to OM, TO SVG-PDA with TO PDA and 60% proximal RCA, some left =>PDA collaterals; culprit was likely occlusion of SVG-PDA though this may have been subacute given some collaterals.  . Chronic chest pain   . Cardiomyopathy, ischemic 1998    s/p ICD; Intolerant of Ranexa.   . Chronic combined systolic and diastolic CHF, NYHA class 3 (HCC)     a. EF prev 25%, improved to 45-50% in 2014. b. 10/2014: EF back downt o 25%.  . Chronic atrial fibrillation (HCC)     Rate controlled - Now with ICD/PPM; On Coumadin  . Digitalis toxicity   . Hyperkalemia     a. Spironolactone d/c'd due to this.  [name] LBBB (left bundle branch block)   . Moderate mitral regurgitation by prior echocardiogram 10/2014 echo  . CKD (chronic kidney disease), stage III   . Hyperlipidemia LDL goal <70   . Moderate mitral regurgitation 10/2014 echo  . Type II diabetes mellitus (HCC)   . Rheumatoid arthritis (HCC)   . Long term current use of systemic steroids     for RA  . Hypothyroidism   . 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 Pleuritis, purulent (HCC)     h/o------ with negative vats biopsy; 1994  . 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 Spinal stenosis     Very limited mobility - almost wheelchair bound  . Hip pain     a. adductor muscle tears on prior MRI.  [name] GERD (gastroesophageal reflux disease)     Assessment: 70 yom with history of afib on warfarin pta. Admitted  s/p PEA arrest. Pharmacy consulted to dose warfarin inpatient. INR 2.1 on admit, CBC wnl, no bleed documented.  PTA dose: 5mg  on MWF, 7.5mg  TTSS  Goal of Therapy:  INR 2-3 Monitor platelets by anticoagulation protocol: Yes   Plan:  Warfarin 5mg  x 1 dose tonight Daily INR Monitor s/sx bleeding   , PharmD, BCPS Clinical Pharmacist Pager [phone] 04/24/2016 3:10 PM

## 2016-04-27 NOTE — Code Documentation (Signed)
Pulse check- PEA

## 2016-04-27 NOTE — ED Provider Notes (Signed)
I saw and evaluated the patient, reviewed the resident's note and I agree with the findings and plan.  75 yo M w/ worsening dyspnea, hypothermia, hypotension. Appeared unwell on initial exam and prgoressively worsened. Decreased responsiveness leading to CPR (refer to code sheet) and intubation. i was present for and directly supervised intubation. Seemed to septic shock as cause so started on abx and fluids. Multiple goals of care discussions with family and still wanted aggressive treatment so patient started on pressors. Ccm consulted and will admit. cardiology consulted and will consult.   Cardiopulmonary Resuscitation (CPR) Procedure Note Directed/Performed by: Marily Memos I personally directed ancillary staff and/or performed CPR in an effort to regain return of spontaneous circulation and to maintain cardiac, neuro and systemic perfusion.   CRITICAL CARE Performed by: Marily Memos  Total critical care time: 75 minutes Critical care time was exclusive of separately billable procedures and treating other patients. Critical care was necessary to treat or prevent imminent or life-threatening deterioration. Critical care was time spent personally by me on the following activities: development of treatment plan with patient and/or surrogate as well as nursing, discussions with consultants, evaluation of patient's response to treatment, examination of patient, obtaining history from patient or surrogate, ordering and performing treatments and interventions, ordering and review of laboratory studies, ordering and review of radiographic studies, pulse oximetry and re-evaluation of patient's condition.    EKG Interpretation  Date/Time:  Monday 11-May-2016 11:37:22 EDT Ventricular Rate:  148 PR Interval:    QRS Duration: 138 QT Interval:  334 QTC Calculation: 524 R Axis:   -44 Text Interpretation:   Suspect unspecified pacemaker failure Atrial  fibrillation with rapid ventricular response  with occasional  ventricular-paced complexes Left axis deviation Right bundle branch block  Possible Lateral infarct , age undetermined Inferior infarct , age  undetermined Abnormal ECG Confirmed by [facility] MD, Mercer Peifer [id number]) on  05/11/16 12:45:35 PM      EKG Interpretation  Date/Time:  Monday 05-11-2016 12:37:58 EDT Ventricular Rate:  89 PR Interval:    QRS Duration: 179 QT Interval:  430 QTC Calculation: 523 R Axis:   -46 Text Interpretation:  Atrial fibrillation Right bundle branch block LVH with IVCD and secondary repol abnrm Prolonged QT interval Confirmed by [facility] MD, Abbrielle Batts [phone]) on 05/11/2016 12:46:52 PM        Marily Memos, MD 04/18/2016 2119

## 2016-04-27 NOTE — Code Documentation (Signed)
Pulse check. Pulse palpable, rate 140.

## 2016-04-27 NOTE — Progress Notes (Signed)
eLink Physician-Brief Progress Note Patient Name: [name] DOB: 12/03/1941 MRN: [id number]   Date of Service  04/30/16  HPI/Events of Note  Low BP and tachycardia.  eICU Interventions  [name] added.     Intervention Category Major Interventions: Other:  Edinson Domeier 04-30-16, 10:16 PM

## 2016-04-27 NOTE — ED Provider Notes (Signed)
CSN: [id number]     Arrival date & time 05/27/16  1129 History   First MD Initiated Contact with Patient 05-27-2016 1150     Chief Complaint  Patient presents with  . Shortness of Breath  . Chest Pain     (Consider location/radiation/quality/duration/timing/severity/associated sxs/prior Treatment) Patient is a 75 y.o. male presenting with general illness. The history is provided by the patient and a relative. The history is limited by the condition of the patient.  Illness Location:  Malaise Severity:  Severe Onset quality:  Gradual Duration:  3 days Timing:  Constant Progression:  Worsening Chronicity:  Recurrent Context:  Hx CAD, EF 45%, AFIB, CKD. 3 days of progressive worsening of shortness of breath, malaise. Onset chest pain, pressure-like. Associated symptoms: chest pain, fatigue and shortness of breath   Associated symptoms: no abdominal pain, no cough, no diarrhea, no headaches, no nausea and no vomiting     Past Medical History  Diagnosis Date  . MI (myocardial infarction) (HCC)     "10/14/2014 I was told I'd had 2 MI's & didn't even know it" (11/27/2014)  . Coronary artery disease involving native coronary artery     a. CABG 1998. b. MYOVIEW 11/2015: Large, Severe FIXED Defect in Cx distribution +/- dLAD.  SCAR w/o Ishcemia.   . 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 NSTEMI 02/2013: Progression of dz in SVG-RPDA followed by severe stenosis in grafted vessel, not likely safe for PCI - med rx. b. NSTEMI 10/2014: patent LIMA-LAD and SVG to OM, TO SVG-PDA with TO PDA and 60% proximal RCA, some left =>PDA collaterals; culprit was likely occlusion of SVG-PDA though this may have been subacute given some collaterals.  . Chronic chest pain   . Cardiomyopathy, ischemic 1998    s/p ICD; Intolerant of Ranexa.  . Chronic combined systolic and diastolic CHF, NYHA class 3 (HCC)     a. EF prev 25%, improved to 45-50% in 2014. b. 10/2014: EF back downt o  25%.  . Chronic atrial fibrillation (HCC)     Rate controlled - Now with ICD/PPM; On Coumadin  . Digitalis toxicity   . Hyperkalemia     a. Spironolactone d/c'd due to this.  [name] LBBB (left bundle branch block)   . Moderate mitral regurgitation by prior echocardiogram 10/2014 echo  . CKD (chronic kidney disease), stage III   . Hyperlipidemia LDL goal <70   . Moderate mitral regurgitation 10/2014 echo  . Type II diabetes mellitus (HCC)   . Rheumatoid arthritis (HCC)   . Long term current use of systemic steroids     for RA  . Hypothyroidism   . 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 Pleuritis, purulent (HCC)     h/o------ with negative vats biopsy; 1994  . 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 Spinal stenosis     Very limited mobility - almost wheelchair bound  . Hip pain     a. adductor muscle tears on prior MRI.  [name] GERD (gastroesophageal reflux disease)    Past Surgical History  Procedure Laterality Date  . Lung surgery  1994    "infection in lungs; scraped it out on right; 6 wks later they did left; clipped left diaphragm & paralyzed it"  . Total knee arthroplasty Bilateral 2000's  . Carpal tunnel release Bilateral 1990's  .  Cataract extraction w/ intraocular lens  implant, bilateral Bilateral 1990's  . Cardiac catheterization Left 01/11/2013    Severe CAD with notable progression of disease in the SVG-RPDA followed by severe stenosis in grafted vessel, region not safely "reachede" for PCI, moderate ischemic CM no notable chage in EF, continue medical therapy  . Cardiac catheterization  04/03/2011    Left circumflex 95% stenosis, essentially unchanged from last cath  . Cardiac catheterization  04/01/2010    LAD 99% proximal stenosis with vague flow, circumflex 80% stenosis, essentially unchanged form last cath  . Cardiac catheterization  03/28/2010    Mild  pulmonary hypertension, medical therapy  . Cardiac catheterization  04/30/2009    LAD 95% stenosis, essentially unchanged fomr last cath, medical therapy  . Cardiac catheterization  12/05/2008    2.5x12 apex, predilation in the distal PLA branch, 3x23 XIENCE V durg eluting stent and post dilated with a 3.25x20 [place] VOYAGER at 15 atmospheres  . Left heart catheterization with coronary/graft angiogram N/A 01/13/2013    Procedure: LEFT HEART CATHETERIZATION WITH Isabel Caprice;  Surgeon: Marykay Lex, MD;  Location: [facility] CATH LAB;  Service: Cardiovascular;  Laterality: N/A;  . Left and right heart catheterization with coronary/graft angiogram N/A 10/16/2014    Procedure: LEFT AND RIGHT HEART CATHETERIZATION WITH Isabel Caprice;  Surgeon: Marykay Lex, MD;  Location: [facility] CATH LAB;  Service: Cardiovascular;  Laterality: N/A;  . Coronary artery bypass graft  05/1997    "CABG X7"  . Patella fracture surgery Right "before replacement"  . Knee arthroscopy Left "before replacement  . Ep implantable device N/A 05/06/2015    Procedure: BiV ICD Insertion CRT-D;  Surgeon: Marinus Maw, MD;  Location: [facility] INVASIVE CV LAB;  Service: Cardiovascular;  Laterality: N/A;  . Nm myoview ltd  11/2015     Large, Severe FIXED Defect in Cx distribution +/- dLAD.  SCAR w/o Ishcemia.   . Cardiac catheterization N/A 03/02/2016    Procedure: Left Heart Cath and Cors/Grafts Angiography;  Surgeon: Runell Gess, MD;  Location: [facility] INVASIVE CV LAB;  Service: Cardiovascular;  Laterality: N/A;   Family History  Problem Relation Age of Onset  . Heart disease Mother   . Heart disease Father   . Heart disease Brother   . Heart disease Maternal Aunt   . Cancer     Social History  Substance Use Topics  . Smoking status: Never Smoker   . Smokeless tobacco: Never Used  . Alcohol Use: No    Review of Systems  Constitutional: Positive for chills and fatigue.  Respiratory: Positive for shortness of breath.  Negative for cough.   Cardiovascular: Positive for chest pain and leg swelling.  Gastrointestinal: Negative for nausea, vomiting, abdominal pain and diarrhea.  Neurological: Negative for light-headedness and headaches.  All other systems reviewed and are negative.     Allergies  Butrans; Influenza vaccines; Lanoxin; Lisinopril; Clarithromycin; and Statins  Home Medications   Prior to Admission medications   Medication Sig Start Date End Date Taking? Authorizing Provider  atorvastatin (LIPITOR) 40 MG tablet Take 40 mg by mouth daily. 09/09/15  Yes Historical Provider, MD  fluticasone (FLONASE) 50 MCG/ACT nasal spray Place 2 sprays into both nostrils daily.    Yes Historical Provider, MD  hydrALAZINE (APRESOLINE) 25 MG tablet Take 1.5 tablets (37.5 mg total) by mouth 3 (three) times daily. 01/23/16  Yes Laurey Morale, MD  insulin lispro (HUMALOG) 100 UNIT/ML injection Inject 10 Units into the skin 2 (two) times  daily before a meal.    Yes Historical Provider, MD  isosorbide mononitrate (IMDUR) 60 MG 24 hr tablet Take 2 tablets (120 mg total) by mouth daily. 01/23/16  Yes Laurey Morale, MD  levothyroxine (SYNTHROID, LEVOTHROID) 175 MCG tablet Take 175 mcg by mouth daily before breakfast.   Yes Historical Provider, MD  metoprolol succinate (TOPROL-XL) 100 MG 24 hr tablet TAKE 1 & 1/2 TABLETS BY MOUTH TWICE A DAY 11/26/15  Yes Amber Caryl Bis, NP  nitroGLYCERIN (NITROSTAT) 0.4 MG SL tablet PLACE 1 TABLET UNDER THE TONGUE EVERY 5 MINS AS NEEDED FOR CHEST PAIN 05/01/2016  Yes Runell Gess, MD  nystatin cream (MYCOSTATIN) Apply 1 application topically daily as needed for dry skin.  03/19/15  Yes Historical Provider, MD  Omega-3 Fatty Acids (FISH OIL) 1000 MG CAPS Take 3,000 mg by mouth every morning.   Yes Historical Provider, MD  omeprazole (PRILOSEC) 20 MG capsule Take 20 mg by mouth daily.   Yes Historical Provider, MD  oxyCODONE-acetaminophen (PERCOCET/ROXICET) 5-325 MG per tablet Take 1-2  tablets by mouth every 4 (four) hours as needed for severe pain. Reported on 12/03/2015   Yes Historical Provider, MD  polyethylene glycol (MIRALAX / GLYCOLAX) packet Take 17 g by mouth daily as needed. Patient taking differently: Take 17 g by mouth daily as needed (CONSTIPATION).  11/30/14  Yes Rhonda G Barrett, PA-C  predniSONE (DELTASONE) 5 MG tablet Take 1 tablet by mouth 2 (two) times daily. 06/04/15  Yes Historical Provider, MD  ranolazine (RANEXA) 1000 MG SR tablet Take 1 tablet (1,000 mg total) by mouth 2 (two) times daily. 03/30/16  Yes Runell Gess, MD  torsemide (DEMADEX) 20 MG tablet Take 1 tablet (20 mg total) by mouth daily. 03/23/16  Yes Laurey Morale, MD  TOUJEO SOLOSTAR 300 UNIT/ML SOPN Inject 32 Units into the skin every morning.  02/11/15  Yes Historical Provider, MD  triamcinolone cream (KENALOG) 0.1 % Apply 1 application topically daily as needed (for rash).  02/20/13  Yes Historical Provider, MD  warfarin (COUMADIN) 5 MG tablet ON 3/22 take 10 mg then resume home regimen 3/23. Patient taking differently: Take 5-7.5 mg by mouth See admin instructions. Pt takes 5mg  on MWF, takes 7.5mg  Tu, Th, Sa, Su 03/04/16  Yes Amy D Clegg, NP   BP 104/89 mmHg  Pulse 73  Temp(Src) 96.4 F (35.8 C) (Rectal)  Resp 20  Ht 6' (1.829 m)  Wt 91.9 kg  BMI 27.47 kg/m2  SpO2 96% Physical Exam  Constitutional: He appears lethargic. He appears toxic. He appears ill. He appears distressed.  HENT:  Head: Normocephalic and atraumatic.  Eyes: EOM are normal. Pupils are equal, round, and reactive to light.  Cardiovascular: Intact distal pulses.  An irregularly irregular rhythm present. Tachycardia present.   Pulmonary/Chest: Accessory muscle usage present. Tachypnea noted. He has rhonchi.  Abdominal: Soft. There is no tenderness.  Musculoskeletal:       Right lower leg: He exhibits swelling and edema.       Left lower leg: He exhibits swelling and edema.  Neurological: He appears lethargic.   Skin: Skin is warm and dry.    ED Course  .Critical Care Performed by: Lindalou Hose Authorized by: Lindalou Hose Total critical care time: 35 minutes Critical care was necessary to treat or prevent imminent or life-threatening deterioration of the following conditions: respiratory failure, dehydration, cardiac failure and sepsis. Critical care was time spent personally by me on the following activities: discussions with consultants,  obtaining history from patient or surrogate, ordering and review of laboratory studies, pulse oximetry, review of old charts, ventilator management, re-evaluation of patient's condition, ordering and review of radiographic studies, ordering and performing treatments and interventions and examination of patient.  .Intubation Date/Time: 13-May-2016 3:00 PM Performed by: Lindalou Hose Authorized by: Lindalou Hose Consent: The procedure was performed in an emergent situation. Indications: respiratory distress,  respiratory failure,  airway protection and  hypoxemia Intubation method: direct Patient status: paralyzed (RSI) Preoxygenation: BVM Pretreatment medications: fentanyl Sedatives: etomidate Paralytic: succinylcholine Laryngoscope size: Mac 3 Tube size: 7.5 mm Tube type: cuffed Number of attempts: 2 Ventilation between attempts: BVM Cricoid pressure: yes Cords visualized: yes Post-procedure assessment: chest rise and ETCO2 monitor Breath sounds: equal and absent over the epigastrium Cuff inflated: yes ETT to lip: 24 cm Tube secured with: ETT holder Chest x-ray interpreted by me and radiologist. Chest x-ray findings: endotracheal tube in appropriate position Patient tolerance: Patient tolerated the procedure well with no immediate complications   (including critical care time) Labs Review Labs Reviewed  BASIC METABOLIC PANEL - Abnormal; Notable for the following:    Chloride 100 (*)    Glucose, Bld 250 (*)    Creatinine, Ser 1.93 (*)     GFR calc non Af Amer 32 (*)    GFR calc Af Amer 37 (*)    Anion gap 17 (*)    All other components within normal limits  CBC - Abnormal; Notable for the following:    WBC 14.0 (*)    MCV 102.6 (*)    RDW 16.3 (*)    All other components within normal limits  HEPATIC FUNCTION PANEL - Abnormal; Notable for the following:    AST 43 (*)    Total Bilirubin 1.6 (*)    Indirect Bilirubin 1.1 (*)    All other components within normal limits  BRAIN NATRIURETIC PEPTIDE - Abnormal; Notable for the following:    B Natriuretic Peptide 930.3 (*)    All other components within normal limits  PROTIME-INR - Abnormal; Notable for the following:    Prothrombin Time 23.4 (*)    INR 2.10 (*)    All other components within normal limits  I-STAT CG4 LACTIC ACID, ED - Abnormal; Notable for the following:    Lactic Acid, Venous 8.40 (*)    All other components within normal limits  I-STAT ARTERIAL BLOOD GAS, ED - Abnormal; Notable for the following:    pH, Arterial 7.164 (*)    pO2, Arterial 129.0 (*)    Bicarbonate 16.4 (*)    Acid-base deficit 12.0 (*)    All other components within normal limits  CULTURE, BLOOD (ROUTINE X 2)  CULTURE, BLOOD (ROUTINE X 2)  URINE CULTURE  CULTURE, RESPIRATORY (NON-EXPECTORATED)  APTT  URINALYSIS, ROUTINE W REFLEX MICROSCOPIC (NOT AT [facility])  PROCALCITONIN  BASIC METABOLIC PANEL  MAGNESIUM  PHOSPHORUS  TROPONIN I  TROPONIN I  TROPONIN I  CBC  PROTIME-INR  FIBRIN DEGRADATION PROD.([facility] ONLY)  STREP PNEUMONIAE URINARY ANTIGEN  I-STAT TROPOININ, ED  I-STAT CG4 LACTIC ACID, ED    Imaging Review Dg Chest Portable 1 View  13-May-2016  CLINICAL DATA:  Shortness of breath, chest pain. EXAM: PORTABLE CHEST 1 VIEW COMPARISON:  Same day. FINDINGS: Stable cardiomediastinal silhouette. Interval placement of nasogastric tube which is seen entering stomach. Endotracheal tube is seen projected over tracheal air shadow with distal tip 3.5 cm above the carina. Left-sided  pacemaker is unchanged in position. Increased central pulmonary vascular congestion  and probable bilateral perihilar edema is noted. No pneumothorax is noted. Bony thorax is unremarkable. IMPRESSION: Endotracheal and nasogastric tubes in grossly good position. Increased central pulmonary vascular congestion and bilateral perihilar pulmonary edema is noted. Electronically Signed   By: Lupita Raider, M.D.   On: 04/26/2016 14:09   Dg Chest Portable 1 View  04/19/2016  CLINICAL DATA:  Shortness of breath and weakness EXAM: PORTABLE CHEST 1 VIEW COMPARISON:  02/27/2016 FINDINGS: Cardiac shadow is stable. A defibrillator is again seen and stable. The lungs are hypoinflated but clear. No bony abnormality is seen. IMPRESSION: No active disease. Electronically Signed   By: Alcide Clever M.D.   On: 04/26/2016 12:10   I have personally reviewed and evaluated these images and lab results as part of my medical decision-making.   EKG Interpretation   Date/Time:  Monday Apr 27 2016 12:37:58 EDT Ventricular Rate:  89 PR Interval:    QRS Duration: 179 QT Interval:  430 QTC Calculation: 523 R Axis:   -46 Text Interpretation:  Atrial fibrillation Right bundle branch block LVH  with IVCD and secondary repol abnrm Prolonged QT interval Confirmed by  [facility] MD, Barbara Cower [phone]) on 05/13/2016 12:46:52 PM      MDM   Final diagnoses:  Cardiac arrest (HCC)  Sepsis, due to unspecified organism (HCC)  Acute respiratory failure with hypoxia (HCC)  HCAP (healthcare-associated pneumonia)    History of CAD, A. fib, SVT, presenting in severe sepsis with cardiopulmonary arrest requiring CPR and intubation. Symptoms for the last several days. Patient appeared unwell on initial evaluation. Tachycardia and tachypnea with labored breathing. Patient became progressively less responsive. Started to bag-valve-mask the patient is here was not protecting his airway. Pulses were lost. Approximately 6 minutes of CPR, 2 doses of  epinephrine, 2 doses of bicarbonate, one dose of calcium with ROSC. Intubated as above. Given amiodarone. One more episode of CPR, one dose of epinephrine with ROSC. Started on an epinephrine and norepinephrine infusion.  Lactic acidosis to greater than 8. Chest x-ray did not show any evidence of pneumonia; however feel that this is likely secondary to dehydration is just not fluffed out yet. Started on fluid resuscitation. The patient started on HCAP antibiotics.  Spoke with the ICU team. We will admit to their service for further intervention and evaluation.  Lindalou Hose, MD 04/18/2016 1700  Marily Memos, MD 05/08/2016 [phone]

## 2016-04-27 NOTE — Code Documentation (Signed)
Compressions resumed

## 2016-04-27 NOTE — Code Documentation (Signed)
Preparing to intubate. Pt with a positive gag reflex

## 2016-04-27 NOTE — ED Notes (Signed)
Pt wife notified RN that pt needed urinal. RN entered room and found pt labored respirations, decreasing responsiveness. Dr. Clayborne Dana, EDP and Dr. Carolan Clines, resident made aware. RN noticed pt becoming apneic, made physicians aware again. Physicians at bedside. RT at bedside. No palpable pulse. CPR started and continued for 7 minutes. ROSC at 1221.

## 2016-04-27 NOTE — ED Notes (Signed)
Family at bedside. 

## 2016-04-27 NOTE — Telephone Encounter (Signed)
Rx request sent to pharmacy.  

## 2016-04-27 NOTE — ED Notes (Signed)
No response to painful stimuli.

## 2016-04-27 NOTE — Progress Notes (Signed)
CRITICAL VALUE ALERT  Critical value received Calcium 6.1 and Troponin 1.81  Date of notification:  05/09/2016  Time of notification:  1834  Critical value read back: yes  Nurse who received alert:  Cher Nakai  MD notified (1st page):  Ferne Coe  Time of first page:  1834  MD notified (2nd page):  Time of second page:  Responding MD:  [facility] Nurse  Time MD responded:  [phone]

## 2016-04-27 NOTE — ED Notes (Signed)
Cindee Lame, Critical Care NP and family at bedside. Discussing pt condition and plan of care with family.

## 2016-04-27 NOTE — Progress Notes (Signed)
eLink Physician-Brief Progress Note Patient Name: [name] DOB: 08/06/1941 MRN: [id number]   Date of Service  04/14/2016  HPI/Events of Note  Attempted to self extubate.  eICU Interventions  Restraints for 10 hours only.     Intervention Category Major Interventions: Other:  Worth Kober 05/05/2016, 10:50 PM

## 2016-04-27 NOTE — Code Documentation (Signed)
7.5 ett. 24 at lip. Positive color change, ls cta, condensation in the tube.

## 2016-04-27 NOTE — Progress Notes (Signed)
CRITICAL VALUE ALERT  Critical value received:  Hgb 9.4  Date of notification:  05-09-16  Time of notification:  1755  Critical value read back: yes  Nurse who received alert:  Cher Nakai  MD notified (1st page):  Mare Ferrari  Time of first page:  1802  MD notified (2nd page):  Time of second page:  Responding MD:  [facility] Nurse  Time MD responded [phone]

## 2016-04-27 NOTE — ED Notes (Signed)
Family remains at bedside.

## 2016-04-27 NOTE — Code Documentation (Signed)
cpr started

## 2016-04-27 NOTE — Progress Notes (Signed)
A-line attempted by 2 RT's for a total of 3 attempts. RN notified. Pt's blood pressure is stable at this time. RT will continue to monitor.

## 2016-04-27 NOTE — ED Notes (Signed)
Critical Care MD at bedside to talk w/ family.

## 2016-04-27 NOTE — ED Notes (Signed)
Pt reports not feeling well for over a week. Having fatigue, weakness, sob and chest pain. Has non productive cough, denies fever. Pt appears pale and weak, hypotensive at triage.

## 2016-04-27 NOTE — Progress Notes (Signed)
eLink Physician-Brief Progress Note Patient Name: [name] DOB: September 21, 1941 MRN: [id number]   Date of Service  05/09/16  HPI/Events of Note  Hypotension on neo. Levophed makes him have VT ach per RN  eICU Interventions  500cc fluid bolus     Intervention Category Major Interventions: Hypotension - evaluation and management  Solara Goodchild 2016/05/09, 11:56 PM

## 2016-04-28 ENCOUNTER — Other Ambulatory Visit (HOSPITAL_COMMUNITY): Payer: PPO

## 2016-04-28 ENCOUNTER — Inpatient Hospital Stay (HOSPITAL_COMMUNITY): Payer: PPO

## 2016-04-28 LAB — BASIC METABOLIC PANEL
Anion gap: 21 — ABNORMAL HIGH (ref 5–15)
BUN: 23 mg/dL — AB (ref 6–20)
CALCIUM: 8.2 mg/dL — AB (ref 8.9–10.3)
CO2: 14 mmol/L — AB (ref 22–32)
CREATININE: 3.03 mg/dL — AB (ref 0.61–1.24)
Chloride: 100 mmol/L — ABNORMAL LOW (ref 101–111)
GFR calc non Af Amer: 19 mL/min — ABNORMAL LOW (ref >60)
GFR, EST AFRICAN AMERICAN: 22 mL/min — AB (ref >60)
Glucose, Bld: 295 mg/dL — ABNORMAL HIGH (ref 65–99)
Potassium: 5.9 mmol/L — ABNORMAL HIGH (ref 3.5–5.1)
SODIUM: 135 mmol/L (ref 135–145)

## 2016-04-28 LAB — CBC
HEMATOCRIT: 46 % (ref 39.0–52.0)
Hemoglobin: 14.5 g/dL (ref 13.0–17.0)
MCH: 32 pg (ref 26.0–34.0)
MCHC: 30.4 g/dL (ref 30.0–36.0)
MCV: 105 fL — ABNORMAL HIGH (ref 78.0–100.0)
Platelets: 153 10*3/uL (ref 150–400)
RBC: 4.38 MIL/uL (ref 4.22–5.81)
RDW: 16.5 % — AB (ref 11.5–15.5)
WBC: 20.8 10*3/uL — ABNORMAL HIGH (ref 4.0–10.5)

## 2016-04-28 LAB — BLOOD CULTURE ID PANEL (REFLEXED)
Acinetobacter baumannii: NOT DETECTED
CANDIDA GLABRATA: NOT DETECTED
CARBAPENEM RESISTANCE: NOT DETECTED
Candida albicans: NOT DETECTED
Candida krusei: NOT DETECTED
Candida parapsilosis: NOT DETECTED
Candida tropicalis: NOT DETECTED
ENTEROBACTER CLOACAE COMPLEX: NOT DETECTED
ENTEROCOCCUS SPECIES: NOT DETECTED
Enterobacteriaceae species: NOT DETECTED
Escherichia coli: NOT DETECTED
HAEMOPHILUS INFLUENZAE: NOT DETECTED
KLEBSIELLA PNEUMONIAE: NOT DETECTED
Klebsiella oxytoca: NOT DETECTED
LISTERIA MONOCYTOGENES: NOT DETECTED
METHICILLIN RESISTANCE: NOT DETECTED
NEISSERIA MENINGITIDIS: NOT DETECTED
PSEUDOMONAS AERUGINOSA: NOT DETECTED
Proteus species: NOT DETECTED
SERRATIA MARCESCENS: NOT DETECTED
STAPHYLOCOCCUS SPECIES: DETECTED — AB
STREPTOCOCCUS PYOGENES: NOT DETECTED
STREPTOCOCCUS SPECIES: NOT DETECTED
Staphylococcus aureus (BCID): NOT DETECTED
Streptococcus agalactiae: NOT DETECTED
Streptococcus pneumoniae: NOT DETECTED
Vancomycin resistance: NOT DETECTED

## 2016-04-28 LAB — BLOOD GAS, ARTERIAL
Acid-base deficit: 18.9 mmol/L — ABNORMAL HIGH (ref 0.0–2.0)
BICARBONATE: 6.9 meq/L — AB (ref 20.0–24.0)
DRAWN BY: 449561
FIO2: 0.8
O2 SAT: 99.2 %
PATIENT TEMPERATURE: 98.5
PCO2 ART: 15 mmHg — AB (ref 35.0–45.0)
PEEP: 5 cmH2O
PH ART: 7.284 — AB (ref 7.350–7.450)
PO2 ART: 324 mmHg — AB (ref 80.0–100.0)
RATE: 20 resp/min
TCO2: 7.4 mmol/L (ref 0–100)
VT: 630 mL

## 2016-04-28 LAB — CARBOXYHEMOGLOBIN
Carboxyhemoglobin: 0.4 % — ABNORMAL LOW (ref 0.5–1.5)
Methemoglobin: 0.8 % (ref 0.0–1.5)
O2 Saturation: 21.4 %
Total hemoglobin: 15 g/dL (ref 13.5–18.0)

## 2016-04-28 LAB — BRAIN NATRIURETIC PEPTIDE: B NATRIURETIC PEPTIDE 5: 853.4 pg/mL — AB (ref 0.0–100.0)

## 2016-04-28 LAB — MAGNESIUM: MAGNESIUM: 1.8 mg/dL (ref 1.7–2.4)

## 2016-04-28 LAB — PROTIME-INR
INR: 4.99 — ABNORMAL HIGH (ref 0.00–1.49)
PROTHROMBIN TIME: 44.9 s — AB (ref 11.6–15.2)

## 2016-04-28 LAB — TROPONIN I: TROPONIN I: 13.24 ng/mL — AB (ref <0.031)

## 2016-04-28 LAB — PROCALCITONIN: PROCALCITONIN: 7.05 ng/mL

## 2016-04-28 LAB — GLUCOSE, CAPILLARY
GLUCOSE-CAPILLARY: 204 mg/dL — AB (ref 65–99)
Glucose-Capillary: 241 mg/dL — ABNORMAL HIGH (ref 65–99)

## 2016-04-28 LAB — PHOSPHORUS: PHOSPHORUS: 5.5 mg/dL — AB (ref 2.5–4.6)

## 2016-04-28 MED ORDER — SODIUM CHLORIDE 0.9 % IV BOLUS (SEPSIS)
1000.0000 mL | Freq: Once | INTRAVENOUS | Status: AC
  Administered 2016-04-28: 1000 mL via INTRAVENOUS

## 2016-04-28 MED ORDER — MAGNESIUM SULFATE IN D5W 1-5 GM/100ML-% IV SOLN
1.0000 g | Freq: Once | INTRAVENOUS | Status: AC
  Administered 2016-04-28: 1 g via INTRAVENOUS
  Filled 2016-04-28: qty 100

## 2016-04-28 MED ORDER — SODIUM POLYSTYRENE SULFONATE 15 GM/60ML PO SUSP
30.0000 g | Freq: Once | ORAL | Status: AC
  Administered 2016-04-28: 30 g
  Filled 2016-04-28: qty 120

## 2016-04-28 MED ORDER — PHENYLEPHRINE HCL 10 MG/ML IJ SOLN
30.0000 ug/min | INTRAVENOUS | Status: DC
  Administered 2016-04-28 (×2): 200 ug/min via INTRAVENOUS
  Filled 2016-04-28 (×3): qty 4

## 2016-04-28 MED FILL — Medication: Qty: 1 | Status: AC

## 2016-04-30 ENCOUNTER — Telehealth: Payer: Self-pay

## 2016-04-30 LAB — CULTURE, BLOOD (ROUTINE X 2)

## 2016-04-30 NOTE — Telephone Encounter (Signed)
On 04/30/2016 I received a death certificate from [employer] Marolyn [employer]er) (Original). The death certificate is for burial. The patient is a patient of Doctor Byrum. The death certificate will be taken to Pulmonary Unit @ [facility] this pm for signature.  On 05-04-16 I received the death certificate back from Doctor Byrum. I got the death certificate ready and called the funeral home to let them know the death certificate was being mailed to the [facility] Dept per their request.

## 2016-05-05 ENCOUNTER — Encounter (HOSPITAL_COMMUNITY): Payer: PPO

## 2016-05-05 LAB — CULTURE, BLOOD (ROUTINE X 2)

## 2016-05-06 ENCOUNTER — Encounter: Payer: PPO | Admitting: Internal Medicine

## 2016-05-14 NOTE — Progress Notes (Signed)
eLink Physician-Brief Progress Note Patient Name: [name] DOB: [birth year] MRN: [id number]   Date of Service  2016/05/23  HPI/Events of Note   Recent Labs Lab 04/19/2016 1359 04/29/2016 1513 May 23, 2016 0348 23-May-2016 0436  PHART 7.164*  --   --  7.284*  PCO2ART 44.7  --   --  15.0*  PO2ART 129.0*  --   --  324*  HCO3 16.4*  --   --  6.9*  TCO2 18  --   --  7.4  O2SAT 98.0 48.6 21.4 99.2    Recent Labs Lab 04/24/2016 1154 05/13/2016 1439 05/08/2016 1728 2016/05/23 0339  LATICACIDVEN 8.40* 10.33*  --   --   PROCALCITON  --   --  1.23 7.05    Recent Labs Lab 05/02/2016 1143 04/16/2016 1728 04/29/2016 1746 05/23/2016 0339  NA 139  --  142 135  K 4.0  --  3.9 5.9*  CL 100*  --  116* 100*  CO2 22  --  16* 14*  GLUCOSE 250*  --  228* 295*  BUN 15  --  14 23*  CREATININE 1.93*  --  1.70* 3.03*  CALCIUM 9.2  --  6.1* 8.2*  MG  --  1.7  --  1.8  PHOS  --  4.9*  --  5.5*     Recent Labs Lab 04/21/2016 1728 05/09/2016 2028 May 23, 2016 0205  TROPONINI 1.81* 5.75* 13.24*    Intake/Output Summary (Last 24 hours) at 05/23/2016 0500 Last data filed at 05/23/2016 0428  Gross per 24 hour  Intake 5387.06 ml  Output    250 ml  Net 5137.06 ml     eICU Interventions  Improving acidosis Worsening tropponin Low mag  Plan Mag sulfate Recheck lactate       Intervention Category Major Interventions: Acid-Base disturbance - evaluation and management  Windell Musson 05/23/16, 4:59 AM

## 2016-05-14 NOTE — Accreditation Note (Signed)
o Restraints not reported to CMS Pursuant to regulation 482.13 (G) (3) use of soft wrist restraints was logged on 05.16.2017 at 0800 by Rosine Door, RN, Patient Safety and Accreditation.

## 2016-05-14 NOTE — Progress Notes (Signed)
Introduced self to family at bedside, provided emotional support.  Wife asked for some time, family on their way from out of town.  Will reassess situation at a later time.

## 2016-05-14 NOTE — Procedures (Signed)
Extubation Procedure Note  Patient Details:   Name: [name] DOB: [birth year] MRN: [id number]   Airway Documentation:  Airway 7.5 mm (Active)  Secured at (cm) 24 cm 2016-04-29  4:02 AM  Measured From Lips 04-29-2016  4:02 AM  Secured Location Right 04/29/16  4:02 AM  Secured By [employer] Apr 29, 2016  4:02 AM  Tube Holder Repositioned Yes Apr 29, 2016  4:02 AM  Cuff Pressure (cm H2O) 26 cm H2O 05/12/2016  4:00 PM  Site Condition Dry 04/29/16  4:02 AM    Evaluation  O2 sats: post cardiac death Complications: Complications of cardiac death Patient did not tolerate procedure well. Bilateral Breath Sounds: Clear   Complications of cardiac death  Gaetano Hawthorne 2016-04-29, 6:26 AM

## 2016-05-14 NOTE — Progress Notes (Signed)
Placed upper and lower dentures per wife's request.

## 2016-05-14 NOTE — Progress Notes (Addendum)
eLink Physician Progress Note and Electrolyte Replacement  Patient Name: [name] DOB: Apr 08, 1941 MRN: [id number]  Date of Service  May 06, 2016   HPI/Events of Note    Recent Labs Lab 04/14/2016 1143 05/07/2016 1728 04/24/2016 1746 May 06, 2016 0339  NA 139  --  142 135  K 4.0  --  3.9 5.9*  CL 100*  --  116* 100*  CO2 22  --  16* 14*  GLUCOSE 250*  --  228* 295*  BUN 15  --  14 23*  CREATININE 1.93*  --  1.70* 3.03*  CALCIUM 9.2  --  6.1* 8.2*  MG  --  1.7  --  1.8  PHOS  --  4.9*  --  5.5*    Estimated Creatinine Clearance: 26.1 mL/min (by C-G formula based on Cr of 3.03).  Intake/Output      05/15 0701 - 05/16 0700   I.V. (mL/kg) 4557.1 (42.9)   IV Piggyback 700   Total Intake(mL/kg) 5257.1 (49.5)   Urine (mL/kg/hr) 0   Emesis/NG output 250   Total Output 250   Net +5007.1        - I/O DETAILED x 24h    Total I/O In: 2010.9 [I.V.:1310.9; IV Piggyback:700] Out: 0  - I/O THIS SHIFT    ASSESSMENT High k Also hypotensive again - responding to fluids  eICURN Interventions  Kayexalate 30 1L fluid bolus   ASSESSMENT: MAJOR ELECTROLYTE      Dr. Kalman Shan, M.D., [facility].C.P Pulmonary and Critical Care Medicine Staff Physician [facility] [place] [facility] Pager: [phone], If no answer or between  15:00h - 7:00h: call [phone]  2016-05-06 4:39 AM

## 2016-05-14 NOTE — Progress Notes (Signed)
Removed all IV's and foley.  Cleaned patient up, placed in bag.  Transferred patient to morgue

## 2016-05-14 NOTE — Progress Notes (Signed)
Chaplain responded to page from 43M that pt had just passed. A close friend of pt's wife was arriving at same time chaplain arrived. She provided much comfort to pt's wife, who was deeply grieving. Chaplain provided emotional support and empathic listening for pt's son. He talked about his dad and also about his own struggles with state 4 kidney failure. The family said their pastor would be coming soon.

## 2016-05-14 NOTE — Progress Notes (Signed)
Pt asystole at [phone] with pacer spikes still seen on monitor, no pulse and heart tones verified by two nurses, Santo Held and writer.  Family at bedside, Chaplain contacted, and Dr. Marchelle Gearing contacted.

## 2016-05-14 DEATH — deceased

## 2016-06-13 NOTE — Discharge Summary (Signed)
PULMONARY / CRITICAL CARE MEDICINE DEATH SUMMARY   Name: [name] MRN: [id number] DOB: Apr 15, 1941    ADMISSION DATE:  May 22, 2016 CONSULTATION DATE:  05/22/2016 DATE OF DEATH: 2016/05/23  CHIEF COMPLAINT:  Cardiac arrest   FINAL CAUSE OF DEATH Cardiogenic shock  SECONDARY CAUSES OF DEATH Cardiac arrest requiring CPR Possible coexisting septic shock Acute respiratory failure with hypoxemia Acute on chronic systolic CHF Acute lactic acidosis Acute on chronic renal failure Hyperkalemia  Hyperphosphatemia Hypomagnesemia  Coagulopathy due to coumadin and to acute illness Hx Hypertension Chronic diastolic CHF CAD w history CABG NSTEMI Atrial fibrillation Moderate mitral regurgitation L BBB Possible bilateral PNA Rheumatoid arthritis 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 He is essentially bed/chair bound for last 2-3 yrs. Last seen by cardiology 4/6 at which time torsemide was slightly decreased. Was in typical state of health until about a week prior to presentation when wife noted he was having increased LE swelling so as instructed she started to go back up on his diuretics. Presented to emergency department May 23, 2023 with dyspnea, lethargy and chest pain. CXR showed B pulmonary infiltrates concerning for acute on chronic systolic CHF vs component of PNA. He had shock and respiratory failure emergency department which quickly devolved to a PEA arrest. He received 9 minutes CPR, was mechanically ventilated and supported with epi drip. Presumed to have cardiogenic shock, but was started on  empiric antibiotics to cover possible PNA and sepsis as well. Phenylephrine was added due to progression of his shock. Despite all aggressive care he became asystolic early am 05/29/16. Based on his chronic conditions and poor prognosis decision had been made with family to defer any CPR. He expired 05/29/16.    Levy Pupa, MD, PhD 05/20/2016, 1:33 PM [facility] [phone] or if no answer [phone]
